# Patient Record
Sex: Male | Born: 1970 | Race: White | Hispanic: No | Marital: Married | Smoking: Current every day smoker
Health system: Southern US, Community
[De-identification: ages and names within clinical notes are randomized; demographics above are authoritative.]

## PROBLEM LIST (undated history)

## (undated) ENCOUNTER — Ambulatory Visit (HOSPITAL_COMMUNITY): Admission: EM | Payer: 59

## (undated) DIAGNOSIS — K859 Acute pancreatitis without necrosis or infection, unspecified: Secondary | ICD-10-CM

## (undated) DIAGNOSIS — E119 Type 2 diabetes mellitus without complications: Secondary | ICD-10-CM

## (undated) DIAGNOSIS — F1020 Alcohol dependence, uncomplicated: Secondary | ICD-10-CM

## (undated) DIAGNOSIS — J449 Chronic obstructive pulmonary disease, unspecified: Secondary | ICD-10-CM

## (undated) DIAGNOSIS — K219 Gastro-esophageal reflux disease without esophagitis: Secondary | ICD-10-CM

## (undated) DIAGNOSIS — I10 Essential (primary) hypertension: Secondary | ICD-10-CM

## (undated) HISTORY — PX: ABDOMINAL SURGERY: SHX537

---

## 2015-09-15 DIAGNOSIS — F10930 Alcohol use, unspecified with withdrawal, uncomplicated: Secondary | ICD-10-CM | POA: Diagnosis present

## 2015-10-16 DIAGNOSIS — I10 Essential (primary) hypertension: Secondary | ICD-10-CM | POA: Insufficient documentation

## 2015-10-24 DIAGNOSIS — K860 Alcohol-induced chronic pancreatitis: Secondary | ICD-10-CM | POA: Diagnosis present

## 2018-01-01 DIAGNOSIS — F1020 Alcohol dependence, uncomplicated: Secondary | ICD-10-CM | POA: Diagnosis present

## 2018-07-04 DIAGNOSIS — F17200 Nicotine dependence, unspecified, uncomplicated: Secondary | ICD-10-CM | POA: Diagnosis present

## 2018-09-29 ENCOUNTER — Emergency Department: Payer: Self-pay

## 2018-09-29 ENCOUNTER — Encounter: Payer: Self-pay | Admitting: Emergency Medicine

## 2018-09-29 ENCOUNTER — Other Ambulatory Visit: Payer: Self-pay

## 2018-09-29 ENCOUNTER — Emergency Department
Admission: EM | Admit: 2018-09-29 | Discharge: 2018-09-29 | Disposition: A | Discharge status: Home / Self Care | Payer: Self-pay | Attending: Emergency Medicine | Admitting: Emergency Medicine

## 2018-09-29 DIAGNOSIS — F17210 Nicotine dependence, cigarettes, uncomplicated: Secondary | ICD-10-CM | POA: Insufficient documentation

## 2018-09-29 DIAGNOSIS — R109 Unspecified abdominal pain: Secondary | ICD-10-CM | POA: Insufficient documentation

## 2018-09-29 DIAGNOSIS — I10 Essential (primary) hypertension: Secondary | ICD-10-CM | POA: Insufficient documentation

## 2018-09-29 HISTORY — DX: Essential (primary) hypertension: I10

## 2018-09-29 HISTORY — DX: Acute pancreatitis without necrosis or infection, unspecified: K85.90

## 2018-09-29 LAB — URINALYSIS, COMPLETE (UACMP) WITH MICROSCOPIC
Bacteria, UA: NONE SEEN
Bilirubin Urine: NEGATIVE
Glucose, UA: NEGATIVE mg/dL
Hgb urine dipstick: NEGATIVE
Ketones, ur: NEGATIVE mg/dL
Leukocytes,Ua: NEGATIVE
Nitrite: NEGATIVE
Protein, ur: NEGATIVE mg/dL
Specific Gravity, Urine: 1.012 (ref 1.005–1.030)
Squamous Epithelial / HPF: NONE SEEN (ref 0–5)
WBC, UA: NONE SEEN WBC/hpf (ref 0–5)
pH: 5 (ref 5.0–8.0)

## 2018-09-29 LAB — COMPREHENSIVE METABOLIC PANEL
ALT: 142 U/L — ABNORMAL HIGH (ref 0–44)
AST: 156 U/L — ABNORMAL HIGH (ref 15–41)
Albumin: 4.3 g/dL (ref 3.5–5.0)
Alkaline Phosphatase: 73 U/L (ref 38–126)
Anion gap: 9 (ref 5–15)
BUN: 7 mg/dL (ref 6–20)
CO2: 23 mmol/L (ref 22–32)
Calcium: 8.6 mg/dL — ABNORMAL LOW (ref 8.9–10.3)
Chloride: 107 mmol/L (ref 98–111)
Creatinine, Ser: 0.72 mg/dL (ref 0.61–1.24)
GFR calc Af Amer: >60 mL/min (ref >60)
GFR calc non Af Amer: >60 mL/min (ref >60)
Glucose, Bld: 122 mg/dL — ABNORMAL HIGH (ref 70–99)
Potassium: 3.7 mmol/L (ref 3.5–5.1)
Sodium: 139 mmol/L (ref 135–145)
Total Bilirubin: 0.7 mg/dL (ref 0.3–1.2)
Total Protein: 7.8 g/dL (ref 6.5–8.1)

## 2018-09-29 LAB — CBC
HCT: 48.2 % (ref 39.0–52.0)
Hemoglobin: 16.8 g/dL (ref 13.0–17.0)
MCH: 31.3 pg (ref 26.0–34.0)
MCHC: 34.9 g/dL (ref 30.0–36.0)
MCV: 89.9 fL (ref 80.0–100.0)
Platelets: 166 10*3/uL (ref 150–400)
RBC: 5.36 MIL/uL (ref 4.22–5.81)
RDW: 15.6 % — ABNORMAL HIGH (ref 11.5–15.5)
WBC: 5.1 10*3/uL (ref 4.0–10.5)
nRBC: 0 % (ref 0.0–0.2)

## 2018-09-29 LAB — LIPASE, BLOOD: Lipase: 49 U/L (ref 11–51)

## 2018-09-29 MED ORDER — IOHEXOL 240 MG/ML SOLN
50.0000 mL | Freq: Once | INTRAMUSCULAR | Status: AC | PRN
  Administered 2018-09-29: 18:00:00 50 mL via ORAL

## 2018-09-29 MED ORDER — HYDROMORPHONE HCL 1 MG/ML IJ SOLN
0.5000 mg | Freq: Once | INTRAMUSCULAR | Status: AC
  Administered 2018-09-29: 20:00:00 0.5 mg via INTRAVENOUS

## 2018-09-29 MED ORDER — HYDROMORPHONE HCL 1 MG/ML IJ SOLN
0.5000 mg | Freq: Once | INTRAMUSCULAR | Status: AC
  Administered 2018-09-29: 20:00:00 0.5 mg via INTRAVENOUS
  Filled 2018-09-29: qty 1

## 2018-09-29 MED ORDER — MORPHINE SULFATE (PF) 4 MG/ML IV SOLN
4.0000 mg | Freq: Once | INTRAVENOUS | Status: AC
  Administered 2018-09-29: 4 mg via INTRAVENOUS
  Filled 2018-09-29: qty 1

## 2018-09-29 MED ORDER — IOHEXOL 300 MG/ML  SOLN
100.0000 mL | Freq: Once | INTRAMUSCULAR | Status: AC | PRN
  Administered 2018-09-29: 19:00:00 100 mL via INTRAVENOUS

## 2018-09-29 MED ORDER — HYDROMORPHONE HCL 1 MG/ML IJ SOLN
INTRAMUSCULAR | Status: AC
  Administered 2018-09-29: 20:00:00 0.5 mg via INTRAVENOUS
  Filled 2018-09-29: qty 1

## 2018-09-29 MED ORDER — SODIUM CHLORIDE 0.9 % IV BOLUS
1000.0000 mL | Freq: Once | INTRAVENOUS | Status: AC
  Administered 2018-09-29: 20:00:00 1000 mL via INTRAVENOUS

## 2018-09-29 MED ORDER — OXYCODONE-ACETAMINOPHEN 5-325 MG PO TABS: 1.0000 | ORAL_TABLET | ORAL | 0 refills | Status: DC | PRN

## 2018-09-29 MED ORDER — ONDANSETRON HCL 4 MG/2ML IJ SOLN
4.0000 mg | Freq: Once | INTRAMUSCULAR | Status: AC
  Administered 2018-09-29: 18:00:00 4 mg via INTRAVENOUS
  Filled 2018-09-29: qty 2

## 2018-09-29 NOTE — ED Triage Notes (Signed)
Pt in via EMS from home with c/o syncopal episode with diaphoresis. Pt also with LUQ abd pain and hx of pancreatitis. EMS reports pt is on complaint with his home mediations. NKDA

## 2018-09-29 NOTE — ED Triage Notes (Addendum)
Patient presents to the ED with abdominal pian x 2 days.  Patient reports vomiting x 4 and diarrhea x 3 in the past 24 hours.  Patient reports syncopal episode today.  Patient reports history of pancreatitis and reports drinking alcohol over the weekend.  When asked how much alcohol patient had patient states, "More than I should have, probably."

## 2018-09-29 NOTE — Discharge Instructions (Addendum)
Please return for worse pain, fever or vomiting.  Also return if you have frequent diarrhea or especially if you have bloody diarrhea.  Please try to get a primary care doctor.  You can follow-up with the [facility], the [facility], the [facility] clinic, [facility] or [facility].  [facility] is free if you qualify.  All of these clinics have a lot of paperwork to fill out and they often have long waits.  There is also a charity care clinic at [facility] that you can go and get into his lungs are state resident and [facility] has a residents clinic with the doctors in training are completely supervised by fully trained doctors but will see you for reduced cost.  I have given you some Percocet to take if needed for pain 1 3 times a day.  I only gave you 3 because if you get worse I want you to come back.

## 2018-09-29 NOTE — ED Provider Notes (Signed)
[facility] Emergency Department Provider Note   ____________________________________________   First MD Initiated Contact with Patient 09/29/18 1753     (approximate)  I have reviewed the triage vital signs and the nursing notes.   HISTORY  Chief Complaint Abdominal Pain   HPI Kristopher [name] is a 48 y.o. male who reports about 2 days of abdominal pain.  He has had some episodes of vomiting and diarrhea.  He told the nurse it was 4 episodes of vomiting and 3 of diarrhea in the last 24 hours.  He is complaining of a lot of abdominal pain.  He says he has been drinking a lot and has pancreatitis and was afraid that he had pancreatitis again.  He does drink every day.  He had been dry for some time but recently started back up again.  He is only been drinking for 3 weeks this time.  He says when he has withdrawal he has bad shakes but really does not have DTs or seizures.  Pain is deep and achy.  Somewhat worse with palpation.  Patient reports he started his antihypertensive medicine which she thinks maybe Maxide.  He started it this morning.  A he had left it with his brother-in-law I believe previously.        Past Medical History:  Diagnosis Date  . Hypertension   . Pancreatitis     There are no active problems to display for this patient.   History reviewed. No pertinent surgical history.  Prior to Admission medications   Medication Sig Start Date End Date Taking? Authorizing Provider  oxyCODONE-acetaminophen (PERCOCET) 5-325 MG tablet Take 1 tablet by mouth every 4 (four) hours as needed for severe pain. 09/29/18 09/29/19  Arnaldo NatalMalinda, Paul F, MD    Allergies Patient has no known allergies.  No family history on file.  Social History Social History   Tobacco Use  . Smoking status: Current Every Day Smoker    Packs/day: 0.50    Types: Cigarettes  . Smokeless tobacco: Never Used  Substance Use Topics  . Alcohol use: Yes  . Drug use: Not on  file    Review of Systems  Constitutional: No fever/chills Eyes: No visual changes. ENT: No sore throat. Cardiovascular: Denies chest pain. Respiratory: Denies shortness of breath. Gastrointestinal: See HPI Genitourinary: Negative for dysuria. Musculoskeletal: Negative for back pain. Skin: Negative for rash. Neurological: Negative for headaches, focal weakness   ____________________________________________   PHYSICAL EXAM:  VITAL SIGNS: ED Triage Vitals  Enc Vitals Group     BP 09/29/18 1442 115/75     Pulse Rate 09/29/18 1442 91     Resp 09/29/18 1442 16     Temp 09/29/18 1442 98.5 F (36.9 C)     Temp Source 09/29/18 1442 Oral     SpO2 09/29/18 1442 93 %     Weight 09/29/18 1443 240 lb (108.9 kg)     Height 09/29/18 1443 5\' 11"  (1.803 m)     Head Circumference --      Peak Flow --      Pain Score 09/29/18 1443 10     Pain Loc --      Pain Edu? --      Excl. in GC? --     Constitutional: Alert and oriented. Well appearing and in no acute distress. Eyes: Conjunctivae are normal.  Head: Atraumatic. Nose: No congestion/rhinnorhea. Mouth/Throat: Mucous membranes are moist.  Oropharynx non-erythematous. Neck: No stridor Cardiovascular: Normal rate, regular rhythm.  Grossly normal heart sounds.  Good peripheral circulation. Respiratory: Normal respiratory effort.  No retractions. Lungs CTAB. Gastrointestinal: Somewhat distended, firm, diffusely mildly tender. No abdominal bruits. No CVA tenderness. Musculoskeletal: No lower extremity tenderness nor edema.  Neurologic:  Normal speech and language. No gross focal neurologic deficits are appreciated.  Skin:  Skin is warm, dry and intact. No rash noted patient is diffusely flushed though.   ____________________________________________   LABS (all labs ordered are listed, but only abnormal results are displayed)  Labs Reviewed  COMPREHENSIVE METABOLIC PANEL - Abnormal; Notable for the following components:       Result Value   Glucose, Bld 122 (*)    Calcium 8.6 (*)    AST 156 (*)    ALT 142 (*)    All other components within normal limits  CBC - Abnormal; Notable for the following components:   RDW 15.6 (*)    All other components within normal limits  URINALYSIS, COMPLETE (UACMP) WITH MICROSCOPIC - Abnormal; Notable for the following components:   Color, Urine YELLOW (*)    APPearance CLEAR (*)    All other components within normal limits  GASTROINTESTINAL PANEL BY PCR, STOOL (REPLACES STOOL CULTURE)  C DIFFICILE QUICK SCREEN W PCR REFLEX  LIPASE, BLOOD   ____________________________________________  EKG  ____________________________________________  RADIOLOGY  ED MD interpretation: CT read by radiology reviewed by me does not show any acute problems  Official radiology report(s): Ct Abdomen Pelvis W Contrast  Result Date: 09/29/2018 CLINICAL DATA:  Abdominal pain with vomiting and diarrhea EXAM: CT ABDOMEN AND PELVIS WITH CONTRAST TECHNIQUE: Multidetector CT imaging of the abdomen and pelvis was performed using the standard protocol following bolus administration of intravenous contrast. Oral contrast was also administered. 1 CONTRAST:  131mL OMNIPAQUE IOHEXOL 300 MG/ML SOLN, 15mL OMNIPAQUE IOHEXOL 240 MG/ML SOLN COMPARISON:  None. FINDINGS: Lower chest: Lung bases are clear. Hepatobiliary: There is diffuse decreased attenuation in the liver consistent with hepatic steatosis. No focal liver lesions are evident. Gallbladder wall is not appreciably thickened. There is no biliary duct dilatation. Pancreas: There is no pancreatic mass or inflammatory focus. Spleen: No splenic lesions are evident. Adrenals/Urinary Tract: Adrenals bilaterally appear unremarkable. Kidneys bilaterally show no evident mass or hydronephrosis on either side. There is no evident renal or ureteral calculus on either side. Urinary bladder is midline with wall thickness within normal limits. Stomach/Bowel: There is no  appreciable bowel wall or mesenteric thickening. No evident bowel obstruction. Terminal ileum appears unremarkable. There is no free air or portal venous air. Vascular/Lymphatic: There is no abdominal aortic aneurysm. No vascular lesions are demonstrable. No adenopathy is appreciable in the abdomen or pelvis. Reproductive: Prostate and seminal vesicles are normal in size and contour. No evident pelvic mass. Other: Appendix appears normal. No abscess or ascites evident in the abdomen or pelvis. Musculoskeletal: There are no appreciable blastic or lytic bone lesions. There is no evident intramuscular lesion. There is thinning of the rectus muscle at the umbilical level. IMPRESSION: 1.  Diffuse hepatic steatosis. 2. No evident bowel obstruction. No abscess in the abdomen or pelvis. Appendix appears normal. 3. No evident renal or ureteral calculus. No hydronephrosis. Urinary bladder wall thickness normal. Comment: A cause for patient's symptoms has not been established with this study. Electronically Signed   By: Lowella Grip III M.D.   On: 09/29/2018 18:56    ____________________________________________   PROCEDURES  Procedure(s) performed (including Critical Care):  Procedures   ____________________________________________   INITIAL IMPRESSION / ASSESSMENT AND  PLAN / ED COURSE  I cannot find any cause for the patient's abdominal pain.  He is anxious to go home before an apparent oncoming storm breaks.  We will let him go.  He knows to return if he is worse.  He will be given a prescription for 3 Percocets if he needs it.Kristopher [name] was evaluated in Emergency Department on 09/29/2018 for the symptoms described in the history of present illness. He was evaluated in the context of the global COVID-19 pandemic, which necessitated consideration that the patient might be at risk for infection with the SARS-CoV-2 virus that causes COVID-19. Institutional protocols and algorithms that pertain to the  evaluation of patients at risk for COVID-19 are in a state of rapid change based on information released by regulatory bodies including the CDC and federal and state organizations. These policies and algorithms were followed during the patient's care in the ED.    He is going to [facility] already.           ____________________________________________   FINAL CLINICAL IMPRESSION(S) / ED DIAGNOSES  Final diagnoses:  Abdominal pain, unspecified abdominal location     ED Discharge Orders         Ordered    oxyCODONE-acetaminophen (PERCOCET) 5-325 MG tablet  Every 4 hours PRN     09/29/18 2100           Note:  This document was prepared using Dragon voice recognition software and may include unintentional dictation errors.    Arnaldo NatalMalinda, Paul F, MD 09/29/18 2111

## 2018-10-27 ENCOUNTER — Encounter: Payer: Self-pay | Admitting: Emergency Medicine

## 2018-10-27 ENCOUNTER — Other Ambulatory Visit: Payer: Self-pay

## 2018-10-27 DIAGNOSIS — R1110 Vomiting, unspecified: Secondary | ICD-10-CM | POA: Insufficient documentation

## 2018-10-27 DIAGNOSIS — Z5321 Procedure and treatment not carried out due to patient leaving prior to being seen by health care provider: Secondary | ICD-10-CM | POA: Insufficient documentation

## 2018-10-27 LAB — URINALYSIS, COMPLETE (UACMP) WITH MICROSCOPIC
Bacteria, UA: NONE SEEN
Bilirubin Urine: NEGATIVE
Glucose, UA: NEGATIVE mg/dL
Ketones, ur: NEGATIVE mg/dL
Nitrite: NEGATIVE
Protein, ur: NEGATIVE mg/dL
Specific Gravity, Urine: 1.001 — ABNORMAL LOW (ref 1.005–1.030)
Squamous Epithelial / HPF: NONE SEEN (ref 0–5)
pH: 6 (ref 5.0–8.0)

## 2018-10-27 LAB — CBC
HCT: 48.5 % (ref 39.0–52.0)
Hemoglobin: 16.5 g/dL (ref 13.0–17.0)
MCH: 31.8 pg (ref 26.0–34.0)
MCHC: 34 g/dL (ref 30.0–36.0)
MCV: 93.4 fL (ref 80.0–100.0)
Platelets: 228 10*3/uL (ref 150–400)
RBC: 5.19 MIL/uL (ref 4.22–5.81)
RDW: 14.9 % (ref 11.5–15.5)
WBC: 6.6 10*3/uL (ref 4.0–10.5)
nRBC: 0 % (ref 0.0–0.2)

## 2018-10-27 LAB — COMPREHENSIVE METABOLIC PANEL
ALT: 131 U/L — ABNORMAL HIGH (ref 0–44)
AST: 110 U/L — ABNORMAL HIGH (ref 15–41)
Albumin: 4.3 g/dL (ref 3.5–5.0)
Alkaline Phosphatase: 67 U/L (ref 38–126)
Anion gap: 11 (ref 5–15)
BUN: 12 mg/dL (ref 6–20)
CO2: 23 mmol/L (ref 22–32)
Calcium: 8.8 mg/dL — ABNORMAL LOW (ref 8.9–10.3)
Chloride: 104 mmol/L (ref 98–111)
Creatinine, Ser: 0.75 mg/dL (ref 0.61–1.24)
GFR calc Af Amer: >60 mL/min (ref >60)
GFR calc non Af Amer: >60 mL/min (ref >60)
Glucose, Bld: 110 mg/dL — ABNORMAL HIGH (ref 70–99)
Potassium: 3.7 mmol/L (ref 3.5–5.1)
Sodium: 138 mmol/L (ref 135–145)
Total Bilirubin: 0.5 mg/dL (ref 0.3–1.2)
Total Protein: 7.4 g/dL (ref 6.5–8.1)

## 2018-10-27 LAB — LIPASE, BLOOD: Lipase: 60 U/L — ABNORMAL HIGH (ref 11–51)

## 2018-10-27 NOTE — ED Triage Notes (Addendum)
Pt presents to ED via EMS with upper abd pain that radiates around to his back for the past 5 hours. Pt states "his  Pancreas is on fire". Hx of the same. +Vomiting

## 2018-10-28 ENCOUNTER — Emergency Department
Admission: EM | Admit: 2018-10-28 | Discharge: 2018-10-28 | Discharge status: Against Medical Advice | Payer: Self-pay | Attending: Emergency Medicine | Admitting: Emergency Medicine

## 2018-10-28 ENCOUNTER — Telehealth: Payer: Self-pay | Admitting: Emergency Medicine

## 2018-10-28 HISTORY — DX: Gastro-esophageal reflux disease without esophagitis: K21.9

## 2018-10-28 LAB — TROPONIN I (HIGH SENSITIVITY): Troponin I (High Sensitivity): 4 ng/L (ref <18)

## 2018-10-28 NOTE — ED Notes (Signed)
Pt noted leaving ED lobby, getting into vehicle and leaving  

## 2018-10-28 NOTE — Telephone Encounter (Signed)
Called patient due to lwot to inquire about condition and follow up plans. Says he continues to have pain.  He does not have pcp, but he does have [facility] care at [facility].  He also said that he has black stools with blood when he wipes. I stresed the importance of provider exam to determine cause of abdominal pain.  He says he will come back or go to [facility] [place] after he speaks with his wife.

## 2018-10-29 ENCOUNTER — Emergency Department
Admission: EM | Admit: 2018-10-29 | Discharge: 2018-10-29 | Disposition: A | Discharge status: Home / Self Care | Payer: Self-pay | Attending: Emergency Medicine | Admitting: Emergency Medicine

## 2018-10-29 ENCOUNTER — Other Ambulatory Visit: Payer: Self-pay

## 2018-10-29 ENCOUNTER — Encounter: Payer: Self-pay | Admitting: *Deleted

## 2018-10-29 DIAGNOSIS — R1010 Upper abdominal pain, unspecified: Secondary | ICD-10-CM

## 2018-10-29 DIAGNOSIS — K2900 Acute gastritis without bleeding: Secondary | ICD-10-CM | POA: Insufficient documentation

## 2018-10-29 DIAGNOSIS — F10920 Alcohol use, unspecified with intoxication, uncomplicated: Secondary | ICD-10-CM | POA: Insufficient documentation

## 2018-10-29 LAB — CBC WITH DIFFERENTIAL/PLATELET
Abs Immature Granulocytes: 0.02 10*3/uL (ref 0.00–0.07)
Basophils Absolute: 0.1 10*3/uL (ref 0.0–0.1)
Basophils Relative: 1 %
Eosinophils Absolute: 0.2 10*3/uL (ref 0.0–0.5)
Eosinophils Relative: 2 %
HCT: 48.2 % (ref 39.0–52.0)
Hemoglobin: 16.6 g/dL (ref 13.0–17.0)
Immature Granulocytes: 0 %
Lymphocytes Relative: 49 %
Lymphs Abs: 3.2 10*3/uL (ref 0.7–4.0)
MCH: 31.6 pg (ref 26.0–34.0)
MCHC: 34.4 g/dL (ref 30.0–36.0)
MCV: 91.8 fL (ref 80.0–100.0)
Monocytes Absolute: 0.4 10*3/uL (ref 0.1–1.0)
Monocytes Relative: 6 %
Neutro Abs: 2.8 10*3/uL (ref 1.7–7.7)
Neutrophils Relative %: 42 %
Platelets: 241 10*3/uL (ref 150–400)
RBC: 5.25 MIL/uL (ref 4.22–5.81)
RDW: 14.8 % (ref 11.5–15.5)
WBC: 6.7 10*3/uL (ref 4.0–10.5)
nRBC: 0 % (ref 0.0–0.2)

## 2018-10-29 LAB — COMPREHENSIVE METABOLIC PANEL
ALT: 136 U/L — ABNORMAL HIGH (ref 0–44)
AST: 105 U/L — ABNORMAL HIGH (ref 15–41)
Albumin: 4.2 g/dL (ref 3.5–5.0)
Alkaline Phosphatase: 63 U/L (ref 38–126)
Anion gap: 9 (ref 5–15)
BUN: 8 mg/dL (ref 6–20)
CO2: 25 mmol/L (ref 22–32)
Calcium: 8.6 mg/dL — ABNORMAL LOW (ref 8.9–10.3)
Chloride: 104 mmol/L (ref 98–111)
Creatinine, Ser: 0.73 mg/dL (ref 0.61–1.24)
GFR calc Af Amer: >60 mL/min (ref >60)
GFR calc non Af Amer: >60 mL/min (ref >60)
Glucose, Bld: 105 mg/dL — ABNORMAL HIGH (ref 70–99)
Potassium: 3.9 mmol/L (ref 3.5–5.1)
Sodium: 138 mmol/L (ref 135–145)
Total Bilirubin: 0.6 mg/dL (ref 0.3–1.2)
Total Protein: 7.2 g/dL (ref 6.5–8.1)

## 2018-10-29 LAB — URINALYSIS, COMPLETE (UACMP) WITH MICROSCOPIC
Bilirubin Urine: NEGATIVE
Glucose, UA: NEGATIVE mg/dL
Ketones, ur: NEGATIVE mg/dL
Leukocytes,Ua: NEGATIVE
Nitrite: NEGATIVE
Protein, ur: NEGATIVE mg/dL
Specific Gravity, Urine: 1.003 — ABNORMAL LOW (ref 1.005–1.030)
pH: 5 (ref 5.0–8.0)

## 2018-10-29 LAB — ETHANOL: Alcohol, Ethyl (B): 144 mg/dL — ABNORMAL HIGH (ref <10)

## 2018-10-29 LAB — LIPASE, BLOOD: Lipase: 46 U/L (ref 11–51)

## 2018-10-29 LAB — TROPONIN I (HIGH SENSITIVITY): Troponin I (High Sensitivity): 5 ng/L (ref <18)

## 2018-10-29 MED ORDER — FAMOTIDINE 20 MG PO TABS: 20.0000 mg | ORAL_TABLET | Freq: Two times a day (BID) | ORAL | 0 refills | Status: DC

## 2018-10-29 MED ORDER — ONDANSETRON 4 MG PO TBDP: 4.0000 mg | ORAL_TABLET | Freq: Three times a day (TID) | ORAL | 0 refills | Status: DC | PRN

## 2018-10-29 MED ORDER — FAMOTIDINE IN NACL 20-0.9 MG/50ML-% IV SOLN
20.0000 mg | Freq: Once | INTRAVENOUS | Status: AC
  Administered 2018-10-29: 20 mg via INTRAVENOUS
  Filled 2018-10-29: qty 50

## 2018-10-29 MED ORDER — SODIUM CHLORIDE 0.9 % IV BOLUS
1000.0000 mL | Freq: Once | INTRAVENOUS | Status: AC
  Administered 2018-10-29: 02:00:00 1000 mL via INTRAVENOUS

## 2018-10-29 MED ORDER — OXYCODONE HCL 5 MG PO TABS: 5.0000 mg | ORAL_TABLET | ORAL | 0 refills | Status: DC | PRN

## 2018-10-29 MED ORDER — ONDANSETRON HCL 4 MG/2ML IJ SOLN
4.0000 mg | Freq: Once | INTRAMUSCULAR | Status: AC
  Administered 2018-10-29: 4 mg via INTRAVENOUS
  Filled 2018-10-29: qty 2

## 2018-10-29 MED ORDER — MORPHINE SULFATE (PF) 4 MG/ML IV SOLN
4.0000 mg | Freq: Once | INTRAVENOUS | Status: AC
  Administered 2018-10-29: 4 mg via INTRAVENOUS
  Filled 2018-10-29: qty 1

## 2018-10-29 MED ORDER — HYDROMORPHONE HCL 1 MG/ML IJ SOLN
1.0000 mg | Freq: Once | INTRAMUSCULAR | Status: AC
  Administered 2018-10-29: 02:00:00 1 mg via INTRAVENOUS
  Filled 2018-10-29: qty 1

## 2018-10-29 MED ORDER — OXYCODONE HCL 5 MG PO TABS
5.0000 mg | ORAL_TABLET | Freq: Once | ORAL | Status: AC
  Administered 2018-10-29: 5 mg via ORAL
  Filled 2018-10-29: qty 1

## 2018-10-29 NOTE — ED Provider Notes (Signed)
[facility] Emergency Department Provider Note   ____________________________________________   First MD Initiated Contact with Patient 10/29/18 [phone]     (approximate)  I have reviewed the triage vital signs and the nursing notes.   HISTORY  Chief Complaint Abdominal Pain    HPI Kristopher [name] is a 48 y.o. male brought to the ED via EMS from a local hotel with a chief complaint of abdominal pain.  Patient has a history of recurrent pancreatitis.  Left without being seen 8/3 and was called by nurse to return for elevated lipase.  Complains of upper abdominal pain for the past several days.  Has had bright red blood per rectum.  Admits to drinking alcohol.  Denies fever, cough, chest pain, shortness of breath, nausea, vomiting.  Last bowel movement prior to arrival.  Denies recent travel, trauma or exposure to persons diagnosed with coronavirus.       Past Medical History:  Diagnosis Date  . GERD (gastroesophageal reflux disease)   . Hypertension   . Pancreatitis     There are no active problems to display for this patient.   Past Surgical History:  Procedure Laterality Date  . ABDOMINAL SURGERY      Prior to Admission medications   Medication Sig Start Date End Date Taking? Authorizing Provider  famotidine (PEPCID) 20 MG tablet Take 1 tablet (20 mg total) by mouth 2 (two) times daily. 10/29/18   Irean HongSung, Ayaan Ringle J, MD  ondansetron (ZOFRAN ODT) 4 MG disintegrating tablet Take 1 tablet (4 mg total) by mouth every 8 (eight) hours as needed for nausea or vomiting. 10/29/18   Irean HongSung, Dream Harman J, MD  oxyCODONE (ROXICODONE) 5 MG immediate release tablet Take 1 tablet (5 mg total) by mouth every 4 (four) hours as needed for severe pain. 10/29/18   Irean HongSung, Kwesi Sangha J, MD  oxyCODONE-acetaminophen (PERCOCET) 5-325 MG tablet Take 1 tablet by mouth every 4 (four) hours as needed for severe pain. 09/29/18 09/29/19  Arnaldo NatalMalinda, Paul F, MD    Allergies Patient has no known allergies.   No family history on file.  Social History Social History   Tobacco Use  . Smoking status: Current Every Day Smoker    Packs/day: 1.00    Types: Cigarettes  . Smokeless tobacco: Never Used  Substance Use Topics  . Alcohol use: Yes  . Drug use: Not Currently    Types: Marijuana    Review of Systems  Constitutional: No fever/chills Eyes: No visual changes. ENT: No sore throat. Cardiovascular: Denies chest pain. Respiratory: Denies shortness of breath. Gastrointestinal: Positive for abdominal pain.  No nausea, no vomiting.  No diarrhea.  No constipation.  Positive for bright red blood per rectum. Genitourinary: Negative for dysuria. Musculoskeletal: Negative for back pain. Skin: Negative for rash. Neurological: Negative for headaches, focal weakness or numbness.   ____________________________________________   PHYSICAL EXAM:  VITAL SIGNS: ED Triage Vitals  Enc Vitals Group     BP 10/29/18 0039 (!) 158/111     Pulse Rate 10/29/18 0039 88     Resp 10/29/18 0039 18     Temp 10/29/18 0039 97.8 F (36.6 C)     Temp src --      SpO2 10/29/18 0042 97 %     Weight 10/29/18 0040 220 lb 0.3 oz (99.8 kg)     Height 10/29/18 0040 5\' 11"  (1.803 m)     Head Circumference --      Peak Flow --  Pain Score 10/29/18 0039 10     Pain Loc --      Pain Edu? --      Excl. in GC? --     Constitutional: Alert and oriented. Well appearing and in mild acute distress. Eyes: Conjunctivae are normal. PERRL. EOMI. Head: Atraumatic. Nose: No congestion/rhinnorhea. Mouth/Throat: Mucous membranes are moist.  Oropharynx non-erythematous. Neck: No stridor.   Cardiovascular: Normal rate, regular rhythm. Grossly normal heart sounds.  Good peripheral circulation. Respiratory: Normal respiratory effort.  No retractions. Lungs CTAB. Gastrointestinal: Soft and mildly tender to palpation epigastrium without rebound or guarding. No distention. No abdominal bruits. No CVA tenderness.  Musculoskeletal: No lower extremity tenderness nor edema.  No joint effusions. Neurologic:  Normal speech and language. No gross focal neurologic deficits are appreciated. No gait instability. Skin:  Skin is warm, dry and intact. No rash noted. Psychiatric: Mood and affect are normal. Speech and behavior are normal.  ____________________________________________   LABS (all labs ordered are listed, but only abnormal results are displayed)  Labs Reviewed  COMPREHENSIVE METABOLIC PANEL - Abnormal; Notable for the following components:      Result Value   Glucose, Bld 105 (*)    Calcium 8.6 (*)    AST 105 (*)    ALT 136 (*)    All other components within normal limits  ETHANOL - Abnormal; Notable for the following components:   Alcohol, Ethyl (B) 144 (*)    All other components within normal limits  URINALYSIS, COMPLETE (UACMP) WITH MICROSCOPIC - Abnormal; Notable for the following components:   Color, Urine STRAW (*)    APPearance CLEAR (*)    Specific Gravity, Urine 1.003 (*)    Hgb urine dipstick SMALL (*)    Bacteria, UA RARE (*)    All other components within normal limits  CBC WITH DIFFERENTIAL/PLATELET  LIPASE, BLOOD   ____________________________________________  EKG  ED ECG REPORT I, Jeriann Sayres J, the attending physician, personally viewed and interpreted this ECG.   Date: 10/29/2018  EKG Time: 0043  Rate: 79  Rhythm: normal EKG, normal sinus rhythm  Axis: Normal  Intervals:none  ST&T Change: Nonspecific  ____________________________________________  RADIOLOGY  ED MD interpretation: None; reviewed CT report from 2 weeks ago which was unremarkable  Official radiology report(s): No results found.  ____________________________________________   PROCEDURES  Procedure(s) performed (including Critical Care):  Procedures  Rectal exam: External exam reveals small reducible hemorrhoid.  Tan stool on gloved finger with bright red blood streak.  ____________________________________________   INITIAL IMPRESSION / ASSESSMENT AND PLAN / ED COURSE  As part of my medical decision making, I reviewed the following data within the electronic MEDICAL RECORD NUMBER Nursing notes reviewed and incorporated, Labs reviewed, EKG interpreted, Old chart reviewed and Notes from prior ED visits     Kristopher [name] was evaluated in Emergency Department on 10/29/2018 for the symptoms described in the history of present illness. He was evaluated in the context of the global COVID-19 pandemic, which necessitated consideration that the patient might be at risk for infection with the SARS-CoV-2 virus that causes COVID-19. Institutional protocols and algorithms that pertain to the evaluation of patients at risk for COVID-19 are in a state of rapid change based on information released by regulatory bodies including the CDC and federal and state organizations. These policies and algorithms were followed during the patient's care in the ED.   48 year old male who presents with upper abdominal pain; history of pancreatitis. Differential diagnosis includes, but is not limited  to, biliary disease (biliary colic, acute cholecystitis, cholangitis, choledocholithiasis, etc), intrathoracic causes for epigastric abdominal pain including ACS, gastritis, duodenitis, pancreatitis, small bowel or large bowel obstruction, abdominal aortic aneurysm, hernia, and ulcer(s).  Will obtain lab work, initiate IV fluid resuscitation.  Administer IV Dilaudid for pain, IV Zofran for nausea and IV Pepcid.  Will reassess.   Clinical Course as of Oct 29 342  Wed Oct 29, 2018  0301 Elevated transaminases which are baseline.  Lipase has improved.  Will discharge home with strict diet orders, limited prescription of Percocet and patient will follow-up with his PCP closely.  Strict return precautions given.  Patient verbalizes understanding agrees with plan of care.   [JS]    Clinical Course User  Index [JS] Paulette Blanch, MD     ____________________________________________   FINAL CLINICAL IMPRESSION(S) / ED DIAGNOSES  Final diagnoses:  Pain of upper abdomen  Alcoholic intoxication without complication (HCC)  Acute gastritis without hemorrhage, unspecified gastritis type     ED Discharge Orders         Ordered    ondansetron (ZOFRAN ODT) 4 MG disintegrating tablet  Every 8 hours PRN     10/29/18 0303    oxyCODONE (ROXICODONE) 5 MG immediate release tablet  Every 4 hours PRN     10/29/18 0303    famotidine (PEPCID) 20 MG tablet  2 times daily     10/29/18 0304           Note:  This document was prepared using Dragon voice recognition software and may include unintentional dictation errors.   Paulette Blanch, MD 10/29/18 [phone]

## 2018-10-29 NOTE — ED Triage Notes (Signed)
Per EMS and pt he left AMA yesterday because the wait was too long. He was called at noon to return but he states he didn't have a ride back and then he went home and went to bed. Pt stays at [facility]. He said he woke up and the pain was worse. He also states he has recently been having rectal bleeding.

## 2018-10-29 NOTE — Discharge Instructions (Addendum)
1.  Start Pepcid 20 mg twice daily. 2.  You may take medicines as needed for pain and nausea (Oxycodone/Zofran). 3.  Bland diet x1 week, then slowly advance diet as tolerated. 4.  Avoid alcohol, spicy foods, NSAIDs such as ibuprofen and aspirin. 5.  Return to the ER for worsening symptoms, persistent vomiting, difficulty breathing or other concerns.

## 2018-11-16 ENCOUNTER — Other Ambulatory Visit: Payer: Self-pay

## 2018-11-16 ENCOUNTER — Emergency Department
Admission: EM | Admit: 2018-11-16 | Discharge: 2018-11-16 | Disposition: A | Discharge status: Home / Self Care | Payer: Self-pay | Attending: Emergency Medicine | Admitting: Emergency Medicine

## 2018-11-16 DIAGNOSIS — Y929 Unspecified place or not applicable: Secondary | ICD-10-CM | POA: Insufficient documentation

## 2018-11-16 DIAGNOSIS — Y999 Unspecified external cause status: Secondary | ICD-10-CM | POA: Insufficient documentation

## 2018-11-16 DIAGNOSIS — S61011A Laceration without foreign body of right thumb without damage to nail, initial encounter: Secondary | ICD-10-CM | POA: Insufficient documentation

## 2018-11-16 DIAGNOSIS — Z79899 Other long term (current) drug therapy: Secondary | ICD-10-CM | POA: Insufficient documentation

## 2018-11-16 DIAGNOSIS — W260XXA Contact with knife, initial encounter: Secondary | ICD-10-CM | POA: Insufficient documentation

## 2018-11-16 DIAGNOSIS — I10 Essential (primary) hypertension: Secondary | ICD-10-CM | POA: Insufficient documentation

## 2018-11-16 DIAGNOSIS — Y939 Activity, unspecified: Secondary | ICD-10-CM | POA: Insufficient documentation

## 2018-11-16 DIAGNOSIS — F17210 Nicotine dependence, cigarettes, uncomplicated: Secondary | ICD-10-CM | POA: Insufficient documentation

## 2018-11-16 MED ORDER — LIDOCAINE HCL 1 % IJ SOLN
5.0000 mL | Freq: Once | INTRAMUSCULAR | Status: DC
  Filled 2018-11-16: qty 10

## 2018-11-16 MED ORDER — CEPHALEXIN 500 MG PO CAPS: 500.0000 mg | ORAL_CAPSULE | Freq: Three times a day (TID) | ORAL | 0 refills | Status: AC

## 2018-11-16 NOTE — ED Notes (Signed)
No peripheral IV placed this visit.   Discharge instructions reviewed with patient. Questions fielded by this RN. Patient verbalizes understanding of instructions. Patient discharged home in stable condition per Jackie, PA. No acute distress noted at time of discharge.   

## 2018-11-16 NOTE — ED Triage Notes (Signed)
Patient with laceration to right thumb from pocket knife while trying to open a large container. Bleeding controlled with pressure dressing.

## 2018-11-16 NOTE — ED Provider Notes (Signed)
[facility] Emergency Department Provider Note  ____________________________________________  Time seen: Approximately 11:14 PM  I have reviewed the triage vital signs and the nursing notes.   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  Patient denies numbness or tingling in the right thumb.  He reports that his tetanus status is up-to-date.  No other alleviating measures have been attempted.        Past Medical History:  Diagnosis Date  . GERD (gastroesophageal reflux disease)   . Hypertension   . Pancreatitis     There are no active problems to display for this patient.   Past Surgical History:  Procedure Laterality Date  . ABDOMINAL SURGERY      Prior to Admission medications   Medication Sig Start Date End Date Taking? Authorizing Provider  cephALEXin (KEFLEX) 500 MG capsule Take 1 capsule (500 mg total) by mouth 3 (three) times daily for 7 days. 11/16/18 11/23/18  Orvil FeilWoods, Jaclyn M, PA-C  famotidine (PEPCID) 20 MG tablet Take 1 tablet (20 mg total) by mouth 2 (two) times daily. 10/29/18   Irean HongSung, Jade J, MD  ondansetron (ZOFRAN ODT) 4 MG disintegrating tablet Take 1 tablet (4 mg total) by mouth every 8 (eight) hours as needed for nausea or vomiting. 10/29/18   Irean HongSung, Jade J, MD  oxyCODONE (ROXICODONE) 5 MG immediate release tablet Take 1 tablet (5 mg total) by mouth every 4 (four) hours as needed for severe pain. 10/29/18   Irean HongSung, Jade J, MD  oxyCODONE-acetaminophen (PERCOCET) 5-325 MG tablet Take 1 tablet by mouth every 4 (four) hours as needed for severe pain. 09/29/18 09/29/19  Arnaldo NatalMalinda, Paul F, MD    Allergies Patient has no known allergies.  No family history on file.  Social History Social History   Tobacco Use  . Smoking status: Current Every Day Smoker     Packs/day: 1.00    Types: Cigarettes  . Smokeless tobacco: Never Used  Substance Use Topics  . Alcohol use: Yes  . Drug use: Not Currently    Types: Marijuana     Review of Systems  Constitutional: No fever/chills Eyes: No visual changes. No discharge ENT: No upper respiratory complaints. Cardiovascular: no chest pain. Respiratory: no cough. No SOB. Gastrointestinal: No abdominal pain.  No nausea, no vomiting.  No diarrhea.  No constipation. Musculoskeletal: Negative for musculoskeletal pain. Skin: Patient has right thumb laceration.  Neurological: Negative for headaches, focal weakness or numbness.   ____________________________________________   PHYSICAL EXAM:  VITAL SIGNS: ED Triage Vitals  Enc Vitals Group     BP 11/16/18 2216 135/89     Pulse Rate 11/16/18 2216 97     Resp 11/16/18 2216 20     Temp 11/16/18 2216 98.1 F (36.7 C)     Temp Source 11/16/18 2216 Oral     SpO2 11/16/18 2216 95 %     Weight 11/16/18 2217 220 lb (99.8 kg)     Height 11/16/18 2217 5\' 11"  (1.803 m)     Head Circumference --      Peak Flow --      Pain Score 11/16/18 2216 8     Pain Loc --      Pain Edu? --      Excl. in GC? --      Constitutional: Alert  and oriented. Well appearing and in no acute distress. Eyes: Conjunctivae are normal. PERRL. EOMI. Head: Atraumatic. Cardiovascular: Normal rate, regular rhythm. Normal S1 and S2.  Good peripheral circulation. Respiratory: Normal respiratory effort without tachypnea or retractions. Lungs CTAB. Good air entry to the bases with no decreased or absent breath sounds. Musculoskeletal: Full range of motion to all extremities. No gross deformities appreciated. Neurologic:  Normal speech and language. No gross focal neurologic deficits are appreciated.  Skin: Patient has a V-shaped laceration of right thumb. Psychiatric: Mood and affect are normal. Speech and behavior are normal. Patient exhibits appropriate insight and  judgement.   ____________________________________________   LABS (all labs ordered are listed, but only abnormal results are displayed)  Labs Reviewed - No data to display ____________________________________________  EKG   ____________________________________________  RADIOLOGY   No results found.  ____________________________________________    PROCEDURES  Procedure(s) performed:    Procedures  LACERATION REPAIR Performed by: Lannie Fields Authorized by: Lannie Fields Consent: Verbal consent obtained. Risks and benefits: risks, benefits and alternatives were discussed Consent given by: patient Patient identity confirmed: provided demographic data Prepped and Draped in normal sterile fashion Wound explored  Laceration Location: Right thumb   Laceration Length: 2.5 cm  No Foreign Bodies seen or palpated  Anesthesia: local infiltration  Local anesthetic: lidocaine 1% without epinephrine  Anesthetic total: 2 ml  Irrigation method: syringe Amount of cleaning: standard  Skin closure: 4-0 Ethilon   Number of sutures: 5  Technique: Simple Interrupted   Patient tolerance: Patient tolerated the procedure well with no immediate complications.    Medications  lidocaine (XYLOCAINE) 1 % (with pres) injection 5 mL (has no administration in time range)     ____________________________________________   INITIAL IMPRESSION / ASSESSMENT AND PLAN / ED COURSE  Pertinent labs & imaging results that were available during my care of the patient were reviewed by me and considered in my medical decision making (see chart for details).  Review of the [place] CSRS was performed in accordance of the [place] prior to dispensing any controlled drugs.           Assessment and plan Laceration 47 year old male presents to the emergency department with a right thumb laceration.  Laceration was repaired in the emergency department without complication.  He was advised  to have external sutures removed by primary care in 7 days.  Tetanus status is up-to-date.  He was discharged with Keflex.  All patient questions were answered.   ____________________________________________  FINAL CLINICAL IMPRESSION(S) / ED DIAGNOSES  Final diagnoses:  Laceration of right thumb without foreign body without damage to nail, initial encounter      NEW MEDICATIONS STARTED DURING THIS VISIT:  ED Discharge Orders         Ordered    cephALEXin (KEFLEX) 500 MG capsule  3 times daily     11/16/18 2312              This chart was dictated using voice recognition software/Dragon. Despite best efforts to proofread, errors can occur which can change the meaning. Any change was purely unintentional.    Lannie Fields, PA-C 11/16/18 2316    Arta Silence, MD 11/17/18 [phone]

## 2018-11-20 ENCOUNTER — Other Ambulatory Visit: Payer: Self-pay

## 2018-11-20 ENCOUNTER — Encounter: Payer: Self-pay | Admitting: Emergency Medicine

## 2018-11-20 ENCOUNTER — Emergency Department
Admission: EM | Admit: 2018-11-20 | Discharge: 2018-11-20 | Disposition: A | Discharge status: Home Health | Payer: Self-pay | Attending: Student in an Organized Health Care Education/Training Program | Admitting: Student in an Organized Health Care Education/Training Program

## 2018-11-20 ENCOUNTER — Emergency Department: Payer: Self-pay

## 2018-11-20 DIAGNOSIS — R1013 Epigastric pain: Secondary | ICD-10-CM | POA: Insufficient documentation

## 2018-11-20 DIAGNOSIS — F17210 Nicotine dependence, cigarettes, uncomplicated: Secondary | ICD-10-CM | POA: Insufficient documentation

## 2018-11-20 DIAGNOSIS — Z79899 Other long term (current) drug therapy: Secondary | ICD-10-CM | POA: Insufficient documentation

## 2018-11-20 DIAGNOSIS — I10 Essential (primary) hypertension: Secondary | ICD-10-CM | POA: Insufficient documentation

## 2018-11-20 LAB — COMPREHENSIVE METABOLIC PANEL
ALT: 148 U/L — ABNORMAL HIGH (ref 0–44)
AST: 137 U/L — ABNORMAL HIGH (ref 15–41)
Albumin: 4.2 g/dL (ref 3.5–5.0)
Alkaline Phosphatase: 56 U/L (ref 38–126)
Anion gap: 9 (ref 5–15)
BUN: 10 mg/dL (ref 6–20)
CO2: 28 mmol/L (ref 22–32)
Calcium: 9.2 mg/dL (ref 8.9–10.3)
Chloride: 103 mmol/L (ref 98–111)
Creatinine, Ser: 0.96 mg/dL (ref 0.61–1.24)
GFR calc Af Amer: >60 mL/min (ref >60)
GFR calc non Af Amer: >60 mL/min (ref >60)
Glucose, Bld: 190 mg/dL — ABNORMAL HIGH (ref 70–99)
Potassium: 3.9 mmol/L (ref 3.5–5.1)
Sodium: 140 mmol/L (ref 135–145)
Total Bilirubin: 0.6 mg/dL (ref 0.3–1.2)
Total Protein: 7.2 g/dL (ref 6.5–8.1)

## 2018-11-20 LAB — URINALYSIS, COMPLETE (UACMP) WITH MICROSCOPIC
Bacteria, UA: NONE SEEN
Bilirubin Urine: NEGATIVE
Glucose, UA: NEGATIVE mg/dL
Hgb urine dipstick: NEGATIVE
Ketones, ur: NEGATIVE mg/dL
Leukocytes,Ua: NEGATIVE
Nitrite: NEGATIVE
Protein, ur: NEGATIVE mg/dL
Specific Gravity, Urine: 1.03 (ref 1.005–1.030)
Squamous Epithelial / HPF: NONE SEEN (ref 0–5)
pH: 7 (ref 5.0–8.0)

## 2018-11-20 LAB — CBC
HCT: 48.9 % (ref 39.0–52.0)
Hemoglobin: 16.8 g/dL (ref 13.0–17.0)
MCH: 31.8 pg (ref 26.0–34.0)
MCHC: 34.4 g/dL (ref 30.0–36.0)
MCV: 92.6 fL (ref 80.0–100.0)
Platelets: 191 10*3/uL (ref 150–400)
RBC: 5.28 MIL/uL (ref 4.22–5.81)
RDW: 15.2 % (ref 11.5–15.5)
WBC: 5 10*3/uL (ref 4.0–10.5)
nRBC: 0 % (ref 0.0–0.2)

## 2018-11-20 LAB — TROPONIN I (HIGH SENSITIVITY): Troponin I (High Sensitivity): 7 ng/L (ref <18)

## 2018-11-20 LAB — LIPASE, BLOOD: Lipase: 39 U/L (ref 11–51)

## 2018-11-20 MED ORDER — PANTOPRAZOLE SODIUM 20 MG PO TBEC: 20.0000 mg | DELAYED_RELEASE_TABLET | Freq: Every day | ORAL | 1 refills | Status: DC

## 2018-11-20 MED ORDER — TRAMADOL HCL 50 MG PO TABS: 50.0000 mg | ORAL_TABLET | Freq: Four times a day (QID) | ORAL | 0 refills | Status: AC | PRN

## 2018-11-20 MED ORDER — MORPHINE SULFATE (PF) 4 MG/ML IV SOLN
4.0000 mg | INTRAVENOUS | Status: DC | PRN
  Administered 2018-11-20 (×2): 4 mg via INTRAVENOUS
  Filled 2018-11-20 (×2): qty 1

## 2018-11-20 MED ORDER — ALUM & MAG HYDROXIDE-SIMETH 200-200-20 MG/5ML PO SUSP
30.0000 mL | Freq: Once | ORAL | Status: AC
  Administered 2018-11-20: 30 mL via ORAL
  Filled 2018-11-20: qty 30

## 2018-11-20 MED ORDER — PROMETHAZINE HCL 12.5 MG PO TABS: 12.5000 mg | ORAL_TABLET | Freq: Four times a day (QID) | ORAL | 0 refills | Status: DC | PRN

## 2018-11-20 MED ORDER — PROMETHAZINE HCL 25 MG/ML IJ SOLN
12.5000 mg | Freq: Four times a day (QID) | INTRAMUSCULAR | Status: DC | PRN
  Administered 2018-11-20: 10:00:00 12.5 mg via INTRAVENOUS
  Filled 2018-11-20 (×2): qty 1

## 2018-11-20 MED ORDER — SODIUM CHLORIDE 0.9 % IV BOLUS
1000.0000 mL | Freq: Once | INTRAVENOUS | Status: AC
  Administered 2018-11-20: 1000 mL via INTRAVENOUS

## 2018-11-20 MED ORDER — IOHEXOL 300 MG/ML  SOLN
100.0000 mL | Freq: Once | INTRAMUSCULAR | Status: AC | PRN
  Administered 2018-11-20: 100 mL via INTRAVENOUS

## 2018-11-20 MED ORDER — LIDOCAINE VISCOUS HCL 2 % MT SOLN
15.0000 mL | Freq: Once | OROMUCOSAL | Status: AC
  Administered 2018-11-20: 15 mL via ORAL
  Filled 2018-11-20: qty 15

## 2018-11-20 NOTE — ED Notes (Signed)
Pt unable to void at this time. 

## 2018-11-20 NOTE — ED Notes (Signed)
Pt st feeling nauseous at this time.

## 2018-11-20 NOTE — Discharge Instructions (Signed)

## 2018-11-20 NOTE — ED Triage Notes (Signed)
Says onset upper abdominal pain 2am and it feels like his pancreatitis

## 2018-11-20 NOTE — ED Notes (Signed)
This Rn contacted POC to pick up pt. Pt up for DC at this time.

## 2018-11-20 NOTE — ED Provider Notes (Signed)
[facility] Emergency Department Provider Note    First MD Initiated Contact with Patient 11/20/18 [phone]     (approximate)  I have reviewed the triage vital signs and the nursing notes.   HISTORY  Chief Complaint Abdominal Pain    HPI Kristopher [name] is a 48 y.o. male below his past medical history presents the ER with burning epigastric pain radiating through to his back.  States it feels consistent with his previous episodes of pancreatitis.  States he drank 12 beers last night.  States that he would rather be at work but needs something to help with the burning pain.  Denies any chest pain or shortness of breath.  No fevers.  Is having nonbilious nonbloody vomiting.   Past Medical History:  Diagnosis Date  . GERD (gastroesophageal reflux disease)   . Hypertension   . Pancreatitis    No family history on file. Past Surgical History:  Procedure Laterality Date  . ABDOMINAL SURGERY     There are no active problems to display for this patient.     Prior to Admission medications   Medication Sig Start Date End Date Taking? Authorizing Provider  amLODipine (NORVASC) 10 MG tablet Take 10 mg by mouth daily.   Yes [provider]  cephALEXin (KEFLEX) 500 MG capsule Take 1 capsule (500 mg total) by mouth 3 (three) times daily for 7 days. 11/16/18 11/23/18 Yes Pia Mau M, PA-C  hydrOXYzine (ATARAX/VISTARIL) 25 MG tablet Take 25 mg by mouth every 4 (four) hours as needed.   Yes [provider]  oxyCODONE (ROXICODONE) 5 MG immediate release tablet Take 1 tablet (5 mg total) by mouth every 4 (four) hours as needed for severe pain. 10/29/18  Yes Irean Hong, MD  famotidine (PEPCID) 20 MG tablet Take 1 tablet (20 mg total) by mouth 2 (two) times daily. 10/29/18   Irean Hong, MD  ondansetron (ZOFRAN ODT) 4 MG disintegrating tablet Take 1 tablet (4 mg total) by mouth every 8 (eight) hours as needed for nausea or vomiting. Patient not taking:  Reported on 11/20/2018 10/29/18   Irean Hong, MD  oxyCODONE-acetaminophen (PERCOCET) 5-325 MG tablet Take 1 tablet by mouth every 4 (four) hours as needed for severe pain. Patient not taking: Reported on 11/20/2018 09/29/18 09/29/19  Arnaldo Natal, MD  pantoprazole (PROTONIX) 20 MG tablet Take 1 tablet (20 mg total) by mouth daily. 11/20/18 11/20/19  Willy Eddy, MD  promethazine (PHENERGAN) 12.5 MG tablet Take 1 tablet (12.5 mg total) by mouth every 6 (six) hours as needed. 11/20/18   Willy Eddy, MD  traMADol (ULTRAM) 50 MG tablet Take 1 tablet (50 mg total) by mouth every 6 (six) hours as needed for severe pain. 11/20/18 11/20/19  Willy Eddy, MD    Allergies Patient has no known allergies.    Social History Social History   Tobacco Use  . Smoking status: Current Every Day Smoker    Packs/day: 1.00    Types: Cigarettes  . Smokeless tobacco: Never Used  Substance Use Topics  . Alcohol use: Yes  . Drug use: Not Currently    Types: Marijuana    Review of Systems Patient denies headaches, rhinorrhea, blurry vision, numbness, shortness of breath, chest pain, edema, cough, abdominal pain, nausea, vomiting, diarrhea, dysuria, fevers, rashes or hallucinations unless otherwise stated above in HPI. ____________________________________________   PHYSICAL EXAM:  VITAL SIGNS: Vitals:   11/20/18 1115 11/20/18 1233  BP: (!) 159/98 (!) 160/87  Pulse: 76 77  Resp: 17 18  Temp:  97.9 F (36.6 C)  SpO2: 97% 99%    Constitutional: Alert and oriented.  Eyes: Conjunctivae are normal.  Head: Atraumatic. Nose: No congestion/rhinnorhea. Mouth/Throat: Mucous membranes are moist.   Neck: No stridor. Painless ROM.  Cardiovascular: Normal rate, regular rhythm. Grossly normal heart sounds.  Good peripheral circulation. Respiratory: Normal respiratory effort.  No retractions. Lungs CTAB. Gastrointestinal: Soft and nontender. No distention. No abdominal bruits. No CVA tenderness.  Genitourinary: deferred Musculoskeletal: No lower extremity tenderness nor edema.  No joint effusions. Neurologic:  Normal speech and language. No gross focal neurologic deficits are appreciated. No facial droop Skin:  Skin is warm, dry and intact. No rash noted. Psychiatric: Mood and affect are normal. Speech and behavior are normal.  ____________________________________________   LABS (all labs ordered are listed, but only abnormal results are displayed)  Results for orders placed or performed during the hospital encounter of 11/20/18 (from the past 24 hour(s))  Lipase, blood     Status: None   Collection Time: 11/20/18  9:07 AM  Result Value Ref Range   Lipase 39 11 - 51 U/L  Comprehensive metabolic panel     Status: Abnormal   Collection Time: 11/20/18  9:07 AM  Result Value Ref Range   Sodium 140 135 - 145 mmol/L   Potassium 3.9 3.5 - 5.1 mmol/L   Chloride 103 98 - 111 mmol/L   CO2 28 22 - 32 mmol/L   Glucose, Bld 190 (H) 70 - 99 mg/dL   BUN 10 6 - 20 mg/dL   Creatinine, Ser 1.610.96 0.61 - 1.24 mg/dL   Calcium 9.2 8.9 - 09.610.3 mg/dL   Total Protein 7.2 6.5 - 8.1 g/dL   Albumin 4.2 3.5 - 5.0 g/dL   AST 045137 (H) 15 - 41 U/L   ALT 148 (H) 0 - 44 U/L   Alkaline Phosphatase 56 38 - 126 U/L   Total Bilirubin 0.6 0.3 - 1.2 mg/dL   GFR calc non Af Amer >60 >60 mL/min   GFR calc Af Amer >60 >60 mL/min   Anion gap 9 5 - 15  CBC     Status: None   Collection Time: 11/20/18  9:07 AM  Result Value Ref Range   WBC 5.0 4.0 - 10.5 K/uL   RBC 5.28 4.22 - 5.81 MIL/uL   Hemoglobin 16.8 13.0 - 17.0 g/dL   HCT 40.948.9 81.139.0 - 91.452.0 %   MCV 92.6 80.0 - 100.0 fL   MCH 31.8 26.0 - 34.0 pg   MCHC 34.4 30.0 - 36.0 g/dL   RDW 78.215.2 95.611.5 - 21.315.5 %   Platelets 191 150 - 400 K/uL   nRBC 0.0 0.0 - 0.2 %  Troponin I (High Sensitivity)     Status: None   Collection Time: 11/20/18  9:07 AM  Result Value Ref Range   Troponin I (High Sensitivity) 7 <18 ng/L  Urinalysis, Complete w Microscopic     Status:  Abnormal   Collection Time: 11/20/18 11:52 AM  Result Value Ref Range   Color, Urine STRAW (A) YELLOW   APPearance CLEAR (A) CLEAR   Specific Gravity, Urine 1.030 1.005 - 1.030   pH 7.0 5.0 - 8.0   Glucose, UA NEGATIVE NEGATIVE mg/dL   Hgb urine dipstick NEGATIVE NEGATIVE   Bilirubin Urine NEGATIVE NEGATIVE   Ketones, ur NEGATIVE NEGATIVE mg/dL   Protein, ur NEGATIVE NEGATIVE mg/dL   Nitrite NEGATIVE NEGATIVE   Leukocytes,Ua NEGATIVE NEGATIVE  RBC / HPF 0-5 0 - 5 RBC/hpf   WBC, UA 0-5 0 - 5 WBC/hpf   Bacteria, UA NONE SEEN NONE SEEN   Squamous Epithelial / LPF NONE SEEN 0 - 5   ____________________________________________  EKG My review and personal interpretation at Time: 9:31   Indication: n/v  Rate: 80  Rhythm: sinus Axis: normal Other: no stemi, normal intervals ____________________________________________  RADIOLOGY  I personally reviewed all radiographic images ordered to evaluate for the above acute complaints and reviewed radiology reports and findings.  These findings were personally discussed with the patient.  Please see medical record for radiology report.  ____________________________________________   PROCEDURES  Procedure(s) performed:  Procedures    Critical Care performed: no ____________________________________________   INITIAL IMPRESSION / ASSESSMENT AND PLAN / ED COURSE  Pertinent labs & imaging results that were available during my care of the patient were reviewed by me and considered in my medical decision making (see chart for details).   DDX: Pancreatitis, gastritis, cholelithiasis, cholecystitis, enteritis, esophagitis  Kristopher [name] is a 48 y.o. who presents to the ED with was as described above.  Patient nontoxic afebrile hemodynamically stable.  Does admit to worsening symptoms after drinking 12 beers last night.  Does have history of pancreatitis.  Blood work is reassuring but is having moderate epigastric pain and given his  elevated LFTs will order CT imaging.  Clinical Course as of Nov 19 1509  Thu Nov 20, 2018  1139 CT imaging is reassuring.  Patient's pain improved.  Is tolerating p.o.  Does not seem consistent with intrathoracic pathology.  More consistent with alcoholic gastritis.  Possible early pancreatitis.  Patient stable and appropriate for outpatient follow-up.   [PR]    Clinical Course User Index [PR] Merlyn Lot, MD    The patient was evaluated in Emergency Department today for the symptoms described in the history of present illness. He/she was evaluated in the context of the global COVID-19 pandemic, which necessitated consideration that the patient might be at risk for infection with the SARS-CoV-2 virus that causes COVID-19. Institutional protocols and algorithms that pertain to the evaluation of patients at risk for COVID-19 are in a state of rapid change based on information released by regulatory bodies including the CDC and federal and state organizations. These policies and algorithms were followed during the patient's care in the ED.  As part of my medical decision making, I reviewed the following data within the [place] notes reviewed and incorporated, Labs reviewed, notes from prior ED visits and [place] Controlled Substance Database   ____________________________________________   FINAL CLINICAL IMPRESSION(S) / ED DIAGNOSES  Final diagnoses:  Epigastric pain      NEW MEDICATIONS STARTED DURING THIS VISIT:  Discharge Medication List as of 11/20/2018 11:35 AM    START taking these medications   Details  pantoprazole (PROTONIX) 20 MG tablet Take 1 tablet (20 mg total) by mouth daily., Starting Thu 11/20/2018, Until Fri 11/20/2019, Normal    promethazine (PHENERGAN) 12.5 MG tablet Take 1 tablet (12.5 mg total) by mouth every 6 (six) hours as needed., Starting Thu 11/20/2018, Normal    traMADol (ULTRAM) 50 MG tablet Take 1 tablet (50 mg total) by mouth  every 6 (six) hours as needed for severe pain., Starting Thu 11/20/2018, Until Fri 11/20/2019, Normal         Note:  This document was prepared using Dragon voice recognition software and may include unintentional dictation errors.    Merlyn Lot,  MD 11/20/18 1511

## 2018-11-20 NOTE — ED Notes (Signed)
This RN introduced self to pt. Pt denies any needs currently. Pt family member at bedside. Will continue to monitor and assess for any further needs.

## 2018-11-20 NOTE — ED Notes (Signed)
Patient transported to CT 

## 2018-11-20 NOTE — ED Notes (Signed)
Pt provided with phone to contact family.  

## 2018-11-20 NOTE — ED Notes (Signed)
ED Provider Robinson at bedside. 

## 2018-11-24 ENCOUNTER — Encounter: Payer: Self-pay | Admitting: Emergency Medicine

## 2018-11-24 ENCOUNTER — Other Ambulatory Visit: Payer: Self-pay

## 2018-11-24 DIAGNOSIS — F17210 Nicotine dependence, cigarettes, uncomplicated: Secondary | ICD-10-CM | POA: Insufficient documentation

## 2018-11-24 DIAGNOSIS — F1210 Cannabis abuse, uncomplicated: Secondary | ICD-10-CM | POA: Insufficient documentation

## 2018-11-24 DIAGNOSIS — K852 Alcohol induced acute pancreatitis without necrosis or infection: Secondary | ICD-10-CM | POA: Insufficient documentation

## 2018-11-24 DIAGNOSIS — I10 Essential (primary) hypertension: Secondary | ICD-10-CM | POA: Insufficient documentation

## 2018-11-24 DIAGNOSIS — Z79899 Other long term (current) drug therapy: Secondary | ICD-10-CM | POA: Insufficient documentation

## 2018-11-24 NOTE — ED Triage Notes (Signed)
Patient to ER for c/o continued abd pain. Patient arrives by [facility], [facility]. Patient reports being seen recently for the same. Patient state pain feels similar to previous episodes of pancreatitis. Patient in no acute distress at this time.

## 2018-11-25 ENCOUNTER — Emergency Department
Admission: EM | Admit: 2018-11-25 | Discharge: 2018-11-25 | Disposition: A | Discharge status: Home / Self Care | Payer: Self-pay | Attending: Emergency Medicine | Admitting: Emergency Medicine

## 2018-11-25 DIAGNOSIS — K852 Alcohol induced acute pancreatitis without necrosis or infection: Secondary | ICD-10-CM

## 2018-11-25 LAB — CBC
HCT: 48.6 % (ref 39.0–52.0)
Hemoglobin: 17.1 g/dL — ABNORMAL HIGH (ref 13.0–17.0)
MCH: 32.3 pg (ref 26.0–34.0)
MCHC: 35.2 g/dL (ref 30.0–36.0)
MCV: 91.7 fL (ref 80.0–100.0)
Platelets: 206 10*3/uL (ref 150–400)
RBC: 5.3 MIL/uL (ref 4.22–5.81)
RDW: 14.6 % (ref 11.5–15.5)
WBC: 6.3 10*3/uL (ref 4.0–10.5)
nRBC: 0 % (ref 0.0–0.2)

## 2018-11-25 LAB — COMPREHENSIVE METABOLIC PANEL
ALT: 146 U/L — ABNORMAL HIGH (ref 0–44)
AST: 130 U/L — ABNORMAL HIGH (ref 15–41)
Albumin: 4.4 g/dL (ref 3.5–5.0)
Alkaline Phosphatase: 66 U/L (ref 38–126)
Anion gap: 14 (ref 5–15)
BUN: 7 mg/dL (ref 6–20)
CO2: 21 mmol/L — ABNORMAL LOW (ref 22–32)
Calcium: 8.9 mg/dL (ref 8.9–10.3)
Chloride: 103 mmol/L (ref 98–111)
Creatinine, Ser: 0.69 mg/dL (ref 0.61–1.24)
GFR calc Af Amer: >60 mL/min (ref >60)
GFR calc non Af Amer: >60 mL/min (ref >60)
Glucose, Bld: 112 mg/dL — ABNORMAL HIGH (ref 70–99)
Potassium: 3.4 mmol/L — ABNORMAL LOW (ref 3.5–5.1)
Sodium: 138 mmol/L (ref 135–145)
Total Bilirubin: 0.6 mg/dL (ref 0.3–1.2)
Total Protein: 7.5 g/dL (ref 6.5–8.1)

## 2018-11-25 LAB — ETHANOL: Alcohol, Ethyl (B): 278 mg/dL — ABNORMAL HIGH (ref <10)

## 2018-11-25 LAB — LIPASE, BLOOD: Lipase: 116 U/L — ABNORMAL HIGH (ref 11–51)

## 2018-11-25 MED ORDER — HYDROMORPHONE HCL 1 MG/ML IJ SOLN
1.0000 mg | Freq: Once | INTRAMUSCULAR | Status: AC
  Administered 2018-11-25: 04:00:00 1 mg via INTRAVENOUS
  Filled 2018-11-25: qty 1

## 2018-11-25 MED ORDER — SODIUM CHLORIDE 0.9 % IV BOLUS
1000.0000 mL | Freq: Once | INTRAVENOUS | Status: AC
  Administered 2018-11-25: 04:00:00 1000 mL via INTRAVENOUS

## 2018-11-25 MED ORDER — ONDANSETRON HCL 4 MG/2ML IJ SOLN
4.0000 mg | Freq: Once | INTRAMUSCULAR | Status: AC
  Administered 2018-11-25: 4 mg via INTRAVENOUS
  Filled 2018-11-25: qty 2

## 2018-11-25 MED ORDER — OXYCODONE HCL 5 MG PO TABS: 5.0000 mg | ORAL_TABLET | Freq: Three times a day (TID) | ORAL | 0 refills | Status: AC | PRN

## 2018-11-25 MED ORDER — PANTOPRAZOLE SODIUM 40 MG IV SOLR
40.0000 mg | Freq: Once | INTRAVENOUS | Status: AC
  Administered 2018-11-25: 04:00:00 40 mg via INTRAVENOUS
  Filled 2018-11-25: qty 40

## 2018-11-25 MED ORDER — ONDANSETRON HCL 4 MG PO TABS: 4.0000 mg | ORAL_TABLET | Freq: Every day | ORAL | 0 refills | Status: DC | PRN

## 2018-11-25 MED ORDER — HYDROMORPHONE HCL 1 MG/ML IJ SOLN
1.0000 mg | Freq: Once | INTRAMUSCULAR | Status: AC
  Administered 2018-11-25: 06:00:00 1 mg via INTRAVENOUS
  Filled 2018-11-25: qty 1

## 2018-11-25 NOTE — ED Notes (Signed)
Patient states having abdominal pain that started this evening after drinking beer. Patient states he was drinking a 24 with someone else. Patient unsure of how many of the beer he drank. Patient appears intoxicated.

## 2018-11-25 NOTE — ED Provider Notes (Signed)
[facility] Emergency Department Provider Note  ____________________________________________   First MD Initiated Contact with Patient 11/25/18 0310     (approximate)  I have reviewed the triage vital signs and the nursing notes.   HISTORY  Chief Complaint Abdominal Pain    HPI Kristopher [name] is a 48 y.o. male with alcohol use and hepatitis C who presents with abdominal pain.  Patient endorses abdominal pain that started today.  Patient did drink 24 beers with some friends earlier today before the pain started.  The pain is in his left upper quadrant, radiates to his back, nothing makes it better, nothing makes it worse.  Denies any chest pain or shortness of breath.  Denies history of complicated withdrawal.          Past Medical History:  Diagnosis Date   GERD (gastroesophageal reflux disease)    Hypertension    Pancreatitis     There are no active problems to display for this patient.   Past Surgical History:  Procedure Laterality Date   ABDOMINAL SURGERY      Prior to Admission medications   Medication Sig Start Date End Date Taking? Authorizing Provider  amLODipine (NORVASC) 10 MG tablet Take 10 mg by mouth daily.    [provider]  famotidine (PEPCID) 20 MG tablet Take 1 tablet (20 mg total) by mouth 2 (two) times daily. 10/29/18   Paulette Blanch, MD  hydrOXYzine (ATARAX/VISTARIL) 25 MG tablet Take 25 mg by mouth every 4 (four) hours as needed.    [provider]  ondansetron (ZOFRAN ODT) 4 MG disintegrating tablet Take 1 tablet (4 mg total) by mouth every 8 (eight) hours as needed for nausea or vomiting. Patient not taking: Reported on 11/20/2018 10/29/18   Paulette Blanch, MD  oxyCODONE (ROXICODONE) 5 MG immediate release tablet Take 1 tablet (5 mg total) by mouth every 4 (four) hours as needed for severe pain. 10/29/18   Paulette Blanch, MD  oxyCODONE-acetaminophen (PERCOCET) 5-325 MG tablet Take 1 tablet by mouth every 4  (four) hours as needed for severe pain. Patient not taking: Reported on 11/20/2018 09/29/18 09/29/19  Nena Polio, MD  pantoprazole (PROTONIX) 20 MG tablet Take 1 tablet (20 mg total) by mouth daily. 11/20/18 11/20/19  Merlyn Lot, MD  promethazine (PHENERGAN) 12.5 MG tablet Take 1 tablet (12.5 mg total) by mouth every 6 (six) hours as needed. 11/20/18   Merlyn Lot, MD  traMADol (ULTRAM) 50 MG tablet Take 1 tablet (50 mg total) by mouth every 6 (six) hours as needed for severe pain. 11/20/18 11/20/19  Merlyn Lot, MD    Allergies Patient has no known allergies.  No family history on file.  Social History Social History   Tobacco Use   Smoking status: Current Every Day Smoker    Packs/day: 1.00    Types: Cigarettes   Smokeless tobacco: Never Used  Substance Use Topics   Alcohol use: Yes   Drug use: Not Currently    Types: Marijuana      Review of Systems Constitutional: No fever/chills Eyes: No visual changes. ENT: No sore throat. Cardiovascular: Denies chest pain. Respiratory: Denies shortness of breath. Gastrointestinal: Positive for abdominal pain. Genitourinary: Negative for dysuria. Musculoskeletal: Negative for back pain. Skin: Negative for rash. Neurological: Negative for headaches, focal weakness or numbness. All other ROS negative ____________________________________________   PHYSICAL EXAM:  VITAL SIGNS: ED Triage Vitals [11/24/18 2343]  Enc Vitals Group     BP 133/82  Pulse Rate 84     Resp 20     Temp 97.9 F (36.6 C)     Temp Source Oral     SpO2 95 %     Weight 219 lb 12.8 oz (99.7 kg)     Height 5\' 11"  (1.803 m)     Head Circumference      Peak Flow      Pain Score 10     Pain Loc      Pain Edu?      Excl. in GC?     Constitutional: Alert and oriented. Well appearing and in no acute distress. Eyes: Conjunctivae are normal. EOMI. Head: Atraumatic. Nose: No congestion/rhinnorhea. Mouth/Throat: Mucous membranes are  moist.   Neck: No stridor. Trachea Midline. FROM Cardiovascular: Normal rate, regular rhythm. Grossly normal heart sounds.  Good peripheral circulation. Respiratory: Normal respiratory effort.  No retractions. Lungs CTAB. Gastrointestinal: Left upper quadrant tenderness. no distention. No abdominal bruits. No rebound or guarding.  Musculoskeletal: No lower extremity tenderness nor edema.  No joint effusions. Neurologic:  Normal speech and language. No gross focal neurologic deficits are appreciated.  Skin:  Skin is warm, dry and intact. No rash noted. Psychiatric: Mood and affect are normal. Speech and behavior are normal. GU: Deferred   ____________________________________________   LABS (all labs ordered are listed, but only abnormal results are displayed)  Labs Reviewed  LIPASE, BLOOD - Abnormal; Notable for the following components:      Result Value   Lipase 116 (*)    All other components within normal limits  COMPREHENSIVE METABOLIC PANEL - Abnormal; Notable for the following components:   Potassium 3.4 (*)    CO2 21 (*)    Glucose, Bld 112 (*)    AST 130 (*)    ALT 146 (*)    All other components within normal limits  CBC - Abnormal; Notable for the following components:   Hemoglobin 17.1 (*)    All other components within normal limits  ETHANOL - Abnormal; Notable for the following components:   Alcohol, Ethyl (B) 278 (*)    All other components within normal limits  URINALYSIS, COMPLETE (UACMP) WITH MICROSCOPIC   ____________________________________________   ED ECG REPORT I, Concha Se, the attending physician, personally viewed and interpreted this ECG.  EKG is normal sinus rate of 84, no ST elevation, no T wave inversion, possible early right bundle branch block, normal intervals ____________________________________________   INITIAL IMPRESSION / ASSESSMENT AND PLAN / ED COURSE  [name] was evaluated in Emergency Department on 11/25/2018 for the  symptoms described in the history of present illness. He was evaluated in the context of the global COVID-19 pandemic, which necessitated consideration that the patient might be at risk for infection with the SARS-CoV-2 virus that causes COVID-19. Institutional protocols and algorithms that pertain to the evaluation of patients at risk for COVID-19 are in a state of rapid change based on information released by regulatory bodies including the CDC and federal and state organizations. These policies and algorithms were followed during the patient's care in the ED.    Patient exam is most consistent with alcoholic pancreatitis.  Will get labs to confirm.  Patient is already had 2 recent CT scans and I discussed with patient he says that this feels very similar and we elected to hold off on CT scan given my very low suspicion for SBO, appendicitis, diverticulitis given his location of pain.  Patient has no chest pain to  suggest ACS but will get EKG.  No urinary symptoms to suggest UTI.  I have lower suspicion for this being gallstone pancreatitis given prior negative CTs recently.   Labs notable for a lipase of 116 which consistent with concern for pancreatitis.  LFTs are slightly elevated which is similar to prior.  White count is normal therefore low suspicion for infection.  His ethanol level was elevated 278.    Given fluids, dilaudid, zofran.   6:13 AM provided alcohol cessation counseling for 10 minutes.  Patient is been here for over 7 hours and appears clinically sober.  I offered patient admission for his pancreatitis as well as alcohol cessation.  Patient preferred to go home.  We will give a very short course of pain medication.  He already has Librium at home.  I described patient that he is very high risk for abuse and death from the pain medicine.  I explained that he should not drink alcohol with that he expressed understanding.  I discussed diet for pancreatitis rx.   I discussed the  provisional nature of ED diagnosis, the treatment so far, the ongoing plan of care, follow up appointments and return precautions with the patient and any family or support people present. They expressed understanding and agreed with the plan, discharged home.   ____________________________________________   FINAL CLINICAL IMPRESSION(S) / ED DIAGNOSES   Final diagnoses:  Alcohol-induced acute pancreatitis, unspecified complication status      MEDICATIONS GIVEN DURING THIS VISIT:  Medications  sodium chloride 0.9 % bolus 1,000 mL (0 mLs Intravenous Stopped 11/25/18 0514)  sodium chloride 0.9 % bolus 1,000 mL (0 mLs Intravenous Stopped 11/25/18 0514)  ondansetron (ZOFRAN) injection 4 mg (4 mg Intravenous Given 11/25/18 0336)  HYDROmorphone (DILAUDID) injection 1 mg (1 mg Intravenous Given 11/25/18 0336)  pantoprazole (PROTONIX) injection 40 mg (40 mg Intravenous Given 11/25/18 0337)  HYDROmorphone (DILAUDID) injection 1 mg (1 mg Intravenous Given 11/25/18 0426)  HYDROmorphone (DILAUDID) injection 1 mg (1 mg Intravenous Given 11/25/18 0545)     ED Discharge Orders         Ordered    oxyCODONE (ROXICODONE) 5 MG immediate release tablet  Every 8 hours PRN     11/25/18 0621    ondansetron (ZOFRAN) 4 MG tablet  Daily PRN     11/25/18 [id number]           Note:  This document was prepared using Dragon voice recognition software and may include unintentional dictation errors.   Concha SeFunke, Michon Kaczmarek E, MD 11/25/18 [phone]

## 2018-11-25 NOTE — Discharge Instructions (Addendum)
Your labs were consistent with pancreatitis.  It is important you drink clear liquids for the next 2 to 3 days and gradually restart your diet eating small and low-fat meals.  You should cut down on your alcohol use.  We will give a very short course of pain medication.  You should not drink alcohol with this as it can be life-threatening. Take the zofran to help with nausea.

## 2018-11-29 ENCOUNTER — Encounter: Payer: Self-pay | Admitting: Emergency Medicine

## 2018-11-29 ENCOUNTER — Emergency Department: Payer: Self-pay

## 2018-11-29 ENCOUNTER — Emergency Department
Admission: EM | Admit: 2018-11-29 | Discharge: 2018-11-29 | Disposition: A | Discharge status: Home / Self Care | Payer: Self-pay | Attending: Emergency Medicine | Admitting: Emergency Medicine

## 2018-11-29 DIAGNOSIS — K529 Noninfective gastroenteritis and colitis, unspecified: Secondary | ICD-10-CM | POA: Insufficient documentation

## 2018-11-29 DIAGNOSIS — I10 Essential (primary) hypertension: Secondary | ICD-10-CM | POA: Insufficient documentation

## 2018-11-29 DIAGNOSIS — Z7289 Other problems related to lifestyle: Secondary | ICD-10-CM

## 2018-11-29 DIAGNOSIS — Z789 Other specified health status: Secondary | ICD-10-CM

## 2018-11-29 DIAGNOSIS — Z79899 Other long term (current) drug therapy: Secondary | ICD-10-CM | POA: Insufficient documentation

## 2018-11-29 DIAGNOSIS — F17210 Nicotine dependence, cigarettes, uncomplicated: Secondary | ICD-10-CM | POA: Insufficient documentation

## 2018-11-29 DIAGNOSIS — F1099 Alcohol use, unspecified with unspecified alcohol-induced disorder: Secondary | ICD-10-CM | POA: Insufficient documentation

## 2018-11-29 LAB — CBC
HCT: 49 % (ref 39.0–52.0)
Hemoglobin: 17 g/dL (ref 13.0–17.0)
MCH: 31.7 pg (ref 26.0–34.0)
MCHC: 34.7 g/dL (ref 30.0–36.0)
MCV: 91.2 fL (ref 80.0–100.0)
Platelets: 204 10*3/uL (ref 150–400)
RBC: 5.37 MIL/uL (ref 4.22–5.81)
RDW: 14.6 % (ref 11.5–15.5)
WBC: 5.6 10*3/uL (ref 4.0–10.5)
nRBC: 0 % (ref 0.0–0.2)

## 2018-11-29 LAB — COMPREHENSIVE METABOLIC PANEL
ALT: 141 U/L — ABNORMAL HIGH (ref 0–44)
AST: 144 U/L — ABNORMAL HIGH (ref 15–41)
Albumin: 4.5 g/dL (ref 3.5–5.0)
Alkaline Phosphatase: 64 U/L (ref 38–126)
Anion gap: 13 (ref 5–15)
BUN: 7 mg/dL (ref 6–20)
CO2: 22 mmol/L (ref 22–32)
Calcium: 8.9 mg/dL (ref 8.9–10.3)
Chloride: 103 mmol/L (ref 98–111)
Creatinine, Ser: 0.82 mg/dL (ref 0.61–1.24)
GFR calc Af Amer: >60 mL/min (ref >60)
GFR calc non Af Amer: >60 mL/min (ref >60)
Glucose, Bld: 111 mg/dL — ABNORMAL HIGH (ref 70–99)
Potassium: 3.4 mmol/L — ABNORMAL LOW (ref 3.5–5.1)
Sodium: 138 mmol/L (ref 135–145)
Total Bilirubin: 0.7 mg/dL (ref 0.3–1.2)
Total Protein: 7.8 g/dL (ref 6.5–8.1)

## 2018-11-29 LAB — URINALYSIS, COMPLETE (UACMP) WITH MICROSCOPIC
Bilirubin Urine: NEGATIVE
Glucose, UA: NEGATIVE mg/dL
Hgb urine dipstick: NEGATIVE
Ketones, ur: NEGATIVE mg/dL
Leukocytes,Ua: NEGATIVE
Nitrite: NEGATIVE
Protein, ur: NEGATIVE mg/dL
Specific Gravity, Urine: 1.003 — ABNORMAL LOW (ref 1.005–1.030)
Squamous Epithelial / HPF: NONE SEEN (ref 0–5)
pH: 6 (ref 5.0–8.0)

## 2018-11-29 LAB — LIPASE, BLOOD: Lipase: 38 U/L (ref 11–51)

## 2018-11-29 MED ORDER — SUCRALFATE 1 G PO TABS
1.0000 g | ORAL_TABLET | Freq: Once | ORAL | Status: AC
  Administered 2018-11-29: 09:00:00 1 g via ORAL
  Filled 2018-11-29 (×2): qty 1

## 2018-11-29 MED ORDER — PANTOPRAZOLE SODIUM 40 MG IV SOLR
40.0000 mg | Freq: Once | INTRAVENOUS | Status: AC
  Administered 2018-11-29: 08:00:00 40 mg via INTRAVENOUS
  Filled 2018-11-29: qty 40

## 2018-11-29 MED ORDER — AMOXICILLIN-POT CLAVULANATE 875-125 MG PO TABS: 1.0000 | ORAL_TABLET | Freq: Three times a day (TID) | ORAL | 0 refills | Status: AC

## 2018-11-29 MED ORDER — ONDANSETRON HCL 4 MG PO TABS: 4.0000 mg | ORAL_TABLET | Freq: Every day | ORAL | 0 refills | Status: AC | PRN

## 2018-11-29 MED ORDER — ALUM & MAG HYDROXIDE-SIMETH 200-200-20 MG/5ML PO SUSP
30.0000 mL | Freq: Once | ORAL | Status: AC
  Administered 2018-11-29: 30 mL via ORAL
  Filled 2018-11-29: qty 30

## 2018-11-29 MED ORDER — HYDROMORPHONE HCL 1 MG/ML IJ SOLN
0.5000 mg | Freq: Once | INTRAMUSCULAR | Status: AC
  Administered 2018-11-29: 08:00:00 0.5 mg via INTRAVENOUS
  Filled 2018-11-29: qty 1

## 2018-11-29 MED ORDER — IOHEXOL 300 MG/ML  SOLN
100.0000 mL | Freq: Once | INTRAMUSCULAR | Status: AC | PRN
  Administered 2018-11-29: 09:00:00 100 mL via INTRAVENOUS

## 2018-11-29 NOTE — ED Notes (Signed)
Patient transported to CT 

## 2018-11-29 NOTE — ED Provider Notes (Signed)
[facility] Emergency Department Provider Note  ____________________________________________   First MD Initiated Contact with Patient 11/29/18 [phone]     (approximate)  I have reviewed the triage vital signs and the nursing notes.   HISTORY  Chief Complaint Abdominal Pain    HPI NOSSON WENDER is a 48 y.o. male with alcoholic pancreatitis who presents with abd pain. Pt was seen by myself on 9/1.  Pt was diagnosed with alcoholic pancreatitis with lipase of 116.  No imaging was done given prior recent imaging negative.   Pt d/c with short course of opioids.  Pt now having continued LUQ pain that is severe, constant, radiates to back, nothing makes it better nothing makes it better. NO vomiting fever. Did use ETOH last night.  Requesting pain medications.      Past Medical History:  Diagnosis Date  . GERD (gastroesophageal reflux disease)   . Hypertension   . Pancreatitis     There are no active problems to display for this patient.   Past Surgical History:  Procedure Laterality Date  . ABDOMINAL SURGERY      Prior to Admission medications   Medication Sig Start Date End Date Taking? Authorizing Provider  amLODipine (NORVASC) 10 MG tablet Take 10 mg by mouth daily.    [provider]  famotidine (PEPCID) 20 MG tablet Take 1 tablet (20 mg total) by mouth 2 (two) times daily. 10/29/18   Paulette Blanch, MD  hydrOXYzine (ATARAX/VISTARIL) 25 MG tablet Take 25 mg by mouth every 4 (four) hours as needed.    [provider]  ondansetron (ZOFRAN ODT) 4 MG disintegrating tablet Take 1 tablet (4 mg total) by mouth every 8 (eight) hours as needed for nausea or vomiting. Patient not taking: Reported on 11/20/2018 10/29/18   Paulette Blanch, MD  ondansetron (ZOFRAN) 4 MG tablet Take 1 tablet (4 mg total) by mouth daily as needed for nausea or vomiting. 11/25/18 11/25/19  Vanessa Calamus, MD  oxyCODONE (ROXICODONE) 5 MG immediate release tablet Take 1 tablet  (5 mg total) by mouth every 8 (eight) hours as needed for up to 5 days. 11/25/18 11/30/18  Vanessa Bethania, MD  oxyCODONE-acetaminophen (PERCOCET) 5-325 MG tablet Take 1 tablet by mouth every 4 (four) hours as needed for severe pain. Patient not taking: Reported on 11/20/2018 09/29/18 09/29/19  Nena Polio, MD  pantoprazole (PROTONIX) 20 MG tablet Take 1 tablet (20 mg total) by mouth daily. 11/20/18 11/20/19  Merlyn Lot, MD  promethazine (PHENERGAN) 12.5 MG tablet Take 1 tablet (12.5 mg total) by mouth every 6 (six) hours as needed. 11/20/18   Merlyn Lot, MD  traMADol (ULTRAM) 50 MG tablet Take 1 tablet (50 mg total) by mouth every 6 (six) hours as needed for severe pain. 11/20/18 11/20/19  Merlyn Lot, MD    Allergies Patient has no known allergies.  History reviewed. No pertinent family history.  Social History Social History   Tobacco Use  . Smoking status: Current Every Day Smoker    Packs/day: 1.00    Types: Cigarettes  . Smokeless tobacco: Never Used  Substance Use Topics  . Alcohol use: Yes  . Drug use: Not Currently    Types: Marijuana      Review of Systems Constitutional: No fever/chills Eyes: No visual changes. ENT: No sore throat. Cardiovascular: Denies chest pain. Respiratory: Denies shortness of breath. Gastrointestinal: + abd pain. + vomiting yesterday NBNB Genitourinary: Negative for dysuria. Musculoskeletal: Negative for back pain.  Skin: Negative for rash. Neurological: Negative for headaches, focal weakness or numbness. All other ROS negative ____________________________________________   PHYSICAL EXAM:  VITAL SIGNS: Blood pressure (!) 158/110, pulse 81, resp. rate 18, SpO2 95 %.   Constitutional: Alert and oriented. Well appearing and in no acute distress. Eyes: Conjunctivae are normal. EOMI. Head: Atraumatic. Nose: No congestion/rhinnorhea. Mouth/Throat: Mucous membranes are moist.   Neck: No stridor. Trachea Midline. FROM  Cardiovascular: Normal rate, regular rhythm. Grossly normal heart sounds.  Good peripheral circulation. Respiratory: Normal respiratory effort.  No retractions. Lungs CTAB. Gastrointestinal: LUQ abd pain. No distention. No abdominal bruits.  Musculoskeletal: No lower extremity tenderness nor edema.  No joint effusions. Neurologic:  Normal speech and language. No gross focal neurologic deficits are appreciated.  Skin:  Skin is warm, dry and intact. No rash noted. Psychiatric: Mood and affect are normal. Speech and behavior are normal. GU: Deferred   ____________________________________________   LABS (all labs ordered are listed, but only abnormal results are displayed)  Labs Reviewed  COMPREHENSIVE METABOLIC PANEL - Abnormal; Notable for the following components:      Result Value   Potassium 3.4 (*)    Glucose, Bld 111 (*)    AST 144 (*)    ALT 141 (*)    All other components within normal limits  URINALYSIS, COMPLETE (UACMP) WITH MICROSCOPIC - Abnormal; Notable for the following components:   Color, Urine STRAW (*)    APPearance CLEAR (*)    Specific Gravity, Urine 1.003 (*)    Bacteria, UA RARE (*)    All other components within normal limits  LIPASE, BLOOD  CBC   ____________________________________________  RADIOLOGY   Official radiology report(s): Ct Abdomen Pelvis W Contrast  Result Date: 11/29/2018 CLINICAL DATA:  Pt arrived via EMS from home with c/o continued abdominal pain. Pt seen on 9/1 in ED for acute pancreatitis. Pt admits to ETOH. EXAM: CT ABDOMEN AND PELVIS WITH CONTRAST TECHNIQUE: Multidetector CT imaging of the abdomen and pelvis was performed using the standard protocol following bolus administration of intravenous contrast. CONTRAST:  100mL OMNIPAQUE IOHEXOL 300 MG/ML  SOLN COMPARISON:  11/20/2018 FINDINGS: Lower chest: No acute abnormality. Hepatobiliary: Marked diffuse low-attenuation of the liver, consistent with severe hepatic steatosis. Gallbladder is  present and unremarkable in CT appearance. Pancreas: Unremarkable. No pancreatic ductal dilatation or surrounding inflammatory changes. Spleen: Normal in size without focal abnormality. Adrenals/Urinary Tract: Normal adrenal glands. Symmetric size and enhancement of both kidneys. No renal mass. No hydronephrosis. Ureters are unremarkable. The bladder and visualized portion of the urethra are normal. Stomach/Bowel: Normal appearance of the stomach. Thickened proximal jejunal loops without evidence for obstruction. Motion degraded images of the RIGHT LOWER QUADRANT. Appendix is present and contains 1-2 appendicoliths versus small amount of contrast. Otherwise the size of the appendix is normal in there is no associated inflammatory change. Colon is not obstructed. Scattered diverticula are present but there is no acute diverticulitis. Vascular/Lymphatic: No significant vascular findings are present. No enlarged abdominal or pelvic lymph nodes. Reproductive: Prostate is unremarkable. Other: Small fat containing hernia. No ascites. Musculoskeletal: No acute or significant osseous findings. IMPRESSION: 1. Marked hepatic steatosis. 2. No CT findings of pancreatitis or its complications. 3. Thickened proximal jejunal loops, consistent with enteritis. No evidence for bowel obstruction. 4. Colonic diverticulosis without acute diverticulitis. 5. Small fat containing umbilical hernia. Electronically Signed   By: Norva PavlovElizabeth  Brown M.D.   On: 11/29/2018 09:22    ____________________________________________   PROCEDURES  Procedure(s) performed (including Critical Care):  Procedures   ____________________________________________   INITIAL IMPRESSION / ASSESSMENT AND PLAN / ED COURSE  KAREY COLORADO was evaluated in Emergency Department on 11/29/2018 for the symptoms described in the history of present illness. He was evaluated in the context of the global COVID-19 pandemic, which necessitated consideration that  the patient might be at risk for infection with the SARS-CoV-2 virus that causes COVID-19. Institutional protocols and algorithms that pertain to the evaluation of patients at risk for COVID-19 are in a state of rapid change based on information released by regulatory bodies including the CDC and federal and state organizations. These policies and algorithms were followed during the patient's care in the ED.    Patient is a 48 year old male who has a history of alcohol use and pancreatitis who now presents with continued left upper quadrant pain.  I personally saw patient a few days ago for similar pain.  At that time imaging was not done given my diagnosis of pancreatitis.  However now his lipase is downtrending and is continued to have severe pain.  Will get CT imaging to rule out any other complications such as pseudocyst, chronic pancreatitis, gastric perforation, SBO.  If imaging is negative is most likely secondary to gastritis. Will give 1 dose of pains medications.   Lipase is now downtrending. LFTS around baseline. UA no evidence of UTI.   Patient requesting IV pain medication but given my concern concern for possible malingering I will hold off until we get CT imaging back.  9:41 AM CT scan is concerning for enteritis.  No evidence of pancreatitis.  Patient does report having some episodes of diarrhea daily for the past few days as well as lower abdominal cramping.  Patient is no evidence of severe dehydration given his normal heart rate and his UA without evidence of ketones.  Will give patient a course of Augmentin to see if this helps with symptoms.  Patient is had no vomiting here but does report some nausea so also give a course of Zofran.  Patient is again requesting IV pain medication.  I explained that IV pain medication would not be the solution to his pain.  I discussed that he needs to cut down his alcohol use.     ____________________________________________   FINAL CLINICAL  IMPRESSION(S) / ED DIAGNOSES   Final diagnoses:  Enteritis  Alcohol use      MEDICATIONS GIVEN DURING THIS VISIT:  Medications  pantoprazole (PROTONIX) injection 40 mg (40 mg Intravenous Given 11/29/18 0827)  sucralfate (CARAFATE) tablet 1 g (1 g Oral Given 11/29/18 0835)  alum & mag hydroxide-simeth (MAALOX/MYLANTA) 200-200-20 MG/5ML suspension 30 mL (30 mLs Oral Given 11/29/18 0825)  HYDROmorphone (DILAUDID) injection 0.5 mg (0.5 mg Intravenous Given 11/29/18 0823)  iohexol (OMNIPAQUE) 300 MG/ML solution 100 mL (100 mLs Intravenous Contrast Given 11/29/18 4765)     ED Discharge Orders    None       Note:  This document was prepared using Dragon voice recognition software and may include unintentional dictation errors.    Concha Se, MD 11/29/18 [phone]

## 2018-11-29 NOTE — ED Triage Notes (Signed)
Pt arrived via EMS from home with c/o continued abdominal pain. Pt seen on 9/1 in ED for acute pancreatitis. Pt admits to ETOH. Pt in no acute distress at this time.

## 2018-11-29 NOTE — ED Notes (Signed)
PAtient asking for more pain medicine. MD Jari Pigg made aware. Relayed to patient MD is waiting on his CT scan results and then will talk to him and re-evaluate plan.

## 2018-11-29 NOTE — Discharge Instructions (Addendum)
Your work-up was reassuring.  Your CT scan did not show any evidence of pancreatitis but did show some enteritis. You should cut down on your alcohol use.  Take the antibiotics to ensure this heals.  Take zofran for nausea.  You can f/u outpatient with GI.   Return to the ER for vomiting, fevers or any other concerns  1. Marked hepatic steatosis. 2. No CT findings of pancreatitis or its complications. 3. Thickened proximal jejunal loops, consistent with enteritis. No evidence for bowel obstruction. 4. Colonic diverticulosis without acute diverticulitis. 5. Small fat containing umbilical hernia.

## 2018-11-30 ENCOUNTER — Other Ambulatory Visit: Payer: Self-pay

## 2018-11-30 ENCOUNTER — Emergency Department: Payer: Self-pay

## 2018-11-30 ENCOUNTER — Emergency Department
Admission: EM | Admit: 2018-11-30 | Discharge: 2018-11-30 | Disposition: A | Discharge status: Home / Self Care | Payer: Self-pay | Attending: Emergency Medicine | Admitting: Emergency Medicine

## 2018-11-30 ENCOUNTER — Encounter: Payer: Self-pay | Admitting: Emergency Medicine

## 2018-11-30 DIAGNOSIS — S0083XA Contusion of other part of head, initial encounter: Secondary | ICD-10-CM

## 2018-11-30 DIAGNOSIS — Z79899 Other long term (current) drug therapy: Secondary | ICD-10-CM | POA: Insufficient documentation

## 2018-11-30 DIAGNOSIS — S20211A Contusion of right front wall of thorax, initial encounter: Secondary | ICD-10-CM | POA: Insufficient documentation

## 2018-11-30 DIAGNOSIS — I10 Essential (primary) hypertension: Secondary | ICD-10-CM | POA: Insufficient documentation

## 2018-11-30 DIAGNOSIS — Y999 Unspecified external cause status: Secondary | ICD-10-CM | POA: Insufficient documentation

## 2018-11-30 DIAGNOSIS — Y929 Unspecified place or not applicable: Secondary | ICD-10-CM | POA: Insufficient documentation

## 2018-11-30 DIAGNOSIS — F17210 Nicotine dependence, cigarettes, uncomplicated: Secondary | ICD-10-CM | POA: Insufficient documentation

## 2018-11-30 DIAGNOSIS — S20219A Contusion of unspecified front wall of thorax, initial encounter: Secondary | ICD-10-CM

## 2018-11-30 DIAGNOSIS — Y939 Activity, unspecified: Secondary | ICD-10-CM | POA: Insufficient documentation

## 2018-11-30 DIAGNOSIS — S0033XA Contusion of nose, initial encounter: Secondary | ICD-10-CM | POA: Insufficient documentation

## 2018-11-30 LAB — COMPREHENSIVE METABOLIC PANEL
ALT: 137 U/L — ABNORMAL HIGH (ref 0–44)
AST: 149 U/L — ABNORMAL HIGH (ref 15–41)
Albumin: 4.3 g/dL (ref 3.5–5.0)
Alkaline Phosphatase: 64 U/L (ref 38–126)
Anion gap: 12 (ref 5–15)
BUN: 10 mg/dL (ref 6–20)
CO2: 23 mmol/L (ref 22–32)
Calcium: 8.7 mg/dL — ABNORMAL LOW (ref 8.9–10.3)
Chloride: 104 mmol/L (ref 98–111)
Creatinine, Ser: 0.78 mg/dL (ref 0.61–1.24)
GFR calc Af Amer: >60 mL/min (ref >60)
GFR calc non Af Amer: >60 mL/min (ref >60)
Glucose, Bld: 165 mg/dL — ABNORMAL HIGH (ref 70–99)
Potassium: 3.2 mmol/L — ABNORMAL LOW (ref 3.5–5.1)
Sodium: 139 mmol/L (ref 135–145)
Total Bilirubin: 0.5 mg/dL (ref 0.3–1.2)
Total Protein: 7.4 g/dL (ref 6.5–8.1)

## 2018-11-30 LAB — CBC
HCT: 48.1 % (ref 39.0–52.0)
Hemoglobin: 16.6 g/dL (ref 13.0–17.0)
MCH: 32 pg (ref 26.0–34.0)
MCHC: 34.5 g/dL (ref 30.0–36.0)
MCV: 92.9 fL (ref 80.0–100.0)
Platelets: 193 10*3/uL (ref 150–400)
RBC: 5.18 MIL/uL (ref 4.22–5.81)
RDW: 14.6 % (ref 11.5–15.5)
WBC: 9 10*3/uL (ref 4.0–10.5)
nRBC: 0 % (ref 0.0–0.2)

## 2018-11-30 LAB — ETHANOL: Alcohol, Ethyl (B): 200 mg/dL — ABNORMAL HIGH (ref <10)

## 2018-11-30 LAB — LIPASE, BLOOD: Lipase: 45 U/L (ref 11–51)

## 2018-11-30 MED ORDER — TRAMADOL HCL 50 MG PO TABS: 50.0000 mg | ORAL_TABLET | Freq: Four times a day (QID) | ORAL | 0 refills | Status: AC | PRN

## 2018-11-30 MED ORDER — OXYCODONE-ACETAMINOPHEN 5-325 MG PO TABS
1.0000 | ORAL_TABLET | Freq: Once | ORAL | Status: AC
  Administered 2018-11-30: 05:00:00 1 via ORAL
  Filled 2018-11-30: qty 1
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
## 2018-11-30 NOTE — ED Notes (Signed)
Dr Brown in to follow up 

## 2018-11-30 NOTE — ED Notes (Signed)
Pt's oxygen level alarming on the monitor; at times drops down into the upper 80's; placed pt on 2L via [place]; rebounds up to mid 90's; pt did awaken and say he's not feeling well; fell back asleep before leaving his room;
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
## 2018-11-30 NOTE — ED Notes (Signed)
Pt sitting on the end of the bed; has used the phone the ED tech has given him and called his wife to come pick him up; he says she will be here shortly;

## 2018-12-19 ENCOUNTER — Other Ambulatory Visit: Payer: Self-pay

## 2018-12-19 ENCOUNTER — Emergency Department: Payer: Self-pay

## 2018-12-19 ENCOUNTER — Emergency Department
Admission: EM | Admit: 2018-12-19 | Discharge: 2018-12-19 | Disposition: A | Discharge status: Home / Self Care | Payer: Self-pay | Attending: Emergency Medicine | Admitting: Emergency Medicine

## 2018-12-19 ENCOUNTER — Encounter: Payer: Self-pay | Admitting: Emergency Medicine

## 2018-12-19 DIAGNOSIS — F17210 Nicotine dependence, cigarettes, uncomplicated: Secondary | ICD-10-CM | POA: Insufficient documentation

## 2018-12-19 DIAGNOSIS — R0781 Pleurodynia: Secondary | ICD-10-CM

## 2018-12-19 DIAGNOSIS — Z79899 Other long term (current) drug therapy: Secondary | ICD-10-CM | POA: Insufficient documentation

## 2018-12-19 DIAGNOSIS — I10 Essential (primary) hypertension: Secondary | ICD-10-CM | POA: Insufficient documentation

## 2018-12-19 DIAGNOSIS — S2241XD Multiple fractures of ribs, right side, subsequent encounter for fracture with routine healing: Secondary | ICD-10-CM | POA: Insufficient documentation

## 2018-12-19 MED ORDER — OXYCODONE-ACETAMINOPHEN 5-325 MG PO TABS: 1.0000 | ORAL_TABLET | ORAL | 0 refills | Status: DC | PRN

## 2018-12-19 MED ORDER — OXYCODONE-ACETAMINOPHEN 5-325 MG PO TABS
1.0000 | ORAL_TABLET | Freq: Once | ORAL | Status: AC
  Administered 2018-12-19: 1 via ORAL
  Filled 2018-12-19: qty 1

## 2018-12-19 NOTE — Discharge Instructions (Addendum)
1.  You may take Percocet as needed for pain. 2.  Use incentive spirometer as instructed. 3.  Return to the ER for worsening symptoms, persistent vomiting, fever, difficulty breathing or other concerns.
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
## 2018-12-19 NOTE — ED Notes (Signed)
Pt to xray via stretcher accomp by xray tech 
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
## 2019-01-06 ENCOUNTER — Encounter: Payer: Self-pay | Admitting: Emergency Medicine

## 2019-01-06 ENCOUNTER — Other Ambulatory Visit: Payer: Self-pay

## 2019-01-06 ENCOUNTER — Inpatient Hospital Stay
Admission: EM | Admit: 2019-01-06 | Discharge: 2019-01-10 | DRG: 439 | Disposition: A | Discharge status: Home / Self Care | Payer: Self-pay | Attending: Internal Medicine | Admitting: Internal Medicine

## 2019-01-06 DIAGNOSIS — F10220 Alcohol dependence with intoxication, uncomplicated: Secondary | ICD-10-CM | POA: Diagnosis present

## 2019-01-06 DIAGNOSIS — Y908 Blood alcohol level of 240 mg/100 ml or more: Secondary | ICD-10-CM | POA: Diagnosis present

## 2019-01-06 DIAGNOSIS — F10229 Alcohol dependence with intoxication, unspecified: Secondary | ICD-10-CM

## 2019-01-06 DIAGNOSIS — F10239 Alcohol dependence with withdrawal, unspecified: Secondary | ICD-10-CM | POA: Diagnosis present

## 2019-01-06 DIAGNOSIS — I10 Essential (primary) hypertension: Secondary | ICD-10-CM | POA: Diagnosis present

## 2019-01-06 DIAGNOSIS — K852 Alcohol induced acute pancreatitis without necrosis or infection: Principal | ICD-10-CM | POA: Diagnosis present

## 2019-01-06 DIAGNOSIS — F17210 Nicotine dependence, cigarettes, uncomplicated: Secondary | ICD-10-CM | POA: Diagnosis present

## 2019-01-06 DIAGNOSIS — Z23 Encounter for immunization: Secondary | ICD-10-CM

## 2019-01-06 DIAGNOSIS — K2920 Alcoholic gastritis without bleeding: Secondary | ICD-10-CM | POA: Diagnosis present

## 2019-01-06 DIAGNOSIS — Z79899 Other long term (current) drug therapy: Secondary | ICD-10-CM

## 2019-01-06 DIAGNOSIS — Z20828 Contact with and (suspected) exposure to other viral communicable diseases: Secondary | ICD-10-CM | POA: Diagnosis present

## 2019-01-06 DIAGNOSIS — K7010 Alcoholic hepatitis without ascites: Secondary | ICD-10-CM | POA: Diagnosis present

## 2019-01-06 DIAGNOSIS — K860 Alcohol-induced chronic pancreatitis: Secondary | ICD-10-CM | POA: Diagnosis present

## 2019-01-06 LAB — URINALYSIS, COMPLETE (UACMP) WITH MICROSCOPIC
Bacteria, UA: NONE SEEN
Bilirubin Urine: NEGATIVE
Glucose, UA: NEGATIVE mg/dL
Ketones, ur: NEGATIVE mg/dL
Leukocytes,Ua: NEGATIVE
Nitrite: NEGATIVE
Protein, ur: NEGATIVE mg/dL
Specific Gravity, Urine: 1.002 — ABNORMAL LOW (ref 1.005–1.030)
Squamous Epithelial / HPF: NONE SEEN (ref 0–5)
WBC, UA: NONE SEEN WBC/hpf (ref 0–5)
pH: 6 (ref 5.0–8.0)

## 2019-01-06 LAB — COMPREHENSIVE METABOLIC PANEL
ALT: 157 U/L — ABNORMAL HIGH (ref 0–44)
ALT: 142 U/L — ABNORMAL HIGH (ref 0–44)
AST: 142 U/L — ABNORMAL HIGH (ref 15–41)
AST: 131 U/L — ABNORMAL HIGH (ref 15–41)
Albumin: 4.3 g/dL (ref 3.5–5.0)
Albumin: 3.9 g/dL (ref 3.5–5.0)
Alkaline Phosphatase: 62 U/L (ref 38–126)
Alkaline Phosphatase: 56 U/L (ref 38–126)
Anion gap: 13 (ref 5–15)
Anion gap: 9 (ref 5–15)
BUN: 5 mg/dL — ABNORMAL LOW (ref 6–20)
BUN: 5 mg/dL — ABNORMAL LOW (ref 6–20)
CO2: 24 mmol/L (ref 22–32)
CO2: 23 mmol/L (ref 22–32)
Calcium: 9.1 mg/dL (ref 8.9–10.3)
Calcium: 8.4 mg/dL — ABNORMAL LOW (ref 8.9–10.3)
Chloride: 98 mmol/L (ref 98–111)
Chloride: 105 mmol/L (ref 98–111)
Creatinine, Ser: 0.78 mg/dL (ref 0.61–1.24)
Creatinine, Ser: 0.74 mg/dL (ref 0.61–1.24)
GFR calc Af Amer: >60 mL/min (ref >60)
GFR calc Af Amer: >60 mL/min (ref >60)
GFR calc non Af Amer: >60 mL/min (ref >60)
GFR calc non Af Amer: >60 mL/min (ref >60)
Glucose, Bld: 130 mg/dL — ABNORMAL HIGH (ref 70–99)
Glucose, Bld: 99 mg/dL (ref 70–99)
Potassium: 3.4 mmol/L — ABNORMAL LOW (ref 3.5–5.1)
Potassium: 4.1 mmol/L (ref 3.5–5.1)
Sodium: 135 mmol/L (ref 135–145)
Sodium: 137 mmol/L (ref 135–145)
Total Bilirubin: 0.8 mg/dL (ref 0.3–1.2)
Total Bilirubin: 0.6 mg/dL (ref 0.3–1.2)
Total Protein: 7.7 g/dL (ref 6.5–8.1)
Total Protein: 7.1 g/dL (ref 6.5–8.1)

## 2019-01-06 LAB — CBC
HCT: 48.8 % (ref 39.0–52.0)
Hemoglobin: 17.1 g/dL — ABNORMAL HIGH (ref 13.0–17.0)
MCH: 32.4 pg (ref 26.0–34.0)
MCHC: 35 g/dL (ref 30.0–36.0)
MCV: 92.4 fL (ref 80.0–100.0)
Platelets: 201 10*3/uL (ref 150–400)
RBC: 5.28 MIL/uL (ref 4.22–5.81)
RDW: 13.3 % (ref 11.5–15.5)
WBC: 6.3 10*3/uL (ref 4.0–10.5)
nRBC: 0 % (ref 0.0–0.2)

## 2019-01-06 LAB — LIPASE, BLOOD: Lipase: 120 U/L — ABNORMAL HIGH (ref 11–51)

## 2019-01-06 LAB — PHOSPHORUS: Phosphorus: 4.5 mg/dL (ref 2.5–4.6)

## 2019-01-06 LAB — MAGNESIUM: Magnesium: 2 mg/dL (ref 1.7–2.4)

## 2019-01-06 LAB — ETHANOL: Alcohol, Ethyl (B): 304 mg/dL (ref <10)

## 2019-01-06 MED ORDER — VITAMIN B-1 100 MG PO TABS
100.0000 mg | ORAL_TABLET | Freq: Every day | ORAL | Status: DC
  Administered 2019-01-07: 09:00:00 100 mg via ORAL
  Filled 2019-01-06: qty 1

## 2019-01-06 MED ORDER — HYDROMORPHONE HCL 1 MG/ML IJ SOLN
0.5000 mg | INTRAMUSCULAR | Status: DC | PRN
  Administered 2019-01-06 – 2019-01-07 (×4): 0.5 mg via INTRAVENOUS
  Filled 2019-01-06 (×3): qty 0.5

## 2019-01-06 MED ORDER — SODIUM CHLORIDE 0.9 % IV BOLUS
1000.0000 mL | Freq: Once | INTRAVENOUS | Status: AC
  Administered 2019-01-06: 20:00:00 1000 mL via INTRAVENOUS

## 2019-01-06 MED ORDER — ADULT MULTIVITAMIN W/MINERALS CH
1.0000 | ORAL_TABLET | Freq: Every day | ORAL | Status: DC
  Administered 2019-01-07 – 2019-01-10 (×4): 1 via ORAL
  Filled 2019-01-06 (×4): qty 1

## 2019-01-06 MED ORDER — FOLIC ACID 1 MG PO TABS
1.0000 mg | ORAL_TABLET | Freq: Every day | ORAL | Status: DC
  Administered 2019-01-07: 1 mg via ORAL
  Filled 2019-01-06: qty 1

## 2019-01-06 MED ORDER — HYDROMORPHONE HCL 1 MG/ML IJ SOLN
INTRAMUSCULAR | Status: AC
  Filled 2019-01-06: qty 1

## 2019-01-06 MED ORDER — HYDROMORPHONE HCL 1 MG/ML IJ SOLN
1.0000 mg | Freq: Once | INTRAMUSCULAR | Status: AC
  Administered 2019-01-06: 22:00:00 1 mg via INTRAVENOUS
  Filled 2019-01-06: qty 1

## 2019-01-06 MED ORDER — HYDROMORPHONE HCL 1 MG/ML IJ SOLN
1.0000 mg | Freq: Once | INTRAMUSCULAR | Status: AC
  Administered 2019-01-06: 20:00:00 1 mg via INTRAVENOUS
  Filled 2019-01-06: qty 1

## 2019-01-06 MED ORDER — PANTOPRAZOLE SODIUM 40 MG IV SOLR
40.0000 mg | Freq: Once | INTRAVENOUS | Status: AC
  Administered 2019-01-06: 20:00:00 40 mg via INTRAVENOUS
  Filled 2019-01-06: qty 40

## 2019-01-06 MED ORDER — HYDROCODONE-ACETAMINOPHEN 7.5-325 MG PO TABS
1.0000 | ORAL_TABLET | Freq: Four times a day (QID) | ORAL | Status: DC | PRN
  Administered 2019-01-07 – 2019-01-09 (×7): 1 via ORAL
  Filled 2019-01-06 (×7): qty 1

## 2019-01-06 MED ORDER — SODIUM CHLORIDE 0.9 % IV SOLN
INTRAVENOUS | Status: DC
  Administered 2019-01-07 – 2019-01-10 (×11): via INTRAVENOUS

## 2019-01-06 MED ORDER — ONDANSETRON HCL 4 MG/2ML IJ SOLN
4.0000 mg | Freq: Once | INTRAMUSCULAR | Status: AC
  Administered 2019-01-06: 20:00:00 4 mg via INTRAVENOUS
  Filled 2019-01-06: qty 2

## 2019-01-06 MED ORDER — PANTOPRAZOLE SODIUM 40 MG IV SOLR
40.0000 mg | Freq: Two times a day (BID) | INTRAVENOUS | Status: DC
  Administered 2019-01-07 – 2019-01-10 (×8): 40 mg via INTRAVENOUS
  Filled 2019-01-06 (×9): qty 40

## 2019-01-06 NOTE — ED Notes (Signed)
Pt unable to sit still while sitting on the side of the bed. Pt reporting centralized chest pain and upper abd pain wrapping around to his back. Pt grabbing chest intermittently and reporting sharp pains. Pt has a dry cough at this time. Pt tender upon palpation and withdrawling from pressure when applied to his chest.
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
## 2019-01-06 NOTE — ED Provider Notes (Signed)
[facility] Emergency Department Provider Note  ____________________________________________  Time seen: Approximately 10:42 PM  I have reviewed the triage vital signs and the nursing notes.   HISTORY  Chief Complaint Abdominal Pain    HPI KARRINGTON STUDNICKA is a 48 y.o. male with a history of GERD hypertension pancreatitis and alcohol abuse who comes the ED complaining of upper abdominal pain radiating to the back, constant, severe, worse with movement, no alleviating factors.  Reports that he drinks heavily every day, and "today was a bad day" and he was drinking as recently as just prior to his arrival in the ED.  Reports worsening abdominal pain over the past 2 days.  Reports an episode of hematemesis at home and feels that his stool has been black and bloody.      Past Medical History:  Diagnosis Date  . GERD (gastroesophageal reflux disease)   . Hypertension   . Pancreatitis      There are no active problems to display for this patient.    Past Surgical History:  Procedure Laterality Date  . ABDOMINAL SURGERY       Prior to Admission medications   Medication Sig Start Date End Date Taking? Authorizing Provider  amLODipine (NORVASC) 10 MG tablet Take 10 mg by mouth daily.    [provider]  famotidine (PEPCID) 20 MG tablet Take 1 tablet (20 mg total) by mouth 2 (two) times daily. 10/29/18   Paulette Blanch, MD  hydrOXYzine (ATARAX/VISTARIL) 25 MG tablet Take 25 mg by mouth every 4 (four) hours as needed.    [provider]  ondansetron (ZOFRAN) 4 MG tablet Take 1 tablet (4 mg total) by mouth daily as needed for nausea or vomiting. 11/29/18 11/29/19  Vanessa Amelia, MD  oxyCODONE-acetaminophen (PERCOCET/ROXICET) 5-325 MG tablet Take 1 tablet by mouth every 4 (four) hours as needed for severe pain. 12/19/18   Paulette Blanch, MD  pantoprazole (PROTONIX) 20 MG tablet Take 1 tablet (20 mg total) by mouth daily. 11/20/18 11/20/19  Merlyn Lot, MD  promethazine (PHENERGAN) 12.5 MG tablet Take 1 tablet (12.5 mg total) by mouth every 6 (six) hours as needed. 11/20/18   Merlyn Lot, MD  traMADol (ULTRAM) 50 MG tablet Take 1 tablet (50 mg total) by mouth every 6 (six) hours as needed for severe pain. 11/20/18 11/20/19  Merlyn Lot, MD  traMADol (ULTRAM) 50 MG tablet Take 1 tablet (50 mg total) by mouth every 6 (six) hours as needed. 11/30/18 11/30/19  Gregor Hams, MD     Allergies Patient has no known allergies.   No family history on file.  Social History Social History   Tobacco Use  . Smoking status: Current Every Day Smoker    Packs/day: 1.00    Types: Cigarettes  . Smokeless tobacco: Never Used  Substance Use Topics  . Alcohol use: Yes  . Drug use: Not Currently    Types: Marijuana    Review of Systems  Constitutional:   No fever or chills.  ENT:   No sore throat. No rhinorrhea. Cardiovascular:   No chest pain or syncope. Respiratory:   No dyspnea or cough. Gastrointestinal:   Positive as above for abdominal pain vomiting. Musculoskeletal:   Negative for focal pain or swelling All other systems reviewed and are negative except as documented above in ROS and HPI.  ____________________________________________   PHYSICAL EXAM:  VITAL SIGNS: ED Triage Vitals  Enc Vitals Group     BP 01/06/19  1950 (!) 120/108     Pulse Rate 01/06/19 1950 (!) 107     Resp 01/06/19 1950 20     Temp 01/06/19 1950 98.8 F (37.1 C)     Temp Source 01/06/19 1950 Oral     SpO2 01/06/19 1950 96 %     Weight 01/06/19 1951 220 lb (99.8 kg)     Height 01/06/19 1951 5\' 11"  (1.803 m)     Head Circumference --      Peak Flow --      Pain Score 01/06/19 2009 10     Pain Loc --      Pain Edu? --      Excl. in GC? --     Vital signs reviewed, nursing assessments reviewed.   Constitutional:   Alert and oriented. Non-toxic appearance.  Intoxicated Eyes:   Conjunctivae are normal. EOMI. PERRL. ENT      Head:    Normocephalic and atraumatic.      Nose:   Wearing a mask.      Mouth/Throat:   Wearing a mask.      Neck:   No meningismus. Full ROM. Hematological/Lymphatic/Immunilogical:   No cervical lymphadenopathy. Cardiovascular:   Tachycardia heart rate 105. Symmetric bilateral radial and DP pulses.  No murmurs. Cap refill less than 2 seconds. Respiratory:   Normal respiratory effort without tachypnea/retractions. Breath sounds are clear and equal bilaterally. No wheezes/rales/rhonchi. Gastrointestinal:   Soft with pronounced epigastric tenderness.  Non distended. There is no CVA tenderness.  No rebound, rigidity, or guarding.  Rectal exam reveals scant brown stool, Hemoccult negative  Musculoskeletal:   Normal range of motion in all extremities. No joint effusions.  No lower extremity tenderness.  No edema. Neurologic:   Normal speech and language.  Motor grossly intact. No acute focal neurologic deficits are appreciated.  Skin:    Skin is warm, dry and intact. No rash noted.  No petechiae, purpura, or bullae.  ____________________________________________    LABS (pertinent positives/negatives) (all labs ordered are listed, but only abnormal results are displayed) Labs Reviewed  LIPASE, BLOOD - Abnormal; Notable for the following components:      Result Value   Lipase 120 (*)    All other components within normal limits  COMPREHENSIVE METABOLIC PANEL - Abnormal; Notable for the following components:   Potassium 3.4 (*)    Glucose, Bld 130 (*)    BUN 5 (*)    AST 142 (*)    ALT 157 (*)    All other components within normal limits  CBC - Abnormal; Notable for the following components:   Hemoglobin 17.1 (*)    All other components within normal limits  URINALYSIS, COMPLETE (UACMP) WITH MICROSCOPIC - Abnormal; Notable for the following components:   Color, Urine COLORLESS (*)    APPearance CLEAR (*)    Specific Gravity, Urine 1.002 (*)    Hgb urine dipstick SMALL (*)    All other  components within normal limits  ETHANOL - Abnormal; Notable for the following components:   Alcohol, Ethyl (B) 304 (*)    All other components within normal limits  SARS CORONAVIRUS 2 (TAT 6-24 HRS)   ____________________________________________   EKG  Interpreted by me Sinus rhythm rate of 98, normal axis and intervals.  Normal QRS ST segments and T waves.  ____________________________________________    RADIOLOGY  No results found.  ____________________________________________   PROCEDURES Procedures  ____________________________________________  DIFFERENTIAL DIAGNOSIS   Alcohol intoxication, pancreatitis, alcoholic hepatitis, gastritis  CLINICAL  IMPRESSION / ASSESSMENT AND PLAN / ED COURSE  Medications ordered in the ED: Medications  sodium chloride 0.9 % bolus 1,000 mL (0 mLs Intravenous Stopped 01/06/19 2105)  HYDROmorphone (DILAUDID) injection 1 mg (1 mg Intravenous Given 01/06/19 2027)  ondansetron (ZOFRAN) injection 4 mg (4 mg Intravenous Given 01/06/19 2028)  pantoprazole (PROTONIX) injection 40 mg (40 mg Intravenous Given 01/06/19 2028)  HYDROmorphone (DILAUDID) injection 1 mg (1 mg Intravenous Given 01/06/19 2138)    Pertinent labs & imaging results that were available during my care of the patient were reviewed by me and considered in my medical decision making (see chart for details).  Judge StallMichael K Korman was evaluated in Emergency Department on 01/06/2019 for the symptoms described in the history of present illness. He was evaluated in the context of the global COVID-19 pandemic, which necessitated consideration that the patient might be at risk for infection with the SARS-CoV-2 virus that causes COVID-19. Institutional protocols and algorithms that pertain to the evaluation of patients at risk for COVID-19 are in a state of rapid change based on information released by regulatory bodies including the CDC and federal and state organizations. These policies  and algorithms were followed during the patient's care in the ED.     Clinical Course as of Jan 05 2241  Tue Jan 06, 2019  2020 Patient presents with severe epigastric pain concerning for recurrent pancreatitis in the setting of alcohol abuse.  Will give Dilaudid 1 mg IV, Zofran 4 mg IV, Protonix 40 mg IV for pain and nausea relief as well as therapy for gastritis, IV fluids for hydration while waiting for labs.   [PS]    Clinical Course User Index [PS] Sharman CheekStafford, Yehoshua Vitelli, MD    ----------------------------------------- 10:45 PM on 01/06/2019 -----------------------------------------  Exam negative for evidence of GI bleed, hemoglobin stable.  Heart rate improved after IV fluids.  Patient still has 7/2:10 doses of 1 mg Dilaudid IV.  We will plan to admit for pain control, hydration and treatment of alcoholic pancreatitis.  Patient also reports that he is currently motivated to begin a detox process and he hopes to do much more with his life then be in a pattern of alcohol abuse and dependence.  I discussed with the hospitalist for further management.   ____________________________________________   FINAL CLINICAL IMPRESSION(S) / ED DIAGNOSES    Final diagnoses:  Alcohol dependence with intoxication with complication (HCC)  Alcohol-induced acute pancreatitis, unspecified complication status  Alcoholic hepatitis without ascites     ED Discharge Orders    None      Portions of this note were generated with dragon dictation software. Dictation errors may occur despite best attempts at proofreading.   Sharman CheekStafford, Lillyth Spong, MD 01/06/19 2245

## 2019-01-06 NOTE — ED Notes (Signed)
ED TO INPATIENT HANDOFF REPORT  ED Nurse Name and Phone #: Quillian Quince [place] Name/Age/Gender Kristopher [name] 48 y.o. male Room/Bed: ED14A/ED14A  Code Status   Code Status: Full Code  Home/SNF/Other Home Patient oriented to: self, place, time and situation Is this baseline? Yes   Triage Complete: Triage complete  Chief Complaint abd pain   Triage Note Patient to ER for c/o abd pain and vomiting blood. Patient unable to sit still, had to be almost pulled out of vehicle. Patient has h/o pancreatitis. +ETOH.    Allergies No Known Allergies  Level of Care/Admitting Diagnosis ED Disposition    ED Disposition Condition [facility] Area: [place] [[id number]]  Level of Care: Med-Surg [16]  Covid Evaluation: Asymptomatic Screening Protocol (No Symptoms)  Diagnosis: Acute alcoholic pancreatitis [630160]  Admitting Physician: Lang Snow [[id number]]  Attending Physician: Rufina Falco ACHIENG [[id number]]  Estimated length of stay: past midnight tomorrow  Certification:: I certify this patient will need inpatient services for at least 2 midnights  PT Class (Do Not Modify): Inpatient [101]  PT Acc Code (Do Not Modify): Private [1]       B Medical/Surgery History Past Medical History:  Diagnosis Date  . GERD (gastroesophageal reflux disease)   . Hypertension   . Pancreatitis    Past Surgical History:  Procedure Laterality Date  . ABDOMINAL SURGERY       A IV Location/Drains/Wounds Patient Lines/Drains/Airways Status   Active Line/Drains/Airways    Name:   Placement date:   Placement time:   Site:   Days:   Peripheral IV 01/06/19 Left Forearm   01/06/19    2132    Forearm   less than 1          Intake/Output Last 24 hours  Intake/Output Summary (Last 24 hours) at 01/06/2019 2320 Last data filed at 01/06/2019 2105 Gross per 24 hour  Intake 800 ml  Output -  Net 800 ml    Labs/Imaging Results for orders  placed or performed during the hospital encounter of 01/06/19 (from the past 48 hour(s))  Lipase, blood     Status: Abnormal   Collection Time: 01/06/19  7:55 PM  Result Value Ref Range   Lipase 120 (H) 11 - 51 U/L    Comment: Performed at [facility], [address] [place], [place] [address]  Comprehensive metabolic panel     Status: Abnormal   Collection Time: 01/06/19  7:55 PM  Result Value Ref Range   Sodium 135 135 - 145 mmol/L   Potassium 3.4 (L) 3.5 - 5.1 mmol/L   Chloride 98 98 - 111 mmol/L   CO2 24 22 - 32 mmol/L   Glucose, Bld 130 (H) 70 - 99 mg/dL   BUN 5 (L) 6 - 20 mg/dL   Creatinine, Ser 0.78 0.61 - 1.24 mg/dL   Calcium 9.1 8.9 - 10.3 mg/dL   Total Protein 7.7 6.5 - 8.1 g/dL   Albumin 4.3 3.5 - 5.0 g/dL   AST 142 (H) 15 - 41 U/L   ALT 157 (H) 0 - 44 U/L   Alkaline Phosphatase 62 38 - 126 U/L   Total Bilirubin 0.8 0.3 - 1.2 mg/dL   GFR calc non Af Amer >60 >60 mL/min   GFR calc Af Amer >60 >60 mL/min   Anion gap 13 5 - 15    Comment: Performed at [facility], [address] [place], [place] [address]  CBC     Status: Abnormal   Collection Time: 01/06/19  7:55 PM  Result Value Ref Range   WBC 6.3 4.0 - 10.5 K/uL   RBC 5.28 4.22 - 5.81 MIL/uL   Hemoglobin 17.1 (H) 13.0 - 17.0 g/dL   HCT 42.3 53.6 - 14.4 %   MCV 92.4 80.0 - 100.0 fL   MCH 32.4 26.0 - 34.0 pg   MCHC 35.0 30.0 - 36.0 g/dL   RDW 31.5 40.0 - 86.7 %   Platelets 201 150 - 400 K/uL   nRBC 0.0 0.0 - 0.2 %    Comment: Performed at [facility], [address] [place], [place] [address]  Ethanol     Status: Abnormal   Collection Time: 01/06/19  7:57 PM  Result Value Ref Range   Alcohol, Ethyl (B) 304 (HH) <10 mg/dL    Comment: CRITICAL RESULT CALLED TO, READ BACK BY AND VERIFIED WITH REBECCA BUTLER RN AT 2022 01/06/2019 SNG (NOTE) Lowest detectable limit for serum alcohol is 10 mg/dL. For medical purposes only. Performed at [facility], [address] [place], [place] [address]   Urinalysis, Complete w Microscopic     Status: Abnormal   Collection Time: 01/06/19  9:06 PM  Result Value Ref Range   Color, Urine COLORLESS (A) YELLOW   APPearance CLEAR (A) CLEAR   Specific Gravity, Urine 1.002 (L) 1.005 - 1.030   pH 6.0 5.0 - 8.0   Glucose, UA NEGATIVE NEGATIVE mg/dL   Hgb urine dipstick SMALL (A) NEGATIVE   Bilirubin Urine NEGATIVE NEGATIVE   Ketones, ur NEGATIVE NEGATIVE mg/dL   Protein, ur NEGATIVE NEGATIVE mg/dL   Nitrite NEGATIVE NEGATIVE   Leukocytes,Ua NEGATIVE NEGATIVE   WBC, UA NONE SEEN 0 - 5 WBC/hpf   Bacteria, UA NONE SEEN NONE SEEN   Squamous Epithelial / LPF NONE SEEN 0 - 5    Comment: Performed at [facility], [address] [place], [place] [address]   No results found.  Pending Labs Unresulted Labs (From admission, onward)    Start     Ordered   01/07/19 0500  Comprehensive metabolic panel  Tomorrow morning,   STAT     01/06/19 2247   01/06/19 2244  Comprehensive metabolic panel  Once,   STAT     01/06/19 2247   01/06/19 2244  Magnesium  Once,   STAT     01/06/19 2247   01/06/19 2244  Phosphorus  Once,   STAT     01/06/19 2247   01/06/19 2243  SARS CORONAVIRUS 2 (TAT 6-24 HRS) Nasopharyngeal Nasopharyngeal Swab  (Asymptomatic/Tier 2 Patients Labs)  Once,   STAT    Question Answer Comment  Is this test for diagnosis or screening Screening   Symptomatic for COVID-19 as defined by CDC No   Hospitalized for COVID-19 No   Admitted to ICU for COVID-19 No   Previously tested for COVID-19 No   Resident in a congregate (group) care setting No   Employed in healthcare setting No      01/06/19 2242   01/06/19 2241  HIV Antibody (routine testing w rflx)  (HIV Antibody (Routine testing w reflex) panel)  Once,   STAT     01/06/19 2247   01/06/19 2241  HIV4GL Save Tube  (HIV Antibody (Routine testing w reflex) panel)  Once,   STAT     01/06/19 2247          Vitals/Pain Today's Vitals  01/06/19  2200 01/06/19 2208 01/06/19 2215 01/06/19 2302  BP:    (!) 140/93  Pulse: 79  74 83  Resp:    17  Temp:      TempSrc:      SpO2: 96%  94% 95%  Weight:      Height:      PainSc:  7       Isolation Precautions No active isolations  Medications Medications  0.9 %  sodium chloride infusion (has no administration in time range)  thiamine (VITAMIN B-1) tablet 100 mg (has no administration in time range)  pantoprazole (PROTONIX) injection 40 mg (has no administration in time range)  HYDROcodone-acetaminophen (NORCO) 7.5-325 MG per tablet 1 tablet (has no administration in time range)  HYDROmorphone (DILAUDID) injection 0.5 mg (0.5 mg Intravenous Given 01/06/19 2304)  folic acid (FOLVITE) tablet 1 mg (has no administration in time range)  multivitamin with minerals tablet 1 tablet (has no administration in time range)  sodium chloride 0.9 % bolus 1,000 mL (0 mLs Intravenous Stopped 01/06/19 2105)  HYDROmorphone (DILAUDID) injection 1 mg (1 mg Intravenous Given 01/06/19 2027)  ondansetron (ZOFRAN) injection 4 mg (4 mg Intravenous Given 01/06/19 2028)  pantoprazole (PROTONIX) injection 40 mg (40 mg Intravenous Given 01/06/19 2028)  HYDROmorphone (DILAUDID) injection 1 mg (1 mg Intravenous Given 01/06/19 2138)    Mobility walks Low fall risk   Focused Assessments CIWA   R Recommendations: See Admitting Provider Note  Report given to:   Additional Notes:

## 2019-01-06 NOTE — ED Notes (Signed)
Pt refusing to wear cardiac monitor, oxygen saturation monitor or BP cuff at this time.

## 2019-01-06 NOTE — ED Notes (Signed)
Pt reports "throwing up blood" 2x day. Denies drinking/eating anything red today.  Pt reports drinking 8 beers today. Pt has been cursing that he is in the hospital

## 2019-01-06 NOTE — ED Notes (Addendum)
Date and time results received: 01/06/19  Test: Alcohol Critical Value: 304  Name of Provider Notified: Joni Fears

## 2019-01-06 NOTE — ED Notes (Signed)
RN attempted to call pts wife without success. Pt requesting his wife at the bedside. Pt updated his wife did not answer at this time.

## 2019-01-06 NOTE — H&P (Signed)
[facility] - [place] [facility]   PATIENT NAME: Kristopher [name]    MR#:  [id number]  DATE OF BIRTH:  [birth year]  DATE OF ADMISSION:  01/06/2019  PRIMARY CARE PHYSICIAN: Patient, No Pcp Per   REQUESTING/REFERRING PHYSICIAN: Sharman Cheek MD  CHIEF COMPLAINT:   Chief Complaint  Patient presents with  . Abdominal Pain    HISTORY OF PRESENT ILLNESS:  48 y.o. male with pertinent past medical history of EtOH abuse, alcoholic pancreatitis, tobacco abuse, and hypertension presenting to the ED with chief complaints of abdominal pain and hematemesis.  Patient report onset of symptoms following binge drinking two days ago. He has been drinking since morning when he developed severe epigastric pain radiating into his back and wraps around the sides. He reports episode of hematemesis at home and feels that his stool has been black and bloody.  Patient states he has history of alcohol abuse and has been evaluated in the ED several times for similar episode.  Patient states he has significantly decreased alcohol consumption recently and that he used to drink daily, and now will binge drink about once or twice per week. Today he thinks he may have consumed about 12 cans of beer prior to onset of symptoms.  On arrival to the ED, he was afebrile with blood pressure 120/108 mm Hg and pulse rate 107 beats/min. There were no focal neurological deficits; he was alert and oriented x4, and he did not demonstrate any memory deficits.  Labs revealed AST 131, ALT 142, lipase 120, EtOH 304 mg/dL He had recent CT abdomen/pelvis w/ contrast which showed no findings of pancreatitis or its complications.  Hospitalist asked to admit for pain management and IV fluids hydration.  PAST MEDICAL HISTORY:   Past Medical History:  Diagnosis Date  . GERD (gastroesophageal reflux disease)   . Hypertension   . Pancreatitis     PAST SURGICAL HISTORY:   Past Surgical History:  Procedure  Laterality Date  . ABDOMINAL SURGERY      SOCIAL HISTORY:   Social History   Tobacco Use  . Smoking status: Current Every Day Smoker    Packs/day: 1.00    Types: Cigarettes  . Smokeless tobacco: Never Used  Substance Use Topics  . Alcohol use: Yes    FAMILY HISTORY:  No family history on file.  DRUG ALLERGIES:  No Known Allergies  REVIEW OF SYSTEMS:   Review of Systems  Constitutional: Negative for chills, fever, malaise/fatigue and weight loss.  HENT: Negative for congestion, hearing loss and sore throat.   Eyes: Negative for blurred vision and double vision.  Respiratory: Negative for cough, shortness of breath and wheezing.   Cardiovascular: Negative for chest pain, palpitations, orthopnea and leg swelling.  Gastrointestinal: Positive for abdominal pain. Negative for diarrhea, nausea and vomiting.       Hematemesis  Genitourinary: Positive for flank pain. Negative for dysuria and urgency.  Musculoskeletal: Positive for back pain. Negative for myalgias.  Skin: Negative for rash.  Neurological: Negative for dizziness, sensory change, speech change, focal weakness and headaches.  Psychiatric/Behavioral: Positive for substance abuse. Negative for depression.   MEDICATIONS AT HOME:   Prior to Admission medications   Medication Sig Start Date End Date Taking? Authorizing Provider  amLODipine (NORVASC) 10 MG tablet Take 10 mg by mouth daily.    [provider]  famotidine (PEPCID) 20 MG tablet Take 1 tablet (20 mg total) by mouth 2 (two) times daily. 10/29/18   Irean Hong,  MD  hydrOXYzine (ATARAX/VISTARIL) 25 MG tablet Take 25 mg by mouth every 4 (four) hours as needed.    [provider]  ondansetron (ZOFRAN) 4 MG tablet Take 1 tablet (4 mg total) by mouth daily as needed for nausea or vomiting. 11/29/18 11/29/19  Vanessa San Isidro, MD  oxyCODONE-acetaminophen (PERCOCET/ROXICET) 5-325 MG tablet Take 1 tablet by mouth every 4 (four) hours as needed for severe  pain. 12/19/18   Paulette Blanch, MD  pantoprazole (PROTONIX) 20 MG tablet Take 1 tablet (20 mg total) by mouth daily. 11/20/18 11/20/19  Merlyn Lot, MD  promethazine (PHENERGAN) 12.5 MG tablet Take 1 tablet (12.5 mg total) by mouth every 6 (six) hours as needed. 11/20/18   Merlyn Lot, MD  traMADol (ULTRAM) 50 MG tablet Take 1 tablet (50 mg total) by mouth every 6 (six) hours as needed for severe pain. 11/20/18 11/20/19  Merlyn Lot, MD  traMADol (ULTRAM) 50 MG tablet Take 1 tablet (50 mg total) by mouth every 6 (six) hours as needed. 11/30/18 11/30/19  Gregor Hams, MD     VITAL SIGNS:  Blood pressure (!) 120/108, pulse 74, temperature 98.8 F (37.1 C), temperature source Oral, resp. rate 20, height 5\' 11"  (1.803 m), weight 99.8 kg, SpO2 94 %.  PHYSICAL EXAMINATION:   Physical Exam  GENERAL:  48 y.o.-year-old patient lying in the bed with no acute distress.  EYES: Pupils equal, round, reactive to light and accommodation. No scleral icterus. Extraocular muscles intact.  HEENT: Head atraumatic, normocephalic. Oropharynx and nasopharynx clear. Missing teeth. NECK:  Supple, no jugular venous distention. No thyroid enlargement, no tenderness.  LUNGS: Normal breath sounds bilaterally, no wheezing, rales,rhonchi or crepitation. No use of accessory muscles of respiration.  CARDIOVASCULAR: S1, S2 normal. No murmurs, rubs, or gallops.  ABDOMEN: Soft, epigastric tenderness.  nondistended. Bowel sounds present. No organomegaly or mass.  EXTREMITIES: No pedal edema, cyanosis, or clubbing.  NEUROLOGIC: Cranial nerves II through XII are intact. Muscle strength 5/5 in all extremities. Sensation intact. Gait not checked.  PSYCHIATRIC: The patient is alert and oriented x 3.  SKIN: No obvious rash, lesion, or ulcer.   DATA REVIEWED:  LABORATORY PANEL:   CBC Recent Labs  Lab 01/06/19 1955  WBC 6.3  HGB 17.1*  HCT 48.8  PLT 201    ------------------------------------------------------------------------------------------------------------------  Chemistries  Recent Labs  Lab 01/06/19 1955  NA 135  K 3.4*  CL 98  CO2 24  GLUCOSE 130*  BUN 5*  CREATININE 0.78  CALCIUM 9.1  AST 142*  ALT 157*  ALKPHOS 62  BILITOT 0.8   ------------------------------------------------------------------------------------------------------------------  Cardiac Enzymes No results for input(s): TROPONINI in the last 168 hours. ------------------------------------------------------------------------------------------------------------------  RADIOLOGY:  No results found.  EKG:  EKG: normal EKG, normal sinus rhythm, unchanged from previous tracings. Vent. rate 98 BPM PR interval * ms QRS duration 126 ms QT/QTc 356/455 ms P-R-T axes 45 -34 50 IMPRESSION AND PLAN:   48 y.o. male with pertinent past medical history of EtOH abuse, alcoholic pancreatitis, tobacco abuse, and hypertension presenting to the ED with chief complaints of abdominal pain and hematemesis.  1. Alcoholic induced pancreatitis - multiple admissions for similar presentation Admit to MedSurg unit Lipase:120, also with elevated LFTs will continue to trend EtOH: 304 md/dL - EtOH Cessation counseling as appropriate - Recent CT abdomen/pelvis w/ contrast shows no findings of pancreatitis or its complications. - NPO advance from clears as tolerated - IVF maintenance while NPO, bolus PRN until euvolemic - Morphine IV/PO PRN  pain - Ondansetron or Metoclopramide PRN nausea/vomiting  3. EtOH Abuse - High risk for Withdrawal  Average Drinks Per Day >12 cans of beer per day Last Drink  01/06/2019 - Check Daily BMP+Mg - Daily Thiamine, Folate, MVI PO - SW consult for cessation resources. Patient states he is motivated to abstain  - PT/OT evaluation for mobility - CIWA +/- Standing Protocol  4. Alcoholic gastritis -Patient presenting with epigastric pain and  episode of hematemesis - H&H stable no evidence of GI bleed - Continue PPI twice daily - Avoid NSAIDs, ASA  5. Hypertension -  - Continue amlodipine  6. DVT prophylaxis. - Hold in the setting of hematemesis - Ambulation.  *Dispo. Pending above. SW consulted given EtOH abuse.  All the records are reviewed and case discussed with ED provider. Management plans discussed with the patient, family and they are in agreement.  CODE STATUS: FULL  TOTAL TIME TAKING CARE OF THIS PATIENT: 50 minutes.    on 01/06/2019 at 10:49 PM  Webb SilversmithElizabeth Nhung Danko, DNP, FNP-BC [facility] Nurse Practitioner Between 7am to 6pm - Pager [phone]- [phone]  After 6pm go to [email or web address] - password [email or web address]  [facility] [place] [facility]  Office  [phone]  CC: Primary care physician; Patient, No Pcp Per

## 2019-01-06 NOTE — ED Notes (Addendum)
MD at the bedside  

## 2019-01-07 ENCOUNTER — Other Ambulatory Visit: Payer: Self-pay

## 2019-01-07 LAB — COMPREHENSIVE METABOLIC PANEL
ALT: 128 U/L — ABNORMAL HIGH (ref 0–44)
ALT: 127 U/L — ABNORMAL HIGH (ref 0–44)
AST: 106 U/L — ABNORMAL HIGH (ref 15–41)
AST: 105 U/L — ABNORMAL HIGH (ref 15–41)
Albumin: 3.9 g/dL (ref 3.5–5.0)
Albumin: 3.7 g/dL (ref 3.5–5.0)
Alkaline Phosphatase: 55 U/L (ref 38–126)
Alkaline Phosphatase: 54 U/L (ref 38–126)
Anion gap: 15 (ref 5–15)
Anion gap: 8 (ref 5–15)
BUN: 6 mg/dL (ref 6–20)
BUN: 7 mg/dL (ref 6–20)
CO2: 20 mmol/L — ABNORMAL LOW (ref 22–32)
CO2: 25 mmol/L (ref 22–32)
Calcium: 8.3 mg/dL — ABNORMAL LOW (ref 8.9–10.3)
Calcium: 8.2 mg/dL — ABNORMAL LOW (ref 8.9–10.3)
Chloride: 105 mmol/L (ref 98–111)
Chloride: 104 mmol/L (ref 98–111)
Creatinine, Ser: 0.87 mg/dL (ref 0.61–1.24)
Creatinine, Ser: 0.74 mg/dL (ref 0.61–1.24)
GFR calc Af Amer: >60 mL/min (ref >60)
GFR calc Af Amer: >60 mL/min (ref >60)
GFR calc non Af Amer: >60 mL/min (ref >60)
GFR calc non Af Amer: >60 mL/min (ref >60)
Glucose, Bld: 117 mg/dL — ABNORMAL HIGH (ref 70–99)
Glucose, Bld: 182 mg/dL — ABNORMAL HIGH (ref 70–99)
Potassium: 3.9 mmol/L (ref 3.5–5.1)
Potassium: 3.6 mmol/L (ref 3.5–5.1)
Sodium: 140 mmol/L (ref 135–145)
Sodium: 137 mmol/L (ref 135–145)
Total Bilirubin: 0.7 mg/dL (ref 0.3–1.2)
Total Bilirubin: 0.6 mg/dL (ref 0.3–1.2)
Total Protein: 6.7 g/dL (ref 6.5–8.1)
Total Protein: 6.7 g/dL (ref 6.5–8.1)

## 2019-01-07 LAB — CBC
HCT: 48.6 % (ref 39.0–52.0)
HCT: 47.5 % (ref 39.0–52.0)
Hemoglobin: 16.7 g/dL (ref 13.0–17.0)
Hemoglobin: 16.1 g/dL (ref 13.0–17.0)
MCH: 32 pg (ref 26.0–34.0)
MCH: 31.8 pg (ref 26.0–34.0)
MCHC: 34.4 g/dL (ref 30.0–36.0)
MCHC: 33.9 g/dL (ref 30.0–36.0)
MCV: 93.1 fL (ref 80.0–100.0)
MCV: 93.7 fL (ref 80.0–100.0)
Platelets: 209 10*3/uL (ref 150–400)
Platelets: 182 10*3/uL (ref 150–400)
RBC: 5.22 MIL/uL (ref 4.22–5.81)
RBC: 5.07 MIL/uL (ref 4.22–5.81)
RDW: 13.6 % (ref 11.5–15.5)
RDW: 13.6 % (ref 11.5–15.5)
WBC: 7.1 10*3/uL (ref 4.0–10.5)
WBC: 5.4 10*3/uL (ref 4.0–10.5)
nRBC: 0 % (ref 0.0–0.2)
nRBC: 0 % (ref 0.0–0.2)

## 2019-01-07 LAB — URINE DRUG SCREEN, QUALITATIVE (ARMC ONLY)
Amphetamines, Ur Screen: NOT DETECTED
Barbiturates, Ur Screen: NOT DETECTED
Benzodiazepine, Ur Scrn: NOT DETECTED
Cannabinoid 50 Ng, Ur ~~LOC~~: NOT DETECTED
Cocaine Metabolite,Ur ~~LOC~~: NOT DETECTED
MDMA (Ecstasy)Ur Screen: NOT DETECTED
Methadone Scn, Ur: NOT DETECTED
Opiate, Ur Screen: NOT DETECTED
Phencyclidine (PCP) Ur S: NOT DETECTED
Tricyclic, Ur Screen: NOT DETECTED

## 2019-01-07 LAB — PHOSPHORUS: Phosphorus: 3.5 mg/dL (ref 2.5–4.6)

## 2019-01-07 LAB — MAGNESIUM: Magnesium: 1.8 mg/dL (ref 1.7–2.4)

## 2019-01-07 LAB — HIV ANTIBODY (ROUTINE TESTING W REFLEX): HIV Screen 4th Generation wRfx: NONREACTIVE

## 2019-01-07 LAB — SARS CORONAVIRUS 2 (TAT 6-24 HRS): SARS Coronavirus 2: NEGATIVE

## 2019-01-07 MED ORDER — HYDROMORPHONE HCL 1 MG/ML IJ SOLN
INTRAMUSCULAR | Status: AC
  Administered 2019-01-07: 1 mg
  Filled 2019-01-07: qty 1

## 2019-01-07 MED ORDER — LORAZEPAM 1 MG PO TABS
1.0000 mg | ORAL_TABLET | ORAL | Status: AC | PRN
  Administered 2019-01-07 – 2019-01-09 (×6): 2 mg via ORAL
  Filled 2019-01-07 (×6): qty 2

## 2019-01-07 MED ORDER — INFLUENZA VAC SPLIT QUAD 0.5 ML IM SUSY
0.5000 mL | PREFILLED_SYRINGE | INTRAMUSCULAR | Status: AC
  Administered 2019-01-09: 0.5 mL via INTRAMUSCULAR
  Filled 2019-01-07: qty 0.5

## 2019-01-07 MED ORDER — ADULT MULTIVITAMIN W/MINERALS CH: 1.0000 | ORAL_TABLET | Freq: Every day | ORAL | Status: DC

## 2019-01-07 MED ORDER — ONDANSETRON HCL 4 MG/2ML IJ SOLN
4.0000 mg | Freq: Four times a day (QID) | INTRAMUSCULAR | Status: DC | PRN
  Administered 2019-01-07 – 2019-01-09 (×2): 4 mg via INTRAVENOUS
  Filled 2019-01-07: qty 2

## 2019-01-07 MED ORDER — FOLIC ACID 1 MG PO TABS
1.0000 mg | ORAL_TABLET | Freq: Every day | ORAL | Status: DC
  Administered 2019-01-08 – 2019-01-10 (×3): 1 mg via ORAL
  Filled 2019-01-07 (×3): qty 1

## 2019-01-07 MED ORDER — HYDROMORPHONE HCL 1 MG/ML IJ SOLN
1.0000 mg | INTRAMUSCULAR | Status: DC | PRN
  Administered 2019-01-07 – 2019-01-09 (×15): 1 mg via INTRAVENOUS
  Filled 2019-01-07 (×16): qty 1

## 2019-01-07 MED ORDER — LORAZEPAM 2 MG/ML IJ SOLN
1.0000 mg | INTRAMUSCULAR | Status: AC | PRN
  Administered 2019-01-09: 1 mg via INTRAVENOUS
  Administered 2019-01-09: 10:00:00 2 mg via INTRAVENOUS
  Administered 2019-01-10: 02:00:00 4 mg via INTRAVENOUS
  Filled 2019-01-07: qty 2

## 2019-01-07 MED ORDER — VITAMIN B-1 100 MG PO TABS
100.0000 mg | ORAL_TABLET | Freq: Every day | ORAL | Status: DC
  Administered 2019-01-08 – 2019-01-10 (×3): 100 mg via ORAL
  Filled 2019-01-07 (×3): qty 1

## 2019-01-07 MED ORDER — THIAMINE HCL 100 MG/ML IJ SOLN
100.0000 mg | Freq: Every day | INTRAMUSCULAR | Status: DC
  Administered 2019-01-09: 100 mg via INTRAVENOUS
  Filled 2019-01-07: qty 2

## 2019-01-07 MED ORDER — NICOTINE 21 MG/24HR TD PT24
21.0000 mg | MEDICATED_PATCH | Freq: Every day | TRANSDERMAL | Status: DC
  Administered 2019-01-07 – 2019-01-09 (×3): 21 mg via TRANSDERMAL
  Filled 2019-01-07 (×4): qty 1

## 2019-01-07 MED ORDER — AMLODIPINE BESYLATE 10 MG PO TABS
10.0000 mg | ORAL_TABLET | Freq: Every day | ORAL | Status: DC
  Administered 2019-01-07 – 2019-01-10 (×4): 10 mg via ORAL
  Filled 2019-01-07 (×4): qty 1

## 2019-01-07 MED ORDER — HYDROMORPHONE HCL 1 MG/ML IJ SOLN
INTRAMUSCULAR | Status: AC
  Administered 2019-01-07: 02:00:00 1 mg
  Filled 2019-01-07: qty 1

## 2019-01-07 MED ORDER — LORAZEPAM 2 MG/ML IJ SOLN
0.0000 mg | Freq: Four times a day (QID) | INTRAMUSCULAR | Status: AC
  Administered 2019-01-07: 2 mg via INTRAVENOUS
  Administered 2019-01-07 (×2): 1 mg via INTRAVENOUS
  Administered 2019-01-08: 2 mg via INTRAVENOUS
  Administered 2019-01-08 – 2019-01-09 (×3): 1 mg via INTRAVENOUS
  Administered 2019-01-09: 2 mg via INTRAVENOUS
  Filled 2019-01-07 (×7): qty 1

## 2019-01-07 MED ORDER — LORAZEPAM 2 MG/ML IJ SOLN
0.0000 mg | Freq: Two times a day (BID) | INTRAMUSCULAR | Status: DC
  Administered 2019-01-09: 2 mg via INTRAVENOUS
  Administered 2019-01-10: 11:00:00 1 mg via INTRAVENOUS
  Filled 2019-01-07 (×3): qty 1

## 2019-01-07 NOTE — Progress Notes (Addendum)
[place] [facility] [place] NAME: Kristopher [name]    MR#:  [id number]  DATE OF BIRTH:  1970/09/27  SUBJECTIVE:  CHIEF COMPLAINT:   Chief Complaint  Patient presents with  . Abdominal Pain  continues to have abd pain, BP up, scoring high on CIWA per nursing REVIEW OF SYSTEMS:  Review of Systems  Constitutional: Negative for diaphoresis, fever, malaise/fatigue and weight loss.  HENT: Negative for ear discharge, ear pain, hearing loss, nosebleeds, sore throat and tinnitus.   Eyes: Negative for blurred vision and pain.  Respiratory: Negative for cough, hemoptysis, shortness of breath and wheezing.   Cardiovascular: Negative for chest pain, palpitations, orthopnea and leg swelling.  Gastrointestinal: Positive for abdominal pain. Negative for blood in stool, constipation, diarrhea, heartburn, nausea and vomiting.  Genitourinary: Negative for dysuria, frequency and urgency.  Musculoskeletal: Negative for back pain and myalgias.  Skin: Negative for itching and rash.  Neurological: Negative for dizziness, tingling, tremors, focal weakness, seizures, weakness and headaches.  Psychiatric/Behavioral: Negative for depression. The patient is not nervous/anxious.     DRUG ALLERGIES:  No Known Allergies VITALS:  Blood pressure (!) 156/100, pulse 79, temperature 97.9 F (36.6 C), temperature source Oral, resp. rate (!) 24, height 5\' 11"  (1.803 m), weight 108.6 kg, SpO2 97 %. PHYSICAL EXAMINATION:  Physical Exam HENT:     Head: Normocephalic and atraumatic.  Eyes:     Conjunctiva/sclera: Conjunctivae normal.     Pupils: Pupils are equal, round, and reactive to light.  Neck:     Musculoskeletal: Normal range of motion and neck supple.     Thyroid: No thyromegaly.     Trachea: No tracheal deviation.  Cardiovascular:     Rate and Rhythm: Normal rate and regular rhythm.     Heart sounds: Normal heart sounds.  Pulmonary:     Effort: Pulmonary effort is  normal. No respiratory distress.     Breath sounds: Normal breath sounds. No wheezing.  Chest:     Chest wall: No tenderness.  Abdominal:     General: Bowel sounds are normal. There is no distension.     Palpations: Abdomen is soft.     Tenderness: There is no abdominal tenderness.  Musculoskeletal: Normal range of motion.  Skin:    General: Skin is warm and dry.     Findings: No rash.  Neurological:     Mental Status: He is alert and oriented to person, place, and time.     Cranial Nerves: No cranial nerve deficit.    LABORATORY PANEL:  Male CBC Recent Labs  Lab 01/07/19 0952  WBC 5.4  HGB 16.1  HCT 47.5  PLT 182   ------------------------------------------------------------------------------------------------------------------ Chemistries  Recent Labs  Lab 01/07/19 0952  NA 137  K 3.6  CL 104  CO2 25  GLUCOSE 182*  BUN 7  CREATININE 0.74  CALCIUM 8.2*  MG 1.8  AST 105*  ALT 127*  ALKPHOS 54  BILITOT 0.6   RADIOLOGY:  No results found. ASSESSMENT AND PLAN:  48 y.o. male with pertinent past medical history of EtOH abuse, alcoholic pancreatitis, tobacco abuse, and hypertension presenting to the ED with chief complaints of abdominal pain and hematemesis.  1. Alcoholic induced pancreatitis - multiple admissions for similar presentation Lipase:120, also with elevated LFTs will continue to trend EtOH: 304 md/dL - EtOH Cessation counseling as appropriate - Recent CT abdomen/pelvis w/ contrast shows no findings of pancreatitis or its complications. - CLD, advance diet as tolerated -  IVF maintenance while NPO, bolus PRN until euvolemic - Morphine IV/PO PRN pain - Ondansetron or Metoclopramide PRN nausea/vomiting  3. EtOH Abuse - High risk for Withdrawal  Average Drinks Per Day >12 cans of beer per day Last Drink  01/06/2019 - Daily Thiamine, Folate, MVI PO - SW consult for cessation resources. Patient states he is motivated to abstain  - PT/OT evaluation  for mobility - CIWA +/- Standing Protocol  4. Alcoholic gastritis -Patient presenting with epigastric pain and episode of hematemesis - H&H stable no evidence of GI bleed - Continue PPI twice daily - Avoid NSAIDs, ASA  5. Hypertension -  - Continue amlodipine  6. DVT prophylaxis. - Hold in the setting of hematemesis     All the records are reviewed and case discussed with Care Management/Social Worker. Management plans discussed with the patient, nursing and they are in agreement.  CODE STATUS: Full Code  TOTAL TIME TAKING CARE OF THIS PATIENT: 35 minutes.   More than 50% of the time was spent in counseling/coordination of care: YES  POSSIBLE D/C IN 1-2 DAYS, DEPENDING ON CLINICAL CONDITION.   Delfino Lovett M.D on 01/07/2019 at 4:00 PM  Between 7am to 6pm - Pager - [phone]  After 6pm go to [email or web address] - Social research officer, government  [facility]  Office  [phone]  CC: Primary care physician; Patient, No Pcp Per  Note: This dictation was prepared with Dragon dictation along with smaller phrase technology. Any transcriptional errors that result from this process are unintentional.

## 2019-01-08 LAB — CBC
HCT: 46.2 % (ref 39.0–52.0)
Hemoglobin: 15.9 g/dL (ref 13.0–17.0)
MCH: 32.3 pg (ref 26.0–34.0)
MCHC: 34.4 g/dL (ref 30.0–36.0)
MCV: 93.7 fL (ref 80.0–100.0)
Platelets: 171 10*3/uL (ref 150–400)
RBC: 4.93 MIL/uL (ref 4.22–5.81)
RDW: 13.1 % (ref 11.5–15.5)
WBC: 6.1 10*3/uL (ref 4.0–10.5)
nRBC: 0 % (ref 0.0–0.2)

## 2019-01-08 LAB — COMPREHENSIVE METABOLIC PANEL
ALT: 120 U/L — ABNORMAL HIGH (ref 0–44)
AST: 86 U/L — ABNORMAL HIGH (ref 15–41)
Albumin: 4 g/dL (ref 3.5–5.0)
Alkaline Phosphatase: 54 U/L (ref 38–126)
Anion gap: 9 (ref 5–15)
BUN: 8 mg/dL (ref 6–20)
CO2: 27 mmol/L (ref 22–32)
Calcium: 8.8 mg/dL — ABNORMAL LOW (ref 8.9–10.3)
Chloride: 101 mmol/L (ref 98–111)
Creatinine, Ser: 0.88 mg/dL (ref 0.61–1.24)
GFR calc Af Amer: >60 mL/min (ref >60)
GFR calc non Af Amer: >60 mL/min (ref >60)
Glucose, Bld: 173 mg/dL — ABNORMAL HIGH (ref 70–99)
Potassium: 3.8 mmol/L (ref 3.5–5.1)
Sodium: 137 mmol/L (ref 135–145)
Total Bilirubin: 0.9 mg/dL (ref 0.3–1.2)
Total Protein: 6.9 g/dL (ref 6.5–8.1)

## 2019-01-08 LAB — LIPASE, BLOOD: Lipase: 33 U/L (ref 11–51)

## 2019-01-08 LAB — AMYLASE: Amylase: 41 U/L (ref 28–100)

## 2019-01-08 MED ORDER — HYDRALAZINE HCL 25 MG PO TABS
25.0000 mg | ORAL_TABLET | Freq: Three times a day (TID) | ORAL | Status: DC
  Administered 2019-01-08 – 2019-01-09 (×5): 25 mg via ORAL
  Filled 2019-01-08 (×5): qty 1

## 2019-01-08 NOTE — TOC Initial Note (Signed)
Transition of Care [facility]) - Initial/Assessment Note    Patient Details  Name: Kristopher [name] MRN: [id number] Date of Birth: 1970/10/18  [facility] CM/SW Contact:    Beverly Sessions, RN Phone Number: 01/08/2019, 10:29 AM  Clinical Narrative:                 Patient admitted for acute alcoholic pancreatitis  Patient states that he resides with his fiance, her brother, and her grandfather.  States that they have been staying in a hotel for the last year Patient states that it is his goal for them to find a home, but for now the plan remains to return to the hotel  Patient states that he is employed and works 3rd shift.  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  Patient unsure of a pharmacy he uses.  [facility] listed in Epic  Patient with hx of alcohol abuse.  Patient states that he drinks heavy daily.  Unable to give me an estimated number of drinks.  Patient states that in September of last year he went to an inpatient residential program. Patient states "it helped for a while, then I started drinking again"  Patient states that he is interested in outpatient treatment programs.  RNCM provided patient for Substance Abuse both outpatient and residential.   Patient states he does not have a PCP.  Patient agreeable for new patient appointment at one of the local sliding scale clinics.  Appointment made for [facility] on 10/23.  Appointment information put on AVS   Expected Discharge Plan: Home/Self Care Barriers to Discharge: Continued Medical Work up   Patient Goals and CMS Choice        Expected Discharge Plan and Services Expected Discharge Plan: Home/Self Care   Discharge Planning Services: CM Consult    Living arrangements for the past 2 months: Hotel/Motel                                      Prior Living Arrangements/Services Living arrangements for the past 2 months: Hotel/Motel Lives with:: Significant Other, Roommate Patient language and need for interpreter reviewed:: Yes Do you feel safe going back to the place where you live?: Yes      Need for Family Participation in Patient Care: No (Comment) Care giver support system in place?: Yes (comment)   Criminal Activity/Legal Involvement Pertinent to Current Situation/Hospitalization: No - Comment as needed  Activities of Daily Living Home Assistive Devices/Equipment: None ADL Screening (condition at time of admission) Patient's cognitive ability adequate to safely complete daily activities?: Yes Is the patient deaf or have difficulty hearing?: No Does the patient have difficulty seeing, even when wearing glasses/contacts?: No Does the patient have difficulty concentrating, remembering, or making decisions?: No Patient able to express need for assistance with ADLs?: Yes Does the patient have difficulty dressing or bathing?: No Independently performs ADLs?: Yes (appropriate for developmental age) Does the patient have difficulty walking or climbing stairs?: No Weakness of Legs: None Weakness of Arms/Hands: None  Permission Sought/Granted                  Emotional  Assessment Appearance:: Appears stated age Attitude/Demeanor/Rapport: Inconsistent   Orientation: : Oriented to Self, Oriented to Place, Oriented to  Time, Oriented to Situation Alcohol / Substance Use: Alcohol Use    Admission diagnosis:  Alcoholic hepatitis without ascites [K70.10] Alcohol dependence with intoxication with complication (HCC) [F10.229] Alcohol-induced acute pancreatitis, unspecified complication status [K85.20] Patient Active Problem List   Diagnosis Date Noted  . Acute alcoholic pancreatitis 01/06/2019   PCP:  Patient, No  Pcp Per Pharmacy:   [id number] [place], [place] - [address] AT [facility] OF [address] [place] [address] Phone: [phone] Fax: [phone]     Social Determinants of Health (SDOH) Interventions    Readmission Risk Interventions Readmission Risk Prevention Plan 01/08/2019  Transportation Screening Complete  Palliative Care Screening Not Applicable  Medication Review (RN Care Manager) Complete  Some recent data might be hidden

## 2019-01-08 NOTE — Progress Notes (Signed)
[facility] - [place] [facility]   PATIENT NAME: Kristopher [name]    MR#:  [id number]  DATE OF BIRTH:  [birth year]  SUBJECTIVE:  CHIEF COMPLAINT:   Chief Complaint  Patient presents with  . Abdominal Pain  continues to have abd pain, BP up, says he will need ongoing pain medicine and is okay stopping for as has been hurting badly REVIEW OF SYSTEMS:  Review of Systems  Constitutional: Negative for diaphoresis, fever, malaise/fatigue and weight loss.  HENT: Negative for ear discharge, ear pain, hearing loss, nosebleeds, sore throat and tinnitus.   Eyes: Negative for blurred vision and pain.  Respiratory: Negative for cough, hemoptysis, shortness of breath and wheezing.   Cardiovascular: Negative for chest pain, palpitations, orthopnea and leg swelling.  Gastrointestinal: Positive for abdominal pain. Negative for blood in stool, constipation, diarrhea, heartburn, nausea and vomiting.  Genitourinary: Negative for dysuria, frequency and urgency.  Musculoskeletal: Negative for back pain and myalgias.  Skin: Negative for itching and rash.  Neurological: Negative for dizziness, tingling, tremors, focal weakness, seizures, weakness and headaches.  Psychiatric/Behavioral: Negative for depression. The patient is not nervous/anxious.     DRUG ALLERGIES:  No Known Allergies VITALS:  Blood pressure (!) 170/103, pulse 78, temperature (!) 97.5 F (36.4 C), temperature source Oral, resp. rate 20, height 5\' 11"  (1.803 m), weight 108.6 kg, SpO2 97 %. PHYSICAL EXAMINATION:  Physical Exam HENT:     Head: Normocephalic and atraumatic.  Eyes:     Conjunctiva/sclera: Conjunctivae normal.     Pupils: Pupils are equal, round, and reactive to light.  Neck:     Musculoskeletal: Normal range of motion and neck supple.     Thyroid: No thyromegaly.     Trachea: No tracheal deviation.  Cardiovascular:     Rate and Rhythm: Normal rate and regular rhythm.     Heart sounds: Normal  heart sounds.  Pulmonary:     Effort: Pulmonary effort is normal. No respiratory distress.     Breath sounds: Normal breath sounds. No wheezing.  Chest:     Chest wall: No tenderness.  Abdominal:     General: Bowel sounds are normal. There is no distension.     Palpations: Abdomen is soft.     Tenderness: There is no abdominal tenderness.  Musculoskeletal: Normal range of motion.  Skin:    General: Skin is warm and dry.     Findings: No rash.  Neurological:     Mental Status: He is alert and oriented to person, place, and time.     Cranial Nerves: No cranial nerve deficit.    LABORATORY PANEL:  Male CBC Recent Labs  Lab 01/08/19 0536  WBC 6.1  HGB 15.9  HCT 46.2  PLT 171   ------------------------------------------------------------------------------------------------------------------ Chemistries  Recent Labs  Lab 01/07/19 0952 01/08/19 0536  NA 137 137  K 3.6 3.8  CL 104 101  CO2 25 27  GLUCOSE 182* 173*  BUN 7 8  CREATININE 0.74 0.88  CALCIUM 8.2* 8.8*  MG 1.8  --   AST 105* 86*  ALT 127* 120*  ALKPHOS 54 54  BILITOT 0.6 0.9   RADIOLOGY:  No results found. ASSESSMENT AND PLAN:  48 y.o. male with pertinent past medical history of EtOH abuse, alcoholic pancreatitis, tobacco abuse, and hypertension presenting to the ED with chief complaints of abdominal pain and hematemesis.  1. Alcoholic induced pancreatitis - multiple admissions for similar presentation - EtOH Cessation counseling as appropriate - Recent CT abdomen/pelvis  w/ contrast shows no findings of pancreatitis or its complications. -His abdominal pain has gotten worse I will make him n.p.o. again although his LFTs and lipase trending down - IVF maintenance while NPO, bolus PRN until euvolemic -Dilaudid PRN pain - Ondansetron or Metoclopramide PRN nausea/vomiting  3. EtOH Abuse - High risk for Withdrawal  Average Drinks Per Day >12 cans of beer per day Last Drink  01/06/2019 - Daily  Thiamine, Folate, MVI PO - SW consult for cessation resources. Patient states he is motivated to abstain  - PT/OT evaluation for mobility - CIWA +/- Standing Protocol  4. Alcoholic gastritis -Patient presenting with epigastric pain and episode of hematemesis - H&H stable no evidence of GI bleed - Continue PPI twice daily - Avoid NSAIDs, ASA  5. Hypertension -  - Continue amlodipine -Add hydralazine for better blood pressure control  6. DVT prophylaxis. - Hold in the setting of hematemesis     All the records are reviewed and case discussed with Care Management/Social Worker. Management plans discussed with the patient, nursing and they are in agreement.  CODE STATUS: Full Code  TOTAL TIME TAKING CARE OF THIS PATIENT: 35 minutes.   More than 50% of the time was spent in counseling/coordination of care: YES  POSSIBLE D/C IN 1-2 DAYS, DEPENDING ON CLINICAL CONDITION.   Max Sane M.D on 01/08/2019 at 11:49 AM  Between 7am to 6pm - Pager - [phone]  After 6pm go to [email or web address] - Proofreader  [facility] [facility]  Office  [phone]  CC: Primary care physician; Patient, No Pcp Per  Note: This dictation was prepared with Dragon dictation along with smaller phrase technology. Any transcriptional errors that result from this process are unintentional.

## 2019-01-09 LAB — COMPREHENSIVE METABOLIC PANEL
ALT: 108 U/L — ABNORMAL HIGH (ref 0–44)
AST: 70 U/L — ABNORMAL HIGH (ref 15–41)
Albumin: 3.9 g/dL (ref 3.5–5.0)
Alkaline Phosphatase: 51 U/L (ref 38–126)
Anion gap: 7 (ref 5–15)
BUN: 6 mg/dL (ref 6–20)
CO2: 29 mmol/L (ref 22–32)
Calcium: 8.8 mg/dL — ABNORMAL LOW (ref 8.9–10.3)
Chloride: 103 mmol/L (ref 98–111)
Creatinine, Ser: 0.69 mg/dL (ref 0.61–1.24)
GFR calc Af Amer: >60 mL/min (ref >60)
GFR calc non Af Amer: >60 mL/min (ref >60)
Glucose, Bld: 116 mg/dL — ABNORMAL HIGH (ref 70–99)
Potassium: 3.6 mmol/L (ref 3.5–5.1)
Sodium: 139 mmol/L (ref 135–145)
Total Bilirubin: 1 mg/dL (ref 0.3–1.2)
Total Protein: 7 g/dL (ref 6.5–8.1)

## 2019-01-09 LAB — AMYLASE: Amylase: 37 U/L (ref 28–100)

## 2019-01-09 LAB — LIPASE, BLOOD: Lipase: 31 U/L (ref 11–51)

## 2019-01-09 LAB — CBC
HCT: 46.2 % (ref 39.0–52.0)
Hemoglobin: 15.8 g/dL (ref 13.0–17.0)
MCH: 32.1 pg (ref 26.0–34.0)
MCHC: 34.2 g/dL (ref 30.0–36.0)
MCV: 93.9 fL (ref 80.0–100.0)
Platelets: 173 10*3/uL (ref 150–400)
RBC: 4.92 MIL/uL (ref 4.22–5.81)
RDW: 13.2 % (ref 11.5–15.5)
WBC: 5.5 10*3/uL (ref 4.0–10.5)
nRBC: 0 % (ref 0.0–0.2)

## 2019-01-09 MED ORDER — LABETALOL HCL 5 MG/ML IV SOLN
5.0000 mg | INTRAVENOUS | Status: DC | PRN
  Administered 2019-01-09 (×5): 10 mg via INTRAVENOUS
  Filled 2019-01-09 (×5): qty 4

## 2019-01-09 MED ORDER — ISOSORBIDE MONONITRATE ER 30 MG PO TB24
15.0000 mg | ORAL_TABLET | Freq: Once | ORAL | Status: AC
  Administered 2019-01-09: 15 mg via ORAL
  Filled 2019-01-09: qty 1

## 2019-01-09 NOTE — Progress Notes (Signed)
[place] [facility] [place] NAME: Kristopher [name]    MR#:  [id number]  DATE OF BIRTH:  [birth year]  SUBJECTIVE:  CHIEF COMPLAINT:   Chief Complaint  Patient presents with  . Abdominal Pain  requesting more pain medicine, I have made him n.p.o. but per nursing he was found eating food from outside REVIEW OF SYSTEMS:  Review of Systems  Constitutional: Negative for diaphoresis, fever, malaise/fatigue and weight loss.  HENT: Negative for ear discharge, ear pain, hearing loss, nosebleeds, sore throat and tinnitus.   Eyes: Negative for blurred vision and pain.  Respiratory: Negative for cough, hemoptysis, shortness of breath and wheezing.   Cardiovascular: Negative for chest pain, palpitations, orthopnea and leg swelling.  Gastrointestinal: Positive for abdominal pain. Negative for blood in stool, constipation, diarrhea, heartburn, nausea and vomiting.  Genitourinary: Negative for dysuria, frequency and urgency.  Musculoskeletal: Negative for back pain and myalgias.  Skin: Negative for itching and rash.  Neurological: Negative for dizziness, tingling, tremors, focal weakness, seizures, weakness and headaches.  Psychiatric/Behavioral: Negative for depression. The patient is not nervous/anxious.     DRUG ALLERGIES:  No Known Allergies VITALS:  Blood pressure 140/87, pulse 80, temperature 97.6 F (36.4 C), temperature source Oral, resp. rate 20, height 5\' 11"  (1.803 m), weight 108.6 kg, SpO2 98 %. PHYSICAL EXAMINATION:  Physical Exam HENT:     Head: Normocephalic and atraumatic.  Eyes:     Conjunctiva/sclera: Conjunctivae normal.     Pupils: Pupils are equal, round, and reactive to light.  Neck:     Musculoskeletal: Normal range of motion and neck supple.     Thyroid: No thyromegaly.     Trachea: No tracheal deviation.  Cardiovascular:     Rate and Rhythm: Normal rate and regular rhythm.     Heart sounds: Normal heart sounds.  Pulmonary:     Effort: Pulmonary effort is normal. No respiratory distress.     Breath sounds: Normal breath sounds. No wheezing.  Chest:     Chest wall: No tenderness.  Abdominal:     General: Bowel sounds are normal. There is no distension.     Palpations: Abdomen is soft.     Tenderness: There is no abdominal tenderness.  Musculoskeletal: Normal range of motion.  Skin:    General: Skin is warm and dry.     Findings: No rash.  Neurological:     Mental Status: He is alert and oriented to person, place, and time.     Cranial Nerves: No cranial nerve deficit.    LABORATORY PANEL:  Male CBC Recent Labs  Lab 01/09/19 0507  WBC 5.5  HGB 15.8  HCT 46.2  PLT 173   ------------------------------------------------------------------------------------------------------------------ Chemistries  Recent Labs  Lab 01/07/19 0952  01/09/19 0507  NA 137   < > 139  K 3.6   < > 3.6  CL 104   < > 103  CO2 25   < > 29  GLUCOSE 182*   < > 116*  BUN 7   < > 6  CREATININE 0.74   < > 0.69  CALCIUM 8.2*   < > 8.8*  MG 1.8  --   --   AST 105*   < > 70*  ALT 127*   < > 108*  ALKPHOS 54   < > 51  BILITOT 0.6   < > 1.0   < > = values in this interval not displayed.   RADIOLOGY:  No results  found. ASSESSMENT AND PLAN:  48 y.o. male with pertinent past medical history of EtOH abuse, alcoholic pancreatitis, tobacco abuse, and hypertension presenting to the ED with chief complaints of abdominal pain and hematemesis.  1. Alcoholic induced pancreatitis - multiple admissions for similar presentation - EtOH Cessation counseling  - Recent CT abdomen/pelvis w/ contrast shows no findings of pancreatitis or its complications. -His abdominal pain has gotten worse - so, I will make him n.p.o. again although his LFTs and lipase trending down.  His behavior is worrisome for drug-seeking - IVF maintenance while NPO, bolus PRN until euvolemic -Dilaudid PRN pain - Ondansetron or Metoclopramide PRN nausea/vomiting   3. EtOH Abuse - High risk for Withdrawal  Average Drinks Per Day >12 cans of beer per day Last Drink  01/06/2019 - Daily Thiamine, Folate, MVI PO - SW consult for cessation resources. Patient states he is motivated to abstain  - PT/OT evaluation for mobility - CIWA +/- Standing Protocol  4. Alcoholic gastritis -Patient presenting with epigastric pain and episode of hematemesis - H&H stable no evidence of GI bleed - Continue PPI twice daily - Avoid NSAIDs, ASA  5. Hypertension -  - Continue amlodipine -Add hydralazine for better blood pressure control  6. DVT prophylaxis. - Hold in the setting of hematemesis     All the records are reviewed and case discussed with Care Management/Social Worker. Management plans discussed with the patient, nursing and they are in agreement.  CODE STATUS: Full Code  TOTAL TIME TAKING CARE OF THIS PATIENT: 35 minutes.   More than 50% of the time was spent in counseling/coordination of care: YES  POSSIBLE D/C IN 1-2 DAYS, DEPENDING ON CLINICAL CONDITION.   Delfino Lovett M.D on 01/09/2019 at 2:22 PM  Between 7am to 6pm - Pager - [phone]  After 6pm go to [email or web address] - Social research officer, government  [facility]  Office  [phone]  CC: Primary care physician; Patient, No Pcp Per  Note: This dictation was prepared with Dragon dictation along with smaller phrase technology. Any transcriptional errors that result from this process are unintentional.

## 2019-01-09 NOTE — Progress Notes (Addendum)
MD notified: the patient was eating whoppers (chocolate) in the room. The patient has a large bag and seem to have eating half of the bag already.

## 2019-01-09 NOTE — Progress Notes (Signed)
Pt BP remained elevated. Primary nurse paged and spoke to DR. Willis. MD to place orders. Primary nurse to continue to monitor.

## 2019-01-10 LAB — CBC
HCT: 46.7 % (ref 39.0–52.0)
Hemoglobin: 16 g/dL (ref 13.0–17.0)
MCH: 32 pg (ref 26.0–34.0)
MCHC: 34.3 g/dL (ref 30.0–36.0)
MCV: 93.4 fL (ref 80.0–100.0)
Platelets: 174 10*3/uL (ref 150–400)
RBC: 5 MIL/uL (ref 4.22–5.81)
RDW: 13.2 % (ref 11.5–15.5)
WBC: 5.3 10*3/uL (ref 4.0–10.5)
nRBC: 0 % (ref 0.0–0.2)

## 2019-01-10 LAB — COMPREHENSIVE METABOLIC PANEL
ALT: 100 U/L — ABNORMAL HIGH (ref 0–44)
AST: 68 U/L — ABNORMAL HIGH (ref 15–41)
Albumin: 4.1 g/dL (ref 3.5–5.0)
Alkaline Phosphatase: 49 U/L (ref 38–126)
Anion gap: 11 (ref 5–15)
BUN: 6 mg/dL (ref 6–20)
CO2: 25 mmol/L (ref 22–32)
Calcium: 9.1 mg/dL (ref 8.9–10.3)
Chloride: 101 mmol/L (ref 98–111)
Creatinine, Ser: 0.79 mg/dL (ref 0.61–1.24)
GFR calc Af Amer: >60 mL/min (ref >60)
GFR calc non Af Amer: >60 mL/min (ref >60)
Glucose, Bld: 113 mg/dL — ABNORMAL HIGH (ref 70–99)
Potassium: 3.9 mmol/L (ref 3.5–5.1)
Sodium: 137 mmol/L (ref 135–145)
Total Bilirubin: 1.5 mg/dL — ABNORMAL HIGH (ref 0.3–1.2)
Total Protein: 7.2 g/dL (ref 6.5–8.1)

## 2019-01-10 LAB — AMYLASE: Amylase: 33 U/L (ref 28–100)

## 2019-01-10 LAB — LIPASE, BLOOD: Lipase: 22 U/L (ref 11–51)

## 2019-01-10 MED ORDER — VERAPAMIL HCL 2.5 MG/ML IV SOLN
5.0000 mg | Freq: Once | INTRAVENOUS | Status: AC
  Administered 2019-01-10: 5 mg via INTRAVENOUS
  Filled 2019-01-10: qty 2

## 2019-01-10 NOTE — Discharge Summary (Signed)
[facility] - [place] [facility]  [name], [place]47 y.o., DOB [birth year], MRN [id number]. Admission date: 01/06/2019 Discharge Date 01/10/2019 Primary MD Patient, No Pcp Per Admitting Physician Kristopher NormanElizabeth Achieng Ouma, NP  Admission Diagnosis  Alcoholic hepatitis without ascites [K70.10] Alcohol dependence with intoxication with complication (HCC) [F10.229] Alcohol-induced acute pancreatitis, unspecified complication status [K85.20]  Discharge Diagnosis   Active Problems:   Acute alcoholic pancreatitis Alcohol abuse Alcoholic gastritis Essential hypertension     Hospital Course 47 y.o.malewith pertinent past medical history ofEtOH abuse, alcoholic pancreatitis, tobacco abuse, and hypertension presenting to the ED with chief complaints of abdominal pain and hematemesis.  Patient was noted to have acute alcoholic pancreatitis.  He was kept n.p.o. and pain control.  Patient is doing better he wants to go home.            Consults  None  Significant Tests:  See full reports for all details     Dg Ribs Unilateral W/chest Right  Result Date: 12/19/2018 CLINICAL DATA:  Rib fractures 2 weeks ago. Increasing pain. Shortness of breath EXAM: RIGHT RIBS AND CHEST - 3+ VIEW COMPARISON:  11/30/2018 FINDINGS: Healed right rib fractures. No hemothorax or pneumothorax. Normal heart size and mediastinal contours. Nodular density at the right base is a nipple shadow. No nodule in this area on recent imaging. IMPRESSION: Multiple remote right rib fractures. No superimposed acute fracture or visible intrathoracic injury. Electronically Signed   By: Marnee SpringJonathon  Watts M.D.   On: 12/19/2018 04:13       Today   Subjective:   Kristopher BoresMichael [name] patient denying any abdominal pain  Objective:   Blood pressure (!) 141/90, pulse 97, temperature 99 F (37.2 C), temperature source Oral, resp. rate 18, height 5\' 11"  (1.803 m), weight 108.6 kg, SpO2 98 %.  .  Intake/Output  Summary (Last 24 hours) at 01/10/2019 1347 Last data filed at 01/10/2019 0647 Gross per 24 hour  Intake 5535.41 ml  Output -  Net 5535.41 ml    Exam VITAL SIGNS: Blood pressure (!) 141/90, pulse 97, temperature 99 F (37.2 C), temperature source Oral, resp. rate 18, height 5\' 11"  (1.803 m), weight 108.6 kg, SpO2 98 %.  GENERAL:  48 y.o.-year-old patient lying in the bed with no acute distress.  EYES: Pupils equal, round, reactive to light and accommodation. No scleral icterus. Extraocular muscles intact.  HEENT: Head atraumatic, normocephalic. Oropharynx and nasopharynx clear.  NECK:  Supple, no jugular venous distention. No thyroid enlargement, no tenderness.  LUNGS: Normal breath sounds bilaterally, no wheezing, rales,rhonchi or crepitation. No use of accessory muscles of respiration.  CARDIOVASCULAR: S1, S2 normal. No murmurs, rubs, or gallops.  ABDOMEN: Soft, nontender, nondistended. Bowel sounds present. No organomegaly or mass.  EXTREMITIES: No pedal edema, cyanosis, or clubbing.  NEUROLOGIC: Cranial nerves II through XII are intact. Muscle strength 5/5 in all extremities. Sensation intact. Gait not checked.  PSYCHIATRIC: The patient is alert and oriented x 3.  SKIN: No obvious rash, lesion, or ulcer.   Data Review     CBC w Diff:  Lab Results  Component Value Date   WBC 5.3 01/10/2019   HGB 16.0 01/10/2019   HCT 46.7 01/10/2019   PLT 174 01/10/2019   LYMPHOPCT 49 10/29/2018   MONOPCT 6 10/29/2018   EOSPCT 2 10/29/2018   BASOPCT 1 10/29/2018   CMP:  Lab Results  Component Value Date   NA 137 01/10/2019   K 3.9 01/10/2019   CL 101 01/10/2019  CO2 25 01/10/2019   BUN 6 01/10/2019   CREATININE 0.79 01/10/2019   PROT 7.2 01/10/2019   ALBUMIN 4.1 01/10/2019   BILITOT 1.5 (H) 01/10/2019   ALKPHOS 49 01/10/2019   AST 68 (H) 01/10/2019   ALT 100 (H) 01/10/2019  .  Micro Results Recent Results (from the past 240 hour(s))  SARS CORONAVIRUS 2 (TAT 6-24 HRS)  Nasopharyngeal Nasopharyngeal Swab     Status: None   Collection Time: 01/06/19 10:51 PM   Specimen: Nasopharyngeal Swab  Result Value Ref Range Status   SARS Coronavirus 2 NEGATIVE NEGATIVE Final    Comment: (NOTE) SARS-CoV-2 target nucleic acids are NOT DETECTED. The SARS-CoV-2 RNA is generally detectable in upper and lower respiratory specimens during the acute phase of infection. Negative results do not preclude SARS-CoV-2 infection, do not rule out co-infections with other pathogens, and should not be used as the sole basis for treatment or other patient management decisions. Negative results must be combined with clinical observations, patient history, and epidemiological information. The expected result is Negative. Fact Sheet for Patients: SugarRoll.be Fact Sheet for Healthcare Providers: https://www.woods-mathews.com/ This test is not yet approved or cleared by the [place] FDA and  has been authorized for detection and/or diagnosis of SARS-CoV-2 by FDA under an Emergency Use Authorization (EUA). This EUA will remain  in effect (meaning this test can be used) for the duration of the COVID-19 declaration under Section 56 4(b)(1) of the Act, 21 U.S.C. section 360bbb-3(b)(1), unless the authorization is terminated or revoked sooner. Performed at [facility], [place] [address], [place], [place] [address]         Code Status Orders  (From admission, onward)         Start     Ordered   01/06/19 2243  Full code  Continuous     01/06/19 2247        Code Status History    This patient has a current code status but no historical code status.   Advance Care Planning Activity    Advance Directive Documentation     Most Recent Value  Type of Advance Directive  Healthcare Power of [place]  Pre-existing out of facility DNR order (yellow form or pink MOST form)  -  "MOST" Form in Place?  -          Follow-up  Information    Ranae Plumber, [place]. Go on 01/16/2019.   Specialty: Family Medicine Why: Hospital Follow up appointment.  1st appointment will be via ZOOM call.  Clinic will call the day prior and provide you with a link.  Will need your last 2 pay stubs in order to get you on the sliding scale for copays Contact information: [place] [address] [phone]           Discharge Medications   Allergies as of 01/10/2019   No Known Allergies     Medication List    TAKE these medications   amLODipine 10 MG tablet Commonly known as: NORVASC Take 10 mg by mouth daily.   famotidine 20 MG tablet Commonly known as: Pepcid Take 1 tablet (20 mg total) by mouth 2 (two) times daily.   hydrOXYzine 25 MG tablet Commonly known as: ATARAX/VISTARIL Take 25 mg by mouth every 4 (four) hours as needed.   ondansetron 4 MG tablet Commonly known as: Zofran Take 1 tablet (4 mg total) by mouth daily as needed for nausea or vomiting.   oxyCODONE-acetaminophen 5-325 MG  tablet Commonly known as: PERCOCET/ROXICET Take 1 tablet by mouth every 4 (four) hours as needed for severe pain.   pantoprazole 20 MG tablet Commonly known as: Protonix Take 1 tablet (20 mg total) by mouth daily.   promethazine 12.5 MG tablet Commonly known as: PHENERGAN Take 1 tablet (12.5 mg total) by mouth every 6 (six) hours as needed.   traMADol 50 MG tablet Commonly known as: Ultram Take 1 tablet (50 mg total) by mouth every 6 (six) hours as needed for severe pain.   traMADol 50 MG tablet Commonly known as: Ultram Take 1 tablet (50 mg total) by mouth every 6 (six) hours as needed.          Total Time in preparing paper work, data evaluation and todays exam - 35 minutes  Auburn Bilberry M.D on 01/10/2019 at 1:47 PM [facility]   Office  [phone]

## 2019-01-10 NOTE — Progress Notes (Signed)
Kristopher [name] is A & O, tolerating diet well, VSS IV removed and intact. Discharge instructions reviewed and prescriptions given to patient who states understanding. Patient refused a wheelchair. I walked with him and his ride to the front door   Allergies as of 01/10/2019   No Known Allergies     Medication List    TAKE these medications   amLODipine 10 MG tablet Commonly known as: NORVASC Take 10 mg by mouth daily.   famotidine 20 MG tablet Commonly known as: Pepcid Take 1 tablet (20 mg total) by mouth 2 (two) times daily.   hydrOXYzine 25 MG tablet Commonly known as: ATARAX/VISTARIL Take 25 mg by mouth every 4 (four) hours as needed.   ondansetron 4 MG tablet Commonly known as: Zofran Take 1 tablet (4 mg total) by mouth daily as needed for nausea or vomiting.   oxyCODONE-acetaminophen 5-325 MG tablet Commonly known as: PERCOCET/ROXICET Take 1 tablet by mouth every 4 (four) hours as needed for severe pain.   pantoprazole 20 MG tablet Commonly known as: Protonix Take 1 tablet (20 mg total) by mouth daily.   promethazine 12.5 MG tablet Commonly known as: PHENERGAN Take 1 tablet (12.5 mg total) by mouth every 6 (six) hours as needed.   traMADol 50 MG tablet Commonly known as: Ultram Take 1 tablet (50 mg total) by mouth every 6 (six) hours as needed for severe pain.   traMADol 50 MG tablet Commonly known as: Ultram Take 1 tablet (50 mg total) by mouth every 6 (six) hours as needed.       Vitals:   01/10/19 1111 01/10/19 1229  BP: (!) 133/97 (!) 141/90  Pulse:  97  Resp:  18  Temp:  99 F (37.2 C)  SpO2:  98%    Haynes Dage

## 2019-01-10 NOTE — Progress Notes (Signed)
Pt BP remained elevated. Primary nurse and paged and spoke to Dr. Jannifer Franklin. MD to place orders. Primary nurse to continue to monitor.

## 2019-01-13 ENCOUNTER — Other Ambulatory Visit: Payer: Self-pay

## 2019-01-13 DIAGNOSIS — F17210 Nicotine dependence, cigarettes, uncomplicated: Secondary | ICD-10-CM | POA: Insufficient documentation

## 2019-01-13 DIAGNOSIS — Z79899 Other long term (current) drug therapy: Secondary | ICD-10-CM | POA: Insufficient documentation

## 2019-01-13 DIAGNOSIS — I10 Essential (primary) hypertension: Secondary | ICD-10-CM | POA: Insufficient documentation

## 2019-01-13 DIAGNOSIS — F10229 Alcohol dependence with intoxication, unspecified: Secondary | ICD-10-CM | POA: Insufficient documentation

## 2019-01-13 DIAGNOSIS — R1013 Epigastric pain: Secondary | ICD-10-CM | POA: Insufficient documentation

## 2019-01-13 DIAGNOSIS — Y908 Blood alcohol level of 240 mg/100 ml or more: Secondary | ICD-10-CM | POA: Insufficient documentation

## 2019-01-13 LAB — COMPREHENSIVE METABOLIC PANEL
ALT: 101 U/L — ABNORMAL HIGH (ref 0–44)
AST: 71 U/L — ABNORMAL HIGH (ref 15–41)
Albumin: 4.1 g/dL (ref 3.5–5.0)
Alkaline Phosphatase: 57 U/L (ref 38–126)
Anion gap: 14 (ref 5–15)
BUN: 7 mg/dL (ref 6–20)
CO2: 20 mmol/L — ABNORMAL LOW (ref 22–32)
Calcium: 9.1 mg/dL (ref 8.9–10.3)
Chloride: 104 mmol/L (ref 98–111)
Creatinine, Ser: 0.77 mg/dL (ref 0.61–1.24)
GFR calc Af Amer: >60 mL/min (ref >60)
GFR calc non Af Amer: >60 mL/min (ref >60)
Glucose, Bld: 172 mg/dL — ABNORMAL HIGH (ref 70–99)
Potassium: 3.6 mmol/L (ref 3.5–5.1)
Sodium: 138 mmol/L (ref 135–145)
Total Bilirubin: 0.4 mg/dL (ref 0.3–1.2)
Total Protein: 7.6 g/dL (ref 6.5–8.1)

## 2019-01-13 LAB — CBC
HCT: 48.8 % (ref 39.0–52.0)
Hemoglobin: 17.2 g/dL — ABNORMAL HIGH (ref 13.0–17.0)
MCH: 32.3 pg (ref 26.0–34.0)
MCHC: 35.2 g/dL (ref 30.0–36.0)
MCV: 91.7 fL (ref 80.0–100.0)
Platelets: 208 10*3/uL (ref 150–400)
RBC: 5.32 MIL/uL (ref 4.22–5.81)
RDW: 13 % (ref 11.5–15.5)
WBC: 6.1 10*3/uL (ref 4.0–10.5)
nRBC: 0 % (ref 0.0–0.2)

## 2019-01-13 LAB — ETHANOL: Alcohol, Ethyl (B): 251 mg/dL — ABNORMAL HIGH (ref <10)

## 2019-01-13 LAB — TROPONIN I (HIGH SENSITIVITY): Troponin I (High Sensitivity): 4 ng/L (ref <18)

## 2019-01-13 LAB — LIPASE, BLOOD: Lipase: 45 U/L (ref 11–51)

## 2019-01-13 NOTE — ED Triage Notes (Signed)
Pt to ED via POV, brought by grandfather. C/o hypertension and abdominal pain. Hx of pancreatitis and alcoholism. Reports drinking "a few" beers tonight" Pt blood pressure WDL at this time, in NAD at this time.

## 2019-01-14 ENCOUNTER — Telehealth: Payer: Self-pay

## 2019-01-14 ENCOUNTER — Emergency Department
Admission: EM | Admit: 2019-01-14 | Discharge: 2019-01-14 | Disposition: A | Discharge status: Home / Self Care | Payer: Self-pay | Attending: Emergency Medicine | Admitting: Emergency Medicine

## 2019-01-14 DIAGNOSIS — F10229 Alcohol dependence with intoxication, unspecified: Secondary | ICD-10-CM

## 2019-01-14 DIAGNOSIS — F10929 Alcohol use, unspecified with intoxication, unspecified: Secondary | ICD-10-CM

## 2019-01-14 DIAGNOSIS — R1013 Epigastric pain: Secondary | ICD-10-CM

## 2019-01-14 MED ORDER — SUCRALFATE 1 G PO TABS: 1.0000 g | ORAL_TABLET | Freq: Four times a day (QID) | ORAL | 1 refills | Status: DC | PRN

## 2019-01-14 NOTE — Discharge Instructions (Signed)
As we discussed, you were intoxicated when he arrived tonight, but the rest your labs are unchanged from before and there is no sign that you need to stay in the hospital or receive additional treatment.  If you keep drinking alcohol you are going to continue having trouble with your pancreas and abdominal pain in general.  Stopping "cold [place]" may lead to some withdrawal symptoms but at least try to decrease the amount of alcohol you are consuming and read through the included information.  Please consider contacting [facility] for help with your addiction.  Use the medications you were prescribed last week and you may also consider using the medication I prescribed tonight to help soothe and coat your stomach.    Return to the emergency department if you develop new or worsening symptoms that concern you.

## 2019-01-14 NOTE — Telephone Encounter (Signed)
Called about becoming a pt at [facility] but his wife said he didn't want to come to the phone.

## 2019-01-14 NOTE — ED Provider Notes (Signed)
[facility] Emergency Department Provider Note  ____________________________________________   First MD Initiated Contact with Patient 01/14/19 0206     (approximate)  I have reviewed the triage vital signs and the nursing notes.   HISTORY  Chief Complaint Hypertension and Abdominal Pain    HPI Kristopher [name] is a 48 y.o. male with history of alcohol dependence and pancreatitis.  He presents tonight by private vehicle for evaluation of recurrent upper abdominal pain similar to prior episodes.  He was hospitalized about a week ago for alcoholic pancreatitis.  He spent several days in the hospital and according the medical record he was eating outside food even though he was to remain n.p.o.  He was eventually discharged with improved status.  Tonight he reports that he feels better than he did earlier and still has some burning pain in his upper abdomen that is moderate.  He has not vomited since yesterday morning.  He admits to some alcohol use although he says it is less than before.  He also came in tonight because he said his blood pressure was elevated but it has improved after being here.  Symptoms are intermittent and nothing in particular makes them better or worse except for drinking alcohol which does make the symptoms worse.  He denies any contact with COVID-19 patients and he denies fever/chills, sore throat, chest pain, shortness of breath.         Past Medical History:  Diagnosis Date  . GERD (gastroesophageal reflux disease)   . Hypertension   . Pancreatitis   . Pancreatitis     Patient Active Problem List   Diagnosis Date Noted  . Acute alcoholic pancreatitis 01/06/2019    Past Surgical History:  Procedure Laterality Date  . ABDOMINAL SURGERY      Prior to Admission medications   Medication Sig Start Date End Date Taking? Authorizing Provider  amLODipine (NORVASC) 10 MG tablet Take 10 mg by mouth daily.    [provider]  famotidine (PEPCID) 20 MG tablet Take 1 tablet (20 mg total) by mouth 2 (two) times daily. Patient not taking: Reported on 01/06/2019 10/29/18   Irean HongSung, Jade J, MD  hydrOXYzine (ATARAX/VISTARIL) 25 MG tablet Take 25 mg by mouth every 4 (four) hours as needed.    [provider]  ondansetron (ZOFRAN) 4 MG tablet Take 1 tablet (4 mg total) by mouth daily as needed for nausea or vomiting. Patient not taking: Reported on 01/06/2019 11/29/18 11/29/19  Concha SeFunke, Mary E, MD  oxyCODONE-acetaminophen (PERCOCET/ROXICET) 5-325 MG tablet Take 1 tablet by mouth every 4 (four) hours as needed for severe pain. Patient not taking: Reported on 01/06/2019 12/19/18   Irean HongSung, Jade J, MD  pantoprazole (PROTONIX) 20 MG tablet Take 1 tablet (20 mg total) by mouth daily. Patient not taking: Reported on 01/06/2019 11/20/18 11/20/19  Willy Eddyobinson, Patrick, MD  promethazine (PHENERGAN) 12.5 MG tablet Take 1 tablet (12.5 mg total) by mouth every 6 (six) hours as needed. Patient not taking: Reported on 01/06/2019 11/20/18   Willy Eddyobinson, Patrick, MD  sucralfate (CARAFATE) 1 g tablet Take 1 tablet (1 g total) by mouth 4 (four) times daily as needed (for abdominal discomfort, nausea, and/or vomiting). 01/14/19   Loleta RoseForbach, Malasia Torain, MD  traMADol (ULTRAM) 50 MG tablet Take 1 tablet (50 mg total) by mouth every 6 (six) hours as needed for severe pain. Patient not taking: Reported on 01/06/2019 11/20/18 11/20/19  Willy Eddyobinson, Patrick, MD  traMADol (ULTRAM) 50 MG tablet Take 1 tablet (  50 mg total) by mouth every 6 (six) hours as needed. Patient not taking: Reported on 01/06/2019 11/30/18 11/30/19  Gregor Hams, MD    Allergies Patient has no known allergies.  No family history on file.  Social History Social History   Tobacco Use  . Smoking status: Current Every Day Smoker    Packs/day: 1.00    Types: Cigarettes  . Smokeless tobacco: Never Used  Substance Use Topics  . Alcohol use: Yes  . Drug use: Not Currently    Types: Marijuana     Review of Systems Constitutional: No fever/chills Eyes: No visual changes. ENT: No sore throat. Cardiovascular: Denies chest pain. Respiratory: Denies shortness of breath. Gastrointestinal: Burning upper abdominal pain with some nausea and vomiting.  Now improved. Genitourinary: Negative for dysuria. Musculoskeletal: Negative for neck pain.  Negative for back pain. Integumentary: Negative for rash. Neurological: Negative for headaches, focal weakness or numbness.   ____________________________________________   PHYSICAL EXAM:  VITAL SIGNS: ED Triage Vitals [01/13/19 2135]  Enc Vitals Group     BP 120/78     Pulse Rate 94     Resp 18     Temp 98.6 F (37 C)     Temp Source Oral     SpO2 97 %     Weight 99.8 kg (220 lb)     Height 1.803 m (5\' 11" )     Head Circumference      Peak Flow      Pain Score 10     Pain Loc      Pain Edu?      Excl. in [place]?     Constitutional: Alert and oriented.  Well-appearing and in no distress. Eyes: Conjunctivae are normal.  Head: Atraumatic. Nose: No congestion/rhinnorhea. Mouth/Throat: Mucous membranes are moist. Neck: No stridor.  No meningeal signs.   Cardiovascular: Normal rate, regular rhythm. Good peripheral circulation. Grossly normal heart sounds. Respiratory: Normal respiratory effort.  No retractions. Gastrointestinal: Soft and nondistended.  Mild tenderness to palpation of the epigastrium with no rebound or guarding, no right upper quadrant tenderness or other abdominal tenderness to palpation. Musculoskeletal: No lower extremity tenderness nor edema. No gross deformities of extremities. Neurologic:  Normal speech and language. No gross focal neurologic deficits are appreciated.  Skin:  Skin is warm, dry and intact. Psychiatric: Mood and affect are normal. Speech and behavior are normal.  ____________________________________________   LABS (all labs ordered are listed, but only abnormal results are displayed)  Labs  Reviewed  COMPREHENSIVE METABOLIC PANEL - Abnormal; Notable for the following components:      Result Value   CO2 20 (*)    Glucose, Bld 172 (*)    AST 71 (*)    ALT 101 (*)    All other components within normal limits  CBC - Abnormal; Notable for the following components:   Hemoglobin 17.2 (*)    All other components within normal limits  ETHANOL - Abnormal; Notable for the following components:   Alcohol, Ethyl (B) 251 (*)    All other components within normal limits  LIPASE, BLOOD  URINALYSIS, COMPLETE (UACMP) WITH MICROSCOPIC  TROPONIN I (HIGH SENSITIVITY)   ____________________________________________  EKG  ED ECG REPORT I, Hinda Kehr, the attending physician, personally viewed and interpreted this ECG.  Date: 01/13/2019 EKG Time: 21: 36 Rate: 89 Rhythm: normal sinus rhythm QRS Axis: normal Intervals: normal ST/T Wave abnormalities: normal Narrative Interpretation: no evidence of acute ischemia  ____________________________________________  RADIOLOGY Ursula Alert, personally  viewed and evaluated these images (plain radiographs) as part of my medical decision making, as well as reviewing the written report by the radiologist.  ED MD interpretation: No indication for emergent imaging  Official radiology report(s): No results found.  ____________________________________________   PROCEDURES   Procedure(s) performed (including Critical Care):  Procedures   ____________________________________________   INITIAL IMPRESSION / MDM / ASSESSMENT AND PLAN / ED COURSE  As part of my medical decision making, I reviewed the following data within the electronic MEDICAL RECORD NUMBER Nursing notes reviewed and incorporated, Labs reviewed , EKG interpreted , Old chart reviewed, Notes from prior ED visits and [place] Controlled Substance Database   Differential diagnosis includes, but is not limited to, recurrent alcoholic pancreatitis, acid reflux, gastritis, secondary  gain.  The patient is not asking for pain medicine at this time.  His vital signs are stable and within normal limits.  He has a little bit of a diastolic hypertension but is generally reassuring.    His comprehensive metabolic panel shows some mild AST and ALT elevation but is otherwise normal and this is chronic for him.  Lipase is normal, troponin is negative, CBC is normal.  When he first checked in 5 hours ago his ethanol level was 251 which is significantly higher than the "little bit" of alcohol he claims he had earlier tonight.  However he is clinically sober at this time and his grandfather is going to pick him up and take him home.  There is no need to keep him in the hospital and he does not require further treatment.  I had my usual customary alcoholic pancreatitis discussion with the patient and he understands and agrees with the plan for outpatient follow-up.       ____________________________________________  FINAL CLINICAL IMPRESSION(S) / ED DIAGNOSES  Final diagnoses:  Alcoholic intoxication with complication (HCC)  Epigastric pain  Alcohol dependence with intoxication with complication (HCC)     MEDICATIONS GIVEN DURING THIS VISIT:  Medications - No data to display   ED Discharge Orders         Ordered    sucralfate (CARAFATE) 1 g tablet  4 times daily PRN     01/14/19 0231          *Please note:  Kristopher [name] was evaluated in Emergency Department on 01/14/2019 for the symptoms described in the history of present illness. He was evaluated in the context of the global COVID-19 pandemic, which necessitated consideration that the patient might be at risk for infection with the SARS-CoV-2 virus that causes COVID-19. Institutional protocols and algorithms that pertain to the evaluation of patients at risk for COVID-19 are in a state of rapid change based on information released by regulatory bodies including the CDC and federal and state organizations. These  policies and algorithms were followed during the patient's care in the ED.  Some ED evaluations and interventions may be delayed as a result of limited staffing during the pandemic.*  Note:  This document was prepared using Dragon voice recognition software and may include unintentional dictation errors.   Loleta Rose, MD 01/14/19 [phone]

## 2019-01-19 ENCOUNTER — Other Ambulatory Visit: Payer: Self-pay

## 2019-01-19 DIAGNOSIS — Y908 Blood alcohol level of 240 mg/100 ml or more: Secondary | ICD-10-CM | POA: Diagnosis present

## 2019-01-19 DIAGNOSIS — Z20828 Contact with and (suspected) exposure to other viral communicable diseases: Secondary | ICD-10-CM | POA: Diagnosis present

## 2019-01-19 DIAGNOSIS — Z823 Family history of stroke: Secondary | ICD-10-CM

## 2019-01-19 DIAGNOSIS — F10239 Alcohol dependence with withdrawal, unspecified: Secondary | ICD-10-CM | POA: Diagnosis not present

## 2019-01-19 DIAGNOSIS — F10229 Alcohol dependence with intoxication, unspecified: Secondary | ICD-10-CM | POA: Diagnosis present

## 2019-01-19 DIAGNOSIS — I10 Essential (primary) hypertension: Secondary | ICD-10-CM | POA: Diagnosis present

## 2019-01-19 DIAGNOSIS — Z79899 Other long term (current) drug therapy: Secondary | ICD-10-CM

## 2019-01-19 DIAGNOSIS — Z5329 Procedure and treatment not carried out because of patient's decision for other reasons: Secondary | ICD-10-CM | POA: Diagnosis not present

## 2019-01-19 DIAGNOSIS — F17210 Nicotine dependence, cigarettes, uncomplicated: Secondary | ICD-10-CM | POA: Diagnosis present

## 2019-01-19 DIAGNOSIS — K219 Gastro-esophageal reflux disease without esophagitis: Secondary | ICD-10-CM | POA: Diagnosis present

## 2019-01-19 DIAGNOSIS — K2920 Alcoholic gastritis without bleeding: Principal | ICD-10-CM | POA: Diagnosis present

## 2019-01-19 LAB — COMPREHENSIVE METABOLIC PANEL
ALT: 134 U/L — ABNORMAL HIGH (ref 0–44)
AST: 132 U/L — ABNORMAL HIGH (ref 15–41)
Albumin: 4.5 g/dL (ref 3.5–5.0)
Alkaline Phosphatase: 73 U/L (ref 38–126)
Anion gap: 13 (ref 5–15)
BUN: <5 mg/dL — ABNORMAL LOW (ref 6–20)
CO2: 24 mmol/L (ref 22–32)
Calcium: 9.1 mg/dL (ref 8.9–10.3)
Chloride: 99 mmol/L (ref 98–111)
Creatinine, Ser: 0.8 mg/dL (ref 0.61–1.24)
GFR calc Af Amer: >60 mL/min (ref >60)
GFR calc non Af Amer: >60 mL/min (ref >60)
Glucose, Bld: 139 mg/dL — ABNORMAL HIGH (ref 70–99)
Potassium: 3.8 mmol/L (ref 3.5–5.1)
Sodium: 136 mmol/L (ref 135–145)
Total Bilirubin: 1 mg/dL (ref 0.3–1.2)
Total Protein: 8 g/dL (ref 6.5–8.1)

## 2019-01-19 LAB — CBC
HCT: 47.8 % (ref 39.0–52.0)
Hemoglobin: 16.9 g/dL (ref 13.0–17.0)
MCH: 32.3 pg (ref 26.0–34.0)
MCHC: 35.4 g/dL (ref 30.0–36.0)
MCV: 91.4 fL (ref 80.0–100.0)
Platelets: 254 10*3/uL (ref 150–400)
RBC: 5.23 MIL/uL (ref 4.22–5.81)
RDW: 13.4 % (ref 11.5–15.5)
WBC: 8.5 10*3/uL (ref 4.0–10.5)
nRBC: 0 % (ref 0.0–0.2)

## 2019-01-19 LAB — LIPASE, BLOOD: Lipase: 33 U/L (ref 11–51)

## 2019-01-19 MED ORDER — SODIUM CHLORIDE 0.9% FLUSH: 3.0000 mL | Freq: Once | INTRAVENOUS | Status: DC

## 2019-01-19 NOTE — ED Triage Notes (Addendum)
Pt comes via POV with abdominal pain. Pt states this started yesterday. Pt states N/V/D.  Pt states right sided around to back pain. Pt denies any urinary symptoms.  Pt states his BP is high. Pt has hx of pancreatitis

## 2019-01-20 ENCOUNTER — Encounter: Payer: Self-pay | Admitting: Emergency Medicine

## 2019-01-20 ENCOUNTER — Inpatient Hospital Stay
Admission: EM | Admit: 2019-01-20 | Discharge: 2019-01-22 | DRG: 392 | Discharge status: Against Medical Advice | Payer: Self-pay | Attending: Internal Medicine | Admitting: Internal Medicine

## 2019-01-20 ENCOUNTER — Emergency Department: Payer: Self-pay

## 2019-01-20 DIAGNOSIS — R109 Unspecified abdominal pain: Secondary | ICD-10-CM | POA: Diagnosis present

## 2019-01-20 DIAGNOSIS — F1010 Alcohol abuse, uncomplicated: Secondary | ICD-10-CM

## 2019-01-20 DIAGNOSIS — R112 Nausea with vomiting, unspecified: Secondary | ICD-10-CM | POA: Diagnosis present

## 2019-01-20 DIAGNOSIS — K859 Acute pancreatitis without necrosis or infection, unspecified: Secondary | ICD-10-CM

## 2019-01-20 DIAGNOSIS — F10929 Alcohol use, unspecified with intoxication, unspecified: Secondary | ICD-10-CM

## 2019-01-20 HISTORY — DX: Alcohol dependence, uncomplicated: F10.20

## 2019-01-20 LAB — COMPREHENSIVE METABOLIC PANEL
ALT: 123 U/L — ABNORMAL HIGH (ref 0–44)
AST: 113 U/L — ABNORMAL HIGH (ref 15–41)
Albumin: 4.1 g/dL (ref 3.5–5.0)
Alkaline Phosphatase: 71 U/L (ref 38–126)
Anion gap: 10 (ref 5–15)
BUN: <5 mg/dL — ABNORMAL LOW (ref 6–20)
CO2: 25 mmol/L (ref 22–32)
Calcium: 8.5 mg/dL — ABNORMAL LOW (ref 8.9–10.3)
Chloride: 103 mmol/L (ref 98–111)
Creatinine, Ser: 0.8 mg/dL (ref 0.61–1.24)
GFR calc Af Amer: >60 mL/min (ref >60)
GFR calc non Af Amer: >60 mL/min (ref >60)
Glucose, Bld: 139 mg/dL — ABNORMAL HIGH (ref 70–99)
Potassium: 4.1 mmol/L (ref 3.5–5.1)
Sodium: 138 mmol/L (ref 135–145)
Total Bilirubin: 1.2 mg/dL (ref 0.3–1.2)
Total Protein: 7.5 g/dL (ref 6.5–8.1)

## 2019-01-20 LAB — PHOSPHORUS: Phosphorus: 3.1 mg/dL (ref 2.5–4.6)

## 2019-01-20 LAB — URINE DRUG SCREEN, QUALITATIVE (ARMC ONLY)
Amphetamines, Ur Screen: NOT DETECTED
Barbiturates, Ur Screen: NOT DETECTED
Benzodiazepine, Ur Scrn: NOT DETECTED
Cannabinoid 50 Ng, Ur ~~LOC~~: NOT DETECTED
Cocaine Metabolite,Ur ~~LOC~~: NOT DETECTED
MDMA (Ecstasy)Ur Screen: NOT DETECTED
Methadone Scn, Ur: NOT DETECTED
Opiate, Ur Screen: NOT DETECTED
Phencyclidine (PCP) Ur S: NOT DETECTED
Tricyclic, Ur Screen: NOT DETECTED

## 2019-01-20 LAB — URINALYSIS, COMPLETE (UACMP) WITH MICROSCOPIC
Bacteria, UA: NONE SEEN
Bilirubin Urine: NEGATIVE
Glucose, UA: NEGATIVE mg/dL
Hgb urine dipstick: NEGATIVE
Ketones, ur: NEGATIVE mg/dL
Leukocytes,Ua: NEGATIVE
Nitrite: NEGATIVE
Protein, ur: NEGATIVE mg/dL
Specific Gravity, Urine: 1.014 (ref 1.005–1.030)
Squamous Epithelial / HPF: NONE SEEN (ref 0–5)
pH: 6 (ref 5.0–8.0)

## 2019-01-20 LAB — MAGNESIUM
Magnesium: 2.2 mg/dL (ref 1.7–2.4)
Magnesium: 1.6 mg/dL — ABNORMAL LOW (ref 1.7–2.4)

## 2019-01-20 LAB — PROTIME-INR
INR: 0.9 (ref 0.8–1.2)
Prothrombin Time: 12.3 seconds (ref 11.4–15.2)

## 2019-01-20 LAB — CBC
HCT: 48.2 % (ref 39.0–52.0)
Hemoglobin: 16.6 g/dL (ref 13.0–17.0)
MCH: 32.2 pg (ref 26.0–34.0)
MCHC: 34.4 g/dL (ref 30.0–36.0)
MCV: 93.6 fL (ref 80.0–100.0)
Platelets: 253 10*3/uL (ref 150–400)
RBC: 5.15 MIL/uL (ref 4.22–5.81)
RDW: 13.5 % (ref 11.5–15.5)
WBC: 7.3 10*3/uL (ref 4.0–10.5)
nRBC: 0 % (ref 0.0–0.2)

## 2019-01-20 LAB — GLUCOSE, CAPILLARY: Glucose-Capillary: 138 mg/dL — ABNORMAL HIGH (ref 70–99)

## 2019-01-20 LAB — SARS CORONAVIRUS 2 (TAT 6-24 HRS): SARS Coronavirus 2: NEGATIVE

## 2019-01-20 LAB — ETHANOL: Alcohol, Ethyl (B): 250 mg/dL — ABNORMAL HIGH (ref <10)

## 2019-01-20 MED ORDER — AMLODIPINE BESYLATE 10 MG PO TABS
10.0000 mg | ORAL_TABLET | Freq: Every day | ORAL | Status: DC
  Administered 2019-01-20 – 2019-01-21 (×3): 10 mg via ORAL
  Filled 2019-01-20: qty 2
  Filled 2019-01-20 (×2): qty 1

## 2019-01-20 MED ORDER — OXYCODONE-ACETAMINOPHEN 5-325 MG PO TABS
1.0000 | ORAL_TABLET | Freq: Four times a day (QID) | ORAL | Status: DC | PRN
  Administered 2019-01-20 – 2019-01-21 (×3): 1 via ORAL
  Filled 2019-01-20 (×3): qty 1

## 2019-01-20 MED ORDER — TRAZODONE HCL 50 MG PO TABS
25.0000 mg | ORAL_TABLET | Freq: Every evening | ORAL | Status: DC | PRN
  Administered 2019-01-20: 25 mg via ORAL
  Filled 2019-01-20: qty 1
  Filled 2019-01-20: qty 0.5

## 2019-01-20 MED ORDER — ONDANSETRON HCL 4 MG PO TABS
4.0000 mg | ORAL_TABLET | Freq: Four times a day (QID) | ORAL | Status: DC | PRN
  Administered 2019-01-20 – 2019-01-21 (×2): 4 mg via ORAL
  Filled 2019-01-20 (×3): qty 1

## 2019-01-20 MED ORDER — DROPERIDOL 2.5 MG/ML IJ SOLN
2.5000 mg | Freq: Once | INTRAMUSCULAR | Status: AC
  Administered 2019-01-20: 2.5 mg via INTRAVENOUS
  Filled 2019-01-20: qty 2

## 2019-01-20 MED ORDER — LORAZEPAM 2 MG/ML IJ SOLN
1.0000 mg | INTRAMUSCULAR | Status: DC | PRN
  Administered 2019-01-20: 2 mg via INTRAVENOUS
  Filled 2019-01-20: qty 1

## 2019-01-20 MED ORDER — SUCRALFATE 1 G PO TABS
1.0000 g | ORAL_TABLET | Freq: Four times a day (QID) | ORAL | Status: DC | PRN
  Filled 2019-01-20: qty 1

## 2019-01-20 MED ORDER — LABETALOL HCL 5 MG/ML IV SOLN
10.0000 mg | Freq: Once | INTRAVENOUS | Status: AC
  Administered 2019-01-20: 10 mg via INTRAVENOUS
  Filled 2019-01-20: qty 4

## 2019-01-20 MED ORDER — VITAMIN B-1 100 MG PO TABS
100.0000 mg | ORAL_TABLET | Freq: Every day | ORAL | Status: DC
  Administered 2019-01-20 – 2019-01-21 (×2): 100 mg via ORAL
  Filled 2019-01-20 (×2): qty 1

## 2019-01-20 MED ORDER — MORPHINE SULFATE (PF) 2 MG/ML IV SOLN
2.0000 mg | INTRAVENOUS | Status: DC | PRN
  Administered 2019-01-20: 2 mg via INTRAVENOUS
  Filled 2019-01-20: qty 1

## 2019-01-20 MED ORDER — PANTOPRAZOLE SODIUM 40 MG IV SOLR
40.0000 mg | Freq: Two times a day (BID) | INTRAVENOUS | Status: DC
  Administered 2019-01-20 – 2019-01-21 (×3): 40 mg via INTRAVENOUS
  Filled 2019-01-20 (×4): qty 40

## 2019-01-20 MED ORDER — SODIUM CHLORIDE 0.9 % IV SOLN
Freq: Once | INTRAVENOUS | Status: AC
  Administered 2019-01-20: 10:00:00 via INTRAVENOUS

## 2019-01-20 MED ORDER — HYDROMORPHONE HCL 1 MG/ML IJ SOLN
2.0000 mg | INTRAMUSCULAR | Status: DC | PRN
  Administered 2019-01-20 – 2019-01-21 (×4): 2 mg via INTRAVENOUS
  Filled 2019-01-20 (×4): qty 2

## 2019-01-20 MED ORDER — NICOTINE 21 MG/24HR TD PT24
21.0000 mg | MEDICATED_PATCH | Freq: Every day | TRANSDERMAL | Status: DC
  Administered 2019-01-20 – 2019-01-22 (×3): 21 mg via TRANSDERMAL
  Filled 2019-01-20 (×3): qty 1

## 2019-01-20 MED ORDER — ACETAMINOPHEN 325 MG PO TABS: 650.0000 mg | ORAL_TABLET | Freq: Four times a day (QID) | ORAL | Status: DC | PRN

## 2019-01-20 MED ORDER — THIAMINE HCL 100 MG/ML IJ SOLN
100.0000 mg | Freq: Once | INTRAMUSCULAR | Status: AC
  Administered 2019-01-20: 100 mg via INTRAVENOUS
  Filled 2019-01-20: qty 2

## 2019-01-20 MED ORDER — SODIUM CHLORIDE 0.9 % IV SOLN
INTRAVENOUS | Status: DC
  Administered 2019-01-20 – 2019-01-22 (×4): via INTRAVENOUS

## 2019-01-20 MED ORDER — MORPHINE SULFATE (PF) 4 MG/ML IV SOLN
4.0000 mg | Freq: Once | INTRAVENOUS | Status: AC
  Administered 2019-01-20: 4 mg via INTRAVENOUS
  Filled 2019-01-20: qty 1

## 2019-01-20 MED ORDER — ACETAMINOPHEN 650 MG RE SUPP
650.0000 mg | Freq: Four times a day (QID) | RECTAL | Status: DC | PRN
  Filled 2019-01-20: qty 1

## 2019-01-20 MED ORDER — LORAZEPAM 1 MG PO TABS
1.0000 mg | ORAL_TABLET | ORAL | Status: DC | PRN
  Administered 2019-01-20: 2 mg via ORAL
  Administered 2019-01-21: 3 mg via ORAL
  Administered 2019-01-21: 2 mg via ORAL
  Administered 2019-01-21: 1 mg via ORAL
  Administered 2019-01-21: 2 mg via ORAL
  Administered 2019-01-21: 1 mg via ORAL
  Administered 2019-01-22: 2 mg via ORAL
  Administered 2019-01-22: 3 mg via ORAL
  Administered 2019-01-22: 2 mg via ORAL
  Administered 2019-01-22: 1 mg via ORAL
  Filled 2019-01-20: qty 3
  Filled 2019-01-20: qty 2
  Filled 2019-01-20: qty 3
  Filled 2019-01-20: qty 1
  Filled 2019-01-20 (×2): qty 2
  Filled 2019-01-20: qty 1
  Filled 2019-01-20 (×2): qty 2
  Filled 2019-01-20: qty 1

## 2019-01-20 MED ORDER — THIAMINE HCL 100 MG/ML IJ SOLN: 100.0000 mg | Freq: Every day | INTRAMUSCULAR | Status: DC

## 2019-01-20 MED ORDER — ADULT MULTIVITAMIN W/MINERALS CH
1.0000 | ORAL_TABLET | Freq: Every day | ORAL | Status: DC
  Administered 2019-01-20 – 2019-01-21 (×2): 1 via ORAL
  Filled 2019-01-20 (×2): qty 1

## 2019-01-20 MED ORDER — SODIUM CHLORIDE 0.9 % IV BOLUS
1000.0000 mL | Freq: Once | INTRAVENOUS | Status: AC
  Administered 2019-01-20: 1000 mL via INTRAVENOUS

## 2019-01-20 MED ORDER — ONDANSETRON HCL 4 MG/2ML IJ SOLN: 4.0000 mg | Freq: Four times a day (QID) | INTRAMUSCULAR | Status: DC | PRN

## 2019-01-20 MED ORDER — THIAMINE HCL 100 MG/ML IJ SOLN
Freq: Once | INTRAVENOUS | Status: AC
  Administered 2019-01-20: 07:00:00 via INTRAVENOUS
  Filled 2019-01-20: qty 1000

## 2019-01-20 MED ORDER — IOHEXOL 300 MG/ML  SOLN
100.0000 mL | Freq: Once | INTRAMUSCULAR | Status: AC | PRN
  Administered 2019-01-20: 100 mL via INTRAVENOUS

## 2019-01-20 MED ORDER — LORAZEPAM 2 MG/ML IJ SOLN
1.0000 mg | INTRAMUSCULAR | Status: DC | PRN
  Administered 2019-01-20: 1 mg via INTRAVENOUS
  Filled 2019-01-20: qty 1

## 2019-01-20 MED ORDER — HYDROXYZINE HCL 25 MG PO TABS
25.0000 mg | ORAL_TABLET | ORAL | Status: DC | PRN
  Filled 2019-01-20: qty 1

## 2019-01-20 MED ORDER — FOLIC ACID 1 MG PO TABS
1.0000 mg | ORAL_TABLET | Freq: Every day | ORAL | Status: DC
  Administered 2019-01-20 – 2019-01-21 (×2): 1 mg via ORAL
  Filled 2019-01-20 (×2): qty 1

## 2019-01-20 NOTE — ED Notes (Signed)
Pt oriented to unit at this time.  Introduced myself and Economist, Therapist, sports.  Pt given call bell and showed how to use, given water and lights turned down.  Patient denies needs at this time.  Will continue to monitor.

## 2019-01-20 NOTE — ED Notes (Signed)
Report given to Andrea RN

## 2019-01-20 NOTE — H&P (Signed)
[employer] - [place] [facility]   PATIENT NAME: Kristopher [name]    MR#:  [id number]  DATE OF BIRTH:  10/06/1970  DATE OF ADMISSION:  01/20/2019  PRIMARY CARE PHYSICIAN: Patient, No Pcp Per   REQUESTING/REFERRING PHYSICIAN: Loleta Rose, MD CHIEF COMPLAINT:   Chief Complaint  Patient presents with  . Abdominal Pain    HISTORY OF PRESENT ILLNESS:  Kristopher [name]  is a 48 y.o. male with a known history of alcohol abuse, GERD hypertension and chronic pancreatitis, who presented to the emergency room with acute onset of intractable nausea and vomiting with associated epigastric and right upper quadrant abdominal pain.  Denies any diarrhea or melena or bright red bleeding per rectum.  He denied any bilious vomitus.  Stated it has been brownish with no bright red blood.  No dyspnea or cough or wheezing.  No dysuria, oliguria or hematuria or flank pain.  He continues to drink alcohol heavily.  Upon presentation to the emergency room, blood pressure was elevated 162/103 with otherwise normal vital signs.  Labs revealed lipase levels 33 and alcohol level of 250.  His COVID-19 test is currently pending.  UA was unremarkable and urine drug screen came back negative.  The patient was given 1 L bolus of IV normal saline 4 mg of IV morphine sulfate and 2.5 mg of IV and Naprosyn and 100 g IV thiamine.  He will be admitted to an observation medically monitored bed for further evaluation and management.  PAST MEDICAL HISTORY:   Past Medical History:  Diagnosis Date  . Alcohol dependence (HCC)   . GERD (gastroesophageal reflux disease)   . Hypertension   . Pancreatitis   . Pancreatitis     PAST SURGICAL HISTORY:   Past Surgical History:  Procedure Laterality Date  . ABDOMINAL SURGERY      SOCIAL HISTORY:   Social History   Tobacco Use  . Smoking status: Current Every Day Smoker    Packs/day: 1.00    Types: Cigarettes  . Smokeless tobacco: Never Used   Substance Use Topics  . Alcohol use: Yes    FAMILY HISTORY:  Positive for cancer in his grandfather and CVA in his grandmother  DRUG ALLERGIES:  No Known Allergies  REVIEW OF SYSTEMS:   ROS As per history of present illness. All pertinent systems were reviewed above. Constitutional,  HEENT, cardiovascular, respiratory, GI, GU, musculoskeletal, neuro, psychiatric, endocrine,  integumentary and hematologic systems were reviewed and are otherwise  negative/unremarkable except for positive findings mentioned above in the HPI.   MEDICATIONS AT HOME:   Prior to Admission medications   Medication Sig Start Date End Date Taking? Authorizing Provider  amLODipine (NORVASC) 10 MG tablet Take 10 mg by mouth daily.    [provider]  famotidine (PEPCID) 20 MG tablet Take 1 tablet (20 mg total) by mouth 2 (two) times daily. Patient not taking: Reported on 01/06/2019 10/29/18   Irean Hong, MD  hydrOXYzine (ATARAX/VISTARIL) 25 MG tablet Take 25 mg by mouth every 4 (four) hours as needed.    [provider]  ondansetron (ZOFRAN) 4 MG tablet Take 1 tablet (4 mg total) by mouth daily as needed for nausea or vomiting. Patient not taking: Reported on 01/06/2019 11/29/18 11/29/19  Concha Se, MD  oxyCODONE-acetaminophen (PERCOCET/ROXICET) 5-325 MG tablet Take 1 tablet by mouth every 4 (four) hours as needed for severe pain. Patient not taking: Reported on 01/06/2019 12/19/18   Irean Hong, MD  pantoprazole (  PROTONIX) 20 MG tablet Take 1 tablet (20 mg total) by mouth daily. Patient not taking: Reported on 01/06/2019 11/20/18 11/20/19  Willy Eddyobinson, Patrick, MD  promethazine (PHENERGAN) 12.5 MG tablet Take 1 tablet (12.5 mg total) by mouth every 6 (six) hours as needed. Patient not taking: Reported on 01/06/2019 11/20/18   Willy Eddyobinson, Patrick, MD  sucralfate (CARAFATE) 1 g tablet Take 1 tablet (1 g total) by mouth 4 (four) times daily as needed (for abdominal discomfort, nausea, and/or  vomiting). 01/14/19   Loleta RoseForbach, Cory, MD  traMADol (ULTRAM) 50 MG tablet Take 1 tablet (50 mg total) by mouth every 6 (six) hours as needed for severe pain. Patient not taking: Reported on 01/06/2019 11/20/18 11/20/19  Willy Eddyobinson, Patrick, MD  traMADol (ULTRAM) 50 MG tablet Take 1 tablet (50 mg total) by mouth every 6 (six) hours as needed. Patient not taking: Reported on 01/06/2019 11/30/18 11/30/19  Darci CurrentBrown, [place] N, MD      VITAL SIGNS:  Blood pressure (!) 149/104, pulse 83, temperature 98.5 F (36.9 C), temperature source Oral, resp. rate 14, height 5\' 11"  (1.803 m), weight 90.7 kg, SpO2 99 %.  PHYSICAL EXAMINATION:  Physical Exam  GENERAL:  48 y.o.-year-old Caucasian male patient lying in the bed with no acute distress.  EYES: Pupils equal, round, reactive to light and accommodation. No scleral icterus. Extraocular muscles intact.  HEENT: Head atraumatic, normocephalic. Oropharynx and nasopharynx clear.  NECK:  Supple, no jugular venous distention. No thyroid enlargement, no tenderness.  LUNGS: Normal breath sounds bilaterally, no wheezing, rales,rhonchi or crepitation. No use of accessory muscles of respiration.  CARDIOVASCULAR: Regular rate and rhythm, S1, S2 normal. No murmurs, rubs, or gallops.  ABDOMEN: Soft with mild epigastric right upper quadrant tenderness without apparent tenderness guarding or rigidity.  Bowel sounds present. No organomegaly or mass.  EXTREMITIES: No pedal edema, cyanosis, or clubbing.  NEUROLOGIC: Cranial nerves II through XII are intact. Muscle strength 5/5 in all extremities. Sensation intact. Gait not checked.  PSYCHIATRIC: The patient is alert and oriented x 3.  Normal affect and good eye contact. SKIN: No obvious rash, lesion, or ulcer.   LABORATORY PANEL:   CBC Recent Labs  Lab 01/19/19 2215  WBC 8.5  HGB 16.9  HCT 47.8  PLT 254   ------------------------------------------------------------------------------------------------------------------   Chemistries  Recent Labs  Lab 01/19/19 2215 01/20/19 0308  NA 136  --   K 3.8  --   CL 99  --   CO2 24  --   GLUCOSE 139*  --   BUN <5*  --   CREATININE 0.80  --   CALCIUM 9.1  --   MG  --  2.2  AST 132*  --   ALT 134*  --   ALKPHOS 73  --   BILITOT 1.0  --    ------------------------------------------------------------------------------------------------------------------  Cardiac Enzymes No results for input(s): TROPONINI in the last 168 hours. ------------------------------------------------------------------------------------------------------------------  RADIOLOGY:  Ct Abdomen Pelvis W Contrast  Result Date: 01/20/2019 CLINICAL DATA:  48 year old male with nausea and vomiting. EXAM: CT ABDOMEN AND PELVIS WITH CONTRAST TECHNIQUE: Multidetector CT imaging of the abdomen and pelvis was performed using the standard protocol following bolus administration of intravenous contrast. CONTRAST:  100mL OMNIPAQUE IOHEXOL 300 MG/ML  SOLN COMPARISON:  CT of the abdomen pelvis dated 11/29/2018 FINDINGS: Lower chest: The visualized lung bases are clear. No intra-abdominal free air or free fluid. Hepatobiliary: There is diffuse fatty infiltration of the liver. No intrahepatic biliary ductal dilatation. The gallbladder is unremarkable. Pancreas: Unremarkable.  No pancreatic ductal dilatation or surrounding inflammatory changes. Spleen: Normal in size without focal abnormality. Adrenals/Urinary Tract: The adrenal glands are unremarkable. There is no hydronephrosis on either side. There is symmetric enhancement and excretion of contrast by both kidneys. The visualized ureters and urinary bladder appear unremarkable. Stomach/Bowel: There is no bowel obstruction or active inflammation. The appendix is normal. Vascular/Lymphatic: The abdominal aorta and IVC are unremarkable. No portal venous gas. There is no adenopathy. Reproductive: The prostate and seminal vesicles are grossly unremarkable. No pelvic  mass. Other: None Musculoskeletal: No acute or significant osseous findings. IMPRESSION: 1. No acute intra-abdominal or pelvic pathology. No bowel obstruction or active inflammation. Normal appendix. 2. Fatty liver. Electronically Signed   By: Anner Crete M.D.   On: 01/20/2019 03:27      IMPRESSION AND PLAN:   1.  Acute gastritis likely alcoholic, mainly the culprit for his abdominal pain.  There could be an element of exacerbation of chronic pancreatitis.  The patient will be admitted to the medical monitored bed.  He will be placed on IV PPI therapy with Protonix.  As needed antiemetics will be provided.  Will follow CBC.  We will continue Carafate.  2.  Alcohol abuse with current alcohol intoxication.  We will place the patient on as needed IV Ativan for alcohol withdrawal as well as a banana bag daily.  I counseled him for cessation.  3.  Hypertension.  We will continue amlodipine.  4.  DVT prophylaxis.  SCDs for now.   All the records are reviewed and case discussed with ED provider. The plan of care was discussed in details with the patient (and family). I answered all questions. The patient agreed to proceed with the above mentioned plan. Further management will depend upon hospital course.   CODE STATUS: Full code  TOTAL TIME TAKING CARE OF THIS PATIENT: 50 minutes.    Christel Mormon M.D on 01/20/2019 at 5:37 AM  Pager - [phone]  After 6pm go to [email or web address] - Proofreader  [employer]  Hospitalists  Office  [phone]  CC: Primary care physician; Patient, No Pcp Per   Note: This dictation was prepared with Dragon dictation along with smaller phrase technology. Any transcriptional errors that result from this process are unintentional.

## 2019-01-20 NOTE — ED Notes (Signed)
Pt returned from CT °

## 2019-01-20 NOTE — ED Notes (Signed)
Admitting, Dr. Posey Pronto, at bedside.

## 2019-01-20 NOTE — ED Notes (Signed)
Pt talking to family on the phone at this time.

## 2019-01-20 NOTE — ED Provider Notes (Signed)
[facility] Emergency Department Provider Note  ____________________________________________   First MD Initiated Contact with Patient 01/20/19 0236     (approximate)  I have reviewed the triage vital signs and the nursing notes.   HISTORY  Chief Complaint Abdominal Pain    HPI Kristopher [name] is a 48 y.o. male with a history of chronic alcohol abuse and recurrent pancreatitis as well as chronically elevated LFTs who presents by private vehicle (he states accompanied by law enforcement) for evaluation of 2 days of persistent severe generalized abdominal pain rating to the back that is sharp and aching, nausea, and vomiting.  He said he is not able to tolerate anything to eat or drink although he does admit to drinking "a little bit" of alcohol.  The symptoms are severe and nothing in particular makes them better or worse and they have been gradually getting worse over the last 2 days.  I saw this patient for similar but milder symptoms about a week ago in the ED and he was able to be discharged.  He says the pain is much worse this time and he is concerned because he cannot eat or drink anything without throwing up.  It feels similar to prior episodes where he is required hospitalization.  He denies drug use.  He denies contact with COVID-19 patients.  He denies sore throat, chest pain, shortness of breath.         Past Medical History:  Diagnosis Date  . Alcohol dependence ([place])   . GERD (gastroesophageal reflux disease)   . Hypertension   . Pancreatitis   . Pancreatitis     Patient Active Problem List   Diagnosis Date Noted  . Intractable nausea and vomiting 01/20/2019  . Acute alcoholic pancreatitis 24/58/0998    Past Surgical History:  Procedure Laterality Date  . ABDOMINAL SURGERY      Prior to Admission medications   Medication Sig Start Date End Date Taking? Authorizing Provider  amLODipine (NORVASC) 10 MG tablet Take 10 mg by mouth  daily.   Yes [provider]  hydrOXYzine (ATARAX/VISTARIL) 25 MG tablet Take 25 mg by mouth every 4 (four) hours as needed.   Yes [provider]  sucralfate (CARAFATE) 1 g tablet Take 1 tablet (1 g total) by mouth 4 (four) times daily as needed (for abdominal discomfort, nausea, and/or vomiting). 01/14/19  Yes Hinda Kehr, MD  famotidine (PEPCID) 20 MG tablet Take 1 tablet (20 mg total) by mouth 2 (two) times daily. Patient not taking: Reported on 01/06/2019 10/29/18   Paulette Blanch, MD  ondansetron (ZOFRAN) 4 MG tablet Take 1 tablet (4 mg total) by mouth daily as needed for nausea or vomiting. Patient not taking: Reported on 01/06/2019 11/29/18 11/29/19  Vanessa Iuka, MD  oxyCODONE-acetaminophen (PERCOCET/ROXICET) 5-325 MG tablet Take 1 tablet by mouth every 4 (four) hours as needed for severe pain. Patient not taking: Reported on 01/06/2019 12/19/18   Paulette Blanch, MD  pantoprazole (PROTONIX) 20 MG tablet Take 1 tablet (20 mg total) by mouth daily. Patient not taking: Reported on 01/06/2019 11/20/18 11/20/19  Merlyn Lot, MD  promethazine (PHENERGAN) 12.5 MG tablet Take 1 tablet (12.5 mg total) by mouth every 6 (six) hours as needed. Patient not taking: Reported on 01/06/2019 11/20/18   Merlyn Lot, MD  traMADol (ULTRAM) 50 MG tablet Take 1 tablet (50 mg total) by mouth every 6 (six) hours as needed for severe pain. Patient not taking: Reported on 01/06/2019 11/20/18  11/20/19  Willy Eddyobinson, Patrick, MD  traMADol (ULTRAM) 50 MG tablet Take 1 tablet (50 mg total) by mouth every 6 (six) hours as needed. Patient not taking: Reported on 01/20/2019 11/30/18 11/30/19  Darci CurrentBrown, [place] N, MD    Allergies Patient has no known allergies.  History reviewed. No pertinent family history.  Social History Social History   Tobacco Use  . Smoking status: Current Every Day Smoker    Packs/day: 1.00    Types: Cigarettes  . Smokeless tobacco: Never Used  Substance Use Topics  . Alcohol  use: Yes  . Drug use: Not Currently    Types: Marijuana    Review of Systems Constitutional: No fever/chills Eyes: No visual changes. ENT: No sore throat. Cardiovascular: Denies chest pain. Respiratory: Denies shortness of breath. Gastrointestinal: Generalized abdominal pain with nausea and vomiting x2 days as described above.   Genitourinary: Negative for dysuria. Musculoskeletal: Negative for neck pain.  Negative for back pain. Integumentary: Negative for rash. Neurological: Negative for headaches, focal weakness or numbness.   ____________________________________________   PHYSICAL EXAM:  VITAL SIGNS: ED Triage Vitals  Enc Vitals Group     BP 01/19/19 2207 (!) 162/103     Pulse Rate 01/19/19 2207 95     Resp 01/19/19 2207 16     Temp 01/19/19 2207 98.5 F (36.9 C)     Temp Source 01/19/19 2207 Oral     SpO2 01/19/19 2207 97 %     Weight 01/19/19 2207 90.7 kg (200 lb)     Height 01/19/19 2207 1.803 m (5\' 11" )     Head Circumference --      Peak Flow --      Pain Score 01/19/19 2213 8     Pain Loc --      Pain Edu? --      Excl. in GC? --     Constitutional: Alert and oriented.  Appears to be in pain. Eyes: Conjunctivae are normal.  Head: Atraumatic. Nose: No congestion/rhinnorhea. Mouth/Throat: Patient is wearing a mask. Neck: No stridor.  No meningeal signs.   Cardiovascular: Normal rate, regular rhythm. Good peripheral circulation. Grossly normal heart sounds. Respiratory: Normal respiratory effort.  No retractions. Gastrointestinal: Soft and mildly distended.  Moderate to severe tenderness to palpation throughout the abdomen without any specific focal tenderness although he does appear more tender in the upper abdomen/epigastrium. Musculoskeletal: No lower extremity tenderness nor edema. No gross deformities of extremities. Neurologic:  Normal speech and language. No gross focal neurologic deficits are appreciated.  Skin:  Skin is warm, dry and intact.  Psychiatric: Mood and affect are somewhat labile.  He appears remorseful about his alcohol use and then intermittently seems angry.  ____________________________________________   LABS (all labs ordered are listed, but only abnormal results are displayed)  Labs Reviewed  COMPREHENSIVE METABOLIC PANEL - Abnormal; Notable for the following components:      Result Value   Glucose, Bld 139 (*)    BUN <5 (*)    AST 132 (*)    ALT 134 (*)    All other components within normal limits  URINALYSIS, COMPLETE (UACMP) WITH MICROSCOPIC - Abnormal; Notable for the following components:   Color, Urine STRAW (*)    APPearance CLEAR (*)    All other components within normal limits  ETHANOL - Abnormal; Notable for the following components:   Alcohol, Ethyl (B) 250 (*)    All other components within normal limits  SARS CORONAVIRUS 2 (TAT 6-24 HRS)  LIPASE, BLOOD  CBC  MAGNESIUM  PROTIME-INR  URINE DRUG SCREEN, QUALITATIVE ([facility] ONLY)   ____________________________________________  EKG  ED ECG REPORT I, Loleta Rose, the attending physician, personally viewed and interpreted this ECG.  Date: 01/20/2019 EKG Time: 4:10 AM Rate: 86 Rhythm: normal sinus rhythm QRS Axis: normal Intervals: normal ST/T Wave abnormalities: Non-specific ST segment / T-wave changes, but no clear evidence of acute ischemia. Narrative Interpretation: no definitive evidence of acute ischemia; does not meet STEMI criteria.   ____________________________________________  RADIOLOGY I, Loleta Rose, personally viewed and evaluated these images (plain radiographs) as part of my medical decision making, as well as reviewing the written report by the radiologist.  ED MD interpretation: No acute abnormalities identified on CT scan of the abdomen and pelvis  Official radiology report(s): Ct Abdomen Pelvis W Contrast  Result Date: 01/20/2019 CLINICAL DATA:  48 year old male with nausea and vomiting. EXAM: CT ABDOMEN  AND PELVIS WITH CONTRAST TECHNIQUE: Multidetector CT imaging of the abdomen and pelvis was performed using the standard protocol following bolus administration of intravenous contrast. CONTRAST:  OMNIPAQUE IOHEXOL 300 MG/ML  SOLN COMPARISON:  CT of the abdomen pelvis dated 11/29/2018 FINDINGS: Lower chest: The visualized lung bases are clear. No intra-abdominal free air or free fluid. Hepatobiliary: There is diffuse fatty infiltration of the liver. No intrahepatic biliary ductal dilatation. The gallbladder is unremarkable. Pancreas: Unremarkable. No pancreatic ductal dilatation or surrounding inflammatory changes. Spleen: Normal in size without focal abnormality. Adrenals/Urinary Tract: The adrenal glands are unremarkable. There is no hydronephrosis on either side. There is symmetric enhancement and excretion of contrast by both kidneys. The visualized ureters and urinary bladder appear unremarkable. Stomach/Bowel: There is no bowel obstruction or active inflammation. The appendix is normal. Vascular/Lymphatic: The abdominal aorta and IVC are unremarkable. No portal venous gas. There is no adenopathy. Reproductive: The prostate and seminal vesicles are grossly unremarkable. No pelvic mass. Other: None Musculoskeletal: No acute or significant osseous findings. IMPRESSION: 1. No acute intra-abdominal or pelvic pathology. No bowel obstruction or active inflammation. Normal appendix. 2. Fatty liver. Electronically Signed   By: Elgie Collard M.D.   On: 01/20/2019 03:27    ____________________________________________   PROCEDURES   Procedure(s) performed (including Critical Care):  Procedures   ____________________________________________   INITIAL IMPRESSION / MDM / ASSESSMENT AND PLAN / ED COURSE  As part of my medical decision making, I reviewed the following data within the electronic MEDICAL RECORD NUMBER Nursing notes reviewed and incorporated, Labs reviewed , EKG interpreted , Old chart  reviewed, Discussed with admitting physician, reviewed [place] controlled substance database, and reviewed Notes from prior ED visits   Differential diagnosis includes, but is not limited to, alcoholic pancreatitis, hepatic cirrhosis, biliary colic, less likely abdominal aortic aneurysm.  I saw the patient a week ago with similar but milder symptoms.  He continues to drink.  At the time I saw him he also told me that he drank "a little bit" and his alcohol level was about 250.  When I confronted him about this tonight he became angry and said that he is tired of his wife and that he knows he needs to stop drinking.  He then became almost tearful and remorseful.  I believe he is still intoxicated at this time although he has been waiting 5 hours for an exam bed and unfortunately we do not have a red top from triage in order to check his alcohol level then.  However I am adding on some additional blood work including ethanol  level, magnesium level, and protime-INR.  I am also giving him 1 L of normal saline and droperidol 2.5 mg IV which I think will help for his chronic abdominal pain as well as the nausea and vomiting.  I will check a CT scan since he has not had any recent imaging and his symptoms are worse and I wanted make sure he does not have any complications of pancreatitis visible on the CT.  I will then reassess and see how he feels and if he is willing and able to go home with outpatient resources for alcohol detox or if he is going to require admission for symptomatic acute on chronic pancreatitis.  Of note, his QTC is appropriate for droperidol administration.      Clinical Course as of Jan 20 740  Tue Jan 20, 2019  1610 The patient's alcohol level of 250 is notable because it was obtained about 5 hours after his arrival in the emergency department.  This suggest that his initial alcohol level could have been near 400 based on a typical clearance rate of 25 per hour.  Alcohol, Ethyl  (B)(!): 250 [CF]  0343 No acute abnormality identified on CT abdomen pelvis  CT ABDOMEN PELVIS W CONTRAST [CF]  0344 Normal coags  Protime-INR [CF]  0411 Magnesium: 2.2 [CF]  0508 Patient reports that he is still having severe pain and inability to tolerate anything by mouth.  Given his history I think he is having a recurrence of his alcoholic pancreatitis which can be present in spite of a normal lipase and the lack of radiographic findings.  I have ordered morphine 4 mg IV, n.p.o. status, Covid swab for screening, CIWA, and I will contact the hospitalist for admission.   [CF]    Clinical Course User Index [CF] Loleta Rose, MD     ____________________________________________  FINAL CLINICAL IMPRESSION(S) / ED DIAGNOSES  Final diagnoses:  Acute on chronic pancreatitis (HCC)  Alcohol abuse  Alcoholic intoxication with complication (HCC)  Intractable nausea and vomiting     MEDICATIONS GIVEN DURING THIS VISIT:  Medications  sodium chloride flush (NS) 0.9 % injection 3 mL (3 mLs Intravenous Not Given 01/20/19 0253)  0.9 %  sodium chloride infusion (has no administration in time range)  amLODipine (NORVASC) tablet 10 mg (has no administration in time range)  hydrOXYzine (ATARAX/VISTARIL) tablet 25 mg (has no administration in time range)  sucralfate (CARAFATE) tablet 1 g (has no administration in time range)  0.9 %  sodium chloride infusion (has no administration in time range)  acetaminophen (TYLENOL) tablet 650 mg (has no administration in time range)    Or  acetaminophen (TYLENOL) suppository 650 mg (has no administration in time range)  traZODone (DESYREL) tablet 25 mg (has no administration in time range)  ondansetron (ZOFRAN) tablet 4 mg (has no administration in time range)    Or  ondansetron (ZOFRAN) injection 4 mg (has no administration in time range)  LORazepam (ATIVAN) injection 1 mg (has no administration in time range)  pantoprazole (PROTONIX) injection 40 mg  (has no administration in time range)  sodium chloride 0.9 % bolus 1,000 mL (0 mLs Intravenous Stopped 01/20/19 0537)  droperidol (INAPSINE) 2.5 MG/ML injection 2.5 mg (2.5 mg Intravenous Given 01/20/19 0400)  iohexol (OMNIPAQUE) 300 MG/ML solution 100 mL (100 mLs Intravenous Contrast Given 01/20/19 0315)  thiamine (B-1) injection 100 mg (100 mg Intravenous Given 01/20/19 0403)  morphine 4 MG/ML injection 4 mg (4 mg Intravenous Given 01/20/19 0458)  sodium chloride  0.9 % 1,000 mL with thiamine 100 mg, folic acid 1 mg, multivitamins adult 10 mL infusion ( Intravenous New Bag/Given 01/20/19 0701)     ED Discharge Orders    None      *Please note:  Kristopher [name] was evaluated in Emergency Department on 01/20/2019 for the symptoms described in the history of present illness. He was evaluated in the context of the global COVID-19 pandemic, which necessitated consideration that the patient might be at risk for infection with the SARS-CoV-2 virus that causes COVID-19. Institutional protocols and algorithms that pertain to the evaluation of patients at risk for COVID-19 are in a state of rapid change based on information released by regulatory bodies including the CDC and federal and state organizations. These policies and algorithms were followed during the patient's care in the ED.  Some ED evaluations and interventions may be delayed as a result of limited staffing during the pandemic.*  Note:  This document was prepared using Dragon voice recognition software and may include unintentional dictation errors.   Loleta Rose, MD 01/20/19 [phone]

## 2019-01-20 NOTE — ED Notes (Signed)
Spoke to pt's wife on phone and provided update.

## 2019-01-20 NOTE — ED Notes (Signed)
Pt placed on 2 L [place] while asleep, states has been told he has a history of sleep apnea. Snoring noted. Equal rise and fall of chest noted. arousal to voice.

## 2019-01-20 NOTE — Progress Notes (Signed)
[facility] [place] [facility]                                                                                                                                                                                  Patient Demographics   Kristopher [name], is a 48 y.o. male, DOB - 06/23/1970, ZOX:[id number]  Admit date - 01/20/2019   Admitting Physician No admitting provider for patient encounter.  Outpatient Primary MD for the patient is Patient, No Pcp Per   LOS - 0  Subjective: Patient admitted with abdominal pain continues to complain of abdominal pain.  Also complains of nausea.    Review of Systems:   CONSTITUTIONAL: No documented fever. No fatigue, weakness. No weight gain, no weight loss.  EYES: No blurry or double vision.  ENT: No tinnitus. No postnasal drip. No redness of the oropharynx.  RESPIRATORY: No cough, no wheeze, no hemoptysis. No dyspnea.  CARDIOVASCULAR: No chest pain. No orthopnea. No palpitations. No syncope.  GASTROINTESTINAL: +nausea, + vomiting or diarrhea. + abdominal pain. No melena or hematochezia.  GENITOURINARY: No dysuria or hematuria.  ENDOCRINE: No polyuria or nocturia. No heat or cold intolerance.  HEMATOLOGY: No anemia. No bruising. No bleeding.  INTEGUMENTARY: No rashes. No lesions.  MUSCULOSKELETAL: No arthritis. No swelling. No gout.  NEUROLOGIC: No numbness, tingling, or ataxia. No seizure-type activity.  PSYCHIATRIC: No anxiety. No insomnia. No ADD.    Vitals:   Vitals:   01/20/19 1100 01/20/19 1119 01/20/19 1130 01/20/19 1254  BP:  (!) 166/111  140/86  Pulse: 86 96 94 92  Resp:    16  Temp:      TempSrc:      SpO2: 97%  97% 95%  Weight:      Height:        Wt Readings from Last 3 Encounters:  01/19/19 90.7 kg  01/13/19 99.8 kg  01/07/19 108.6 kg    No intake or output data in the 24 hours ending 01/20/19 1340  Physical Exam:   GENERAL: Pleasant-appearing in no apparent distress.  HEAD, EYES, EARS, NOSE  AND THROAT: Atraumatic, normocephalic. Extraocular muscles are intact. Pupils equal and reactive to light. Sclerae anicteric. No conjunctival injection. No oro-pharyngeal erythema.  NECK: Supple. There is no jugular venous distention. No bruits, no lymphadenopathy, no thyromegaly.  HEART: Regular rate and rhythm,. No murmurs, no rubs, no clicks.  LUNGS: Clear to auscultation bilaterally. No rales or rhonchi. No wheezes.  ABDOMEN: Soft, flat, epigastric tenderness r, nondistended. Has good bowel sounds. No hepatosplenomegaly appreciated.  EXTREMITIES: No evidence of any cyanosis, clubbing, or peripheral edema.  +2 pedal and radial pulses bilaterally.  NEUROLOGIC: The patient is  alert, awake, and oriented x3 with no focal motor or sensory deficits appreciated bilaterally.  SKIN: Moist and warm with no rashes appreciated.  Psych: Not anxious, depressed LN: No inguinal LN enlargement    Antibiotics   Anti-infectives (From admission, onward)   None      Medications   Scheduled Meds: . amLODipine  10 mg Oral Daily  . pantoprazole (PROTONIX) IV  40 mg Intravenous Q12H  . sodium chloride flush  3 mL Intravenous Once   Continuous Infusions: . sodium chloride     PRN Meds:.acetaminophen **OR** acetaminophen, hydrOXYzine, LORazepam, morphine injection, ondansetron **OR** ondansetron (ZOFRAN) IV, oxyCODONE-acetaminophen, sucralfate, traZODone   Data Review:   Micro Results No results found for this or any previous visit (from the past 240 hour(s)).  Radiology Reports Ct Abdomen Pelvis W Contrast  Result Date: 01/20/2019 CLINICAL DATA:  48 year old male with nausea and vomiting. EXAM: CT ABDOMEN AND PELVIS WITH CONTRAST TECHNIQUE: Multidetector CT imaging of the abdomen and pelvis was performed using the standard protocol following bolus administration of intravenous contrast. CONTRAST:  OMNIPAQUE IOHEXOL 300 MG/ML  SOLN COMPARISON:  CT of the abdomen pelvis dated 11/29/2018  FINDINGS: Lower chest: The visualized lung bases are clear. No intra-abdominal free air or free fluid. Hepatobiliary: There is diffuse fatty infiltration of the liver. No intrahepatic biliary ductal dilatation. The gallbladder is unremarkable. Pancreas: Unremarkable. No pancreatic ductal dilatation or surrounding inflammatory changes. Spleen: Normal in size without focal abnormality. Adrenals/Urinary Tract: The adrenal glands are unremarkable. There is no hydronephrosis on either side. There is symmetric enhancement and excretion of contrast by both kidneys. The visualized ureters and urinary bladder appear unremarkable. Stomach/Bowel: There is no bowel obstruction or active inflammation. The appendix is normal. Vascular/Lymphatic: The abdominal aorta and IVC are unremarkable. No portal venous gas. There is no adenopathy. Reproductive: The prostate and seminal vesicles are grossly unremarkable. No pelvic mass. Other: None Musculoskeletal: No acute or significant osseous findings. IMPRESSION: 1. No acute intra-abdominal or pelvic pathology. No bowel obstruction or active inflammation. Normal appendix. 2. Fatty liver. Electronically Signed   By: Elgie Collard M.D.   On: 01/20/2019 03:27     CBC Recent Labs  Lab 01/13/19 2141 01/19/19 2215  WBC 6.1 8.5  HGB 17.2* 16.9  HCT 48.8 47.8  PLT 208 254  MCV 91.7 91.4  MCH 32.3 32.3  MCHC 35.2 35.4  RDW 13.0 13.4    Chemistries  Recent Labs  Lab 01/13/19 2141 01/19/19 2215 01/20/19 0308  NA 138 136  --   K 3.6 3.8  --   CL 104 99  --   CO2 20* 24  --   GLUCOSE 172* 139*  --   BUN 7 <5*  --   CREATININE 0.77 0.80  --   CALCIUM 9.1 9.1  --   MG  --   --  2.2  AST 71* 132*  --   ALT 101* 134*  --   ALKPHOS 57 73  --   BILITOT 0.4 1.0  --    ------------------------------------------------------------------------------------------------------------------ estimated creatinine clearance is 131.6 mL/min (by C-G formula based on SCr of 0.8  mg/dL). ------------------------------------------------------------------------------------------------------------------ No results for input(s): HGBA1C in the last 72 hours. ------------------------------------------------------------------------------------------------------------------ No results for input(s): CHOL, HDL, LDLCALC, TRIG, CHOLHDL, LDLDIRECT in the last 72 hours. ------------------------------------------------------------------------------------------------------------------ No results for input(s): TSH, T4TOTAL, T3FREE, THYROIDAB in the last 72 hours.  Invalid input(s): FREET3 ------------------------------------------------------------------------------------------------------------------ No results for input(s): VITAMINB12, FOLATE, FERRITIN, TIBC, IRON, RETICCTPCT in the last 72 hours.  Coagulation profile Recent Labs  Lab 01/20/19 0308  INR 0.9    No results for input(s): DDIMER in the last 72 hours.  Cardiac Enzymes No results for input(s): CKMB, TROPONINI, MYOGLOBIN in the last 168 hours.  Invalid input(s): CK ------------------------------------------------------------------------------------------------------------------ Invalid input(s): [place]   1.  Abdominal pain likely due to Acute gastritis due to alcohol CT of the abdomen is negative lipase is normal Continue Protonix IV twice daily Continue Carafate If no improvement consider GI input  2.  Alcohol abuse with current alcohol intoxication.    Continue CIWA protocol   3.  Hypertension.  We will continue amlodipine.  Use as needed pain meds  4.  nicotine abuse smoking cessation provided 4 min spent nicotie patch will be started      Code Status Orders  (From admission, onward)         Start     Ordered   01/20/19 0534  Full code  Continuous     01/20/19 0535        Code Status History    Date Active Date Inactive Code Status Order ID Comments User Context    01/06/2019 2247 01/10/2019 1710 Full Code [id number]  Lang Snow, NP ED   Advance Care Planning Activity           Consults none   DVT Prophylaxisscd's  Lab Results  Component Value Date   PLT 254 01/19/2019     Time Spent in minutes 46min Greater than 50% of time spent in care coordination and counseling patient regarding the condition and plan of care.   Dustin Flock M.D on 01/20/2019 at 1:40 PM  Between 7am to 6pm - Pager - [phone]  After 6pm go to [email or web address] - Proofreader  [employer] [facility]   Office  [phone]

## 2019-01-21 DIAGNOSIS — R109 Unspecified abdominal pain: Secondary | ICD-10-CM | POA: Diagnosis present

## 2019-01-21 LAB — BASIC METABOLIC PANEL
Anion gap: 7 (ref 5–15)
BUN: <5 mg/dL — ABNORMAL LOW (ref 6–20)
CO2: 27 mmol/L (ref 22–32)
Calcium: 8.3 mg/dL — ABNORMAL LOW (ref 8.9–10.3)
Chloride: 104 mmol/L (ref 98–111)
Creatinine, Ser: 0.73 mg/dL (ref 0.61–1.24)
GFR calc Af Amer: >60 mL/min (ref >60)
GFR calc non Af Amer: >60 mL/min (ref >60)
Glucose, Bld: 123 mg/dL — ABNORMAL HIGH (ref 70–99)
Potassium: 3.7 mmol/L (ref 3.5–5.1)
Sodium: 138 mmol/L (ref 135–145)

## 2019-01-21 LAB — CBC
HCT: 45.8 % (ref 39.0–52.0)
Hemoglobin: 15.4 g/dL (ref 13.0–17.0)
MCH: 31.6 pg (ref 26.0–34.0)
MCHC: 33.6 g/dL (ref 30.0–36.0)
MCV: 93.9 fL (ref 80.0–100.0)
Platelets: 224 10*3/uL (ref 150–400)
RBC: 4.88 MIL/uL (ref 4.22–5.81)
RDW: 13.2 % (ref 11.5–15.5)
WBC: 6.5 10*3/uL (ref 4.0–10.5)
nRBC: 0 % (ref 0.0–0.2)

## 2019-01-21 MED ORDER — PANTOPRAZOLE SODIUM 40 MG PO TBEC
40.0000 mg | DELAYED_RELEASE_TABLET | Freq: Two times a day (BID) | ORAL | Status: DC
  Administered 2019-01-21: 40 mg via ORAL
  Filled 2019-01-21: qty 1

## 2019-01-21 MED ORDER — ENOXAPARIN SODIUM 40 MG/0.4ML ~~LOC~~ SOLN
40.0000 mg | SUBCUTANEOUS | Status: DC
  Administered 2019-01-21: 40 mg via SUBCUTANEOUS
  Filled 2019-01-21: qty 0.4

## 2019-01-21 MED ORDER — OXYCODONE-ACETAMINOPHEN 5-325 MG PO TABS
1.0000 | ORAL_TABLET | ORAL | Status: DC | PRN
  Administered 2019-01-21 – 2019-01-22 (×4): 2 via ORAL
  Filled 2019-01-21 (×4): qty 2

## 2019-01-21 NOTE — Progress Notes (Signed)
[employer] - [place] at [facility]                                                                                                                                                                                  Patient Demographics   Kristopher [name], is a 48 y.o. male, DOB - 02-Feb-1971, WUJ:[id number]  Admit date - 01/20/2019   Admitting Physician Hannah Beat, MD  Outpatient Primary MD for the patient is Patient, No Pcp Per   LOS - 0  Still has significant abd pain. Nausea. No vomiting today    Review of Systems:   CONSTITUTIONAL: No documented fever. No fatigue, weakness. No weight gain, no weight loss.  EYES: No blurry or double vision.  ENT: No tinnitus. No postnasal drip. No redness of the oropharynx.  RESPIRATORY: No cough, no wheeze, no hemoptysis. No dyspnea.  CARDIOVASCULAR: No chest pain. No orthopnea. No palpitations. No syncope.  GASTROINTESTINAL: +nausea, + vomiting or diarrhea. + abdominal pain. No melena or hematochezia.  GENITOURINARY: No dysuria or hematuria.  ENDOCRINE: No polyuria or nocturia. No heat or cold intolerance.  HEMATOLOGY: No anemia. No bruising. No bleeding.  INTEGUMENTARY: No rashes. No lesions.  MUSCULOSKELETAL: No arthritis. No swelling. No gout.  NEUROLOGIC: No numbness, tingling, or ataxia. No seizure-type activity.  PSYCHIATRIC: No anxiety. No insomnia. No ADD.    Vitals:   Vitals:   01/20/19 1647 01/20/19 1948 01/21/19 0450 01/21/19 0809  BP: (!) 179/113 (!) 147/96 (!) 137/95 (!) 145/95  Pulse: 81 82 83 81  Resp:  Temp: 97.9 F (36.6 C) 98.2 F (36.8 C) 97.8 F (36.6 C) 97.6 F (36.4 C)  TempSrc: Oral Oral Oral Oral  SpO2: 97% 93% 97% 97%  Weight:      Height:        Wt Readings from Last 3 Encounters:  01/19/19 90.7 kg  01/13/19 99.8 kg  01/07/19 108.6 kg     Intake/Output Summary (Last 24 hours) at 01/21/2019 1125 Last data filed at 01/21/2019 0300 Gross per 24 hour  Intake 665.03 ml   Output -  Net 665.03 ml    Physical Exam:   GENERAL: Pleasant-appearing in no apparent distress.  HEAD, EYES, EARS, NOSE AND THROAT: Atraumatic, normocephalic. Extraocular muscles are intact. Pupils equal and reactive to light. Sclerae anicteric. No conjunctival injection. No oro-pharyngeal erythema.  NECK: Supple. There is no jugular venous distention. No bruits, no lymphadenopathy, no thyromegaly.  HEART: Regular rate and rhythm,. No murmurs, no rubs, no clicks.  LUNGS: Clear to auscultation bilaterally. No rales or rhonchi. No wheezes.  ABDOMEN: Soft, flat, epigastric tenderness r, nondistended. Has good bowel sounds. No  hepatosplenomegaly appreciated.  EXTREMITIES: No evidence of any cyanosis, clubbing, or peripheral edema.  +2 pedal and radial pulses bilaterally.  NEUROLOGIC: The patient is alert, awake, and oriented x3 with no focal motor or sensory deficits appreciated bilaterally.  SKIN: Moist and warm with no rashes appreciated.  Psych: Not anxious, depressed LN: No inguinal LN enlargement    Antibiotics   Anti-infectives (From admission, onward)   None      Medications   Scheduled Meds: . amLODipine  10 mg Oral Daily  . folic acid  1 mg Oral Daily  . multivitamin with minerals  1 tablet Oral Daily  . nicotine  21 mg Transdermal Daily  . pantoprazole  40 mg Oral BID AC  . sodium chloride flush  3 mL Intravenous Once  . thiamine  100 mg Oral Daily   Continuous Infusions: . sodium chloride 100 mL/hr at 01/21/19 0645   PRN Meds:.acetaminophen **OR** acetaminophen, hydrOXYzine, LORazepam **OR** LORazepam, ondansetron **OR** ondansetron (ZOFRAN) IV, oxyCODONE-acetaminophen, sucralfate, traZODone   Data Review:   Micro Results Recent Results (from the past 240 hour(s))  SARS CORONAVIRUS 2 (TAT 6-24 HRS) Nasopharyngeal Nasopharyngeal Swab     Status: None   Collection Time: 01/20/19  5:14 AM   Specimen: Nasopharyngeal Swab  Result Value Ref Range Status   SARS  Coronavirus 2 NEGATIVE NEGATIVE Final    Comment: (NOTE) SARS-CoV-2 target nucleic acids are NOT DETECTED. The SARS-CoV-2 RNA is generally detectable in upper and lower respiratory specimens during the acute phase of infection. Negative results do not preclude SARS-CoV-2 infection, do not rule out co-infections with other pathogens, and should not be used as the sole basis for treatment or other patient management decisions. Negative results must be combined with clinical observations, patient history, and epidemiological information. The expected result is Negative. Fact Sheet for Patients: HairSlick.nohttps://www.fda.gov/media/138098/download Fact Sheet for Healthcare Providers: quierodirigir.comhttps://www.fda.gov/media/138095/download This test is not yet approved or cleared by the [place] FDA and  has been authorized for detection and/or diagnosis of SARS-CoV-2 by FDA under an Emergency Use Authorization (EUA). This EUA will remain  in effect (meaning this test can be used) for the duration of the COVID-19 declaration under Section 56 4(b)(1) of the Act, 21 U.S.C. section 360bbb-3(b)(1), unless the authorization is terminated or revoked sooner. Performed at [facility], [address] [place], [place] [address]     Radiology Reports Ct Abdomen Pelvis W Contrast  Result Date: 01/20/2019 CLINICAL DATA:  48 year old male with nausea and vomiting. EXAM: CT ABDOMEN AND PELVIS WITH CONTRAST TECHNIQUE: Multidetector CT imaging of the abdomen and pelvis was performed using the standard protocol following bolus administration of intravenous contrast. CONTRAST:  100mL OMNIPAQUE IOHEXOL 300 MG/ML  SOLN COMPARISON:  CT of the abdomen pelvis dated 11/29/2018 FINDINGS: Lower chest: The visualized lung bases are clear. No intra-abdominal free air or free fluid. Hepatobiliary: There is diffuse fatty infiltration of the liver. No intrahepatic biliary ductal dilatation. The gallbladder is unremarkable. Pancreas:  Unremarkable. No pancreatic ductal dilatation or surrounding inflammatory changes. Spleen: Normal in size without focal abnormality. Adrenals/Urinary Tract: The adrenal glands are unremarkable. There is no hydronephrosis on either side. There is symmetric enhancement and excretion of contrast by both kidneys. The visualized ureters and urinary bladder appear unremarkable. Stomach/Bowel: There is no bowel obstruction or active inflammation. The appendix is normal. Vascular/Lymphatic: The abdominal aorta and IVC are unremarkable. No portal venous gas. There is no adenopathy. Reproductive: The prostate and seminal vesicles are grossly unremarkable. No pelvic  mass. Other: None Musculoskeletal: No acute or significant osseous findings. IMPRESSION: 1. No acute intra-abdominal or pelvic pathology. No bowel obstruction or active inflammation. Normal appendix. 2. Fatty liver. Electronically Signed   By: Elgie Collard M.D.   On: 01/20/2019 03:27     CBC Recent Labs  Lab 01/19/19 2215 01/20/19 1746 01/21/19 0455  WBC 8.5 7.3 6.5  HGB 16.9 16.6 15.4  HCT 47.8 48.2 45.8  PLT 254 253 224  MCV 91.4 93.6 93.9  MCH 32.3 32.2 31.6  MCHC 35.4 34.4 33.6  RDW 13.4 13.5 13.2    Chemistries  Recent Labs  Lab 01/19/19 2215 01/20/19 0308 01/20/19 1746 01/21/19 0455  NA 136  --  138 138  K 3.8  --  4.1 3.7  CL 99  --  103 104  CO2 24  --  25 27  GLUCOSE 139*  --  139* 123*  BUN <5*  --  <5* <5*  CREATININE 0.80  --  0.80 0.73  CALCIUM 9.1  --  8.5* 8.3*  MG  --  2.2 1.6*  --   AST 132*  --  113*  --   ALT 134*  --  123*  --   ALKPHOS 73  --  71  --   BILITOT 1.0  --  1.2  --    ------------------------------------------------------------------------------------------------------------------ estimated creatinine clearance is 130.2 mL/min (by C-G formula based on SCr of 0.73 mg/dL). ------------------------------------------------------------------------------------------------------------------ No  results for input(s): HGBA1C in the last 72 hours. ------------------------------------------------------------------------------------------------------------------ No results for input(s): CHOL, HDL, LDLCALC, TRIG, CHOLHDL, LDLDIRECT in the last 72 hours. ------------------------------------------------------------------------------------------------------------------ No results for input(s): TSH, T4TOTAL, T3FREE, THYROIDAB in the last 72 hours.  Invalid input(s): FREET3 ------------------------------------------------------------------------------------------------------------------ No results for input(s): VITAMINB12, FOLATE, FERRITIN, TIBC, IRON, RETICCTPCT in the last 72 hours.  Coagulation profile Recent Labs  Lab 01/20/19 0308  INR 0.9    No results for input(s): DDIMER in the last 72 hours.  Cardiac Enzymes No results for input(s): CKMB, TROPONINI, MYOGLOBIN in the last 168 hours.  Invalid input(s): CK ------------------------------------------------------------------------------------------------------------------ Invalid input(s): POCBNP    Assessment & Plan   1.  Abdominal pain likely due to Acute gastritis due to alcohol CT of the abdomen is negative. lipase is normal Continue Protonix  twice daily Continue Carafate Will need further inpatient treatment due to significant pain and nausea.  2.  Alcohol abuse with current alcohol intoxication.   Continue CIWA protocol  Mild withdrawal  3.  Hypertension.  We will continue amlodipine.  Use as needed pain meds  4.  Nicotine abuse smoking  counseled on admission      Code Status Orders  (From admission, onward)         Start     Ordered   01/20/19 0534  Full code  Continuous     01/20/19 0535        Code Status History    Date Active Date Inactive Code Status Order ID Comments User Context   01/06/2019 2247 01/10/2019 1710 Full Code [id number]  Jimmye Norman, NP ED   Advance Care  Planning Activity      Consults none   DVT Prophylaxis Lovenox  Lab Results  Component Value Date   PLT 224 01/21/2019     Time Spent in minutes Greater than 50% of time spent in care coordination and counseling patient regarding the condition and plan of care.   Molinda Bailiff Albertia Carvin M.D on 01/21/2019 at 11:25 AM  Between 7am to  6pm - Pager - [phone]  After 6pm go to [email or web address] - Proofreader  [employer]   Office  [phone]

## 2019-01-22 MED ORDER — CHLORDIAZEPOXIDE HCL 25 MG PO CAPS
25.0000 mg | ORAL_CAPSULE | Freq: Three times a day (TID) | ORAL | Status: DC
  Administered 2019-01-22: 25 mg via ORAL
  Filled 2019-01-22: qty 1

## 2019-01-22 NOTE — Progress Notes (Signed)
[place] at [facility]                                                                                                                                                                                  Patient Demographics   Kristopher [name], is a 48 y.o. male, DOB - 14-Mar-1971, TFT:[id number]  Admit date - 01/20/2019   Admitting Physician Christel Mormon, MD  Outpatient Primary MD for the patient is Patient, No Pcp Per   LOS - 1  Abd pain slowly improving but patient in withdrawals. Alert and oriented but tachycardic. Restless    Review of Systems:   CONSTITUTIONAL: No documented fever. No fatigue, weakness. No weight gain, no weight loss.  EYES: No blurry or double vision.  ENT: No tinnitus. No postnasal drip. No redness of the oropharynx.  RESPIRATORY: No cough, no wheeze, no hemoptysis. No dyspnea.  CARDIOVASCULAR: No chest pain. No orthopnea. No palpitations. No syncope.  GASTROINTESTINAL: +nausea, + vomiting or diarrhea. + abdominal pain. No melena or hematochezia.  GENITOURINARY: No dysuria or hematuria.  ENDOCRINE: No polyuria or nocturia. No heat or cold intolerance.  HEMATOLOGY: No anemia. No bruising. No bleeding.  INTEGUMENTARY: No rashes. No lesions.  MUSCULOSKELETAL: No arthritis. No swelling. No gout.  NEUROLOGIC: No numbness, tingling, or ataxia. No seizure-type activity.  PSYCHIATRIC: No anxiety. No insomnia. No ADD.    Vitals:   Vitals:   01/21/19 0809 01/21/19 1154 01/21/19 2251 01/22/19 0620  BP: (!) 145/95 (!) 136/93 (!) 152/90 (!) 167/103  Pulse: 81 83 81 89  Resp: 18 15 20 20   Temp: 97.6 F (36.4 C) (!) 97.4 F (36.3 C) 98.6 F (37 C) 98.1 F (36.7 C)  TempSrc: Oral Oral Oral Oral  SpO2: 97% 97% 94% 97%  Weight:      Height:        Wt Readings from Last 3 Encounters:  01/19/19 90.7 kg  01/13/19 99.8 kg  01/07/19 108.6 kg     Intake/Output Summary (Last 24 hours) at 01/22/2019 1442 Last data filed at 01/22/2019  0700 Gross per 24 hour  Intake 2661.14 ml  Output -  Net 2661.14 ml    Physical Exam:   GENERAL: Pleasant-appearing in no apparent distress.  HEAD, EYES, EARS, NOSE AND THROAT: Atraumatic, normocephalic. Extraocular muscles are intact. Pupils equal and reactive to light. Sclerae anicteric. No conjunctival injection. No oro-pharyngeal erythema.  NECK: Supple. There is no jugular venous distention. No bruits, no lymphadenopathy, no thyromegaly.  HEART: S1, S2. Tachycardia LUNGS: Clear to auscultation bilaterally. No rales or rhonchi. No wheezes.  ABDOMEN: Soft, flat, epigastric tenderness r, nondistended. Has good bowel sounds. No hepatosplenomegaly appreciated.  EXTREMITIES: No evidence of any cyanosis, clubbing, or peripheral edema.  +2 pedal and radial pulses bilaterally.  NEUROLOGIC: The patient is alert, awake, and oriented x3 with no focal motor or sensory deficits appreciated bilaterally.  SKIN: Moist and warm with no rashes appreciated.  Psych: Anxious LN: No inguinal LN enlargement    Antibiotics   Anti-infectives (From admission, onward)   None      Medications   Scheduled Meds:  Continuous Infusions:  PRN Meds:.   Data Review:   Micro Results Recent Results (from the past 240 hour(s))  SARS CORONAVIRUS 2 (TAT 6-24 HRS) Nasopharyngeal Nasopharyngeal Swab     Status: None   Collection Time: 01/20/19  5:14 AM   Specimen: Nasopharyngeal Swab  Result Value Ref Range Status   SARS Coronavirus 2 NEGATIVE NEGATIVE Final    Comment: (NOTE) SARS-CoV-2 target nucleic acids are NOT DETECTED. The SARS-CoV-2 RNA is generally detectable in upper and lower respiratory specimens during the acute phase of infection. Negative results do not preclude SARS-CoV-2 infection, do not rule out co-infections with other pathogens, and should not be used as the sole basis for treatment or other patient management decisions. Negative results must be combined with clinical  observations, patient history, and epidemiological information. The expected result is Negative. Fact Sheet for Patients: HairSlick.nohttps://www.fda.gov/media/138098/download Fact Sheet for Healthcare Providers: quierodirigir.comhttps://www.fda.gov/media/138095/download This test is not yet approved or cleared by the [place] FDA and  has been authorized for detection and/or diagnosis of SARS-CoV-2 by FDA under an Emergency Use Authorization (EUA). This EUA will remain  in effect (meaning this test can be used) for the duration of the COVID-19 declaration under Section 56 4(b)(1) of the Act, 21 U.S.C. section 360bbb-3(b)(1), unless the authorization is terminated or revoked sooner. Performed at [facility], [address] [place], [place] [address]     Radiology Reports Ct Abdomen Pelvis W Contrast  Result Date: 01/20/2019 CLINICAL DATA:  48 year old male with nausea and vomiting. EXAM: CT ABDOMEN AND PELVIS WITH CONTRAST TECHNIQUE: Multidetector CT imaging of the abdomen and pelvis was performed using the standard protocol following bolus administration of intravenous contrast. CONTRAST:  100mL OMNIPAQUE IOHEXOL 300 MG/ML  SOLN COMPARISON:  CT of the abdomen pelvis dated 11/29/2018 FINDINGS: Lower chest: The visualized lung bases are clear. No intra-abdominal free air or free fluid. Hepatobiliary: There is diffuse fatty infiltration of the liver. No intrahepatic biliary ductal dilatation. The gallbladder is unremarkable. Pancreas: Unremarkable. No pancreatic ductal dilatation or surrounding inflammatory changes. Spleen: Normal in size without focal abnormality. Adrenals/Urinary Tract: The adrenal glands are unremarkable. There is no hydronephrosis on either side. There is symmetric enhancement and excretion of contrast by both kidneys. The visualized ureters and urinary bladder appear unremarkable. Stomach/Bowel: There is no bowel obstruction or active inflammation. The appendix is normal.  Vascular/Lymphatic: The abdominal aorta and IVC are unremarkable. No portal venous gas. There is no adenopathy. Reproductive: The prostate and seminal vesicles are grossly unremarkable. No pelvic mass. Other: None Musculoskeletal: No acute or significant osseous findings. IMPRESSION: 1. No acute intra-abdominal or pelvic pathology. No bowel obstruction or active inflammation. Normal appendix. 2. Fatty liver. Electronically Signed   By: Elgie CollardArash  Radparvar M.D.   On: 01/20/2019 03:27     CBC Recent Labs  Lab 01/19/19 2215 01/20/19 1746 01/21/19 0455  WBC 8.5 7.3 6.5  HGB 16.9 16.6 15.4  HCT 47.8 48.2 45.8  PLT 254 253 224  MCV 91.4 93.6 93.9  MCH 32.3 32.2 31.6  MCHC  35.4 34.4 33.6  RDW 13.4 13.5 13.2    Chemistries  Recent Labs  Lab 01/19/19 2215 01/20/19 0308 01/20/19 1746 01/21/19 0455  NA 136  --  138 138  K 3.8  --  4.1 3.7  CL 99  --  103 104  CO2 24  --  25 27  GLUCOSE 139*  --  139* 123*  BUN <5*  --  <5* <5*  CREATININE 0.80  --  0.80 0.73  CALCIUM 9.1  --  8.5* 8.3*  MG  --  2.2 1.6*  --   AST 132*  --  113*  --   ALT 134*  --  123*  --   ALKPHOS 73  --  71  --   BILITOT 1.0  --  1.2  --    ------------------------------------------------------------------------------------------------------------------ estimated creatinine clearance is 130.2 mL/min (by C-G formula based on SCr of 0.73 mg/dL). ------------------------------------------------------------------------------------------------------------------ No results for input(s): HGBA1C in the last 72 hours. ------------------------------------------------------------------------------------------------------------------ No results for input(s): CHOL, HDL, LDLCALC, TRIG, CHOLHDL, LDLDIRECT in the last 72 hours. ------------------------------------------------------------------------------------------------------------------ No results for input(s): TSH, T4TOTAL, T3FREE, THYROIDAB in the last 72 hours.  Invalid  input(s): FREET3 ------------------------------------------------------------------------------------------------------------------ No results for input(s): VITAMINB12, FOLATE, FERRITIN, TIBC, IRON, RETICCTPCT in the last 72 hours.  Coagulation profile Recent Labs  Lab 01/20/19 0308  INR 0.9    No results for input(s): DDIMER in the last 72 hours.  Cardiac Enzymes No results for input(s): CKMB, TROPONINI, MYOGLOBIN in the last 168 hours.  Invalid input(s): CK ------------------------------------------------------------------------------------------------------------------ Invalid input(s): POCBNP    Assessment & Plan   1.  Abdominal pain likely due to Acute gastritis due to alcohol CT of the abdomen is negative. lipase is normal Continue Protonix  twice daily Continue Carafate Will reduce pain meds today  2.  Alcohol withdrawal and high risk of DTs Will add STAT dose of PO librium. IV ativan now.Tachycrdic. Alert and oriented at this time.  3.  Hypertension.  We will continue amlodipine.   4.  Nicotine abuse smoking  counseled on admission      Code Status Orders  (From admission, onward)         Start     Ordered   01/20/19 0534  Full code  Continuous     01/20/19 0535        Code Status History    Date Active Date Inactive Code Status Order ID Comments User Context   01/06/2019 2247 01/10/2019 1710 Full Code [id number]  Jimmye Norman, NP ED   Advance Care Planning Activity      Consults none   DVT Prophylaxis Lovenox  Lab Results  Component Value Date   PLT 224 01/21/2019     Time CRITICAL CARE time Spent in minutes    Molinda Bailiff Jaspreet Hollings M.D on 01/22/2019 at 2:42 PM  Between 7am to 6pm - Pager - [phone]  After 6pm go to [email or web address] - Social research officer, government  [employer]   Office  [phone]

## 2019-01-22 NOTE — Progress Notes (Signed)
Patient pacing the hallway going into other people's room trying to find his way out. He was yelling in his room, at the nurses desk, and walking in the hallway cussing. He said he was going to leave and there was nothing we could do to make him stay. Patient encouraged to stay and a sitter placed with him but his anxiety and agitation just increased. Security called. Patient escort out by 2 staff.

## 2019-01-22 NOTE — Progress Notes (Signed)
Patient pacing and talking about leaving; encouraged to stay and let IV team replace IV for medications and hydration; voiced agreement. CIWA every hour overnight. Barbaraann Faster, RN 7:10 AM 01/22/2019

## 2019-01-24 ENCOUNTER — Emergency Department: Payer: Self-pay

## 2019-01-24 ENCOUNTER — Other Ambulatory Visit: Payer: Self-pay

## 2019-01-24 ENCOUNTER — Emergency Department
Admission: EM | Admit: 2019-01-24 | Discharge: 2019-01-24 | Disposition: A | Discharge status: Home / Self Care | Payer: Self-pay | Attending: Emergency Medicine | Admitting: Emergency Medicine

## 2019-01-24 DIAGNOSIS — R1013 Epigastric pain: Secondary | ICD-10-CM | POA: Insufficient documentation

## 2019-01-24 DIAGNOSIS — I10 Essential (primary) hypertension: Secondary | ICD-10-CM | POA: Insufficient documentation

## 2019-01-24 DIAGNOSIS — F1210 Cannabis abuse, uncomplicated: Secondary | ICD-10-CM | POA: Insufficient documentation

## 2019-01-24 DIAGNOSIS — K2920 Alcoholic gastritis without bleeding: Secondary | ICD-10-CM | POA: Insufficient documentation

## 2019-01-24 DIAGNOSIS — F17210 Nicotine dependence, cigarettes, uncomplicated: Secondary | ICD-10-CM | POA: Insufficient documentation

## 2019-01-24 DIAGNOSIS — Z79899 Other long term (current) drug therapy: Secondary | ICD-10-CM | POA: Insufficient documentation

## 2019-01-24 LAB — CBC
HCT: 44.3 % (ref 39.0–52.0)
Hemoglobin: 15.5 g/dL (ref 13.0–17.0)
MCH: 32.4 pg (ref 26.0–34.0)
MCHC: 35 g/dL (ref 30.0–36.0)
MCV: 92.5 fL (ref 80.0–100.0)
Platelets: 230 10*3/uL (ref 150–400)
RBC: 4.79 MIL/uL (ref 4.22–5.81)
RDW: 13.2 % (ref 11.5–15.5)
WBC: 6.4 10*3/uL (ref 4.0–10.5)
nRBC: 0 % (ref 0.0–0.2)

## 2019-01-24 LAB — COMPREHENSIVE METABOLIC PANEL
ALT: 90 U/L — ABNORMAL HIGH (ref 0–44)
AST: 95 U/L — ABNORMAL HIGH (ref 15–41)
Albumin: 4 g/dL (ref 3.5–5.0)
Alkaline Phosphatase: 61 U/L (ref 38–126)
Anion gap: 15 (ref 5–15)
BUN: 7 mg/dL (ref 6–20)
CO2: 21 mmol/L — ABNORMAL LOW (ref 22–32)
Calcium: 8.8 mg/dL — ABNORMAL LOW (ref 8.9–10.3)
Chloride: 101 mmol/L (ref 98–111)
Creatinine, Ser: 0.93 mg/dL (ref 0.61–1.24)
GFR calc Af Amer: >60 mL/min (ref >60)
GFR calc non Af Amer: >60 mL/min (ref >60)
Glucose, Bld: 184 mg/dL — ABNORMAL HIGH (ref 70–99)
Potassium: 3.1 mmol/L — ABNORMAL LOW (ref 3.5–5.1)
Sodium: 137 mmol/L (ref 135–145)
Total Bilirubin: 0.7 mg/dL (ref 0.3–1.2)
Total Protein: 7.2 g/dL (ref 6.5–8.1)

## 2019-01-24 LAB — TYPE AND SCREEN
ABO/RH(D): A NEG
Antibody Screen: NEGATIVE

## 2019-01-24 LAB — PROTIME-INR
INR: 0.9 (ref 0.8–1.2)
Prothrombin Time: 12.3 seconds (ref 11.4–15.2)

## 2019-01-24 LAB — ETHANOL: Alcohol, Ethyl (B): 290 mg/dL — ABNORMAL HIGH (ref <10)

## 2019-01-24 LAB — LIPASE, BLOOD: Lipase: 48 U/L (ref 11–51)

## 2019-01-24 MED ORDER — PANTOPRAZOLE SODIUM 40 MG IV SOLR
40.0000 mg | Freq: Once | INTRAVENOUS | Status: AC
  Administered 2019-01-24: 09:00:00 40 mg via INTRAVENOUS
  Filled 2019-01-24: qty 40

## 2019-01-24 MED ORDER — ALUM & MAG HYDROXIDE-SIMETH 200-200-20 MG/5ML PO SUSP
30.0000 mL | Freq: Once | ORAL | Status: AC
  Administered 2019-01-24: 14:00:00 30 mL via ORAL
  Filled 2019-01-24: qty 30

## 2019-01-24 MED ORDER — DROPERIDOL 2.5 MG/ML IJ SOLN
2.5000 mg | Freq: Once | INTRAMUSCULAR | Status: AC
  Administered 2019-01-24: 09:00:00 2.5 mg via INTRAVENOUS
  Filled 2019-01-24: qty 2

## 2019-01-24 MED ORDER — SODIUM CHLORIDE 0.9 % IV BOLUS
1000.0000 mL | Freq: Once | INTRAVENOUS | Status: AC
  Administered 2019-01-24: 09:00:00 1000 mL via INTRAVENOUS

## 2019-01-24 MED ORDER — LIDOCAINE VISCOUS HCL 2 % MT SOLN
15.0000 mL | Freq: Once | OROMUCOSAL | Status: AC
  Administered 2019-01-24: 14:00:00 15 mL via ORAL
  Filled 2019-01-24: qty 15

## 2019-01-24 NOTE — ED Notes (Signed)
Pt given gingerale for PO challenge 

## 2019-01-24 NOTE — Discharge Summary (Signed)
Please see detailed H&P.  Patient left AGAINST MEDICAL ADVICE without any medications.  Abdominal pain secondary to acute gastritis with alcohol abuse Alcohol withdrawals Hypertension Tobacco abuse  Patient was treated on the medical floor with IV pain medications along with oral pain medications.  IV fluids.  CIWA protocol.  Patient continued to improve but on 01/22/2019 decided to leave Kristopher [name].  Did not wait for any prescriptions.  Advised to return if any worsening.

## 2019-01-24 NOTE — ED Provider Notes (Signed)
[facility] Emergency Department Provider Note  ____________________________________________   First MD Initiated Contact with Patient 01/24/19 [phone]     (approximate)  I have reviewed the triage vital signs and the nursing notes.   HISTORY  Chief Complaint Hemoptysis    HPI Kristopher [name] is a 48 y.o. male  With PMHx alcohol dependence, HTN, pancreatitis, recent admission for same here with nausea, vomiting, "shaking" and reported hematemesis. On arrival here, pt appears intoxicated, belligerent limiting history. He tells me he hurts "all over" and is requesting for something "strong" to help with his pain and shaking. He admits to drinking just prior to arrival. He reports he has vomited nothing but blood for days, since he left. He denies fevers. He reports severe epigastric abd pain. Remainder of history limited 2/2 intoxication.  Level 5 caveat invoked as remainder of history, ROS, and physical exam limited due to patient's intoxication.         Past Medical History:  Diagnosis Date  . Alcohol dependence (HCC)   . GERD (gastroesophageal reflux disease)   . Hypertension   . Pancreatitis   . Pancreatitis     Patient Active Problem List   Diagnosis Date Noted  . Abdominal pain 01/21/2019  . Intractable nausea and vomiting 01/20/2019  . Acute alcoholic pancreatitis 01/06/2019    Past Surgical History:  Procedure Laterality Date  . ABDOMINAL SURGERY      Prior to Admission medications   Medication Sig Start Date End Date Taking? Authorizing Provider  amLODipine (NORVASC) 10 MG tablet Take 10 mg by mouth daily.    [provider]  famotidine (PEPCID) 20 MG tablet Take 1 tablet (20 mg total) by mouth 2 (two) times daily. Patient not taking: Reported on 01/06/2019 10/29/18   Irean Hong, MD  hydrOXYzine (ATARAX/VISTARIL) 25 MG tablet Take 25 mg by mouth every 4 (four) hours as needed.    [provider]  ondansetron  (ZOFRAN) 4 MG tablet Take 1 tablet (4 mg total) by mouth daily as needed for nausea or vomiting. Patient not taking: Reported on 01/06/2019 11/29/18 11/29/19  Concha Se, MD  oxyCODONE-acetaminophen (PERCOCET/ROXICET) 5-325 MG tablet Take 1 tablet by mouth every 4 (four) hours as needed for severe pain. Patient not taking: Reported on 01/06/2019 12/19/18   Irean Hong, MD  pantoprazole (PROTONIX) 20 MG tablet Take 1 tablet (20 mg total) by mouth daily. Patient not taking: Reported on 01/06/2019 11/20/18 11/20/19  Willy Eddy, MD  promethazine (PHENERGAN) 12.5 MG tablet Take 1 tablet (12.5 mg total) by mouth every 6 (six) hours as needed. Patient not taking: Reported on 01/06/2019 11/20/18   Willy Eddy, MD  sucralfate (CARAFATE) 1 g tablet Take 1 tablet (1 g total) by mouth 4 (four) times daily as needed (for abdominal discomfort, nausea, and/or vomiting). 01/14/19   Loleta Rose, MD  traMADol (ULTRAM) 50 MG tablet Take 1 tablet (50 mg total) by mouth every 6 (six) hours as needed for severe pain. Patient not taking: Reported on 01/06/2019 11/20/18 11/20/19  Willy Eddy, MD  traMADol (ULTRAM) 50 MG tablet Take 1 tablet (50 mg total) by mouth every 6 (six) hours as needed. Patient not taking: Reported on 01/20/2019 11/30/18 11/30/19  Darci Current, MD    Allergies Patient has no known allergies.  No family history on file.  Social History Social History   Tobacco Use  . Smoking status: Current Every Day Smoker    Packs/day: 1.00  Types: Cigarettes  . Smokeless tobacco: Never Used  Substance Use Topics  . Alcohol use: Yes    Alcohol/week: 168.0 standard drinks    Types: 168 Cans of beer per week  . Drug use: Not Currently    Types: Marijuana    Review of Systems  Review of Systems  Unable to perform ROS: Mental status change  Constitutional: Positive for fatigue.  Gastrointestinal: Positive for abdominal pain, blood in stool, nausea and vomiting.  Neurological:  Positive for weakness.     ____________________________________________  PHYSICAL EXAM:      VITAL SIGNS: ED Triage Vitals  Enc Vitals Group     BP 01/24/19 0813 104/70     Pulse Rate 01/24/19 0810 87     Resp 01/24/19 0810 14     Temp 01/24/19 0810 97.8 F (36.6 C)     Temp Source 01/24/19 0810 Oral     SpO2 --      Weight 01/24/19 0809 200 lb (90.7 kg)     Height 01/24/19 0809  (1.803 m)     Head Circumference --      Peak Flow --      Pain Score 01/24/19 0809 0     Pain Loc --      Pain Edu? --      Excl. in GC? --      Physical Exam Vitals signs and nursing note reviewed.  Constitutional:      Appearance: He is well-developed.     Comments: Disheveled, smells of alcohol  HENT:     Head: Normocephalic and atraumatic.  Eyes:     Conjunctiva/sclera: Conjunctivae normal.  Neck:     Musculoskeletal: Neck supple.  Cardiovascular:     Rate and Rhythm: Normal rate and regular rhythm.     Heart sounds: Normal heart sounds. No murmur. No friction rub.  Pulmonary:     Effort: Pulmonary effort is normal. No respiratory distress.     Breath sounds: Normal breath sounds. No wheezing or rales.  Abdominal:     General: Abdomen is flat. There is no distension.     Palpations: Abdomen is soft.     Tenderness: There is abdominal tenderness (mild, epigastric).     Comments: Surgical scar midline abdomen, no guarding, no rebound  Skin:    General: Skin is warm.     Capillary Refill: Capillary refill takes less than 2 seconds.  Neurological:     Mental Status: He is alert.     Motor: No abnormal muscle tone.     Comments: Appears intoxicated, speech slightly slurred. MAE with 5/5 strength. Volitional tremors noted when examiner in room, resolves when outside.       ____________________________________________   LABS (all labs ordered are listed, but only abnormal results are displayed)  Labs Reviewed  COMPREHENSIVE METABOLIC PANEL - Abnormal; Notable for the  following components:      Result Value   Potassium 3.1 (*)    CO2 21 (*)    Glucose, Bld 184 (*)    Calcium 8.8 (*)    AST 95 (*)    ALT 90 (*)    All other components within normal limits  ETHANOL - Abnormal; Notable for the following components:   Alcohol, Ethyl (B) 290 (*)    All other components within normal limits  CBC  LIPASE, BLOOD  PROTIME-INR  TYPE AND SCREEN    ____________________________________________  EKG: Sinus rhythm, ventricular rate 88.  PR 177, QRS 120, QTc  444.  No acute ST or T-segment changes.  No elevation or depressions. ________________________________________  RADIOLOGY All imaging, including plain films, CT scans, and ultrasounds, independently reviewed by me, and interpretations confirmed via formal radiology reads.  ED MD interpretation:   AAS: Negative  Official radiology report(s): Dg Abdomen Acute W/chest  Result Date: 01/24/2019 CLINICAL DATA:  States nausea, no vomiting noticed at this time but states he vomited blood with EMS. Hx of pancreatitis. Current smoker. EXAM: DG ABDOMEN ACUTE W/ 1V CHEST COMPARISON:  Abdomen and pelvis CT, 01/20/2019. FINDINGS: There is no evidence of dilated bowel loops or free intraperitoneal air. No radiopaque calculi or other significant radiographic abnormality is seen. Heart size and mediastinal contours are within normal limits. Both lungs are clear. IMPRESSION: Negative abdominal radiographs.  No acute cardiopulmonary disease. Electronically Signed   By: Amie Portlandavid  Ormond M.D.   On: 01/24/2019 09:51    ____________________________________________  PROCEDURES   Procedure(s) performed (including Critical Care):  Procedures  ____________________________________________  INITIAL IMPRESSION / MDM / ASSESSMENT AND PLAN / ED COURSE  As part of my medical decision making, I reviewed the following data within the electronic MEDICAL RECORD NUMBER Notes from prior ED visits and [place] Controlled Substance Database       *Judge StallMichael K Steig was evaluated in Emergency Department on 01/24/2019 for the symptoms described in the history of present illness. He was evaluated in the context of the global COVID-19 pandemic, which necessitated consideration that the patient might be at risk for infection with the SARS-CoV-2 virus that causes COVID-19. Institutional protocols and algorithms that pertain to the evaluation of patients at risk for COVID-19 are in a state of rapid change based on information released by regulatory bodies including the CDC and federal and state organizations. These policies and algorithms were followed during the patient's care in the ED.  Some ED evaluations and interventions may be delayed as a result of limited staffing during the pandemic.*   Clinical Course as of Jan 23 1945  Sat Jan 24, 2019  0851 48 yo M with h/o recurrent alcoholic gastritis, pancreatitis, s/p recent AMA discharge for n/v here with abdominal pain, nausea, vomiting. Suspect recurrent alcoholic gastritis with possible chronic pancreatitis. Pt reported blood in emesis and stools but Hgb is 15.5 here despite reported vomiting "more than 20" times. While pt could have component of alcoholic gastritis or MWT, no signs of significant GI bleed and he has no h/o varices. Will start protonix, non-narcotic analgesia/antiemetics and monitor.   [CI]  0905 CMP overall reassuring. LFTs ast baseline. EtOH 290. Hgb stable as mentioned.   [CI]    Clinical Course User Index [CI] Shaune PollackIsaacs, Rhodes Calvert, MD    Medical Decision Making:  As above. Pt sobered in ED and feels markedly improved s/p droperidol. He has had no emesis despite >5 hr in ED obs and is tolerating PO without difficulty. Abdomen is soft, labs reassuring. D/c with continued antacids and supportive care.  ____________________________________________  FINAL CLINICAL IMPRESSION(S) / ED DIAGNOSES  Final diagnoses:  Epigastric pain  Acute alcoholic gastritis without hemorrhage      MEDICATIONS GIVEN DURING THIS VISIT:  Medications  droperidol (INAPSINE) 2.5 MG/ML injection 2.5 mg (2.5 mg Intravenous Given 01/24/19 0850)  pantoprazole (PROTONIX) injection 40 mg (40 mg Intravenous Given 01/24/19 0850)  sodium chloride 0.9 % bolus 1,000 mL (0 mLs Intravenous Stopped 01/24/19 1240)  alum & mag hydroxide-simeth (MAALOX/MYLANTA) 200-200-20 MG/5ML suspension 30 mL (30 mLs Oral Given 01/24/19 1425)    And  lidocaine (XYLOCAINE) 2 % viscous mouth solution 15 mL (15 mLs Oral Given 01/24/19 1425)     ED Discharge Orders    None       Note:  This document was prepared using Dragon voice recognition software and may include unintentional dictation errors.   Duffy Bruce, MD 01/24/19 1946

## 2019-01-24 NOTE — ED Notes (Signed)
Pt was recently DC from inpatient for alcohol induced pancreatitis. Pt states he was DC and began drinking "too much for you," in reference to this Probation officer. States beer but won't answer how many specifically or what time he drank them. Pt denies drug use, states "only stuff that's free" and then laughs. States "my liver hurts" and has requested pain medication multiple times during triage process. Informed EDP is with another pt but will see pt shortly. Pt drifts off to sleep in between questions. States nausea, no vomiting noticed at this time but states he vomited blood with EMS.

## 2019-01-24 NOTE — ED Notes (Signed)
EDP at bedside  

## 2019-01-24 NOTE — ED Notes (Signed)
Pt ripped IV out. Refusing to keep pulse ox on.

## 2019-01-24 NOTE — ED Notes (Signed)
Pt able to tolerate ginger ale with no n/v.

## 2019-01-24 NOTE — ED Notes (Signed)
Pt taken to xray 

## 2019-01-24 NOTE — ED Triage Notes (Addendum)
PT here via [facility] from [facility]. Per EMS pt reports throwing up blood and having red bowel movements.  Pt reports drinking a large amount of alcohol.   EMS VS- 171/81, HR 94, 92% RA, T 98.2.

## 2019-09-08 ENCOUNTER — Emergency Department: Payer: Self-pay

## 2019-09-08 ENCOUNTER — Emergency Department: Admission: EM | Admit: 2019-09-08 | Discharge: 2019-09-08 | Discharge status: Against Medical Advice | Payer: Self-pay

## 2019-09-08 ENCOUNTER — Other Ambulatory Visit: Payer: Self-pay

## 2019-09-08 ENCOUNTER — Encounter: Payer: Self-pay | Admitting: Emergency Medicine

## 2019-09-08 ENCOUNTER — Emergency Department
Admission: EM | Admit: 2019-09-08 | Discharge: 2019-09-09 | Disposition: A | Discharge status: Home / Self Care | Payer: Self-pay | Attending: Student in an Organized Health Care Education/Training Program | Admitting: Student in an Organized Health Care Education/Training Program

## 2019-09-08 DIAGNOSIS — Z79899 Other long term (current) drug therapy: Secondary | ICD-10-CM | POA: Insufficient documentation

## 2019-09-08 DIAGNOSIS — I10 Essential (primary) hypertension: Secondary | ICD-10-CM | POA: Insufficient documentation

## 2019-09-08 DIAGNOSIS — R109 Unspecified abdominal pain: Secondary | ICD-10-CM | POA: Insufficient documentation

## 2019-09-08 DIAGNOSIS — R519 Headache, unspecified: Secondary | ICD-10-CM | POA: Insufficient documentation

## 2019-09-08 DIAGNOSIS — M542 Cervicalgia: Secondary | ICD-10-CM | POA: Insufficient documentation

## 2019-09-08 DIAGNOSIS — R0789 Other chest pain: Secondary | ICD-10-CM | POA: Insufficient documentation

## 2019-09-08 DIAGNOSIS — F17210 Nicotine dependence, cigarettes, uncomplicated: Secondary | ICD-10-CM | POA: Insufficient documentation

## 2019-09-08 LAB — COMPREHENSIVE METABOLIC PANEL
ALT: 125 U/L — ABNORMAL HIGH (ref 0–44)
AST: 159 U/L — ABNORMAL HIGH (ref 15–41)
Albumin: 3.7 g/dL (ref 3.5–5.0)
Alkaline Phosphatase: 86 U/L (ref 38–126)
Anion gap: 12 (ref 5–15)
BUN: <5 mg/dL — ABNORMAL LOW (ref 6–20)
CO2: 25 mmol/L (ref 22–32)
Calcium: 8.7 mg/dL — ABNORMAL LOW (ref 8.9–10.3)
Chloride: 93 mmol/L — ABNORMAL LOW (ref 98–111)
Creatinine, Ser: 0.87 mg/dL (ref 0.61–1.24)
GFR calc Af Amer: >60 mL/min (ref >60)
GFR calc non Af Amer: >60 mL/min (ref >60)
Glucose, Bld: 186 mg/dL — ABNORMAL HIGH (ref 70–99)
Potassium: 3.6 mmol/L (ref 3.5–5.1)
Sodium: 130 mmol/L — ABNORMAL LOW (ref 135–145)
Total Bilirubin: 0.8 mg/dL (ref 0.3–1.2)
Total Protein: 7.2 g/dL (ref 6.5–8.1)

## 2019-09-08 LAB — CBC
HCT: 44.3 % (ref 39.0–52.0)
Hemoglobin: 15.8 g/dL (ref 13.0–17.0)
MCH: 33.1 pg (ref 26.0–34.0)
MCHC: 35.7 g/dL (ref 30.0–36.0)
MCV: 92.7 fL (ref 80.0–100.0)
Platelets: 210 10*3/uL (ref 150–400)
RBC: 4.78 MIL/uL (ref 4.22–5.81)
RDW: 15.6 % — ABNORMAL HIGH (ref 11.5–15.5)
WBC: 8.3 10*3/uL (ref 4.0–10.5)
nRBC: 0.2 % (ref 0.0–0.2)

## 2019-09-08 LAB — LIPASE, BLOOD: Lipase: 33 U/L (ref 11–51)

## 2019-09-08 LAB — TROPONIN I (HIGH SENSITIVITY): Troponin I (High Sensitivity): 8 ng/L (ref <18)

## 2019-09-08 MED ORDER — IOHEXOL 300 MG/ML  SOLN
100.0000 mL | Freq: Once | INTRAMUSCULAR | Status: AC | PRN
  Administered 2019-09-08: 100 mL via INTRAVENOUS
  Filled 2019-09-08: qty 100

## 2019-09-08 MED ORDER — SODIUM CHLORIDE 0.9 % IV BOLUS
1000.0000 mL | Freq: Once | INTRAVENOUS | Status: AC
  Administered 2019-09-08: 1000 mL via INTRAVENOUS

## 2019-09-08 MED ORDER — MELOXICAM 15 MG PO TABS
15.0000 mg | ORAL_TABLET | Freq: Every day | ORAL | 0 refills | Status: AC
Start: 2019-09-08 — End: 2019-09-18

## 2019-09-08 MED ORDER — METHOCARBAMOL 500 MG PO TABS
1000.0000 mg | ORAL_TABLET | Freq: Once | ORAL | Status: AC
  Administered 2019-09-08: 1000 mg via ORAL
  Filled 2019-09-08: qty 2

## 2019-09-08 MED ORDER — METHOCARBAMOL 500 MG PO TABS: 500.0000 mg | ORAL_TABLET | Freq: Four times a day (QID) | ORAL | 0 refills | Status: DC | PRN

## 2019-09-08 MED ORDER — OXYCODONE-ACETAMINOPHEN 5-325 MG PO TABS
1.0000 | ORAL_TABLET | Freq: Once | ORAL | Status: AC
  Administered 2019-09-08: 1 via ORAL
  Filled 2019-09-08: qty 1

## 2019-09-08 MED ORDER — IBUPROFEN 600 MG PO TABS
600.0000 mg | ORAL_TABLET | Freq: Once | ORAL | Status: AC
  Administered 2019-09-08: 600 mg via ORAL
  Filled 2019-09-08: qty 1

## 2019-09-08 NOTE — ED Notes (Signed)
Pt reports no change in pain. Pt had stated one percocet was not going to be enough. Advised pt that the pill had not gotten into his system yet.

## 2019-09-08 NOTE — ED Notes (Signed)
Pt called x's 3, no response ?
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
## 2019-09-08 NOTE — ED Triage Notes (Signed)
Pt called from [facility], no response
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
## 2019-09-08 NOTE — ED Triage Notes (Signed)
Pt called for triage, no response. 

## 2019-11-05 ENCOUNTER — Other Ambulatory Visit: Payer: Self-pay

## 2019-11-05 DIAGNOSIS — R630 Anorexia: Secondary | ICD-10-CM | POA: Insufficient documentation

## 2019-11-05 DIAGNOSIS — R109 Unspecified abdominal pain: Secondary | ICD-10-CM | POA: Insufficient documentation

## 2019-11-05 DIAGNOSIS — Z5321 Procedure and treatment not carried out due to patient leaving prior to being seen by health care provider: Secondary | ICD-10-CM | POA: Insufficient documentation

## 2019-11-05 LAB — COMPREHENSIVE METABOLIC PANEL
ALT: 84 U/L — ABNORMAL HIGH (ref 0–44)
AST: 127 U/L — ABNORMAL HIGH (ref 15–41)
Albumin: 3.7 g/dL (ref 3.5–5.0)
Alkaline Phosphatase: 101 U/L (ref 38–126)
Anion gap: 13 (ref 5–15)
BUN: <5 mg/dL — ABNORMAL LOW (ref 6–20)
CO2: 25 mmol/L (ref 22–32)
Calcium: 8.7 mg/dL — ABNORMAL LOW (ref 8.9–10.3)
Chloride: 94 mmol/L — ABNORMAL LOW (ref 98–111)
Creatinine, Ser: 0.68 mg/dL (ref 0.61–1.24)
GFR calc Af Amer: >60 mL/min (ref >60)
GFR calc non Af Amer: >60 mL/min (ref >60)
Glucose, Bld: 264 mg/dL — ABNORMAL HIGH (ref 70–99)
Potassium: 3.5 mmol/L (ref 3.5–5.1)
Sodium: 132 mmol/L — ABNORMAL LOW (ref 135–145)
Total Bilirubin: 1 mg/dL (ref 0.3–1.2)
Total Protein: 7.2 g/dL (ref 6.5–8.1)

## 2019-11-05 LAB — CBC
HCT: 40.3 % (ref 39.0–52.0)
Hemoglobin: 14.2 g/dL (ref 13.0–17.0)
MCH: 34.9 pg — ABNORMAL HIGH (ref 26.0–34.0)
MCHC: 35.2 g/dL (ref 30.0–36.0)
MCV: 99 fL (ref 80.0–100.0)
Platelets: 194 10*3/uL (ref 150–400)
RBC: 4.07 MIL/uL — ABNORMAL LOW (ref 4.22–5.81)
RDW: 15.3 % (ref 11.5–15.5)
WBC: 9 10*3/uL (ref 4.0–10.5)
nRBC: 0 % (ref 0.0–0.2)

## 2019-11-05 LAB — LIPASE, BLOOD: Lipase: 34 U/L (ref 11–51)

## 2019-11-05 NOTE — ED Triage Notes (Signed)
Patient c/o abdominal pain, lack of appetite and energy X 4-5 days.

## 2019-11-06 ENCOUNTER — Emergency Department
Admission: EM | Admit: 2019-11-06 | Discharge: 2019-11-06 | Disposition: A | Discharge status: Home / Self Care | Payer: Self-pay | Attending: Emergency Medicine | Admitting: Emergency Medicine

## 2019-11-06 LAB — URINALYSIS, COMPLETE (UACMP) WITH MICROSCOPIC
Bacteria, UA: NONE SEEN
Bilirubin Urine: NEGATIVE
Glucose, UA: >=500 mg/dL — AB
Hgb urine dipstick: NEGATIVE
Ketones, ur: NEGATIVE mg/dL
Leukocytes,Ua: NEGATIVE
Nitrite: NEGATIVE
Protein, ur: NEGATIVE mg/dL
Specific Gravity, Urine: 1.005 (ref 1.005–1.030)
Squamous Epithelial / HPF: NONE SEEN (ref 0–5)
WBC, UA: NONE SEEN WBC/hpf (ref 0–5)
pH: 6 (ref 5.0–8.0)

## 2019-11-11 IMAGING — CT CT ABD-PELV W/ CM
2 of 5 series · 16 of 46 positions shown, 18 images · IV contrast (APPLIED)
Comparison: 11/20/2018

CLINICAL DATA: Pt arrived via EMS from home with c/o continued
abdominal pain. Pt seen on [DATE] in ED for acute pancreatitis. Pt
admits to ETOH.

EXAM:
CT ABDOMEN AND PELVIS WITH CONTRAST
TECHNIQUE: Multidetector CT imaging of the abdomen and pelvis was performed
using the standard protocol following bolus administration of
intravenous contrast.
CONTRAST:  100mL OMNIPAQUE IOHEXOL 300 MG/ML  SOLN

[Series 2: routine abd/pel with · axial · 0.89mm/px · z∈[-492,-47]mm · 13 of 101 slices shown, 15 images]
[im 6/101  soft-tissue]
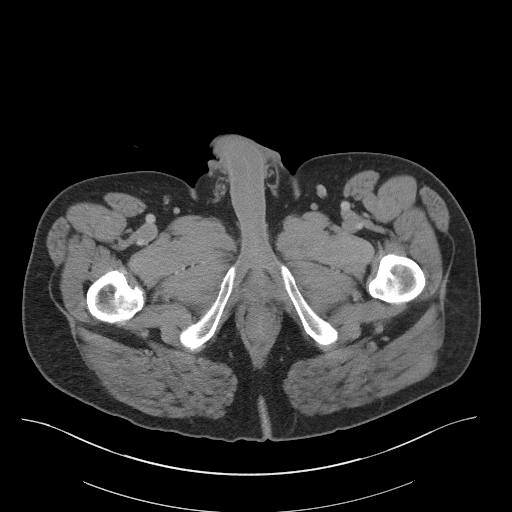
[im 6/101  bone]
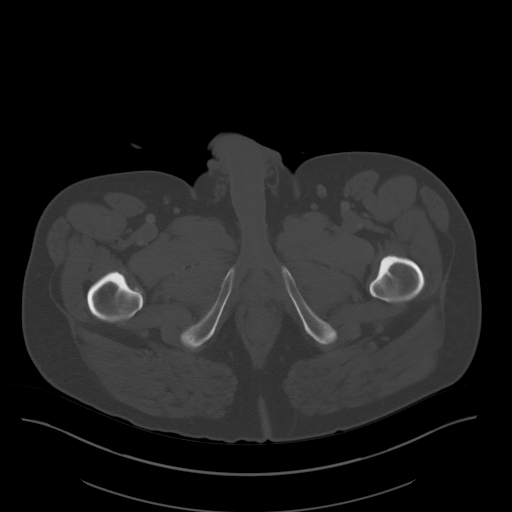
[im 16/101  soft-tissue]
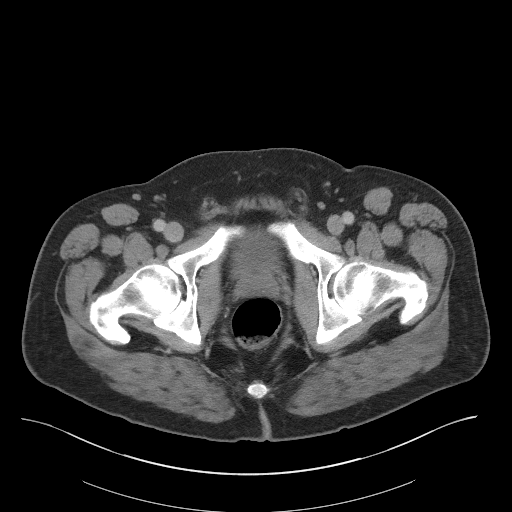
[im 22/101  soft-tissue]
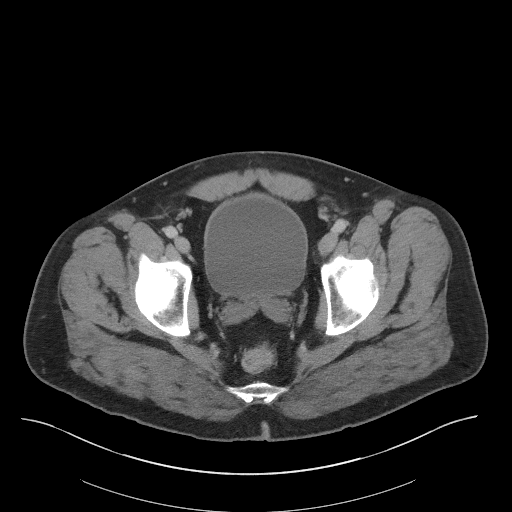
[im 27/101  soft-tissue]
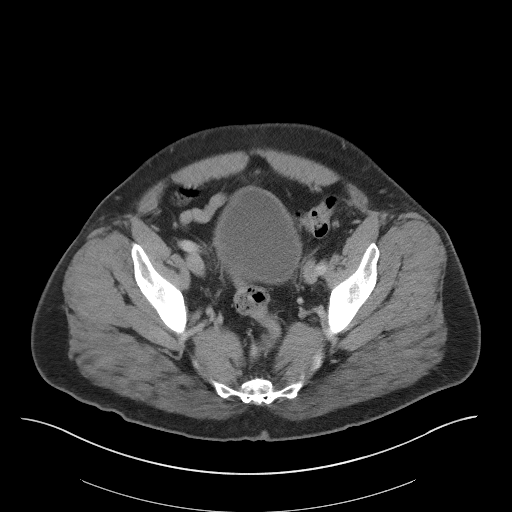
[im 37/101  soft-tissue]
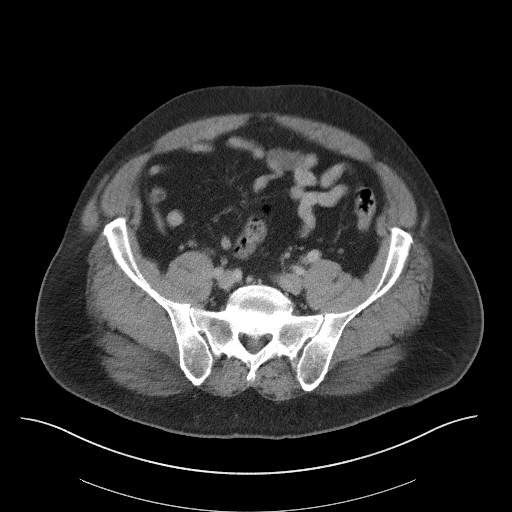
[im 43/101  soft-tissue]
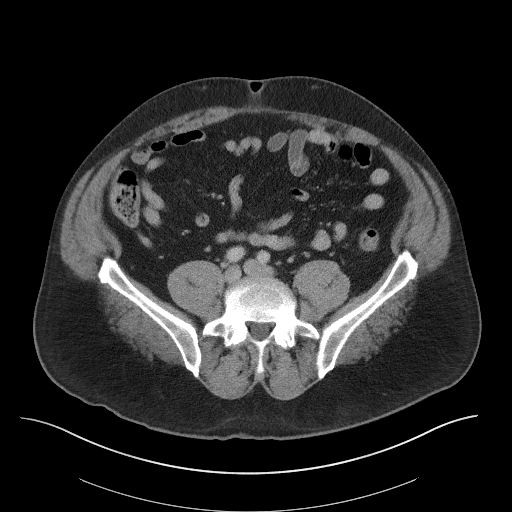
[im 53/101  soft-tissue]
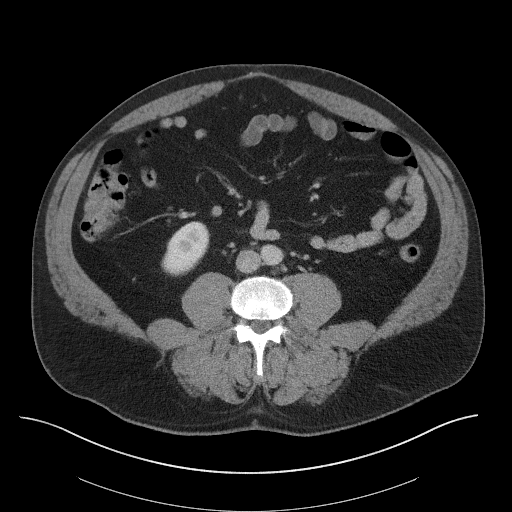
[im 58/101  soft-tissue]
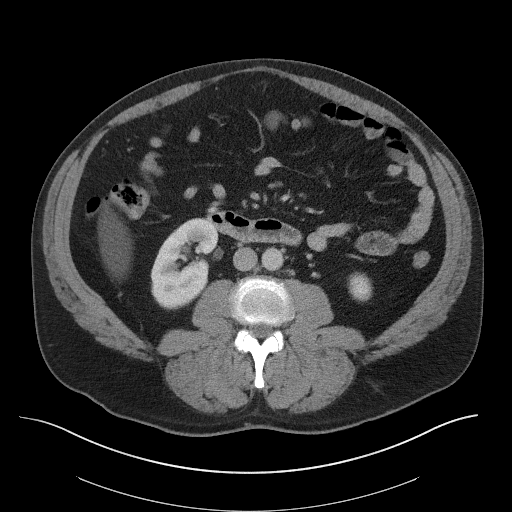
[im 64/101  soft-tissue]
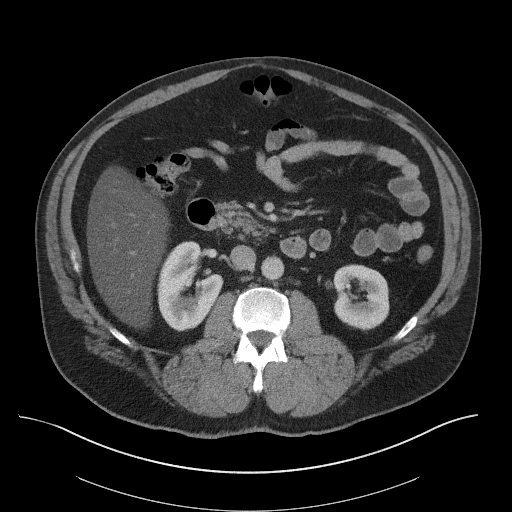
[im 64/101  bone]
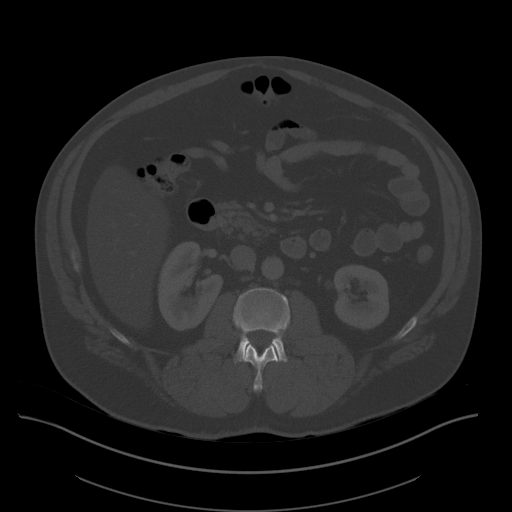
[im 74/101  soft-tissue]
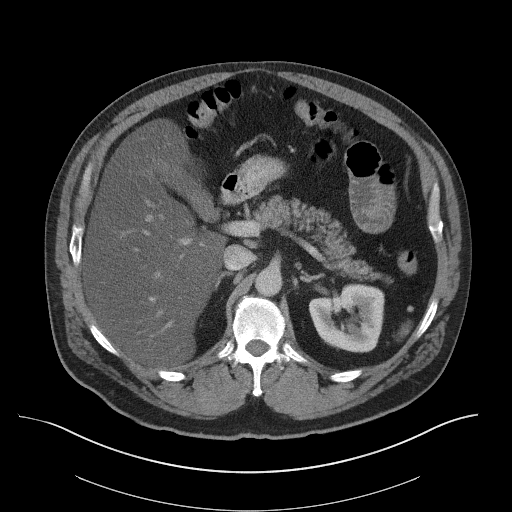
[im 79/101  soft-tissue]
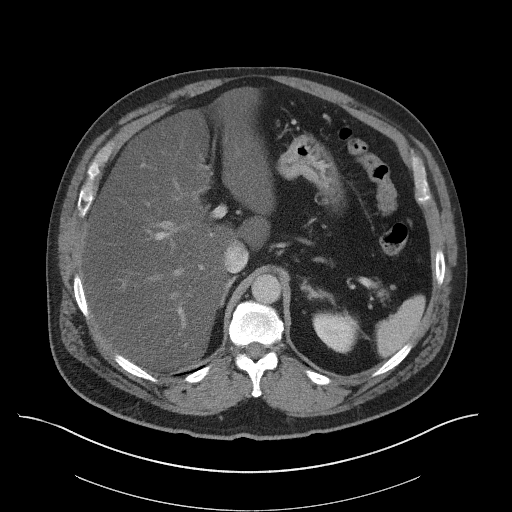
[im 85/101  soft-tissue]
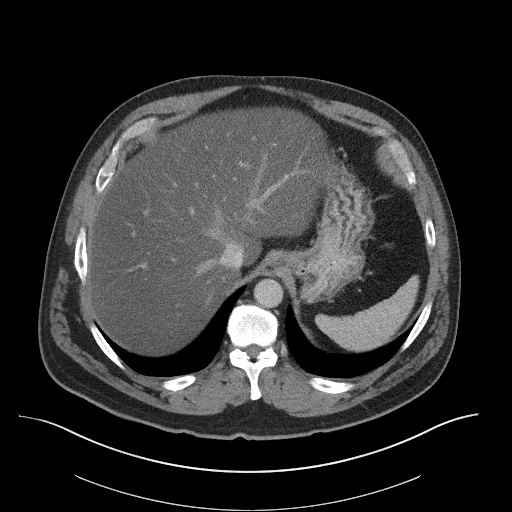
[im 95/101  soft-tissue]
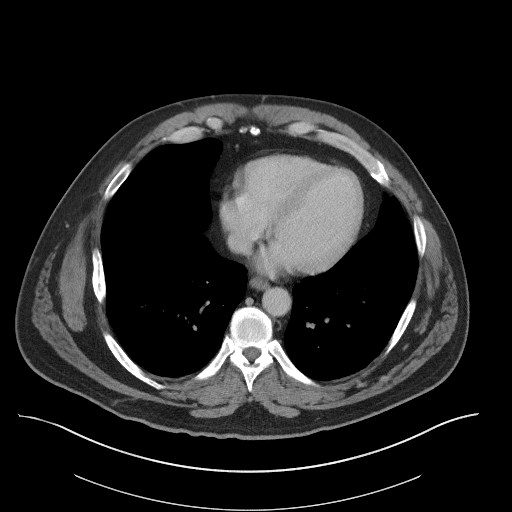

[Series 5: coronal st · coronal · 0.79mm/px · 3 of 111 slices shown]
[im 37/111  soft-tissue]
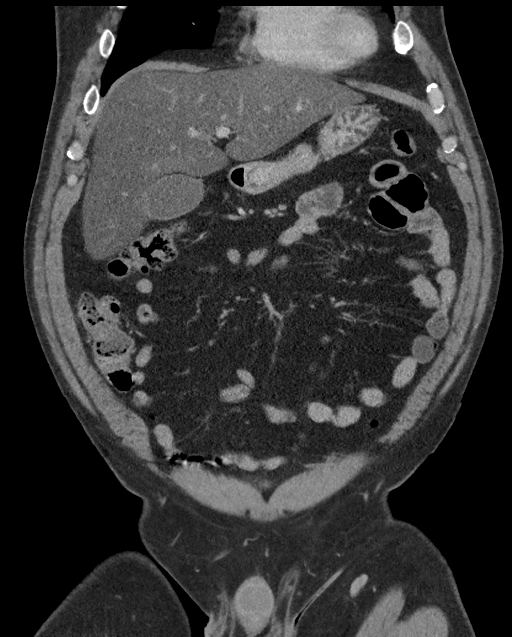
[im 49/111  soft-tissue]
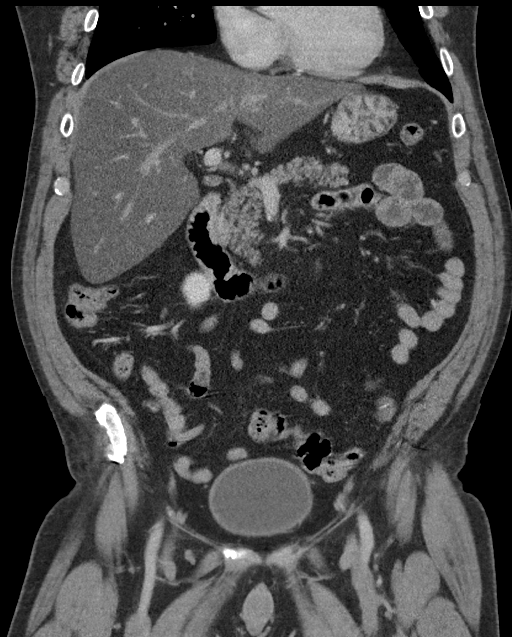
[im 62/111  soft-tissue]
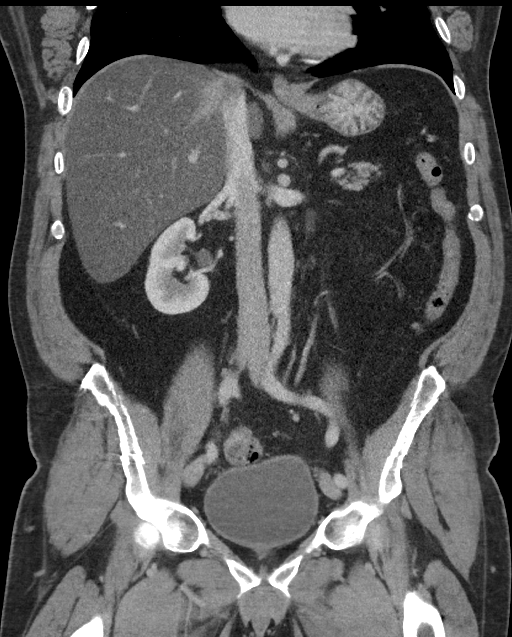

[16 of 46 positions shown; findings below may reference images not displayed]

FINDINGS: Lower chest: No acute abnormality.

Hepatobiliary: Marked diffuse low-attenuation of the liver,
consistent with severe hepatic steatosis. Gallbladder is present and
unremarkable in CT appearance.

Pancreas: Unremarkable. No pancreatic ductal dilatation or
surrounding inflammatory changes.

Spleen: Normal in size without focal abnormality.

Adrenals/Urinary Tract: Normal adrenal glands. Symmetric size and
enhancement of both kidneys. No renal mass. No hydronephrosis.
Ureters are unremarkable. The bladder and visualized portion of the
urethra are normal.

Stomach/Bowel: Normal appearance of the stomach. Thickened proximal
jejunal loops without evidence for obstruction. Motion degraded
images of the RIGHT LOWER QUADRANT. Appendix is present and contains
1-2 appendicoliths versus small amount of contrast. Otherwise the
size of the appendix is normal in there is no associated
inflammatory change. Colon is not obstructed. Scattered diverticula
are present but there is no acute diverticulitis.

Vascular/Lymphatic: No significant vascular findings are present. No
enlarged abdominal or pelvic lymph nodes.

Reproductive: Prostate is unremarkable.

Other: Small fat containing hernia. No ascites.

Musculoskeletal: No acute or significant osseous findings.
IMPRESSION: 1. Marked hepatic steatosis.
2. No CT findings of pancreatitis or its complications.
3. Thickened proximal jejunal loops, consistent with enteritis. No
evidence for bowel obstruction.
4. Colonic diverticulosis without acute diverticulitis.
5. Small fat containing umbilical hernia.
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
## 2019-12-17 DIAGNOSIS — J438 Other emphysema: Secondary | ICD-10-CM | POA: Insufficient documentation

## 2020-02-09 ENCOUNTER — Inpatient Hospital Stay
Admission: EM | Admit: 2020-02-09 | Discharge: 2020-02-13 | DRG: 391 | Disposition: A | Discharge status: Home / Self Care | Payer: Self-pay | Attending: Internal Medicine | Admitting: Internal Medicine

## 2020-02-09 ENCOUNTER — Emergency Department: Payer: Self-pay

## 2020-02-09 ENCOUNTER — Other Ambulatory Visit: Payer: Self-pay

## 2020-02-09 DIAGNOSIS — E538 Deficiency of other specified B group vitamins: Secondary | ICD-10-CM | POA: Diagnosis present

## 2020-02-09 DIAGNOSIS — K649 Unspecified hemorrhoids: Secondary | ICD-10-CM | POA: Diagnosis present

## 2020-02-09 DIAGNOSIS — E038 Other specified hypothyroidism: Secondary | ICD-10-CM | POA: Diagnosis present

## 2020-02-09 DIAGNOSIS — E119 Type 2 diabetes mellitus without complications: Secondary | ICD-10-CM | POA: Diagnosis present

## 2020-02-09 DIAGNOSIS — K859 Acute pancreatitis without necrosis or infection, unspecified: Principal | ICD-10-CM | POA: Diagnosis present

## 2020-02-09 DIAGNOSIS — F17200 Nicotine dependence, unspecified, uncomplicated: Secondary | ICD-10-CM | POA: Diagnosis present

## 2020-02-09 DIAGNOSIS — K5900 Constipation, unspecified: Secondary | ICD-10-CM | POA: Diagnosis present

## 2020-02-09 DIAGNOSIS — F17210 Nicotine dependence, cigarettes, uncomplicated: Secondary | ICD-10-CM | POA: Diagnosis present

## 2020-02-09 DIAGNOSIS — K860 Alcohol-induced chronic pancreatitis: Secondary | ICD-10-CM | POA: Diagnosis present

## 2020-02-09 DIAGNOSIS — K2920 Alcoholic gastritis without bleeding: Principal | ICD-10-CM | POA: Diagnosis present

## 2020-02-09 DIAGNOSIS — Z79899 Other long term (current) drug therapy: Secondary | ICD-10-CM

## 2020-02-09 DIAGNOSIS — K766 Portal hypertension: Secondary | ICD-10-CM | POA: Diagnosis present

## 2020-02-09 DIAGNOSIS — F1910 Other psychoactive substance abuse, uncomplicated: Secondary | ICD-10-CM | POA: Diagnosis present

## 2020-02-09 DIAGNOSIS — K7030 Alcoholic cirrhosis of liver without ascites: Secondary | ICD-10-CM | POA: Diagnosis present

## 2020-02-09 DIAGNOSIS — D7589 Other specified diseases of blood and blood-forming organs: Secondary | ICD-10-CM | POA: Diagnosis present

## 2020-02-09 DIAGNOSIS — K2960 Other gastritis without bleeding: Secondary | ICD-10-CM | POA: Diagnosis present

## 2020-02-09 DIAGNOSIS — G8929 Other chronic pain: Secondary | ICD-10-CM | POA: Diagnosis present

## 2020-02-09 DIAGNOSIS — M62838 Other muscle spasm: Secondary | ICD-10-CM | POA: Diagnosis present

## 2020-02-09 DIAGNOSIS — K529 Noninfective gastroenteritis and colitis, unspecified: Secondary | ICD-10-CM | POA: Diagnosis present

## 2020-02-09 DIAGNOSIS — Z7951 Long term (current) use of inhaled steroids: Secondary | ICD-10-CM

## 2020-02-09 DIAGNOSIS — I10 Essential (primary) hypertension: Secondary | ICD-10-CM | POA: Diagnosis present

## 2020-02-09 DIAGNOSIS — J449 Chronic obstructive pulmonary disease, unspecified: Secondary | ICD-10-CM | POA: Diagnosis present

## 2020-02-09 DIAGNOSIS — Z7984 Long term (current) use of oral hypoglycemic drugs: Secondary | ICD-10-CM

## 2020-02-09 DIAGNOSIS — B9681 Helicobacter pylori [H. pylori] as the cause of diseases classified elsewhere: Secondary | ICD-10-CM | POA: Diagnosis present

## 2020-02-09 DIAGNOSIS — K219 Gastro-esophageal reflux disease without esophagitis: Secondary | ICD-10-CM | POA: Diagnosis present

## 2020-02-09 DIAGNOSIS — Z720 Tobacco use: Secondary | ICD-10-CM | POA: Diagnosis present

## 2020-02-09 DIAGNOSIS — Z23 Encounter for immunization: Secondary | ICD-10-CM

## 2020-02-09 DIAGNOSIS — Z833 Family history of diabetes mellitus: Secondary | ICD-10-CM

## 2020-02-09 DIAGNOSIS — Z20822 Contact with and (suspected) exposure to covid-19: Secondary | ICD-10-CM | POA: Diagnosis present

## 2020-02-09 DIAGNOSIS — F1010 Alcohol abuse, uncomplicated: Secondary | ICD-10-CM | POA: Diagnosis present

## 2020-02-09 DIAGNOSIS — E876 Hypokalemia: Secondary | ICD-10-CM | POA: Diagnosis present

## 2020-02-09 DIAGNOSIS — K861 Other chronic pancreatitis: Secondary | ICD-10-CM

## 2020-02-09 DIAGNOSIS — K3189 Other diseases of stomach and duodenum: Secondary | ICD-10-CM | POA: Diagnosis present

## 2020-02-09 HISTORY — DX: Chronic obstructive pulmonary disease, unspecified: J44.9

## 2020-02-09 HISTORY — DX: Type 2 diabetes mellitus without complications: E11.9

## 2020-02-09 LAB — COMPREHENSIVE METABOLIC PANEL
ALT: 42 U/L (ref 0–44)
AST: 90 U/L — ABNORMAL HIGH (ref 15–41)
Albumin: 3.6 g/dL (ref 3.5–5.0)
Alkaline Phosphatase: 101 U/L (ref 38–126)
Anion gap: 10 (ref 5–15)
BUN: <5 mg/dL — ABNORMAL LOW (ref 6–20)
CO2: 26 mmol/L (ref 22–32)
Calcium: 8.5 mg/dL — ABNORMAL LOW (ref 8.9–10.3)
Chloride: 102 mmol/L (ref 98–111)
Creatinine, Ser: 0.74 mg/dL (ref 0.61–1.24)
GFR, Estimated: >60 mL/min (ref >60)
Glucose, Bld: 99 mg/dL (ref 70–99)
Potassium: 3.1 mmol/L — ABNORMAL LOW (ref 3.5–5.1)
Sodium: 138 mmol/L (ref 135–145)
Total Bilirubin: 1 mg/dL (ref 0.3–1.2)
Total Protein: 7.7 g/dL (ref 6.5–8.1)

## 2020-02-09 LAB — URINALYSIS, COMPLETE (UACMP) WITH MICROSCOPIC
Bacteria, UA: NONE SEEN
Bilirubin Urine: NEGATIVE
Glucose, UA: 50 mg/dL — AB
Hgb urine dipstick: NEGATIVE
Ketones, ur: NEGATIVE mg/dL
Leukocytes,Ua: NEGATIVE
Nitrite: NEGATIVE
Protein, ur: 30 mg/dL — AB
Specific Gravity, Urine: 1.016 (ref 1.005–1.030)
Squamous Epithelial / HPF: NONE SEEN (ref 0–5)
pH: 6 (ref 5.0–8.0)

## 2020-02-09 LAB — CBC
HCT: 43.7 % (ref 39.0–52.0)
Hemoglobin: 15.2 g/dL (ref 13.0–17.0)
MCH: 35.9 pg — ABNORMAL HIGH (ref 26.0–34.0)
MCHC: 34.8 g/dL (ref 30.0–36.0)
MCV: 103.3 fL — ABNORMAL HIGH (ref 80.0–100.0)
Platelets: 181 10*3/uL (ref 150–400)
RBC: 4.23 MIL/uL (ref 4.22–5.81)
RDW: 14.8 % (ref 11.5–15.5)
WBC: 9.2 10*3/uL (ref 4.0–10.5)
nRBC: 0 % (ref 0.0–0.2)

## 2020-02-09 LAB — CBG MONITORING, ED: Glucose-Capillary: 103 mg/dL — ABNORMAL HIGH (ref 70–99)

## 2020-02-09 LAB — RESPIRATORY PANEL BY RT PCR (FLU A&B, COVID)
Influenza A by PCR: NEGATIVE
Influenza B by PCR: NEGATIVE
SARS Coronavirus 2 by RT PCR: NEGATIVE

## 2020-02-09 LAB — LIPASE, BLOOD: Lipase: 41 U/L (ref 11–51)

## 2020-02-09 MED ORDER — ONDANSETRON HCL 4 MG/2ML IJ SOLN
4.0000 mg | Freq: Once | INTRAMUSCULAR | Status: AC
  Administered 2020-02-09: 4 mg via INTRAVENOUS
  Filled 2020-02-09: qty 2

## 2020-02-09 MED ORDER — HYDROMORPHONE HCL 1 MG/ML IJ SOLN
1.0000 mg | Freq: Once | INTRAMUSCULAR | Status: AC
  Administered 2020-02-09: 1 mg via INTRAVENOUS
  Filled 2020-02-09: qty 1

## 2020-02-09 MED ORDER — LORAZEPAM 1 MG PO TABS
1.0000 mg | ORAL_TABLET | ORAL | Status: AC | PRN
  Filled 2020-02-09: qty 1

## 2020-02-09 MED ORDER — SODIUM CHLORIDE 0.9 % IV BOLUS
1000.0000 mL | Freq: Once | INTRAVENOUS | Status: AC
  Administered 2020-02-09: 1000 mL via INTRAVENOUS

## 2020-02-09 MED ORDER — SODIUM CHLORIDE 0.9 % IV SOLN: INTRAVENOUS | Status: DC

## 2020-02-09 MED ORDER — IOHEXOL 300 MG/ML  SOLN
100.0000 mL | Freq: Once | INTRAMUSCULAR | Status: AC | PRN
  Administered 2020-02-09: 100 mL via INTRAVENOUS

## 2020-02-09 MED ORDER — POTASSIUM CHLORIDE 10 MEQ/100ML IV SOLN
10.0000 meq | INTRAVENOUS | Status: AC
  Administered 2020-02-10 (×4): 10 meq via INTRAVENOUS
  Filled 2020-02-09: qty 100

## 2020-02-09 MED ORDER — THIAMINE HCL 100 MG/ML IJ SOLN: 100.0000 mg | Freq: Every day | INTRAMUSCULAR | Status: DC

## 2020-02-09 MED ORDER — THIAMINE HCL 100 MG PO TABS
100.0000 mg | ORAL_TABLET | Freq: Every day | ORAL | Status: DC
  Administered 2020-02-10 – 2020-02-13 (×4): 100 mg via ORAL
  Filled 2020-02-09 (×4): qty 1

## 2020-02-09 MED ORDER — PANTOPRAZOLE SODIUM 40 MG IV SOLR
40.0000 mg | Freq: Every day | INTRAVENOUS | Status: DC
  Administered 2020-02-10 (×2): 40 mg via INTRAVENOUS
  Filled 2020-02-09 (×2): qty 40

## 2020-02-09 MED ORDER — SUCRALFATE 1 GM/10ML PO SUSP
1.0000 g | Freq: Three times a day (TID) | ORAL | Status: DC
  Administered 2020-02-10 – 2020-02-13 (×12): 1 g via ORAL
  Filled 2020-02-09 (×12): qty 10

## 2020-02-09 MED ORDER — LACTATED RINGERS IV BOLUS: 1000.0000 mL | Freq: Once | INTRAVENOUS | Status: DC

## 2020-02-09 MED ORDER — DROPERIDOL 2.5 MG/ML IJ SOLN
2.5000 mg | Freq: Once | INTRAMUSCULAR | Status: AC
  Administered 2020-02-09: 2.5 mg via INTRAVENOUS
  Filled 2020-02-09: qty 2

## 2020-02-09 MED ORDER — LORAZEPAM 2 MG/ML IJ SOLN
1.0000 mg | INTRAMUSCULAR | Status: AC | PRN
  Administered 2020-02-10 (×2): 2 mg via INTRAVENOUS
  Filled 2020-02-09 (×2): qty 1

## 2020-02-09 MED ORDER — INSULIN ASPART 100 UNIT/ML ~~LOC~~ SOLN
0.0000 [IU] | SUBCUTANEOUS | Status: DC
  Administered 2020-02-10 – 2020-02-13 (×5): 1 [IU] via SUBCUTANEOUS
  Filled 2020-02-09 (×5): qty 1

## 2020-02-09 MED ORDER — ADULT MULTIVITAMIN W/MINERALS CH
1.0000 | ORAL_TABLET | Freq: Every day | ORAL | Status: DC
  Administered 2020-02-10 – 2020-02-13 (×4): 1 via ORAL
  Filled 2020-02-09 (×4): qty 1

## 2020-02-09 MED ORDER — FOLIC ACID 1 MG PO TABS
1.0000 mg | ORAL_TABLET | Freq: Every day | ORAL | Status: DC
  Administered 2020-02-10 – 2020-02-13 (×4): 1 mg via ORAL
  Filled 2020-02-09 (×4): qty 1

## 2020-02-09 NOTE — ED Provider Notes (Signed)
[facility] Emergency Department Provider Note  ____________________________________________   First MD Initiated Contact with Patient 02/09/20 1848     (approximate)  I have reviewed the triage vital signs and the nursing notes.   HISTORY  Chief Complaint Abdominal Pain    HPI Kristopher [name] is a 49 y.o. male  With h/o EtOH dependence, COPD, chronic pancreatitis, DM, here with abd pain, nausea, vomiting. Pt reports that for the past week he has had increasingly severe epigastric abdominal pain, which he describes as aching, cramp like, and severe. It is 10/10. He states it feels similar to his prior pancreatitis episodes. He's had associated n/v/d. He has had some slight red blood in stool but feels this is related to hemorrhoids. Pain worse w/ eating, drinking. No alleviating factors.        Past Medical History:  Diagnosis Date  . Alcohol dependence (HCC)   . COPD (chronic obstructive pulmonary disease) (HCC)   . Diabetes mellitus without complication (HCC)   . GERD (gastroesophageal reflux disease)   . Hypertension   . Pancreatitis   . Pancreatitis     Patient Active Problem List   Diagnosis Date Noted  . Tobacco abuse 02/10/2020  . Acute on chronic pancreatitis (HCC) 02/09/2020  . ETOH abuse 02/09/2020  . Hemorrhoids 02/09/2020  . Portal hypertension (HCC) 02/09/2020  . Alcoholic cirrhosis of liver without ascites (HCC) 02/09/2020  . Hypokalemia 02/09/2020  . Abdominal pain 01/21/2019  . Intractable nausea and vomiting 01/20/2019  . Acute alcoholic pancreatitis 01/06/2019    Past Surgical History:  Procedure Laterality Date  . ABDOMINAL SURGERY      Prior to Admission medications   Medication Sig Start Date End Date Taking? Authorizing Provider  albuterol (VENTOLIN HFA) 108 (90 Base) MCG/ACT inhaler Inhale 2 puffs into the lungs every 6 (six) hours as needed for wheezing or shortness of breath.   Yes [provider]    amLODipine (NORVASC) 10 MG tablet Take 10 mg by mouth daily.   Yes [provider]  fluticasone (FLONASE) 50 MCG/ACT nasal spray Place 1 spray into both nostrils daily.   Yes [provider]  Fluticasone-Salmeterol (ADVAIR) 500-50 MCG/DOSE AEPB Inhale 1 puff into the lungs 2 (two) times daily.   Yes [provider]  gabapentin (NEURONTIN) 300 MG capsule Take 300 mg by mouth 3 (three) times daily.   Yes [provider]  lisinopril (ZESTRIL) 10 MG tablet Take 10 mg by mouth daily.   Yes [provider]  melatonin 3 MG TABS tablet Take 3 mg by mouth at bedtime as needed (sleep).   Yes [provider]  metFORMIN (GLUCOPHAGE) 500 MG tablet Take 500 mg by mouth 2 (two) times daily with a meal.   Yes [provider]  pantoprazole (PROTONIX) 40 MG tablet Take 40 mg by mouth daily.   Yes [provider]    Allergies Patient has no known allergies.  Family History  Problem Relation Age of Onset  . Diabetes Other     Social History Social History   Tobacco Use  . Smoking status: Current Every Day Smoker    Packs/day: 1.00    Types: Cigarettes  . Smokeless tobacco: Never Used  Vaping Use  . Vaping Use: Never used  Substance Use Topics  . Alcohol use: Yes    Comment: 5 hard lemonades a day  . Drug use: Not Currently    Types: Marijuana, Cocaine    Review of Systems  Review of Systems  Constitutional: Positive for fatigue. Negative for chills and fever.  HENT: Negative for sore throat.   Respiratory: Negative for shortness of breath.   Cardiovascular: Negative for chest pain.  Gastrointestinal: Positive for abdominal distention, abdominal pain, blood in stool, nausea and vomiting.  Genitourinary: Negative for flank pain.  Musculoskeletal: Negative for neck pain.  Skin: Negative for rash and wound.  Allergic/Immunologic: Negative for immunocompromised state.  Neurological: Negative for weakness and numbness.   Hematological: Does not bruise/bleed easily.  All other systems reviewed and are negative.    ____________________________________________  PHYSICAL EXAM:      VITAL SIGNS: ED Triage Vitals [02/09/20 1705]  Enc Vitals Group     BP 135/75     Pulse Rate 98     Resp 17     Temp 98.6 F (37 C)     Temp Source Oral     SpO2 96 %     Weight 260 lb (117.9 kg)     Height 5\' 11"  (1.803 m)     Head Circumference      Peak Flow      Pain Score 9     Pain Loc      Pain Edu?      Excl. in GC?      Physical Exam Vitals and nursing note reviewed.  Constitutional:      General: He is not in acute distress.    Appearance: He is well-developed.     Comments: Appears uncomfortable  HENT:     Head: Normocephalic and atraumatic.  Eyes:     Conjunctiva/sclera: Conjunctivae normal.  Cardiovascular:     Rate and Rhythm: Normal rate and regular rhythm.     Heart sounds: Normal heart sounds. No murmur heard.  No friction rub.  Pulmonary:     Effort: Pulmonary effort is normal. No respiratory distress.     Breath sounds: Normal breath sounds. No wheezing or rales.  Abdominal:     General: Abdomen is protuberant. There is distension.     Palpations: Abdomen is soft.     Tenderness: There is abdominal tenderness in the epigastric area. There is guarding. There is no rebound.  Musculoskeletal:     Cervical back: Neck supple.  Skin:    General: Skin is warm.     Capillary Refill: Capillary refill takes less than 2 seconds.  Neurological:     Mental Status: He is alert and oriented to person, place, and time.     Motor: No abnormal muscle tone.       ____________________________________________   LABS (all labs ordered are listed, but only abnormal results are displayed)  Labs Reviewed  COMPREHENSIVE METABOLIC PANEL - Abnormal; Notable for the following components:      Result Value   Potassium 3.1 (*)    BUN <5 (*)    Calcium 8.5 (*)    AST 90 (*)    All other components  within normal limits  CBC - Abnormal; Notable for the following components:   MCV 103.3 (*)    MCH 35.9 (*)    All other components within normal limits  URINALYSIS, COMPLETE (UACMP) WITH MICROSCOPIC - Abnormal; Notable for the following components:   Color, Urine YELLOW (*)    APPearance CLEAR (*)    Glucose, UA 50 (*)    Protein, ur 30 (*)    All other components within normal limits  CBG MONITORING, ED - Abnormal; Notable for the following components:  Glucose-Capillary 103 (*)    All other components within normal limits  RESPIRATORY PANEL BY RT PCR (FLU A&B, COVID)  GASTROINTESTINAL PANEL BY PCR, STOOL (REPLACES STOOL CULTURE)  LIPASE, BLOOD  PROTIME-INR  MAGNESIUM  PHOSPHORUS  CK  HEMOGLOBIN A1C  VITAMIN B12  FOLATE  HEPATITIS PANEL, ACUTE  AMMONIA    ____________________________________________  EKG: Normal sinus ryhthm, VR 85. QRS 110, QTc 480. No acute ST elevation or depressions. No ischemia or infarct. ________________________________________  RADIOLOGY All imaging, including plain films, CT scans, and ultrasounds, independently reviewed by me, and interpretations confirmed via formal radiology reads.  ED MD interpretation:   CT A/P: No acute abdominal findings, changes concerning for cirrhosis and fatty infiltration of liver   Official radiology report(s): CT ABDOMEN PELVIS W CONTRAST  Result Date: 02/09/2020 CLINICAL DATA:  Abdominal pain. EXAM: CT ABDOMEN AND PELVIS WITH CONTRAST TECHNIQUE: Multidetector CT imaging of the abdomen and pelvis was performed using the standard protocol following bolus administration of intravenous contrast. CONTRAST:  100mL OMNIPAQUE IOHEXOL 300 MG/ML  SOLN COMPARISON:  CT scan 09/08/2019 FINDINGS: Lower chest: Insert lung bases Hepatobiliary: Patient had severe fatty infiltration of the liver on the prior CT. This is not as evident today but there are some geographic areas of residual fatty infiltration without definite hepatic  lesions or intrahepatic biliary dilatation. Progressive changes of portal venous hypertension with portal venous collaterals more obvious on today's study. The portal and splenic veins are patent. Suspect underlying changes of cirrhosis with prominent hepatic fissures. The gallbladder is unremarkable. No common bile duct dilatation. Pancreas: No mass, inflammation or ductal dilatation. Spleen: Normal size.  No focal lesions. Adrenals/Urinary Tract: The adrenal glands and kidneys are unremarkable. The bladder is normal. Stomach/Bowel: The stomach, duodenum, small bowel and colon are grossly normal without oral contrast. No acute inflammatory changes, mass lesions or obstructive findings. The terminal ileum and appendix are normal. Vascular/Lymphatic: Scattered vascular calcifications but no aneurysm or dissection. The branch vessels are patent. The major venous structures are patent. Small scattered mesenteric and retroperitoneal lymph nodes but no mass or overt adenopathy. Reproductive: The prostate gland and seminal vesicles are unremarkable. Other: No pelvic mass or adenopathy. Small amount of free pelvic fluid is noted. No inguinal mass or adenopathy. No abdominal wall hernia or subcutaneous lesions. Musculoskeletal: No significant bony findings. IMPRESSION: 1. No acute abdominal/pelvic findings, mass lesions or adenopathy. 2. Progressive changes of portal venous hypertension with portal venous collaterals. 3. Suspect underlying changes of cirrhosis. 4. Previous CT with marked fatty infiltration of the liver. I think there are some areas of residual geographic fatty infiltration but no obvious hepatic mass lesions. MRI abdomen without and with contrast may be helpful for further evaluation. 5. Aortic atherosclerosis. Aortic Atherosclerosis (ICD10-I70.0). Electronically Signed   By: Rudie MeyerP.  Gallerani M.D.   On: 02/09/2020 20:39    ____________________________________________  PROCEDURES   Procedure(s)  performed (including Critical Care):  Procedures  ____________________________________________  INITIAL IMPRESSION / MDM / ASSESSMENT AND PLAN / ED COURSE  As part of my medical decision making, I reviewed the following data within the electronic MEDICAL RECORD NUMBER Nursing notes reviewed and incorporated, Old chart reviewed, Notes from prior ED visits, and [place] Controlled Substance Database       *Judge StallMichael K Theisen was evaluated in Emergency Department on 02/10/2020 for the symptoms described in the history of present illness. He was evaluated in the context of the global COVID-19 pandemic, which necessitated consideration that the patient might be at risk for  infection with the SARS-CoV-2 virus that causes COVID-19. Institutional protocols and algorithms that pertain to the evaluation of patients at risk for COVID-19 are in a state of rapid change based on information released by regulatory bodies including the CDC and federal and state organizations. These policies and algorithms were followed during the patient's care in the ED.  Some ED evaluations and interventions may be delayed as a result of limited staffing during the pandemic.*     Medical Decision Making:  49 yo M here with abdominal pain, distension, nausea, vomiting. Labs overall reassuring - no significant leukocytosis. CMP with mild AST elevation likely related to EtOH use. Lipase normal. CT reviewed by me, shows no acute abnormality though given his extensive h/o pancreatitis, clinically I suspect acute on chronic pancreatitis. DDx includes gastritis, less likely hepatitis. Pt given IVF, analgesia, with persistent n/v and pain. Admit.   ____________________________________________  FINAL CLINICAL IMPRESSION(S) / ED DIAGNOSES  Final diagnoses:  Recurrent pancreatitis     MEDICATIONS GIVEN DURING THIS VISIT:  Medications  insulin aspart (novoLOG) injection 0-9 Units (has no administration in time range)  LORazepam (ATIVAN)  tablet 1-4 mg (has no administration in time range)    Or  LORazepam (ATIVAN) injection 1-4 mg (has no administration in time range)  thiamine tablet 100 mg (has no administration in time range)    Or  thiamine (B-1) injection 100 mg (has no administration in time range)  multivitamin with minerals tablet 1 tablet (has no administration in time range)  0.9 %  sodium chloride infusion (has no administration in time range)  folic acid (FOLVITE) tablet 1 mg (has no administration in time range)  potassium chloride 10 mEq in 100 mL IVPB (has no administration in time range)  pantoprazole (PROTONIX) injection 40 mg (has no administration in time range)  sucralfate (CARAFATE) 1 GM/10ML suspension 1 g (has no administration in time range)  nicotine (NICODERM CQ - dosed in mg/24 hours) patch 21 mg (has no administration in time range)  HYDROmorphone (DILAUDID) injection 1 mg (1 mg Intravenous Given 02/09/20 1943)  ondansetron (ZOFRAN) injection 4 mg (4 mg Intravenous Given 02/09/20 1944)  sodium chloride 0.9 % bolus 1,000 mL (1,000 mLs Intravenous New Bag/Given 02/09/20 1944)  iohexol (OMNIPAQUE) 300 MG/ML solution 100 mL (100 mLs Intravenous Contrast Given 02/09/20 2016)  HYDROmorphone (DILAUDID) injection 1 mg (1 mg Intravenous Given 02/09/20 2035)  droperidol (INAPSINE) 2.5 MG/ML injection 2.5 mg (2.5 mg Intravenous Given 02/09/20 2113)  HYDROmorphone (DILAUDID) injection 1 mg (1 mg Intravenous Given 02/09/20 2247)     ED Discharge Orders    None       Note:  This document was prepared using Dragon voice recognition software and may include unintentional dictation errors.   Shaune Pollack, MD 02/10/20 [phone]

## 2020-02-09 NOTE — ED Notes (Signed)
Pt states 10/10 abdo pain that started about 2d ago. Newly dx with DM (9/21). Pt abdomen is distended mid upper abdomen. Pt states has chronic pancreatitis. EDP at bedside. LBM today. Has been having frank red blood in stoool at times and also red blood mixed in regular brown feces since past week. Does have hemorrhoids.

## 2020-02-09 NOTE — H&P (Signed)
Kristopher [name] CBS:[id number] DOB: May 28, 1970 DOA: 02/09/2020     PCP: Patient, No Pcp Per   Outpatient Specialists:   NONE   Patient arrived to ER on 02/09/20 at 1630 Referred by Attending Shaune Pollack, MD   Patient coming from: home Lives   With family causin   Chief Complaint:   Chief Complaint  Patient presents with  . Abdominal Pain    HPI: Kristopher [name] is a 49 y.o. male with medical history significant of EtOH abuse, GERD, HTN, chronic pancreatitis, DM2, COPD    Presented with  "My pancrease and diabetes are acting up". Presents with upper abd pain and N/v/D His BG has been running above 200 Abdominal pain severe cramp like similar to prior pancreatitis.  Reports some blood in stool. Hx of Hemorroids Worse with eating Started drinking again he drinks between 4-5 hard mikes lemonade's per day when he does not drink he starts getting the shakes by the morning sometimes earlier no history of seizures  He smokes Have been taking metformin for Diabetes was just diagnosed in Sep 2021 Reports he had some diarrhea initially but had a normal BM today  Infectious risk factors:  Reports  N/V/Diarrhea/abdominal pain,    Has   been vaccinated against COVID last dose in JUly 2021   Initial COVID TEST  NEGATIVE   Lab Results  Component Value Date   SARSCOV2NAA NEGATIVE 02/09/2020   SARSCOV2NAA NEGATIVE 01/20/2019   SARSCOV2NAA NEGATIVE 01/06/2019   Regarding pertinent Chronic problems:      HTN on NOrvasc  GERD on PPI     DM 2 -  diet controlled      obesity-   BMI Readings from Last 1 Encounters:  02/09/20 36.26 kg/m      COPD - not  followed by pulmonology  not  on baseline oxygen   While in ER: Potassium 3.1 AST up to 90 MCV 103.3  CT abd showing Cirrhosis but non acute    Hospitalist was called for admission for acute on chronic pancreatitis  The following Work up has been ordered so far:  Orders Placed This Encounter  Procedures  .  Respiratory Panel by RT PCR (Flu A&B, Covid) - Nasopharyngeal Swab  . CT ABDOMEN PELVIS W CONTRAST  . Lipase, blood  . Comprehensive metabolic panel  . CBC  . Urinalysis, Complete w Microscopic  . Diet NPO time specified  . Fluid/PO Challenge  . Patient may eat/drink  . Consult to hospitalist  ALL PATIENTS BEING ADMITTED/HAVING PROCEDURES NEED COVID-19 SCREENING  . Consult to hospitalist  ALL PATIENTS BEING ADMITTED/HAVING PROCEDURES NEED COVID-19 SCREENING  . CBG monitoring, ED  . ED EKG  . EKG 12-Lead     Following Medications were ordered in ER: Medications  HYDROmorphone (DILAUDID) injection 1 mg (1 mg Intravenous Given 02/09/20 1943)  ondansetron (ZOFRAN) injection 4 mg (4 mg Intravenous Given 02/09/20 1944)  sodium chloride 0.9 % bolus 1,000 mL (1,000 mLs Intravenous New Bag/Given 02/09/20 1944)  iohexol (OMNIPAQUE) 300 MG/ML solution 100 mL (100 mLs Intravenous Contrast Given 02/09/20 2016)  HYDROmorphone (DILAUDID) injection 1 mg (1 mg Intravenous Given 02/09/20 2035)  droperidol (INAPSINE) 2.5 MG/ML injection 2.5 mg (2.5 mg Intravenous Given 02/09/20 2113)  HYDROmorphone (DILAUDID) injection 1 mg (1 mg Intravenous Given 02/09/20 2247)        Consult Orders  (From admission, onward)         Start     Ordered   02/09/20 2243  Consult to hospitalist  ALL PATIENTS BEING ADMITTED/HAVING PROCEDURES NEED COVID-19 SCREENING  Once       Comments: ALL PATIENTS BEING ADMITTED/HAVING PROCEDURES NEED COVID-19 SCREENING  Provider:  (Not yet assigned)  Question Answer Comment  Place call to: Hospitalist   Reason for Consult Admit      02/09/20 2242   02/09/20 2242  Consult to hospitalist  ALL PATIENTS BEING ADMITTED/HAVING PROCEDURES NEED COVID-19 SCREENING  Once       Comments: ALL PATIENTS BEING ADMITTED/HAVING PROCEDURES NEED COVID-19 SCREENING  Provider:  (Not yet assigned)  Question Answer Comment  Place call to: Hospitalist   Reason for Consult Admit      02/09/20  2241           Significant initial  Findings: Abnormal Labs Reviewed  COMPREHENSIVE METABOLIC PANEL - Abnormal; Notable for the following components:      Result Value   Potassium 3.1 (*)    BUN <5 (*)    Calcium 8.5 (*)    AST 90 (*)    All other components within normal limits  CBC - Abnormal; Notable for the following components:   MCV 103.3 (*)    MCH 35.9 (*)    All other components within normal limits  URINALYSIS, COMPLETE (UACMP) WITH MICROSCOPIC - Abnormal; Notable for the following components:   Color, Urine YELLOW (*)    APPearance CLEAR (*)    Glucose, UA 50 (*)    Protein, ur 30 (*)    All other components within normal limits  CBG MONITORING, ED - Abnormal; Notable for the following components:   Glucose-Capillary 103 (*)    All other components within normal limits    Otherwise labs showing:    Recent Labs  Lab 02/09/20 1707  NA 138  K 3.1*  CO2 26  GLUCOSE 99  BUN <5*  CREATININE 0.74  CALCIUM 8.5*    Cr   stable,    Lab Results  Component Value Date   CREATININE 0.74 02/09/2020   CREATININE 0.68 11/05/2019   CREATININE 0.87 09/08/2019    Recent Labs  Lab 02/09/20 1707  AST 90*  ALT 42  ALKPHOS 101  BILITOT 1.0  PROT 7.7  ALBUMIN 3.6   Lab Results  Component Value Date   CALCIUM 8.5 (L) 02/09/2020   PHOS 3.1 01/20/2019   WBC      Component Value Date/Time   WBC 9.2 02/09/2020 1707   LYMPHSABS 3.2 10/29/2018 0044   MONOABS 0.4 10/29/2018 0044   EOSABS 0.2 10/29/2018 0044   BASOSABS 0.1 10/29/2018 0044     Plt: Lab Results  Component Value Date   PLT 181 02/09/2020      HG/HCT   stable,       Component Value Date/Time   HGB 15.2 02/09/2020 1707   HCT 43.7 02/09/2020 1707   MCV 103.3 (H) 02/09/2020 1707    Recent Labs  Lab 02/09/20 1707  LIPASE 41      ECG: Ordered Personally reviewed by me showing: HR : 85 Rhythm:  NSR   no evidence of ischemic changes QTC 480   CBG (last 3)  Recent Labs     02/09/20 1705  GLUCAP 103*     UA   no evidence of UTI      Urine analysis:    Component Value Date/Time   COLORURINE YELLOW (A) 02/09/2020 1707   APPEARANCEUR CLEAR (A) 02/09/2020 1707   LABSPEC 1.016 02/09/2020 1707   PHURINE 6.0  02/09/2020 1707   GLUCOSEU 50 (A) 02/09/2020 1707   HGBUR NEGATIVE 02/09/2020 1707   BILIRUBINUR NEGATIVE 02/09/2020 1707   KETONESUR NEGATIVE 02/09/2020 1707   PROTEINUR 30 (A) 02/09/2020 1707   NITRITE NEGATIVE 02/09/2020 1707   LEUKOCYTESUR NEGATIVE 02/09/2020 1707    Ordered    CTabd/pelvis -  Non-acute portal venous hypertension with portal venous collaterals.  underlying changes of cirrhosis    ED Triage Vitals [02/09/20 1705]  Enc Vitals Group     BP 135/75     Pulse Rate 98     Resp 17     Temp 98.6 F (37 C)     Temp Source Oral     SpO2 96 %     Weight 260 lb (117.9 kg)     Height  (1.803 m)     Head Circumference      Peak Flow      Pain Score 9     Pain Loc      Pain Edu?      Excl. in GC?   WUJW(11)@       Latest  Blood pressure 115/78, pulse 90, temperature 98.6 F (37 C), temperature source Oral, resp. rate 12, height  (1.803 m), weight 117.9 kg, SpO2 94 %.     Review of Systems:    Pertinent positives include:  fatigue,  abdominal pain, nausea, vomiting, diarrhea, Constitutional:  No weight loss, night sweats, Fevers, chills, weight loss  HEENT:  No headaches, Difficulty swallowing,Tooth/dental problems,Sore throat,  No sneezing, itching, ear ache, nasal congestion, post nasal drip,  Cardio-vascular:  No chest pain, Orthopnea, PND, anasarca, dizziness, palpitations.no Bilateral lower extremity swelling  GI:  No heartburn, indigestion, change in bowel habits, loss of appetite, melena, blood in stool, hematemesis Resp:  no shortness of breath at rest. No dyspnea on exertion, No excess mucus, no productive cough, No non-productive cough, No coughing up of blood.No change in color of mucus.No  wheezing. Skin:  no rash or lesions. No jaundice GU:  no dysuria, change in color of urine, no urgency or frequency. No straining to urinate.  No flank pain.  Musculoskeletal:  No joint pain or no joint swelling. No decreased range of motion. No back pain.  Psych:  No change in mood or affect. No depression or anxiety. No memory loss.  Neuro: no localizing neurological complaints, no tingling, no weakness, no double vision, no gait abnormality, no slurred speech, no confusion  All systems reviewed and apart from HOPI all are negative  Past Medical History:   Past Medical History:  Diagnosis Date  . Alcohol dependence (HCC)   . COPD (chronic obstructive pulmonary disease) (HCC)   . Diabetes mellitus without complication (HCC)   . GERD (gastroesophageal reflux disease)   . Hypertension   . Pancreatitis   . Pancreatitis      Past Surgical History:  Procedure Laterality Date  . ABDOMINAL SURGERY      Social History:  Ambulatory   Independently      reports that he has been smoking cigarettes. He has been smoking about 1.00 pack per day. He has never used smokeless tobacco. He reports current alcohol use. He reports previous drug use. Drugs: Marijuana and Cocaine.   Family History:   Family History  Problem Relation Age of Onset  . Diabetes Other     Allergies: No Known Allergies   Prior to Admission medications   Medication Sig Start Date End Date Taking? Authorizing Provider  amLODipine (NORVASC) 10 MG tablet Take 10 mg by mouth daily.    [provider]  famotidine (PEPCID) 20 MG tablet Take 1 tablet (20 mg total) by mouth 2 (two) times daily. Patient not taking: Reported on 01/06/2019 10/29/18   Irean Hong, MD  hydrOXYzine (ATARAX/VISTARIL) 25 MG tablet Take 25 mg by mouth every 4 (four) hours as needed.    [provider]  methocarbamol (ROBAXIN) 500 MG tablet Take 1 tablet (500 mg total) by mouth every 6 (six) hours as needed for muscle  spasms. 09/08/19   Enid Derry, PA-C  oxyCODONE-acetaminophen (PERCOCET/ROXICET) 5-325 MG tablet Take 1 tablet by mouth every 4 (four) hours as needed for severe pain. Patient not taking: Reported on 01/06/2019 12/19/18   Irean Hong, MD  pantoprazole (PROTONIX) 20 MG tablet Take 1 tablet (20 mg total) by mouth daily. Patient not taking: Reported on 01/06/2019 11/20/18 11/20/19  Willy Eddy, MD  promethazine (PHENERGAN) 12.5 MG tablet Take 1 tablet (12.5 mg total) by mouth every 6 (six) hours as needed. Patient not taking: Reported on 01/06/2019 11/20/18   Willy Eddy, MD  sucralfate (CARAFATE) 1 g tablet Take 1 tablet (1 g total) by mouth 4 (four) times daily as needed (for abdominal discomfort, nausea, and/or vomiting). 01/14/19   Loleta Rose, MD   Physical Exam: Vitals with BMI 02/09/2020 02/09/2020 02/09/2020  Height - - -  Weight - - -  BMI - - -  Systolic 115 135 798  Diastolic 78 87 89  Pulse 90 87 86    1. General:  in No  Acute distress    Chronically ill -appearing 2. Psychological: Alert and  Oriented 3. Head/ENT:    Dry Mucous Membranes                          Head Non traumatic, neck supple                            Poor Dentition 4. SKIN:  decreased Skin turgor,  Skin clean Dry and intact no rash 5. Heart: Regular rate and rhythm no  Murmur, no Rub or gallop 6. Lungs: no wheezes or crackles   7. Abdomen: generalized tenderness,   distended  obese   8. Lower extremities: no clubbing, cyanosis, no  edema 9. Neurologically Grossly intact, moving all 4 extremities equally  tremoulous 10. MSK: Normal range of motion   All other LABS:     Recent Labs  Lab 02/09/20 1707  WBC 9.2  HGB 15.2  HCT 43.7  MCV 103.3*  PLT 181     Recent Labs  Lab 02/09/20 1707  NA 138  K 3.1*  CL 102  CO2 26  GLUCOSE 99  BUN <5*  CREATININE 0.74  CALCIUM 8.5*     Recent Labs  Lab 02/09/20 1707  AST 90*  ALT 42  ALKPHOS 101  BILITOT 1.0  PROT 7.7   ALBUMIN 3.6      Cultures: No results found for: SDES, SPECREQUEST, CULT, REPTSTATUS   Radiological Exams on Admission: CT ABDOMEN PELVIS W CONTRAST  Result Date: 02/09/2020 CLINICAL DATA:  Abdominal pain. EXAM: CT ABDOMEN AND PELVIS WITH CONTRAST TECHNIQUE: Multidetector CT imaging of the abdomen and pelvis was performed using the standard protocol following bolus administration of intravenous contrast. CONTRAST:  OMNIPAQUE IOHEXOL 300 MG/ML  SOLN COMPARISON:  CT scan 09/08/2019 FINDINGS: Lower chest: Insert  lung bases Hepatobiliary: Patient had severe fatty infiltration of the liver on the prior CT. This is not as evident today but there are some geographic areas of residual fatty infiltration without definite hepatic lesions or intrahepatic biliary dilatation. Progressive changes of portal venous hypertension with portal venous collaterals more obvious on today's study. The portal and splenic veins are patent. Suspect underlying changes of cirrhosis with prominent hepatic fissures. The gallbladder is unremarkable. No common bile duct dilatation. Pancreas: No mass, inflammation or ductal dilatation. Spleen: Normal size.  No focal lesions. Adrenals/Urinary Tract: The adrenal glands and kidneys are unremarkable. The bladder is normal. Stomach/Bowel: The stomach, duodenum, small bowel and colon are grossly normal without oral contrast. No acute inflammatory changes, mass lesions or obstructive findings. The terminal ileum and appendix are normal. Vascular/Lymphatic: Scattered vascular calcifications but no aneurysm or dissection. The branch vessels are patent. The major venous structures are patent. Small scattered mesenteric and retroperitoneal lymph nodes but no mass or overt adenopathy. Reproductive: The prostate gland and seminal vesicles are unremarkable. Other: No pelvic mass or adenopathy. Small amount of free pelvic fluid is noted. No inguinal mass or adenopathy. No abdominal wall hernia or  subcutaneous lesions. Musculoskeletal: No significant bony findings. IMPRESSION: 1. No acute abdominal/pelvic findings, mass lesions or adenopathy. 2. Progressive changes of portal venous hypertension with portal venous collaterals. 3. Suspect underlying changes of cirrhosis. 4. Previous CT with marked fatty infiltration of the liver. I think there are some areas of residual geographic fatty infiltration but no obvious hepatic mass lesions. MRI abdomen without and with contrast may be helpful for further evaluation. 5. Aortic atherosclerosis. Aortic Atherosclerosis (ICD10-I70.0). Electronically Signed   By: Rudie Meyer M.D.   On: 02/09/2020 20:39    Chart has been reviewed   Assessment/Plan  49 y.o. male with medical history significant of EtOH abuse, GERD, HTN, chronic pancreatitis, DM2, COPD    Admitted for abdominal pain likely due acute on chronic pancreatitis  Present on Admission: . Acute on chronic pancreatitis (HCC) Keep NPO, rehydrate, discusse importance of quiting ETOH CT abd non acute, evidence of cirrhosis Lipase within normal limits that could be in the setting of chronic pancreatitis Other possibilities include alcoholic gastritis Versus alcoholic hepatitis LFTs only mildly elevated making it somewhat less likely . ETOH abuse -counseled about abstinence, order CIWA  Tobacco abuse -  - Spoke about importance of quitting spent 5 minutes discussing options for treatment, prior attempts at quitting, and dangers of smoking  -At this point patient is   interested in quitting  - order nicotine patch   - nursing tobacco cessation protocol   . Hemorrhoids -likely causing small bout of blood on the tissue Most likely related to portal hypertension  . Portal hypertension (HCC) -most likely secondary to alcoholic cirrhosis, will need evaluation for varices as an outpatient  . Alcoholic cirrhosis of liver without ascites (HCC) -will need to abstain from alcohol and will need to get  set up with GI follow-up.,  Check INR for completion obtain hepatitis panel  . Hypokalemia - - will replace and repeat in AM,  check magnesium level and replace as needed    DM2 -   Order Sensitive  SSI   -  check TSH and HgA1C    Other plan as per orders.  COPD - continue home meds, chronic problem  DVT prophylaxis:  SCD       Code Status:    Code Status: Prior FULL CODE  as per patient  I had personally discussed CODE STATUS with patient    Family Communication:   Family not at  Bedside    Disposition Plan:      To home once workup is complete and patient is stable   Following barriers for discharge:                            Electrolytes corrected                             Pain controlled with PO medications                                                          Will need consultants to evaluate patient prior to discharge                                      Transition of care consulted                                   Consults called:    sent a msg to Dr.  Norma Fredricksonoledo with GI  Admission status:  ED Disposition    ED Disposition Condition Comment   Admit  [facility] [[id number]]  Level of Care: Med-Surg [16]  Covid Evaluation: Confirmed COVID Negative  Diagnosis: Acute on chronic pancreatitis [facility](HCC) [[id number]]) [[id number]]  Admitting Physician: Therisa DoyneUTOVA, Deangelo Berns [[id number]]  Attending Physician: Therisa DoyneUTOVA, Camry Robello [[id number]]  Estimated length of stay: past midnight tomorrow  Certification:: I certify this patient will need inpatient services for at least 2 midnights          inpatient     I Expect 2 midnight stay secondary to severity of patient's current illness need for inpatient interventions justified by the following:     Severe lab/radiological/exam abnormalities including:    elevated LFT and extensive comorbidities including:  substance abuse   Chronic pain  DM2  COPD/asthma  liver disease   That are currently affecting medical  management.   I expect  patient to be hospitalized for 2 midnights requiring inpatient medical care.  Patient is at high risk for adverse outcome (such as loss of life or disability) if not treated.  Indication for inpatient stay as follows:     severe pain requiring acute inpatient management,  inability to maintain oral hydration    Need for   IV fluids, IV pain medications,    Level of care    tele  For 12H     Lab Results  Component Value Date   SARSCOV2NAA NEGATIVE 02/09/2020     Precautions: admitted as  Covid Negative   PPE: Used by the provider:   P100  eye Goggles,  Gloves     Jaylie Neaves 02/10/2020, 12:45 AM    [facility]     after 2 AM please page floor coverage PA If 7AM-7PM, please contact the day team taking care of the patient using [email or web address]   Patient was evaluated in the context of the global COVID-19 pandemic, which necessitated consideration that the patient might be  at risk for infection with the SARS-CoV-2 virus that causes COVID-19. Institutional protocols and algorithms that pertain to the evaluation of patients at risk for COVID-19 are in a state of rapid change based on information released by regulatory bodies including the CDC and federal and state organizations. These policies and algorithms were followed during the patient's care.

## 2020-02-09 NOTE — ED Triage Notes (Signed)
Pt states "my pancreas and diabetes it acting up". Pt c/o upper abd pain with N/V/D and CBG >200 for the past week., pt ambulatory to triage with a steady gait.

## 2020-02-10 ENCOUNTER — Encounter: Payer: Self-pay | Admitting: Internal Medicine

## 2020-02-10 DIAGNOSIS — Z720 Tobacco use: Secondary | ICD-10-CM | POA: Diagnosis present

## 2020-02-10 LAB — HEMOGLOBIN A1C
Hgb A1c MFr Bld: 6.4 % — ABNORMAL HIGH (ref 4.8–5.6)
Mean Plasma Glucose: 136.98 mg/dL

## 2020-02-10 LAB — MAGNESIUM
Magnesium: 1.6 mg/dL — ABNORMAL LOW (ref 1.7–2.4)
Magnesium: 1.6 mg/dL — ABNORMAL LOW (ref 1.7–2.4)

## 2020-02-10 LAB — CBC WITH DIFFERENTIAL/PLATELET
Abs Immature Granulocytes: 0.04 10*3/uL (ref 0.00–0.07)
Basophils Absolute: 0.1 10*3/uL (ref 0.0–0.1)
Basophils Relative: 1 %
Eosinophils Absolute: 0.1 10*3/uL (ref 0.0–0.5)
Eosinophils Relative: 1 %
HCT: 44.2 % (ref 39.0–52.0)
Hemoglobin: 15.1 g/dL (ref 13.0–17.0)
Immature Granulocytes: 1 %
Lymphocytes Relative: 32 %
Lymphs Abs: 2.7 10*3/uL (ref 0.7–4.0)
MCH: 35.6 pg — ABNORMAL HIGH (ref 26.0–34.0)
MCHC: 34.2 g/dL (ref 30.0–36.0)
MCV: 104.2 fL — ABNORMAL HIGH (ref 80.0–100.0)
Monocytes Absolute: 0.5 10*3/uL (ref 0.1–1.0)
Monocytes Relative: 6 %
Neutro Abs: 5.1 10*3/uL (ref 1.7–7.7)
Neutrophils Relative %: 59 %
Platelets: 167 10*3/uL (ref 150–400)
RBC: 4.24 MIL/uL (ref 4.22–5.81)
RDW: 14.8 % (ref 11.5–15.5)
WBC: 8.6 10*3/uL (ref 4.0–10.5)
nRBC: 0 % (ref 0.0–0.2)

## 2020-02-10 LAB — PHOSPHORUS
Phosphorus: 3.7 mg/dL (ref 2.5–4.6)
Phosphorus: 3.8 mg/dL (ref 2.5–4.6)

## 2020-02-10 LAB — PROTIME-INR
INR: 1.3 — ABNORMAL HIGH (ref 0.8–1.2)
Prothrombin Time: 15.3 seconds — ABNORMAL HIGH (ref 11.4–15.2)

## 2020-02-10 LAB — COMPREHENSIVE METABOLIC PANEL
ALT: 41 U/L (ref 0–44)
AST: 86 U/L — ABNORMAL HIGH (ref 15–41)
Albumin: 3.5 g/dL (ref 3.5–5.0)
Alkaline Phosphatase: 104 U/L (ref 38–126)
Anion gap: 11 (ref 5–15)
BUN: <5 mg/dL — ABNORMAL LOW (ref 6–20)
CO2: 25 mmol/L (ref 22–32)
Calcium: 8 mg/dL — ABNORMAL LOW (ref 8.9–10.3)
Chloride: 102 mmol/L (ref 98–111)
Creatinine, Ser: 0.63 mg/dL (ref 0.61–1.24)
GFR, Estimated: >60 mL/min (ref >60)
Glucose, Bld: 109 mg/dL — ABNORMAL HIGH (ref 70–99)
Potassium: 3.5 mmol/L (ref 3.5–5.1)
Sodium: 138 mmol/L (ref 135–145)
Total Bilirubin: 1.7 mg/dL — ABNORMAL HIGH (ref 0.3–1.2)
Total Protein: 7.7 g/dL (ref 6.5–8.1)

## 2020-02-10 LAB — GLUCOSE, CAPILLARY
Glucose-Capillary: 100 mg/dL — ABNORMAL HIGH (ref 70–99)
Glucose-Capillary: 104 mg/dL — ABNORMAL HIGH (ref 70–99)
Glucose-Capillary: 109 mg/dL — ABNORMAL HIGH (ref 70–99)
Glucose-Capillary: 111 mg/dL — ABNORMAL HIGH (ref 70–99)
Glucose-Capillary: 122 mg/dL — ABNORMAL HIGH (ref 70–99)
Glucose-Capillary: 128 mg/dL — ABNORMAL HIGH (ref 70–99)
Glucose-Capillary: 94 mg/dL (ref 70–99)

## 2020-02-10 LAB — TSH: TSH: 14.71 u[IU]/mL — ABNORMAL HIGH (ref 0.350–4.500)

## 2020-02-10 LAB — FOLATE: Folate: 1.7 ng/mL — ABNORMAL LOW (ref >5.9)

## 2020-02-10 LAB — VITAMIN B12: Vitamin B-12: 262 pg/mL (ref 180–914)

## 2020-02-10 LAB — AMMONIA: Ammonia: 66 umol/L — ABNORMAL HIGH (ref 9–35)

## 2020-02-10 LAB — CK: Total CK: 145 U/L (ref 49–397)

## 2020-02-10 LAB — T4, FREE: Free T4: 0.73 ng/dL (ref 0.61–1.12)

## 2020-02-10 MED ORDER — NICOTINE 21 MG/24HR TD PT24
21.0000 mg | MEDICATED_PATCH | Freq: Every day | TRANSDERMAL | Status: DC
  Administered 2020-02-10 – 2020-02-13 (×5): 21 mg via TRANSDERMAL
  Filled 2020-02-10 (×5): qty 1

## 2020-02-10 MED ORDER — ONDANSETRON HCL 4 MG/2ML IJ SOLN
4.0000 mg | Freq: Four times a day (QID) | INTRAMUSCULAR | Status: DC | PRN
  Administered 2020-02-10: 4 mg via INTRAVENOUS
  Filled 2020-02-10: qty 2

## 2020-02-10 MED ORDER — VITAMIN B-12 1000 MCG PO TABS
1000.0000 ug | ORAL_TABLET | Freq: Every day | ORAL | Status: DC
  Administered 2020-02-10 – 2020-02-13 (×4): 1000 ug via ORAL
  Filled 2020-02-10 (×4): qty 1

## 2020-02-10 MED ORDER — LISINOPRIL 20 MG PO TABS
20.0000 mg | ORAL_TABLET | Freq: Every day | ORAL | Status: DC
  Administered 2020-02-10 – 2020-02-13 (×4): 20 mg via ORAL
  Filled 2020-02-10 (×4): qty 1

## 2020-02-10 MED ORDER — LEVOTHYROXINE SODIUM 50 MCG PO TABS
125.0000 ug | ORAL_TABLET | Freq: Every day | ORAL | Status: DC
  Administered 2020-02-11 – 2020-02-13 (×3): 125 ug via ORAL
  Filled 2020-02-10 (×3): qty 1

## 2020-02-10 MED ORDER — ONDANSETRON HCL 4 MG PO TABS: 4.0000 mg | ORAL_TABLET | Freq: Four times a day (QID) | ORAL | Status: DC | PRN

## 2020-02-10 MED ORDER — ALBUTEROL SULFATE (2.5 MG/3ML) 0.083% IN NEBU
2.5000 mg | INHALATION_SOLUTION | RESPIRATORY_TRACT | Status: DC | PRN
  Administered 2020-02-10 – 2020-02-13 (×3): 2.5 mg via RESPIRATORY_TRACT
  Filled 2020-02-10 (×3): qty 3

## 2020-02-10 MED ORDER — AMLODIPINE BESYLATE 10 MG PO TABS
10.0000 mg | ORAL_TABLET | Freq: Every day | ORAL | Status: DC
  Administered 2020-02-10 – 2020-02-13 (×4): 10 mg via ORAL
  Filled 2020-02-10 (×4): qty 1

## 2020-02-10 MED ORDER — KETOROLAC TROMETHAMINE 30 MG/ML IJ SOLN
INTRAMUSCULAR | Status: AC
  Administered 2020-02-10: 30 mg via INTRAVENOUS
  Filled 2020-02-10: qty 1

## 2020-02-10 MED ORDER — LABETALOL HCL 5 MG/ML IV SOLN
10.0000 mg | INTRAVENOUS | Status: DC | PRN
  Administered 2020-02-10: 10 mg via INTRAVENOUS
  Filled 2020-02-10: qty 4

## 2020-02-10 MED ORDER — HYDROMORPHONE HCL 1 MG/ML IJ SOLN
1.0000 mg | INTRAMUSCULAR | Status: DC | PRN
  Administered 2020-02-10 – 2020-02-11 (×7): 1 mg via INTRAVENOUS
  Filled 2020-02-10 (×7): qty 1

## 2020-02-10 MED ORDER — KETOROLAC TROMETHAMINE 30 MG/ML IJ SOLN: 30.0000 mg | Freq: Once | INTRAMUSCULAR | Status: AC

## 2020-02-10 MED ORDER — INFLUENZA VAC SPLIT QUAD 0.5 ML IM SUSY
0.5000 mL | PREFILLED_SYRINGE | INTRAMUSCULAR | Status: AC
  Administered 2020-02-13: 0.5 mL via INTRAMUSCULAR

## 2020-02-10 MED ORDER — LISINOPRIL 10 MG PO TABS: 10.0000 mg | ORAL_TABLET | Freq: Every day | ORAL | Status: DC

## 2020-02-10 MED ORDER — MAGNESIUM SULFATE 2 GM/50ML IV SOLN
2.0000 g | Freq: Once | INTRAVENOUS | Status: AC
  Administered 2020-02-10: 2 g via INTRAVENOUS
  Filled 2020-02-10: qty 50

## 2020-02-10 MED ORDER — HYDROMORPHONE HCL 1 MG/ML IJ SOLN
INTRAMUSCULAR | Status: AC
  Administered 2020-02-10: 1 mg via INTRAVENOUS
  Filled 2020-02-10: qty 1

## 2020-02-10 NOTE — Consult Note (Signed)
GI Inpatient Consult Note  Reason for Consult: Epigastric abdominal pain with nausea and vomiting, portal hypertension, alcoholic cirrhosis    Attending Requesting Consult: Dr. Lorella Nimrod  History of Present Illness: Kristopher [name] is a 49 y.o. male seen for evaluation of acute on chronic pancreatitis at the request of Dr. Reesa Chew. Pt has a PMH of Hx of pancreatitis, HTN, T2DM, COPD, EtOH abuse, and GERD. He presented to the [facility] ED yesterday evening for one-week history of progressive epigastric abdominal pain with associated nausea and vomiting. He reports pain level has been very consistent with previous episodes of "pancreatitis." Upon presentation to the ED, he had labs showing WBC 9.2K, hgb 15.2, MCV 103.3, Plt 181K, K 3.1, AST 90, ALT 42, alk phos 101, tbili 1.0, lipase 41. CT abd/pelvis with contrast showed no acute abdominopelvic findings, mass lesions, or adenopathy. There was mention of progressive changes of portal venous hypertension with portal venous collaterals with suspected underlying changes of cirrhosis. Pancreas appeared normal with no mass, inflammation, or ductal dilatation. Admitting hospitalist diagnosed him with acute on chronic pancreatitis. He was admitted to the floor and made NPO and given IV fluid hydration with pain control. Patient reports today he continues to be in significant pain. He ate clear liquids for lunch today and reports some nausea after eating this. No episodes of emesis since yesterday in the ED. He reports pain level is currently 8/10 in severity. He reports he had stopped drinking alcohol for several months, but recently started back drinking. He reports he drinks 4-5 [employer] Hard Lemonade (24 ounces each) on a daily basis. Of note, he was admitted 11/03 - 01/30/20 at [facility] for very similar symptoms where he had serum lipase only mildly elevated on presentation.CT at the time with contrast at [facility] that did not show any evidence of chronic pancreatitis.  EGD while inpatient at that time showed erosive gastropathy and erythematous duodenopathy (presumed to be alcoholic) without any stigmata of recent bleeding. Colonoscopy showed a few small mouthed diverticula, but otherwise normal. He was treated with PPI and Carafate.    Past Medical History:  Past Medical History:  Diagnosis Date  . Alcohol dependence ([place])   . COPD (chronic obstructive pulmonary disease) ([place])   . Diabetes mellitus without complication ([place])   . GERD (gastroesophageal reflux disease)   . Hypertension   . Pancreatitis   . Pancreatitis     Problem List: Patient Active Problem List   Diagnosis Date Noted  . Tobacco abuse 02/10/2020  . Acute on chronic pancreatitis ([place]) 02/09/2020  . ETOH abuse 02/09/2020  . Hemorrhoids 02/09/2020  . Portal hypertension ([place]) 02/09/2020  . Alcoholic cirrhosis of liver without ascites ([place]) 02/09/2020  . Hypokalemia 02/09/2020  . Abdominal pain 01/21/2019  . Intractable nausea and vomiting 01/20/2019  . Acute alcoholic pancreatitis 34/28/7681    Past Surgical History: Past Surgical History:  Procedure Laterality Date  . ABDOMINAL SURGERY      Allergies: No Known Allergies  Home Medications: Medications Prior to Admission  Medication Sig Dispense Refill Last Dose  . albuterol (VENTOLIN HFA) 108 (90 Base) MCG/ACT inhaler Inhale 2 puffs into the lungs every 6 (six) hours as needed for wheezing or shortness of breath.   prn at prn  . amLODipine (NORVASC) 10 MG tablet Take 10 mg by mouth daily.   Past Week at Unknown time  . fluticasone (FLONASE) 50 MCG/ACT nasal spray Place 1 spray into both nostrils daily.   Past Week at Unknown time  .  Fluticasone-Salmeterol (ADVAIR) 500-50 MCG/DOSE AEPB Inhale 1 puff into the lungs 2 (two) times daily.   Past Week at Unknown time  . gabapentin (NEURONTIN) 300 MG capsule Take 300 mg by mouth 3 (three) times daily.   Past Week at Unknown time  . lisinopril (ZESTRIL) 10 MG tablet Take 10 mg by  mouth daily.   Past Week at Unknown time  . melatonin 3 MG TABS tablet Take 3 mg by mouth at bedtime as needed (sleep).   prn at prn  . metFORMIN (GLUCOPHAGE) 500 MG tablet Take 500 mg by mouth 2 (two) times daily with a meal.   Past Week at Unknown time  . pantoprazole (PROTONIX) 40 MG tablet Take 40 mg by mouth daily.   Past Week at Unknown time   Home medication reconciliation was completed with the patient.   Scheduled Inpatient Medications:   . amLODipine  10 mg Oral Daily  . folic acid  1 mg Oral Daily  . [START ON 02/11/2020] influenza vac split quadrivalent PF  0.5 mL Intramuscular Tomorrow-1000  . insulin aspart  0-9 Units Subcutaneous Q4H  . levothyroxine  125 mcg Oral Q0600  . lisinopril  20 mg Oral Daily  . multivitamin with minerals  1 tablet Oral Daily  . nicotine  21 mg Transdermal Daily  . pantoprazole (PROTONIX) IV  40 mg Intravenous QHS  . sucralfate  1 g Oral TID WC & HS  . thiamine  100 mg Oral Daily   Or  . thiamine  100 mg Intravenous Daily  . vitamin B-12  1,000 mcg Oral Daily    Continuous Inpatient Infusions:     PRN Inpatient Medications:  albuterol, HYDROmorphone (DILAUDID) injection, labetalol, LORazepam **OR** LORazepam, ondansetron **OR** ondansetron (ZOFRAN) IV  Family History: family history includes Diabetes in an other family member.  The patient's family history is negative for inflammatory bowel disorders, GI malignancy, or solid organ transplantation.  Social History:   reports that he has been smoking cigarettes. He has been smoking about 1.00 pack per day. He has never used smokeless tobacco. He reports current alcohol use. He reports previous drug use. Drugs: Marijuana and Cocaine. The patient denies ETOH, tobacco, or drug use.   Review of Systems: Constitutional: Weight is stable.  Eyes: No changes in vision. ENT: No oral lesions, sore throat.  GI: see HPI.  Heme/Lymph: No easy bruising.  CV: No chest pain.  GU: No hematuria.   Integumentary: No rashes.  Neuro: No headaches.  Psych: No depression/anxiety.  Endocrine: No heat/cold intolerance.  Allergic/Immunologic: No urticaria.  Resp: No cough, SOB.  Musculoskeletal: No joint swelling.    Physical Examination: BP (!) 159/103 (BP Location: Left Arm)   Pulse 72   Temp 98.5 F (36.9 C) (Oral)   Resp 16   Ht 5\' 11"  (1.803 m)   Wt 117.9 kg   SpO2 95%   BMI 36.26 kg/m  Gen: NAD, alert and oriented x 4 HEENT: PEERLA, EOMI, Neck: supple, no JVD or thyromegaly Chest: CTA bilaterally, no wheezes, crackles, or other adventitious sounds CV: RRR, no m/g/c/r Abd: mildly distended abdomen, hypoactive BS in all four quadrants; tender to light and deep palpation in epigastric and LUQ, no HSM, guarding, ridigity, or rebound tenderness Ext: no edema, well perfused with 2+ pulses, Skin: no rash or lesions noted Lymph: no LAD  Data: Lab Results  Component Value Date   WBC 8.6 02/10/2020   HGB 15.1 02/10/2020   HCT 44.2 02/10/2020   MCV  104.2 (H) 02/10/2020   PLT 167 02/10/2020   Recent Labs  Lab 02/09/20 1707 02/10/20 0303  HGB 15.2 15.1   Lab Results  Component Value Date   NA 138 02/10/2020   K 3.5 02/10/2020   CL 102 02/10/2020   CO2 25 02/10/2020   BUN <5 (L) 02/10/2020   CREATININE 0.63 02/10/2020   Lab Results  Component Value Date   ALT 41 02/10/2020   AST 86 (H) 02/10/2020   ALKPHOS 104 02/10/2020   BILITOT 1.7 (H) 02/10/2020   Recent Labs  Lab 02/10/20 0303  INR 1.3*   CT abd/pelvis with contrast 02/09/2020: IMPRESSION: 1. No acute abdominal/pelvic findings, mass lesions or adenopathy. 2. Progressive changes of portal venous hypertension with portal venous collaterals. 3. Suspect underlying changes of cirrhosis. 4. Previous CT with marked fatty infiltration of the liver. I think there are some areas of residual geographic fatty infiltration but no obvious hepatic mass lesions. MRI abdomen without and with contrast may be  helpful for further evaluation. 5. Aortic atherosclerosis.  Assessment/Plan:  49 y/o male with a PMH of COPD, diabetes mellitus, EtOH abuse, and Hx of pancreatitis presented to the [facility] ED yesterday evening for 1-week history of epigastric abdominal pain with nausea and vomiting   1. Epigastric abdominal pain with non-intractable nausea/vomiting - no laboratory or radiographic evidence of acute or chronic pancreatitis. No leukocytosis or significant elevation in pancreatic enzymes. Most likely cause of his epigastric abd pain is alcoholic gastritis/duodenitis versus peptic ulcer disease. Differential also includes chronic abdominal wall pain, esophagitis, functional dyspepsia, gastroparesis, biliary dyskinesia, functional, MSK, etc  2. Alcoholic cirrhosis of the liver without ascites with findings of portal hypertension - new finding. Previous CT scans without findings of cirrhosis.   3. Alcohol abuse - counseled on importance of cessation  4. Hemorrhoids - likely the cause of his anal outlet bleeding  5. Hypokalemia/Hypomagnesemia - replaced  6. Tobacco abuse  7. Macrocytosis - 104, likely 2/2 cirrhosis and underlying folate def  Recommendations:  - No evidence of acute or chronic pancreatitis - Acid suppression therapy twice daily - Advance diet as tolerated. Bland foods advised. - Can add carafate 1g TID AC - Discussed pathophysiology, diagnosis, symptoms, and mainstay of preventing complications of cirrhosis. - Endoscopy when clinically feasible, possibly Friday - Continue serial abdominal examinations with pain control - Discussed importance of complete alcohol and tobacco cessation - No signs of overt gastrointestinal blood loss - No signs of hepatic encephalopathy on exam - Following   Thank you for the consult. Please call with questions or concerns.  Reeves Forth [facility] [phone] (Cell)

## 2020-02-10 NOTE — Progress Notes (Signed)
PROGRESS NOTE    Judge StallMichael K Fenley  ZOX:[id number] DOB: 1971-01-06 DOA: 02/09/2020 PCP: Patient, No Pcp Per   Brief Narrative: Taken from H&P. Judge StallMichael K Delgreco is a 49 y.o. male with medical history significant of EtOH abuse, GERD, HTN, chronic pancreatitis, alcohol related liver cirrhosis, DM2, COPD presented with abdominal pain associated with nausea, vomiting and diarrhea. Patient continued to drain between acute on chronic -5 hard mikes lemonade's per day. Lipase was within normal limit.  Mild hypokalemia.  CT abdomen with cirrhosis and no other acute abnormality. Admitted for acute on chronic pancreatitis.  Subjective: Patient was complaining of abdominal and upper back pain.  When asked about drinking, he states that he will try to decrease.  Had extensive discussion regarding quitting of alcohol use as he also has cirrhosis. Per patient he will felt little nauseated with clear liquid diet, no vomiting since this morning.  Had 1 soft to loose bowel movement since morning. Discussed about his TSH results, according to patient he knows that he has some problem with his metabolism but he was not taking any thyroid replacement yet.  He did endorse cold intolerance and frequent constipation.  Assessment & Plan:   Active Problems:   Acute on chronic pancreatitis (HCC)   ETOH abuse   Hemorrhoids   Portal hypertension (HCC)   Alcoholic cirrhosis of liver without ascites (HCC)   Hypokalemia   Tobacco abuse  Acute on chronic pancreatitis??/Alcoholic gastritis.  Continue to have abdominal pain with some nausea.  Lipase and within normal limit.  CT abdomen without any acute abnormalities.  Concern of liver cirrhosis with some portal hypertension.  No ascites.  Patient remained afebrile with no leukocytosis.  Alcoholic gastritis is more likely than pancreatitis as this time. -Clear liquid diet-advance as tolerated -Twice daily Protonix. -Discontinue IV fluid. -Supportive care with pain  management.  Alcohol abuse.  Provided extensive counseling again today. -Continue with CIWA protocol.  Alcoholic liver cirrhosis without ascites.  CT abdomen concerning for liver cirrhosis with some portal hypertension.  T bili mildly elevated at 1.7 with INR of 1.3.  Serum ammonia elevated at 66, sensorium within normal limit. -Patient will need outpatient evaluation for varices. -We will get benefit from a GI referral to be seen as an outpatient. -He will get benefit with addition of lactulose to avoid encephalopathy, will add once diarrhea resolved.  Discussed with patient about titration of dose and importance of having 2-3 soft bowel movements daily.  Tobacco abuse.  Provided counseling. -Continue with nicotine patch.  Hypokalemia.  Resolved.  Hypomagnesemia.  Magnesium at 1.6.  Most likely secondary to GI losses. -Replete magnesium and monitor.  Macrocytosis.  MCV at 104.  Most likely multifactorial secondary to folate deficiency, it was 1.7, B12 at lower normal limit and alcoholic liver cirrhosis. -Repeat folic acid and B12. -Alcohol cessation.  Hypertension.  Blood pressure elevated. -Continue with home dose of amlodipine. -Increase the dose of lisinopril from 10 to 20 mg daily.  Hypothyroidism.  TSH markedly elevated at 14.710.  He was told by his PCP before that he has some problem with his hormone regulating his metabolism most likely thyroid.  He was not on any replacement. -Check T3 and T4. -Start him on Synthroid. -Patient will need TSH monitoring within the next 4 to 6 weeks by PCP.  Type 2 diabetes mellitus.  CBG within goal. -Continue with SSI.  COPD.  No acute concern or wheezing. -Continue home bronchodilators.  Objective: Vitals:   02/10/20 0052 02/10/20 0411  02/10/20 0745 02/10/20 1157  BP: (!) 167/106 (!) 178/104 (!) 149/89 (!) 158/99  Pulse: 79 91 78 67  Resp: 16  20 14   Temp: 98.1 F (36.7 C) 97.6 F (36.4 C) 98 F (36.7 C) 98.2 F (36.8 C)    TempSrc: Oral  Oral Oral  SpO2: 91% 90% 94% 95%  Weight:      Height:        Intake/Output Summary (Last 24 hours) at 02/10/2020 1318 Last data filed at 02/10/2020 0400 Gross per 24 hour  Intake 481.46 ml  Output --  Net 481.46 ml   Filed Weights   02/09/20 1705  Weight: 117.9 kg    Examination:  General exam: Obese gentleman, appears calm and comfortable  Respiratory system: Clear to auscultation. Respiratory effort normal. Cardiovascular system: S1 & S2 heard, RRR. No JVD, murmurs, rubs, gallops. Gastrointestinal system: Soft, nontender, nondistended, bowel sounds positive. Central nervous system: Alert and oriented. No focal neurological deficits.Symmetric 5 x 5 power. Extremities: No edema, no cyanosis, pulses intact and symmetrical. Psychiatry: Judgement and insight appear normal. Mood & affect appropriate.    DVT prophylaxis: SCDs.  Code Status: Full Family Communication: Discussed with patient. Disposition Plan:  Status is: Inpatient  Remains inpatient appropriate because:Inpatient level of care appropriate due to severity of illness   Dispo: The patient is from: Home              Anticipated d/c is to: Home              Anticipated d/c date is: 1 day              Patient currently is not medically stable to d/c.   Consultants:   None  Procedures:  Antimicrobials:   Data Reviewed: I have personally reviewed following labs and imaging studies  CBC: Recent Labs  Lab 02/09/20 1707 02/10/20 0303  WBC 9.2 8.6  NEUTROABS  --  5.1  HGB 15.2 15.1  HCT 43.7 44.2  MCV 103.3* 104.2*  PLT 181 167   Basic Metabolic Panel: Recent Labs  Lab 02/09/20 1707 02/10/20 0303  NA 138 138  K 3.1* 3.5  CL 102 102  CO2 26 25  GLUCOSE 99 109*  BUN <5* <5*  CREATININE 0.74 0.63  CALCIUM 8.5* 8.0*  MG  --  1.6*  1.6*  PHOS  --  3.8  3.7   GFR: Estimated Creatinine Clearance: 145.8 mL/min (by C-G formula based on SCr of 0.63 mg/dL). Liver Function  Tests: Recent Labs  Lab 02/09/20 1707 02/10/20 0303  AST 90* 86*  ALT 42 41  ALKPHOS 101 104  BILITOT 1.0 1.7*  PROT 7.7 7.7  ALBUMIN 3.6 3.5   Recent Labs  Lab 02/09/20 1707  LIPASE 41   Recent Labs  Lab 02/10/20 0729  AMMONIA 66*   Coagulation Profile: Recent Labs  Lab 02/10/20 0303  INR 1.3*   Cardiac Enzymes: Recent Labs  Lab 02/10/20 0303  CKTOTAL 145   BNP (last 3 results) No results for input(s): PROBNP in the last 8760 hours. HbA1C: Recent Labs    02/10/20 0303  HGBA1C 6.4*   CBG: Recent Labs  Lab 02/09/20 1705 02/10/20 0059 02/10/20 0408 02/10/20 0803 02/10/20 1158  GLUCAP 103* 100* 94 109* 128*   Lipid Profile: No results for input(s): CHOL, HDL, LDLCALC, TRIG, CHOLHDL, LDLDIRECT in the last 72 hours. Thyroid Function Tests: Recent Labs    02/10/20 0303 02/10/20 0729  TSH 14.710*  --  FREET4  --  0.73   Anemia Panel: Recent Labs    02/10/20 0625  VITAMINB12 262  FOLATE 1.7*   Sepsis Labs: No results for input(s): PROCALCITON, LATICACIDVEN in the last 168 hours.  Recent Results (from the past 240 hour(s))  Respiratory Panel by RT PCR (Flu A&B, Covid) - Nasopharyngeal Swab     Status: None   Collection Time: 02/09/20 10:47 PM   Specimen: Nasopharyngeal Swab  Result Value Ref Range Status   SARS Coronavirus 2 by RT PCR NEGATIVE NEGATIVE Final    Comment: (NOTE) SARS-CoV-2 target nucleic acids are NOT DETECTED.  The SARS-CoV-2 RNA is generally detectable in upper respiratoy specimens during the acute phase of infection. The lowest concentration of SARS-CoV-2 viral copies this assay can detect is 131 copies/mL. A negative result does not preclude SARS-Cov-2 infection and should not be used as the sole basis for treatment or other patient management decisions. A negative result may occur with  improper specimen collection/handling, submission of specimen other than nasopharyngeal swab, presence of viral mutation(s) within  the areas targeted by this assay, and inadequate number of viral copies (<131 copies/mL). A negative result must be combined with clinical observations, patient history, and epidemiological information. The expected result is Negative.  Fact Sheet for Patients:  https://www.moore.com/  Fact Sheet for Healthcare Providers:  https://www.young.biz/  This test is no t yet approved or cleared by the [place] FDA and  has been authorized for detection and/or diagnosis of SARS-CoV-2 by FDA under an Emergency Use Authorization (EUA). This EUA will remain  in effect (meaning this test can be used) for the duration of the COVID-19 declaration under Section 564(b)(1) of the Act, 21 U.S.C. section 360bbb-3(b)(1), unless the authorization is terminated or revoked sooner.     Influenza A by PCR NEGATIVE NEGATIVE Final   Influenza B by PCR NEGATIVE NEGATIVE Final    Comment: (NOTE) The Xpert Xpress SARS-CoV-2/FLU/RSV assay is intended as an aid in  the diagnosis of influenza from Nasopharyngeal swab specimens and  should not be used as a sole basis for treatment. Nasal washings and  aspirates are unacceptable for Xpert Xpress SARS-CoV-2/FLU/RSV  testing.  Fact Sheet for Patients: https://www.moore.com/  Fact Sheet for Healthcare Providers: https://www.young.biz/  This test is not yet approved or cleared by the [place] FDA and  has been authorized for detection and/or diagnosis of SARS-CoV-2 by  FDA under an Emergency Use Authorization (EUA). This EUA will remain  in effect (meaning this test can be used) for the duration of the  Covid-19 declaration under Section 564(b)(1) of the Act, 21  U.S.C. section 360bbb-3(b)(1), unless the authorization is  terminated or revoked. Performed at [facility], [address], [place], [place] [address]      Radiology Studies: CT ABDOMEN PELVIS W  CONTRAST  Result Date: 02/09/2020 CLINICAL DATA:  Abdominal pain. EXAM: CT ABDOMEN AND PELVIS WITH CONTRAST TECHNIQUE: Multidetector CT imaging of the abdomen and pelvis was performed using the standard protocol following bolus administration of intravenous contrast. CONTRAST:  OMNIPAQUE IOHEXOL 300 MG/ML  SOLN COMPARISON:  CT scan 09/08/2019 FINDINGS: Lower chest: Insert lung bases Hepatobiliary: Patient had severe fatty infiltration of the liver on the prior CT. This is not as evident today but there are some geographic areas of residual fatty infiltration without definite hepatic lesions or intrahepatic biliary dilatation. Progressive changes of portal venous hypertension with portal venous collaterals more obvious on today's study. The portal and splenic veins are patent.  Suspect underlying changes of cirrhosis with prominent hepatic fissures. The gallbladder is unremarkable. No common bile duct dilatation. Pancreas: No mass, inflammation or ductal dilatation. Spleen: Normal size.  No focal lesions. Adrenals/Urinary Tract: The adrenal glands and kidneys are unremarkable. The bladder is normal. Stomach/Bowel: The stomach, duodenum, small bowel and colon are grossly normal without oral contrast. No acute inflammatory changes, mass lesions or obstructive findings. The terminal ileum and appendix are normal. Vascular/Lymphatic: Scattered vascular calcifications but no aneurysm or dissection. The branch vessels are patent. The major venous structures are patent. Small scattered mesenteric and retroperitoneal lymph nodes but no mass or overt adenopathy. Reproductive: The prostate gland and seminal vesicles are unremarkable. Other: No pelvic mass or adenopathy. Small amount of free pelvic fluid is noted. No inguinal mass or adenopathy. No abdominal wall hernia or subcutaneous lesions. Musculoskeletal: No significant bony findings. IMPRESSION: 1. No acute abdominal/pelvic findings, mass lesions or adenopathy.  2. Progressive changes of portal venous hypertension with portal venous collaterals. 3. Suspect underlying changes of cirrhosis. 4. Previous CT with marked fatty infiltration of the liver. I think there are some areas of residual geographic fatty infiltration but no obvious hepatic mass lesions. MRI abdomen without and with contrast may be helpful for further evaluation. 5. Aortic atherosclerosis. Aortic Atherosclerosis (ICD10-I70.0). Electronically Signed   By: Rudie Meyer M.D.   On: 02/09/2020 20:39    Scheduled Meds: . amLODipine  10 mg Oral Daily  . folic acid  1 mg Oral Daily  . [START ON 02/11/2020] influenza vac split quadrivalent PF  0.5 mL Intramuscular Tomorrow-1000  . insulin aspart  0-9 Units Subcutaneous Q4H  . lisinopril  10 mg Oral Daily  . multivitamin with minerals  1 tablet Oral Daily  . nicotine  21 mg Transdermal Daily  . pantoprazole (PROTONIX) IV  40 mg Intravenous QHS  . sucralfate  1 g Oral TID WC & HS  . thiamine  100 mg Oral Daily   Or  . thiamine  100 mg Intravenous Daily   Continuous Infusions: . sodium chloride 125 mL/hr at 02/10/20 0137     LOS: 1 day   Time spent: 40 minutes.  Arnetha Courser, MD [facility]  If 7PM-7AM, please contact night-coverage [email or web address]  02/10/2020, 1:18 PM   This record has been created using Conservation officer, historic buildings. Errors have been sought and corrected,but may not always be located. Such creation errors do not reflect on the standard of care.

## 2020-02-10 NOTE — Plan of Care (Signed)

## 2020-02-11 LAB — BASIC METABOLIC PANEL
Anion gap: 9 (ref 5–15)
BUN: 6 mg/dL (ref 6–20)
CO2: 25 mmol/L (ref 22–32)
Calcium: 8 mg/dL — ABNORMAL LOW (ref 8.9–10.3)
Chloride: 100 mmol/L (ref 98–111)
Creatinine, Ser: 0.53 mg/dL — ABNORMAL LOW (ref 0.61–1.24)
GFR, Estimated: >60 mL/min (ref >60)
Glucose, Bld: 102 mg/dL — ABNORMAL HIGH (ref 70–99)
Potassium: 3.7 mmol/L (ref 3.5–5.1)
Sodium: 134 mmol/L — ABNORMAL LOW (ref 135–145)

## 2020-02-11 LAB — GLUCOSE, CAPILLARY
Glucose-Capillary: 104 mg/dL — ABNORMAL HIGH (ref 70–99)
Glucose-Capillary: 79 mg/dL (ref 70–99)
Glucose-Capillary: 95 mg/dL (ref 70–99)
Glucose-Capillary: 123 mg/dL — ABNORMAL HIGH (ref 70–99)
Glucose-Capillary: 107 mg/dL — ABNORMAL HIGH (ref 70–99)

## 2020-02-11 LAB — MAGNESIUM: Magnesium: 2 mg/dL (ref 1.7–2.4)

## 2020-02-11 LAB — T3, FREE: T3, Free: 3.8 pg/mL (ref 2.0–4.4)

## 2020-02-11 LAB — HIV ANTIBODY (ROUTINE TESTING W REFLEX): HIV Screen 4th Generation wRfx: NONREACTIVE

## 2020-02-11 LAB — HEPATITIS PANEL, ACUTE
HCV Ab: <0.1 s/co ratio — AB (ref 0.0–0.9)
Hep A IgM: NEGATIVE — AB
Hep B C IgM: NEGATIVE — AB
Hepatitis B Surface Ag: NEGATIVE — AB

## 2020-02-11 LAB — T3: T3, Total: 148 ng/dL (ref 71–180)

## 2020-02-11 LAB — T4: T4, Total: 6.4 ug/dL (ref 4.5–12.0)

## 2020-02-11 MED ORDER — PANTOPRAZOLE SODIUM 40 MG IV SOLR
40.0000 mg | Freq: Two times a day (BID) | INTRAVENOUS | Status: DC
  Administered 2020-02-11 – 2020-02-13 (×4): 40 mg via INTRAVENOUS
  Filled 2020-02-11 (×4): qty 40

## 2020-02-11 MED ORDER — METOCLOPRAMIDE HCL 5 MG PO TABS
5.0000 mg | ORAL_TABLET | Freq: Three times a day (TID) | ORAL | Status: DC
  Administered 2020-02-11 – 2020-02-13 (×4): 5 mg via ORAL
  Filled 2020-02-11 (×4): qty 1

## 2020-02-11 MED ORDER — KETOROLAC TROMETHAMINE 30 MG/ML IJ SOLN
30.0000 mg | Freq: Once | INTRAMUSCULAR | Status: AC
  Administered 2020-02-11: 30 mg via INTRAVENOUS
  Filled 2020-02-11: qty 1

## 2020-02-11 MED ORDER — HYDROMORPHONE HCL 1 MG/ML IJ SOLN
1.0000 mg | Freq: Four times a day (QID) | INTRAMUSCULAR | Status: DC | PRN
  Administered 2020-02-11: 1 mg via INTRAVENOUS
  Filled 2020-02-11: qty 1

## 2020-02-11 MED ORDER — LACTULOSE 10 GM/15ML PO SOLN
10.0000 g | Freq: Two times a day (BID) | ORAL | Status: DC
  Administered 2020-02-11 (×2): 10 g via ORAL
  Filled 2020-02-11 (×2): qty 30

## 2020-02-11 MED ORDER — TRAMADOL HCL 50 MG PO TABS
50.0000 mg | ORAL_TABLET | Freq: Four times a day (QID) | ORAL | Status: DC
  Administered 2020-02-11 – 2020-02-13 (×7): 50 mg via ORAL
  Filled 2020-02-11 (×7): qty 1

## 2020-02-11 NOTE — Plan of Care (Signed)
Pt had some pain this shift. Pt called RN to room stating pain is 10/10. MD Nelson Chimes called and order received for toradol x 1 dose. Pt educated on new medication lactulose today. Pt able to ambulate to bathroom this shift with minimal assist. Diet advanced to FLD. Tolerated well.  Problem: Education: Goal: Knowledge of General Education information will improve Description: Including pain rating scale, medication(s)/side effects and non-pharmacologic comfort measures Outcome: Progressing   Problem: Clinical Measurements: Goal: Ability to maintain clinical measurements within normal limits will improve Outcome: Progressing Goal: Diagnostic test results will improve Outcome: Progressing   Problem: Nutrition: Goal: Adequate nutrition will be maintained Outcome: Progressing   Problem: Pain Managment: Goal: General experience of comfort will improve Outcome: Progressing

## 2020-02-11 NOTE — Progress Notes (Signed)
PROGRESS NOTE    Kristopher [name]  WIO:[id number] DOB: 06-03-1970 DOA: 02/09/2020 PCP: Patient, No Pcp Per   Brief Narrative: Taken from H&P. Kristopher [name] is a 49 y.o. male with medical history significant of EtOH abuse, GERD, HTN, chronic pancreatitis, alcohol related liver cirrhosis, DM2, COPD presented with abdominal pain associated with nausea, vomiting and diarrhea. Patient continued to drain between acute on chronic -5 hard mikes lemonade's per day. Lipase was within normal limit.  Mild hypokalemia.  CT abdomen with cirrhosis and no other acute abnormality. Admitted for acute on chronic pancreatitis.  Subjective: Patient continued to have epigastric pain and stating that he is becoming nauseated with broth. No bowel movement since yesterday morning. Did had 1 vomiting. Continue to ask for Dilaudid. Had another discussion today regarding alcohol cessation as his symptoms are more secondary to alcoholic gastritis.  Assessment & Plan:   Active Problems:   Acute on chronic pancreatitis (HCC)   ETOH abuse   Hemorrhoids   Portal hypertension (HCC)   Alcoholic cirrhosis of liver without ascites (HCC)   Hypokalemia   Tobacco abuse  Alcoholic gastritis.  Continue to have abdominal pain with some nausea.  Lipase and within normal limit.  CT abdomen without any acute abnormalities.  Concern of liver cirrhosis with some portal hypertension.  No ascites.  Patient remained afebrile with no leukocytosis.  Alcoholic gastritis is more likely than pancreatitis as this time. -Full liquid diet-advance as tolerated. -Twice daily Protonix. -Add Reglan before meals. -Supportive care with pain management. -Patient was seen by GI yesterday-possible EGD tomorrow, sent a message to Dr. Norma Fredrickson for confirmation, no response yet.  Alcohol abuse.  Provided extensive counseling again today. -Continue with CIWA protocol.  Alcoholic liver cirrhosis without ascites.  CT abdomen concerning for liver  cirrhosis with some portal hypertension.  T bili mildly elevated at 1.7 with INR of 1.3.  Serum ammonia elevated at 66, sensorium within normal limit. -Patient will need outpatient evaluation for varices. -Patient was seen by GI yesterday-might get EGD tomorrow. -Add lactulose at low-dose of 10 mg twice daily-we will titrate for 2-3 soft bowel movements.  Tobacco abuse.  Provided counseling. -Continue with nicotine patch.  Hypokalemia.  Resolved.  Hypomagnesemia.  Magnesium at 1.6.  Most likely secondary to GI losses. -Replete magnesium and monitor.  Macrocytosis.  MCV at 104.  Most likely multifactorial secondary to folate deficiency, it was 1.7, B12 at lower normal limit and alcoholic liver cirrhosis. -Continue folic acid and B12 supplements. -Alcohol cessation.  Hypertension.  Blood pressure improved today. -Continue with home dose of amlodipine. -Continue increased dose of lisinopril from 10 to 20 mg daily.  Hypothyroidism.  TSH markedly elevated at 14.710.  He was told by his PCP before that he has some problem with his hormone regulating his metabolism most likely thyroid.  He was not on any replacement. -Check T3 and T4. -Start him on Synthroid. -Patient will need TSH monitoring within the next 4 to 6 weeks by PCP.  Type 2 diabetes mellitus.  CBG within goal. -Continue with SSI.  COPD.  No acute concern or wheezing. -Continue home bronchodilators.  Objective: Vitals:   02/11/20 0403 02/11/20 0800 02/11/20 1128 02/11/20 1559  BP: 110/68 137/88 (!) 146/85 (!) 144/96  Pulse: 79 88 84 85  Resp: 20 16 16 16   Temp: 98 F (36.7 C) 98.3 F (36.8 C) 97.8 F (36.6 C) 98.4 F (36.9 C)  TempSrc: Oral Oral Oral Oral  SpO2: 93% 99% 97% 98%  Weight:      Height:        Intake/Output Summary (Last 24 hours) at 02/11/2020 1712 Last data filed at 02/11/2020 1300 Gross per 24 hour  Intake 0 ml  Output --  Net 0 ml   Filed Weights   02/09/20 1705  Weight: 117.9 kg     Examination:  .General. Well-developed, obese gentleman, in no acute distress. Pulmonary.  Lungs clear bilaterally, normal respiratory effort. CV.  Regular rate and rhythm, no JVD, rub or murmur. Abdomen.  Soft, mild epigastric tenderness, nondistended, BS positive. CNS.  Alert and oriented x3.  No focal neurologic deficit. Extremities.  No edema, no cyanosis, pulses intact and symmetrical. Psychiatry.  Judgment and insight appears normal.    DVT prophylaxis: SCDs.  Code Status: Full Family Communication: Discussed with patient. Disposition Plan:  Status is: Inpatient  Remains inpatient appropriate because:Inpatient level of care appropriate due to severity of illness   Dispo: The patient is from: Home              Anticipated d/c is to: Home              Anticipated d/c date is: 1 day              Patient currently is not medically stable to d/c.   Consultants:   None  Procedures:  Antimicrobials:   Data Reviewed: I have personally reviewed following labs and imaging studies  CBC: Recent Labs  Lab 02/09/20 1707 02/10/20 0303  WBC 9.2 8.6  NEUTROABS  --  5.1  HGB 15.2 15.1  HCT 43.7 44.2  MCV 103.3* 104.2*  PLT 181 167   Basic Metabolic Panel: Recent Labs  Lab 02/09/20 1707 02/10/20 0303 02/11/20 0632  NA 138 138 134*  K 3.1* 3.5 3.7  CL 102 102 100  CO2 26 25 25   GLUCOSE 99 109* 102*  BUN <5* <5* 6  CREATININE 0.74 0.63 0.53*  CALCIUM 8.5* 8.0* 8.0*  MG  --  1.6*  1.6* 2.0  PHOS  --  3.8  3.7  --    GFR: Estimated Creatinine Clearance: 145.8 mL/min (A) (by C-G formula based on SCr of 0.53 mg/dL (L)). Liver Function Tests: Recent Labs  Lab 02/09/20 1707 02/10/20 0303  AST 90* 86*  ALT 42 41  ALKPHOS 101 104  BILITOT 1.0 1.7*  PROT 7.7 7.7  ALBUMIN 3.6 3.5   Recent Labs  Lab 02/09/20 1707  LIPASE 41   Recent Labs  Lab 02/10/20 0729  AMMONIA 66*   Coagulation Profile: Recent Labs  Lab 02/10/20 0303  INR 1.3*   Cardiac  Enzymes: Recent Labs  Lab 02/10/20 0303  CKTOTAL 145   BNP (last 3 results) No results for input(s): PROBNP in the last 8760 hours. HbA1C: Recent Labs    02/10/20 0303  HGBA1C 6.4*   CBG: Recent Labs  Lab 02/10/20 2346 02/11/20 0400 02/11/20 0807 02/11/20 1152 02/11/20 1535  GLUCAP 104* 104* 79 95 123*   Lipid Profile: No results for input(s): CHOL, HDL, LDLCALC, TRIG, CHOLHDL, LDLDIRECT in the last 72 hours. Thyroid Function Tests: Recent Labs    02/10/20 0303 02/10/20 0729  TSH 14.710*  --   T4TOTAL  --  6.4  FREET4  --  0.73  T3FREE  --  3.8   Anemia Panel: Recent Labs    02/10/20 0625  VITAMINB12 262  FOLATE 1.7*   Sepsis Labs: No results for input(s): PROCALCITON, LATICACIDVEN in the last 168  hours.  Recent Results (from the past 240 hour(s))  Respiratory Panel by RT PCR (Flu A&B, Covid) - Nasopharyngeal Swab     Status: None   Collection Time: 02/09/20 10:47 PM   Specimen: Nasopharyngeal Swab  Result Value Ref Range Status   SARS Coronavirus 2 by RT PCR NEGATIVE NEGATIVE Final    Comment: (NOTE) SARS-CoV-2 target nucleic acids are NOT DETECTED.  The SARS-CoV-2 RNA is generally detectable in upper respiratoy specimens during the acute phase of infection. The lowest concentration of SARS-CoV-2 viral copies this assay can detect is 131 copies/mL. A negative result does not preclude SARS-Cov-2 infection and should not be used as the sole basis for treatment or other patient management decisions. A negative result may occur with  improper specimen collection/handling, submission of specimen other than nasopharyngeal swab, presence of viral mutation(s) within the areas targeted by this assay, and inadequate number of viral copies (<131 copies/mL). A negative result must be combined with clinical observations, patient history, and epidemiological information. The expected result is Negative.  Fact Sheet for Patients:    https://www.moore.com/  Fact Sheet for Healthcare Providers:  https://www.young.biz/  This test is no t yet approved or cleared by the [place] FDA and  has been authorized for detection and/or diagnosis of SARS-CoV-2 by FDA under an Emergency Use Authorization (EUA). This EUA will remain  in effect (meaning this test can be used) for the duration of the COVID-19 declaration under Section 564(b)(1) of the Act, 21 U.S.C. section 360bbb-3(b)(1), unless the authorization is terminated or revoked sooner.     Influenza A by PCR NEGATIVE NEGATIVE Final   Influenza B by PCR NEGATIVE NEGATIVE Final    Comment: (NOTE) The Xpert Xpress SARS-CoV-2/FLU/RSV assay is intended as an aid in  the diagnosis of influenza from Nasopharyngeal swab specimens and  should not be used as a sole basis for treatment. Nasal washings and  aspirates are unacceptable for Xpert Xpress SARS-CoV-2/FLU/RSV  testing.  Fact Sheet for Patients: https://www.moore.com/  Fact Sheet for Healthcare Providers: https://www.young.biz/  This test is not yet approved or cleared by the [place] FDA and  has been authorized for detection and/or diagnosis of SARS-CoV-2 by  FDA under an Emergency Use Authorization (EUA). This EUA will remain  in effect (meaning this test can be used) for the duration of the  Covid-19 declaration under Section 564(b)(1) of the Act, 21  U.S.C. section 360bbb-3(b)(1), unless the authorization is  terminated or revoked. Performed at [facility], [address] [place], [place] [address]      Radiology Studies: CT ABDOMEN PELVIS W CONTRAST  Result Date: 02/09/2020 CLINICAL DATA:  Abdominal pain. EXAM: CT ABDOMEN AND PELVIS WITH CONTRAST TECHNIQUE: Multidetector CT imaging of the abdomen and pelvis was performed using the standard protocol following bolus administration of intravenous contrast.  CONTRAST:  OMNIPAQUE IOHEXOL 300 MG/ML  SOLN COMPARISON:  CT scan 09/08/2019 FINDINGS: Lower chest: Insert lung bases Hepatobiliary: Patient had severe fatty infiltration of the liver on the prior CT. This is not as evident today but there are some geographic areas of residual fatty infiltration without definite hepatic lesions or intrahepatic biliary dilatation. Progressive changes of portal venous hypertension with portal venous collaterals more obvious on today's study. The portal and splenic veins are patent. Suspect underlying changes of cirrhosis with prominent hepatic fissures. The gallbladder is unremarkable. No common bile duct dilatation. Pancreas: No mass, inflammation or ductal dilatation. Spleen: Normal size.  No focal lesions. Adrenals/Urinary Tract: The  adrenal glands and kidneys are unremarkable. The bladder is normal. Stomach/Bowel: The stomach, duodenum, small bowel and colon are grossly normal without oral contrast. No acute inflammatory changes, mass lesions or obstructive findings. The terminal ileum and appendix are normal. Vascular/Lymphatic: Scattered vascular calcifications but no aneurysm or dissection. The branch vessels are patent. The major venous structures are patent. Small scattered mesenteric and retroperitoneal lymph nodes but no mass or overt adenopathy. Reproductive: The prostate gland and seminal vesicles are unremarkable. Other: No pelvic mass or adenopathy. Small amount of free pelvic fluid is noted. No inguinal mass or adenopathy. No abdominal wall hernia or subcutaneous lesions. Musculoskeletal: No significant bony findings. IMPRESSION: 1. No acute abdominal/pelvic findings, mass lesions or adenopathy. 2. Progressive changes of portal venous hypertension with portal venous collaterals. 3. Suspect underlying changes of cirrhosis. 4. Previous CT with marked fatty infiltration of the liver. I think there are some areas of residual geographic fatty infiltration but no  obvious hepatic mass lesions. MRI abdomen without and with contrast may be helpful for further evaluation. 5. Aortic atherosclerosis. Aortic Atherosclerosis (ICD10-I70.0). Electronically Signed   By: Rudie Meyer M.D.   On: 02/09/2020 20:39    Scheduled Meds: . amLODipine  10 mg Oral Daily  . folic acid  1 mg Oral Daily  . influenza vac split quadrivalent PF  0.5 mL Intramuscular Tomorrow-1000  . insulin aspart  0-9 Units Subcutaneous Q4H  . lactulose  10 g Oral BID  . levothyroxine  125 mcg Oral Q0600  . lisinopril  20 mg Oral Daily  . metoCLOPramide  5 mg Oral TID AC  . multivitamin with minerals  1 tablet Oral Daily  . nicotine  21 mg Transdermal Daily  . pantoprazole (PROTONIX) IV  40 mg Intravenous Q12H  . sucralfate  1 g Oral TID WC & HS  . thiamine  100 mg Oral Daily  . vitamin B-12  1,000 mcg Oral Daily   Continuous Infusions:    LOS: 2 days   Time spent: 30 minutes.  Arnetha Courser, MD [facility]  If 7PM-7AM, please contact night-coverage [email or web address]  02/11/2020, 5:12 PM   This record has been created using Conservation officer, historic buildings. Errors have been sought and corrected,but may not always be located. Such creation errors do not reflect on the standard of care.

## 2020-02-11 NOTE — TOC Initial Note (Signed)
Transition of Care [facility]) - Initial/Assessment Note    Patient Details  Name: Kristopher [name] MRN: [id number] Date of Birth: [birth year]  Transition of Care [facility]) CM/SW Contact:    Chapman Fitch, RN Phone Number: 02/11/2020, 2:46 PM  Clinical Narrative:                  Patient admitted with pancreatitis.  Patient states that he lives with his cousin in [place] He states that they have one car they share, but that his cousin can no longer drives due to a DUI  Last year I scheduled the patient an appointment at [facility] He states that he did not follow through with going to the appointment or rescheduling.   Patient states that he typically receives his care at [facility] and is connected with their charity program.   Patient states "I feel rough, can we talk more tomorrow"?  TOC will follow up tomorrow, provide application to [facility] , Medication Management , substance abuse resources, and determine if patient has a glucometer        Patient Goals and CMS Choice        Expected Discharge Plan and Services                                                Prior Living Arrangements/Services                       Activities of Daily Living Home Assistive Devices/Equipment: None ADL Screening (condition at time of admission) Patient's cognitive ability adequate to safely complete daily activities?: Yes Is the patient deaf or have difficulty hearing?: No Does the patient have difficulty seeing, even when wearing glasses/contacts?: No Does the patient have difficulty concentrating, remembering, or making decisions?: No Patient able to express need for assistance with ADLs?: Yes Does the patient have difficulty dressing or bathing?: No Independently performs ADLs?: Yes (appropriate for developmental age) Does the patient have difficulty walking or climbing stairs?: No Weakness of Legs: None Weakness of Arms/Hands:  None  Permission Sought/Granted                  Emotional Assessment              Admission diagnosis:  Recurrent pancreatitis [K85.90] Acute on chronic pancreatitis (HCC) [K85.90, K86.1] Patient Active Problem List   Diagnosis Date Noted  . Tobacco abuse 02/10/2020  . Acute on chronic pancreatitis (HCC) 02/09/2020  . ETOH abuse 02/09/2020  . Hemorrhoids 02/09/2020  . Portal hypertension (HCC) 02/09/2020  . Alcoholic cirrhosis of liver without ascites (HCC) 02/09/2020  . Hypokalemia 02/09/2020  . Abdominal pain 01/21/2019  . Intractable nausea and vomiting 01/20/2019  . Acute alcoholic pancreatitis 01/06/2019   PCP:  Patient, No Pcp Per Pharmacy:   [id number] [place], [place] - [address] AT [facility] [address] [place] [address] Phone: [phone] Fax: [phone]     Social Determinants of Health (SDOH) Interventions    Readmission Risk Interventions Readmission Risk Prevention Plan 01/08/2019  Transportation Screening Complete  Palliative Care Screening Not Applicable  Medication Review (RN Care Manager) Complete  Some recent data might be hidden

## 2020-02-11 NOTE — H&P (View-Only) (Signed)
GI Inpatient Follow-up Note  Subjective:  Patient seen in follow-up for intractable epigastric abdominal pain and non-bloody and non-bilious emesis presumed 2/2 alcoholic gastritis. No acute events overnight. He reports he did throw up his broth last night which he attributes to extremely poor taste and gagging. No BMs since last night. He continues to endorse severe epigastric abd pain, rated 8/10 in severity. No vomiting episodes so far today. He is continuing to ask for Dilaudid per hospital team. He is requesting real food for dinner.   Scheduled Inpatient Medications:  . amLODipine  10 mg Oral Daily  . folic acid  1 mg Oral Daily  . influenza vac split quadrivalent PF  0.5 mL Intramuscular Tomorrow-1000  . insulin aspart  0-9 Units Subcutaneous Q4H  . lactulose  10 g Oral BID  . levothyroxine  125 mcg Oral Q0600  . lisinopril  20 mg Oral Daily  . metoCLOPramide  5 mg Oral TID AC  . multivitamin with minerals  1 tablet Oral Daily  . nicotine  21 mg Transdermal Daily  . pantoprazole (PROTONIX) IV  40 mg Intravenous Q12H  . sucralfate  1 g Oral TID WC & HS  . thiamine  100 mg Oral Daily  . vitamin B-12  1,000 mcg Oral Daily    Continuous Inpatient Infusions:    PRN Inpatient Medications:  albuterol, labetalol, LORazepam **OR** LORazepam, ondansetron **OR** ondansetron (ZOFRAN) IV  Review of Systems: Constitutional: Weight is stable.  Eyes: No changes in vision. ENT: No oral lesions, sore throat.  GI: see HPI.  Heme/Lymph: No easy bruising.  CV: No chest pain.  GU: No hematuria.  Integumentary: No rashes.  Neuro: No headaches.  Psych: No depression/anxiety.  Endocrine: No heat/cold intolerance.  Allergic/Immunologic: No urticaria.  Resp: No cough, SOB.  Musculoskeletal: No joint swelling.    Physical Examination: BP (!) 144/96 (BP Location: Right Arm)   Pulse 85   Temp 98.4 F (36.9 C) (Oral)   Resp 16   Ht 5' 11" (1.803 m)   Wt 117.9 kg   SpO2 98%   BMI 36.26  kg/m  Gen: NAD, alert and oriented x 4 HEENT: PEERLA, EOMI, Neck: supple, no JVD or thyromegaly Chest: CTA bilaterally, no wheezes, crackles, or other adventitious sounds CV: RRR, no m/g/c/r Abd: mildly distended abdomen, hypoactive BS in all four quadrants; tender to light and deep palpation in epigastric and LUQ, no HSM, guarding, ridigity, or rebound tenderness Ext: no edema, well perfused with 2+ pulses, Skin: no rash or lesions noted Lymph: no LAD   Data: Lab Results  Component Value Date   WBC 8.6 02/10/2020   HGB 15.1 02/10/2020   HCT 44.2 02/10/2020   MCV 104.2 (H) 02/10/2020   PLT 167 02/10/2020   Recent Labs  Lab 02/09/20 1707 02/10/20 0303  HGB 15.2 15.1   Lab Results  Component Value Date   NA 134 (L) 02/11/2020   K 3.7 02/11/2020   CL 100 02/11/2020   CO2 25 02/11/2020   BUN 6 02/11/2020   CREATININE 0.53 (L) 02/11/2020   Lab Results  Component Value Date   ALT 41 02/10/2020   AST 86 (H) 02/10/2020   ALKPHOS 104 02/10/2020   BILITOT 1.7 (H) 02/10/2020   Recent Labs  Lab 02/10/20 0303  INR 1.3*   CT abd/pelvis with contrast 02/09/2020: IMPRESSION: 1. No acute abdominal/pelvic findings, mass lesions or adenopathy. 2. Progressive changes of portal venous hypertension with portal venous collaterals. 3. Suspect underlying changes of   cirrhosis. 4. Previous CT with marked fatty infiltration of the liver. I think there are some areas of residual geographic fatty infiltration but no obvious hepatic mass lesions. MRI abdomen without and with contrast may be helpful for further evaluation. 5. Aortic atherosclerosis.   Assessment/Plan:   49 y/o male with a PMH of COPD, diabetes mellitus, EtOH abuse, and Hx of pancreatitis presented to the [facility] ED yesterday evening for 1-week history of epigastric abdominal pain with nausea and vomiting   1. Epigastric abdominal pain with non-intractable nausea/vomiting - no laboratory or radiographic evidence of  acute or chronic pancreatitis. No leukocytosis or significant elevation in pancreatic enzymes. Most likely cause of his epigastric abd pain is alcoholic gastritis/duodenitis versus peptic ulcer disease. Differential also includes chronic abdominal wall pain, esophagitis, functional dyspepsia, gastroparesis, biliary dyskinesia, functional, MSK, etc   2. Alcoholic cirrhosis of the liver without ascites with findings of portal hypertension - new finding. Previous CT scans without findings of cirrhosis.    3. Alcohol abuse - counseled on importance of cessation   4. Hemorrhoids - likely the cause of his anal outlet bleeding   5. Hypokalemia/Hypomagnesemia - replaced   6. Tobacco abuse   7. Macrocytosis - 104, likely 2/2 cirrhosis and underlying folate def  Recommendations:  - Wean off narcotics - Agree with Protonix twice daily and Reglan before meals - He is requesting solid food (white rice, chicken breast, mashed potatoes). No contraindication to this.  - He can have tramadol for pain - Plan for EGD tomorrow afternoon with Dr. Norma Fredrickson - NPO after midnight - Further recommendations after procedure  I reviewed the risks (including bleeding, perforation, infection, anesthesia complications, cardiac/respiratory complications), benefits and alternatives of EGD. Patient consents to proceed.    Please call with questions or concerns.    Jacob Moores, PA-C [facility] [phone] (Cell)

## 2020-02-11 NOTE — Progress Notes (Signed)
GI Inpatient Follow-up Note  Subjective:  Patient seen in follow-up for intractable epigastric abdominal pain and non-bloody and non-bilious emesis presumed 2/2 alcoholic gastritis. No acute events overnight. He reports he did throw up his broth last night which he attributes to extremely poor taste and gagging. No BMs since last night. He continues to endorse severe epigastric abd pain, rated 8/10 in severity. No vomiting episodes so far today. He is continuing to ask for Dilaudid per hospital team. He is requesting real food for dinner.   Scheduled Inpatient Medications:  . amLODipine  10 mg Oral Daily  . folic acid  1 mg Oral Daily  . influenza vac split quadrivalent PF  0.5 mL Intramuscular Tomorrow-1000  . insulin aspart  0-9 Units Subcutaneous Q4H  . lactulose  10 g Oral BID  . levothyroxine  125 mcg Oral Q0600  . lisinopril  20 mg Oral Daily  . metoCLOPramide  5 mg Oral TID AC  . multivitamin with minerals  1 tablet Oral Daily  . nicotine  21 mg Transdermal Daily  . pantoprazole (PROTONIX) IV  40 mg Intravenous Q12H  . sucralfate  1 g Oral TID WC & HS  . thiamine  100 mg Oral Daily  . vitamin B-12  1,000 mcg Oral Daily    Continuous Inpatient Infusions:    PRN Inpatient Medications:  albuterol, labetalol, LORazepam **OR** LORazepam, ondansetron **OR** ondansetron (ZOFRAN) IV  Review of Systems: Constitutional: Weight is stable.  Eyes: No changes in vision. ENT: No oral lesions, sore throat.  GI: see HPI.  Heme/Lymph: No easy bruising.  CV: No chest pain.  GU: No hematuria.  Integumentary: No rashes.  Neuro: No headaches.  Psych: No depression/anxiety.  Endocrine: No heat/cold intolerance.  Allergic/Immunologic: No urticaria.  Resp: No cough, SOB.  Musculoskeletal: No joint swelling.    Physical Examination: BP (!) 144/96 (BP Location: Right Arm)   Pulse 85   Temp 98.4 F (36.9 C) (Oral)   Resp 16   Ht 5\' 11"  (1.803 m)   Wt 117.9 kg   SpO2 98%   BMI 36.26  kg/m  Gen: NAD, alert and oriented x 4 HEENT: PEERLA, EOMI, Neck: supple, no JVD or thyromegaly Chest: CTA bilaterally, no wheezes, crackles, or other adventitious sounds CV: RRR, no m/g/c/r Abd: mildly distended abdomen, hypoactive BS in all four quadrants; tender to light and deep palpation in epigastric and LUQ, no HSM, guarding, ridigity, or rebound tenderness Ext: no edema, well perfused with 2+ pulses, Skin: no rash or lesions noted Lymph: no LAD   Data: Lab Results  Component Value Date   WBC 8.6 02/10/2020   HGB 15.1 02/10/2020   HCT 44.2 02/10/2020   MCV 104.2 (H) 02/10/2020   PLT 167 02/10/2020   Recent Labs  Lab 02/09/20 1707 02/10/20 0303  HGB 15.2 15.1   Lab Results  Component Value Date   NA 134 (L) 02/11/2020   K 3.7 02/11/2020   CL 100 02/11/2020   CO2 25 02/11/2020   BUN 6 02/11/2020   CREATININE 0.53 (L) 02/11/2020   Lab Results  Component Value Date   ALT 41 02/10/2020   AST 86 (H) 02/10/2020   ALKPHOS 104 02/10/2020   BILITOT 1.7 (H) 02/10/2020   Recent Labs  Lab 02/10/20 0303  INR 1.3*   CT abd/pelvis with contrast 02/09/2020: IMPRESSION: 1. No acute abdominal/pelvic findings, mass lesions or adenopathy. 2. Progressive changes of portal venous hypertension with portal venous collaterals. 3. Suspect underlying changes of  cirrhosis. 4. Previous CT with marked fatty infiltration of the liver. I think there are some areas of residual geographic fatty infiltration but no obvious hepatic mass lesions. MRI abdomen without and with contrast may be helpful for further evaluation. 5. Aortic atherosclerosis.   Assessment/Plan:   49 y/o male with a PMH of COPD, diabetes mellitus, EtOH abuse, and Hx of pancreatitis presented to the [facility] ED yesterday evening for 1-week history of epigastric abdominal pain with nausea and vomiting   1. Epigastric abdominal pain with non-intractable nausea/vomiting - no laboratory or radiographic evidence of  acute or chronic pancreatitis. No leukocytosis or significant elevation in pancreatic enzymes. Most likely cause of his epigastric abd pain is alcoholic gastritis/duodenitis versus peptic ulcer disease. Differential also includes chronic abdominal wall pain, esophagitis, functional dyspepsia, gastroparesis, biliary dyskinesia, functional, MSK, etc   2. Alcoholic cirrhosis of the liver without ascites with findings of portal hypertension - new finding. Previous CT scans without findings of cirrhosis.    3. Alcohol abuse - counseled on importance of cessation   4. Hemorrhoids - likely the cause of his anal outlet bleeding   5. Hypokalemia/Hypomagnesemia - replaced   6. Tobacco abuse   7. Macrocytosis - 104, likely 2/2 cirrhosis and underlying folate def  Recommendations:  - Wean off narcotics - Agree with Protonix twice daily and Reglan before meals - He is requesting solid food (white rice, chicken breast, mashed potatoes). No contraindication to this.  - He can have tramadol for pain - Plan for EGD tomorrow afternoon with Dr. Norma Fredrickson - NPO after midnight - Further recommendations after procedure  I reviewed the risks (including bleeding, perforation, infection, anesthesia complications, cardiac/respiratory complications), benefits and alternatives of EGD. Patient consents to proceed.    Please call with questions or concerns.    Jacob Moores, PA-C [facility] [phone] (Cell)

## 2020-02-11 NOTE — TOC Initial Note (Signed)
Transition of Care [facility]) - Initial/Assessment Note    Patient Details  Name: Kristopher [name] MRN: [id number] Date of Birth: [birth year]  Transition of Care [facility]) CM/SW Contact:    Chapman Fitch, RN Phone Number: 02/11/2020, 10:40 AM  Clinical Narrative:                 [facility] consult for substance abuse Financial consoler at bedside. TOC will re attempt at a later time         Patient Goals and CMS Choice        Expected Discharge Plan and Services                                                Prior Living Arrangements/Services                       Activities of Daily Living Home Assistive Devices/Equipment: None ADL Screening (condition at time of admission) Patient's cognitive ability adequate to safely complete daily activities?: Yes Is the patient deaf or have difficulty hearing?: No Does the patient have difficulty seeing, even when wearing glasses/contacts?: No Does the patient have difficulty concentrating, remembering, or making decisions?: No Patient able to express need for assistance with ADLs?: Yes Does the patient have difficulty dressing or bathing?: No Independently performs ADLs?: Yes (appropriate for developmental age) Does the patient have difficulty walking or climbing stairs?: No Weakness of Legs: None Weakness of Arms/Hands: None  Permission Sought/Granted                  Emotional Assessment              Admission diagnosis:  Recurrent pancreatitis [K85.90] Acute on chronic pancreatitis (HCC) [K85.90, K86.1] Patient Active Problem List   Diagnosis Date Noted  . Tobacco abuse 02/10/2020  . Acute on chronic pancreatitis (HCC) 02/09/2020  . ETOH abuse 02/09/2020  . Hemorrhoids 02/09/2020  . Portal hypertension (HCC) 02/09/2020  . Alcoholic cirrhosis of liver without ascites (HCC) 02/09/2020  . Hypokalemia 02/09/2020  . Abdominal pain 01/21/2019  . Intractable nausea and vomiting 01/20/2019  . Acute  alcoholic pancreatitis 01/06/2019   PCP:  Patient, No Pcp Per Pharmacy:   [id number] [place],  - [address] AT [facility] OF [address] [place] [address] Phone: [phone] Fax: [phone]     Social Determinants of Health (SDOH) Interventions    Readmission Risk Interventions Readmission Risk Prevention Plan 01/08/2019  Transportation Screening Complete  Palliative Care Screening Not Applicable  Medication Review (RN Care Manager) Complete  Some recent data might be hidden

## 2020-02-12 ENCOUNTER — Inpatient Hospital Stay: Payer: Self-pay | Admitting: Certified Registered Nurse Anesthetist

## 2020-02-12 ENCOUNTER — Encounter
Admission: EM | Disposition: A | Discharge status: Home / Self Care | Payer: Self-pay | Source: Home / Self Care | Attending: Internal Medicine

## 2020-02-12 ENCOUNTER — Encounter: Payer: Self-pay | Admitting: Internal Medicine

## 2020-02-12 HISTORY — PX: ESOPHAGOGASTRODUODENOSCOPY: SHX5428

## 2020-02-12 LAB — COMPREHENSIVE METABOLIC PANEL
ALT: 31 U/L (ref 0–44)
AST: 68 U/L — ABNORMAL HIGH (ref 15–41)
Albumin: 3.1 g/dL — ABNORMAL LOW (ref 3.5–5.0)
Alkaline Phosphatase: 94 U/L (ref 38–126)
Anion gap: 9 (ref 5–15)
BUN: 9 mg/dL (ref 6–20)
CO2: 25 mmol/L (ref 22–32)
Calcium: 8.3 mg/dL — ABNORMAL LOW (ref 8.9–10.3)
Chloride: 99 mmol/L (ref 98–111)
Creatinine, Ser: 0.7 mg/dL (ref 0.61–1.24)
GFR, Estimated: >60 mL/min (ref >60)
Glucose, Bld: 104 mg/dL — ABNORMAL HIGH (ref 70–99)
Potassium: 3.6 mmol/L (ref 3.5–5.1)
Sodium: 133 mmol/L — ABNORMAL LOW (ref 135–145)
Total Bilirubin: 1.9 mg/dL — ABNORMAL HIGH (ref 0.3–1.2)
Total Protein: 6.7 g/dL (ref 6.5–8.1)

## 2020-02-12 LAB — GLUCOSE, CAPILLARY
Glucose-Capillary: 101 mg/dL — ABNORMAL HIGH (ref 70–99)
Glucose-Capillary: 103 mg/dL — ABNORMAL HIGH (ref 70–99)
Glucose-Capillary: 103 mg/dL — ABNORMAL HIGH (ref 70–99)
Glucose-Capillary: 112 mg/dL — ABNORMAL HIGH (ref 70–99)
Glucose-Capillary: 117 mg/dL — ABNORMAL HIGH (ref 70–99)
Glucose-Capillary: 181 mg/dL — ABNORMAL HIGH (ref 70–99)

## 2020-02-12 SURGERY — EGD (ESOPHAGOGASTRODUODENOSCOPY)
Anesthesia: General

## 2020-02-12 MED ORDER — MIDAZOLAM HCL 2 MG/2ML IJ SOLN
INTRAMUSCULAR | Status: AC
  Filled 2020-02-12: qty 2

## 2020-02-12 MED ORDER — DEXAMETHASONE SODIUM PHOSPHATE 10 MG/ML IJ SOLN
INTRAMUSCULAR | Status: AC
  Filled 2020-02-12: qty 1

## 2020-02-12 MED ORDER — PROPOFOL 10 MG/ML IV BOLUS
INTRAVENOUS | Status: AC
  Filled 2020-02-12: qty 20

## 2020-02-12 MED ORDER — ROCURONIUM BROMIDE 10 MG/ML (PF) SYRINGE
PREFILLED_SYRINGE | INTRAVENOUS | Status: AC
  Filled 2020-02-12: qty 10

## 2020-02-12 MED ORDER — LIDOCAINE HCL (PF) 2 % IJ SOLN
INTRAMUSCULAR | Status: AC
  Filled 2020-02-12: qty 5

## 2020-02-12 MED ORDER — SODIUM CHLORIDE (PF) 0.9 % IJ SOLN
INTRAMUSCULAR | Status: AC
  Filled 2020-02-12: qty 10

## 2020-02-12 MED ORDER — LIDOCAINE HCL (CARDIAC) PF 100 MG/5ML IV SOSY
PREFILLED_SYRINGE | INTRAVENOUS | Status: DC | PRN
  Administered 2020-02-12: 100 mg via INTRAVENOUS

## 2020-02-12 MED ORDER — SODIUM CHLORIDE 0.9 % IV SOLN: INTRAVENOUS | Status: DC

## 2020-02-12 MED ORDER — MIDAZOLAM HCL 2 MG/2ML IJ SOLN
INTRAMUSCULAR | Status: DC | PRN
  Administered 2020-02-12: 1 mg via INTRAVENOUS

## 2020-02-12 MED ORDER — SUCCINYLCHOLINE CHLORIDE 200 MG/10ML IV SOSY
PREFILLED_SYRINGE | INTRAVENOUS | Status: AC
  Filled 2020-02-12: qty 10

## 2020-02-12 MED ORDER — LACTULOSE 10 GM/15ML PO SOLN
20.0000 g | Freq: Two times a day (BID) | ORAL | Status: DC
  Administered 2020-02-12 – 2020-02-13 (×3): 20 g via ORAL
  Filled 2020-02-12 (×3): qty 30

## 2020-02-12 MED ORDER — PROPOFOL 10 MG/ML IV BOLUS
INTRAVENOUS | Status: DC | PRN
  Administered 2020-02-12: 70 mg via INTRAVENOUS

## 2020-02-12 MED ORDER — ONDANSETRON HCL 4 MG/2ML IJ SOLN
INTRAMUSCULAR | Status: AC
  Filled 2020-02-12: qty 2

## 2020-02-12 MED ORDER — KETAMINE HCL 50 MG/ML IJ SOLN
INTRAMUSCULAR | Status: DC | PRN
  Administered 2020-02-12: 10 mg via INTRAVENOUS

## 2020-02-12 MED ORDER — FENTANYL CITRATE (PF) 100 MCG/2ML IJ SOLN
INTRAMUSCULAR | Status: AC
  Filled 2020-02-12: qty 2

## 2020-02-12 MED ORDER — PROPOFOL 500 MG/50ML IV EMUL
INTRAVENOUS | Status: DC | PRN
  Administered 2020-02-12: 125 ug/kg/min via INTRAVENOUS

## 2020-02-12 NOTE — Op Note (Addendum)
[facility] Patient Name: Kristopher [name] Procedure Date: 02/12/2020 3:05 PM MRN: [id number] Account #: [id number] Date of Birth: Aug 10, 1970 Admit Type: Inpatient Age: 49 Room: [facility] ENDO ROOM 3 Gender: Male Note Status: Finalized Procedure:             Upper GI endoscopy Indications:           Epigastric abdominal pain, Nausea with vomiting Providers:             Boykin Nearing. Norma Fredrickson MD, MD Referring MD:          No Local Md, MD (Referring MD) Medicines:             Propofol per Anesthesia Complications:         No immediate complications. Procedure:             Pre-Anesthesia Assessment:                        - The risks and benefits of the procedure and the                         sedation options and risks were discussed with the                         patient. All questions were answered and informed                         consent was obtained.                        - Patient identification and proposed procedure were                         verified prior to the procedure by the nurse. The                         procedure was verified in the procedure room.                        - ASA Grade Assessment: III - A patient with severe                         systemic disease.                        - After reviewing the risks and benefits, the patient                         was deemed in satisfactory condition to undergo the                         procedure.                        After obtaining informed consent, the endoscope was                         passed under direct vision. Throughout the procedure,                         the  patient's blood pressure, pulse, and oxygen                         saturations were monitored continuously. The Endoscope                         was introduced through the mouth, and advanced to the                         third part of duodenum. The upper GI endoscopy was                          accomplished without difficulty. The patient tolerated                         the procedure well. Findings:      The esophagus was normal.      Moderate portal hypertensive gastropathy was found in the entire       examined stomach. Biopsies were taken with a cold forceps for histology.      There is no endoscopic evidence of ulceration or varices in the cardia       and in the gastric fundus.      The examined duodenum was normal.      The exam was otherwise without abnormality. Impression:            - Normal esophagus.                        - Portal hypertensive gastropathy. Biopsied.                        - Normal examined duodenum.                        - The examination was otherwise normal. Recommendation:        - Await pathology results.                        - Return patient to hospital ward for ongoing care.                        - Advance diet as tolerated No narcotics. Procedure Code(s):     --- Professional ---                        [phone], Esophagogastroduodenoscopy, flexible,                         transoral; with biopsy, single or multiple Diagnosis Code(s):     --- Professional ---                        R11.2, Nausea with vomiting, unspecified                        R10.13, Epigastric pain                        K31.89, Other diseases of stomach and duodenum  K76.6, Portal hypertension CPT copyright 2019 American Medical Association. All rights reserved. The codes documented in this report are preliminary and upon coder review may  be revised to meet current compliance requirements. Stanton Kidney MD, MD 02/12/2020 3:33:11 PM This report has been signed electronically. Number of Addenda: 0 Note Initiated On: 02/12/2020 3:05 PM Estimated Blood Loss:  Estimated blood loss: none.      [facility]

## 2020-02-12 NOTE — Anesthesia Postprocedure Evaluation (Signed)
Anesthesia Post Note  Patient: Kristopher [name]  Procedure(s) Performed: ESOPHAGOGASTRODUODENOSCOPY (EGD) (N/A )  Patient location during evaluation: Endoscopy Anesthesia Type: General Level of consciousness: awake and alert Pain management: pain level controlled Vital Signs Assessment: post-procedure vital signs reviewed and stable Respiratory status: spontaneous breathing, nonlabored ventilation, respiratory function stable and patient connected to nasal cannula oxygen Cardiovascular status: blood pressure returned to baseline and stable Postop Assessment: no apparent nausea or vomiting Anesthetic complications: no   No complications documented.   Last Vitals:  Vitals:   02/12/20 1555 02/12/20 1613  BP: (!) 152/85 (!) 146/87  Pulse: 95 88  Resp: 18 16  Temp:  36.7 C  SpO2: 98% 99%    Last Pain:  Vitals:   02/12/20 1613  TempSrc:   PainSc: 2                  Cleda Mccreedy Inri Sobieski

## 2020-02-12 NOTE — Transfer of Care (Signed)
Immediate Anesthesia Transfer of Care Note  Patient: Kristopher [name]  Procedure(s) Performed: ESOPHAGOGASTRODUODENOSCOPY (EGD) (N/A )  Patient Location: PACU and Endoscopy Unit  Anesthesia Type:General  Level of Consciousness: drowsy  Airway & Oxygen Therapy: Patient Spontanous Breathing and Patient connected to nasal cannula oxygen  Post-op Assessment: Report given to RN and Post -op Vital signs reviewed and stable  Post vital signs: Reviewed and stable  Last Vitals:  Vitals Value Taken Time  BP 149/93 02/12/20 1535  Temp 36.8 C 02/12/20 1535  Pulse 93 02/12/20 1535  Resp 20 02/12/20 1535  SpO2 93 % 02/12/20 1535    Last Pain:  Vitals:   02/12/20 1535  TempSrc: Temporal  PainSc: Asleep      Patients Stated Pain Goal: 0 (02/12/20 1445)  Complications: No complications documented.

## 2020-02-12 NOTE — Anesthesia Preprocedure Evaluation (Signed)
Anesthesia Evaluation  Patient identified by MRN, date of birth, ID band Patient awake    Reviewed: Allergy & Precautions, H&P , NPO status , Patient's Chart, lab work & pertinent test results, reviewed documented beta blocker date and time   History of Anesthesia Complications Negative for: history of anesthetic complications  Airway Mallampati: III  TM Distance: >3 FB Neck ROM: full    Dental  (+) Dental Advidsory Given, Poor Dentition   Pulmonary neg shortness of breath, neg sleep apnea, COPD, neg recent URI, Current Smoker,    Pulmonary exam normal breath sounds clear to auscultation       Cardiovascular Exercise Tolerance: Good hypertension, (-) angina(-) Past MI and (-) Cardiac Stents Normal cardiovascular exam(-) dysrhythmias (-) Valvular Problems/Murmurs Rhythm:regular Rate:Normal     Neuro/Psych PSYCHIATRIC DISORDERS negative neurological ROS     GI/Hepatic GERD  ,(+)     substance abuse  alcohol use,   Endo/Other  diabetes  Renal/GU negative Renal ROS  negative genitourinary   Musculoskeletal   Abdominal   Peds  Hematology negative hematology ROS (+)   Anesthesia Other Findings Past Medical History: No date: Alcohol dependence (HCC) No date: COPD (chronic obstructive pulmonary disease) (HCC) No date: Diabetes mellitus without complication (HCC) No date: GERD (gastroesophageal reflux disease) No date: Hypertension No date: Pancreatitis No date: Pancreatitis   Reproductive/Obstetrics negative OB ROS                             Anesthesia Physical Anesthesia Plan  ASA: III  Anesthesia Plan: General   Post-op Pain Management:    Induction: Intravenous  PONV Risk Score and Plan: 1 and Propofol infusion and TIVA  Airway Management Planned: Natural Airway and Nasal Cannula  Additional Equipment:   Intra-op Plan:   Post-operative Plan:   Informed Consent: I have  reviewed the patients History and Physical, chart, labs and discussed the procedure including the risks, benefits and alternatives for the proposed anesthesia with the patient or authorized representative who has indicated his/her understanding and acceptance.     Dental Advisory Given  Plan Discussed with: Anesthesiologist, CRNA and Surgeon  Anesthesia Plan Comments:         Anesthesia Quick Evaluation

## 2020-02-12 NOTE — TOC Progression Note (Signed)
Transition of Care [facility]) - Progression Note    Patient Details  Name: SMITH POTENZA MRN: [id number] Date of Birth: [birth year]  Transition [facility]) CM/SW Contact  Chapman Fitch, RN Phone Number: 02/12/2020, 10:32 AM  Clinical Narrative:      Followed up with patient at bedside today Provided patient with application to Medication Management , [facility] , as well as substance abuse resource  Patient confirms that he has his home medications including advair, metformin.  Patient states the only medication that he is out of is his "home blood pressure medicine".    Patient states that all of his medications are mailed to him through the charity program through [facility].   Patient confirms that he has a working glucometer and testing supplies.  Patient declines for any follow up to be made locally at [facility]       Expected Discharge Plan and Services                                                 Social Determinants of Health (SDOH) Interventions    Readmission Risk Interventions Readmission Risk Prevention Plan 01/08/2019  Transportation Screening Complete  Palliative Care Screening Not Applicable  Medication Review (RN Care Manager) Complete  Some recent data might be hidden

## 2020-02-12 NOTE — Progress Notes (Signed)
PROGRESS NOTE    Kristopher [name]  MEQ:[id number] DOB: Sep 27, 1970 DOA: 02/09/2020 PCP: Patient, No Pcp Per   Brief Narrative: Taken from H&P. Kristopher [name] is a 49 y.o. male with medical history significant of EtOH abuse, GERD, HTN, chronic pancreatitis, alcohol related liver cirrhosis, DM2, COPD presented with abdominal pain associated with nausea, vomiting and diarrhea. Patient continued to drain between acute on chronic -5 hard mikes lemonade's per day. Lipase was within normal limit.  Mild hypokalemia.  CT abdomen with cirrhosis and no other acute abnormality. Admitted for acute on chronic pancreatitis.  Subjective: Patient continued to complain about abdominal pain.  No bowel movement for the past 2 days despite taking lactulose.  Waiting for EGD later today.  Assessment & Plan:   Active Problems:   Acute on chronic pancreatitis (HCC)   ETOH abuse   Hemorrhoids   Portal hypertension (HCC)   Alcoholic cirrhosis of liver without ascites (HCC)   Hypokalemia   Tobacco abuse  Alcoholic gastritis.  Continue to have abdominal pain with some nausea.  Lipase and within normal limit.  CT abdomen without any acute abnormalities.  Concern of liver cirrhosis with some portal hypertension.  No ascites.  Patient remained afebrile with no leukocytosis.  Alcoholic gastritis is more likely than pancreatitis as this time. -Full liquid diet-advance as tolerated. -Twice daily Protonix. -Add Reglan before meals. -Supportive care with pain management. -Patient was seen by GI yesterday-possible EGD later today.  Alcohol abuse.  Provided extensive counseling again today. -Continue with CIWA protocol.  Alcoholic liver cirrhosis without ascites.  CT abdomen concerning for liver cirrhosis with some portal hypertension.  T bili mildly elevated at 1.7 with INR of 1.3.  Serum ammonia elevated at 66, sensorium within normal limit. -Patient will need outpatient evaluation for varices. -Patient was  seen by GI yesterday-might get EGD tomorrow. -Increase the dose of lactulose to 20 mg twice daily-we will titrate for 2-3 soft bowel movements.  Tobacco abuse.  Provided counseling. -Continue with nicotine patch.  Hypokalemia.  Resolved.  Hypomagnesemia.  Magnesium at 1.6.  Most likely secondary to GI losses. -Replete magnesium and monitor.  Macrocytosis.  MCV at 104.  Most likely multifactorial secondary to folate deficiency, it was 1.7, B12 at lower normal limit and alcoholic liver cirrhosis. -Continue folic acid and B12 supplements. -Alcohol cessation.  Hypertension.  Blood pressure improved today. -Continue with home dose of amlodipine. -Continue increased dose of lisinopril from 10 to 20 mg daily.  Hypothyroidism.  TSH markedly elevated at 14.710.  He was told by his PCP before that he has some problem with his hormone regulating his metabolism most likely thyroid.  He was not on any replacement. -Check T3 and T4. -Start him on Synthroid. -Patient will need TSH monitoring within the next 4 to 6 weeks by PCP.  Type 2 diabetes mellitus.  CBG within goal. -Continue with SSI.  COPD.  No acute concern or wheezing. -Continue home bronchodilators.  Objective: Vitals:   02/12/20 1445 02/12/20 1535 02/12/20 1545 02/12/20 1555  BP: (!) 150/98 (!) 149/93 (!) 148/90 (!) 152/85  Pulse: 92 93 96 95  Resp: 20 20 20 18   Temp: 98.3 F (36.8 C) 98.3 F (36.8 C)    TempSrc: Temporal Temporal    SpO2: 98% 93% 95% 98%  Weight: 117.9 kg     Height: 5\' 11"  (1.803 m)       Intake/Output Summary (Last 24 hours) at 02/12/2020 1610 Last data filed at 02/12/2020 1529 Gross per  24 hour  Intake 130 ml  Output 1 ml  Net 129 ml   Filed Weights   02/09/20 1705 02/12/20 1445  Weight: 117.9 kg 117.9 kg    Examination:  General.  This gentleman, in no acute distress. Pulmonary.  Lungs clear bilaterally, normal respiratory effort. CV.  Regular rate and rhythm, no JVD, rub or  murmur. Abdomen.  Soft, nontender, nondistended, BS positive. CNS.  Alert and oriented x3.  No focal neurologic deficit. Extremities.  No edema, no cyanosis, pulses intact and symmetrical. Psychiatry.  Judgment and insight appears normal.   DVT prophylaxis: SCDs.  Code Status: Full Family Communication: Discussed with patient. Disposition Plan:  Status is: Inpatient  Remains inpatient appropriate because:Inpatient level of care appropriate due to severity of illness   Dispo: The patient is from: Home              Anticipated d/c is to: Home              Anticipated d/c date is: 1 day              Patient currently is not medically stable to d/c.   Consultants:   GI  Procedures:  Antimicrobials:   Data Reviewed: I have personally reviewed following labs and imaging studies  CBC: Recent Labs  Lab 02/09/20 1707 02/10/20 0303  WBC 9.2 8.6  NEUTROABS  --  5.1  HGB 15.2 15.1  HCT 43.7 44.2  MCV 103.3* 104.2*  PLT 181 167   Basic Metabolic Panel: Recent Labs  Lab 02/09/20 1707 02/10/20 0303 02/11/20 0632 02/12/20 0535  NA 138 138 134* 133*  K 3.1* 3.5 3.7 3.6  CL 102 102 100 99  CO2 26 25 25 25   GLUCOSE 99 109* 102* 104*  BUN <5* <5* 6 9  CREATININE 0.74 0.63 0.53* 0.70  CALCIUM 8.5* 8.0* 8.0* 8.3*  MG  --  1.6*  1.6* 2.0  --   PHOS  --  3.8  3.7  --   --    GFR: Estimated Creatinine Clearance: 145.8 mL/min (by C-G formula based on SCr of 0.7 mg/dL). Liver Function Tests: Recent Labs  Lab 02/09/20 1707 02/10/20 0303 02/12/20 0535  AST 90* 86* 68*  ALT 42 41 31  ALKPHOS 101 104 94  BILITOT 1.0 1.7* 1.9*  PROT 7.7 7.7 6.7  ALBUMIN 3.6 3.5 3.1*   Recent Labs  Lab 02/09/20 1707  LIPASE 41   Recent Labs  Lab 02/10/20 0729  AMMONIA 66*   Coagulation Profile: Recent Labs  Lab 02/10/20 0303  INR 1.3*   Cardiac Enzymes: Recent Labs  Lab 02/10/20 0303  CKTOTAL 145   BNP (last 3 results) No results for input(s): PROBNP in the last  8760 hours. HbA1C: Recent Labs    02/10/20 0303  HGBA1C 6.4*   CBG: Recent Labs  Lab 02/12/20 0024 02/12/20 0444 02/12/20 0752 02/12/20 1135 02/12/20 1500  GLUCAP 112* 103* 101* 103* 117*   Lipid Profile: No results for input(s): CHOL, HDL, LDLCALC, TRIG, CHOLHDL, LDLDIRECT in the last 72 hours. Thyroid Function Tests: Recent Labs    02/10/20 0303 02/10/20 0729  TSH 14.710*  --   T4TOTAL  --  6.4  FREET4  --  0.73  T3FREE  --  3.8   Anemia Panel: Recent Labs    02/10/20 0625  VITAMINB12 262  FOLATE 1.7*   Sepsis Labs: No results for input(s): PROCALCITON, LATICACIDVEN in the last 168 hours.  Recent Results (from the  past 240 hour(s))  Respiratory Panel by RT PCR (Flu A&B, Covid) - Nasopharyngeal Swab     Status: None   Collection Time: 02/09/20 10:47 PM   Specimen: Nasopharyngeal Swab  Result Value Ref Range Status   SARS Coronavirus 2 by RT PCR NEGATIVE NEGATIVE Final    Comment: (NOTE) SARS-CoV-2 target nucleic acids are NOT DETECTED.  The SARS-CoV-2 RNA is generally detectable in upper respiratoy specimens during the acute phase of infection. The lowest concentration of SARS-CoV-2 viral copies this assay can detect is 131 copies/mL. A negative result does not preclude SARS-Cov-2 infection and should not be used as the sole basis for treatment or other patient management decisions. A negative result may occur with  improper specimen collection/handling, submission of specimen other than nasopharyngeal swab, presence of viral mutation(s) within the areas targeted by this assay, and inadequate number of viral copies (<131 copies/mL). A negative result must be combined with clinical observations, patient history, and epidemiological information. The expected result is Negative.  Fact Sheet for Patients:  https://www.moore.com/https://www.fda.gov/media/142436/download  Fact Sheet for Healthcare Providers:  https://www.young.biz/https://www.fda.gov/media/142435/download  This test is no t yet  approved or cleared by the [place] FDA and  has been authorized for detection and/or diagnosis of SARS-CoV-2 by FDA under an Emergency Use Authorization (EUA). This EUA will remain  in effect (meaning this test can be used) for the duration of the COVID-19 declaration under Section 564(b)(1) of the Act, 21 U.S.C. section 360bbb-3(b)(1), unless the authorization is terminated or revoked sooner.     Influenza A by PCR NEGATIVE NEGATIVE Final   Influenza B by PCR NEGATIVE NEGATIVE Final    Comment: (NOTE) The Xpert Xpress SARS-CoV-2/FLU/RSV assay is intended as an aid in  the diagnosis of influenza from Nasopharyngeal swab specimens and  should not be used as a sole basis for treatment. Nasal washings and  aspirates are unacceptable for Xpert Xpress SARS-CoV-2/FLU/RSV  testing.  Fact Sheet for Patients: https://www.moore.com/https://www.fda.gov/media/142436/download  Fact Sheet for Healthcare Providers: https://www.young.biz/https://www.fda.gov/media/142435/download  This test is not yet approved or cleared by the [place] FDA and  has been authorized for detection and/or diagnosis of SARS-CoV-2 by  FDA under an Emergency Use Authorization (EUA). This EUA will remain  in effect (meaning this test can be used) for the duration of the  Covid-19 declaration under Section 564(b)(1) of the Act, 21  U.S.C. section 360bbb-3(b)(1), unless the authorization is  terminated or revoked. Performed at [facility], [address] [place], [place] [id number]      Radiology Studies: No results found.  Scheduled Meds: . amLODipine  10 mg Oral Daily  . folic acid  1 mg Oral Daily  . influenza vac split quadrivalent PF  0.5 mL Intramuscular Tomorrow-1000  . insulin aspart  0-9 Units Subcutaneous Q4H  . lactulose  20 g Oral BID  . levothyroxine  125 mcg Oral Q0600  . lisinopril  20 mg Oral Daily  . metoCLOPramide  5 mg Oral TID AC  . multivitamin with minerals  1 tablet Oral Daily  . nicotine  21 mg Transdermal  Daily  . pantoprazole (PROTONIX) IV  40 mg Intravenous Q12H  . sucralfate  1 g Oral TID WC & HS  . thiamine  100 mg Oral Daily  . traMADol  50 mg Oral Q6H  . vitamin B-12  1,000 mcg Oral Daily   Continuous Infusions:    LOS: 3 days   Time spent: 25  minutes.  Arnetha CourserSumayya Ledarrius Beauchaine, MD [facility]  If 7PM-7AM, please contact night-coverage [email or web address]  02/12/2020, 4:10 PM   This record has been created using Conservation officer, historic buildings. Errors have been sought and corrected,but may not always be located. Such creation errors do not reflect on the standard of care.

## 2020-02-12 NOTE — Interval H&P Note (Signed)
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  The various methods of treatment have been discussed with the patient and family. After consideration of risks, benefits and other options for treatment, the patient has consented to  Procedure(s): ESOPHAGOGASTRODUODENOSCOPY (EGD) (N/A) as a surgical intervention.  The patient's history has been reviewed, patient examined, no change in status, stable for surgery.  I have reviewed the patient's chart and labs.  Questions were answered to the patient's satisfaction.     [place], [place]

## 2020-02-13 LAB — GLUCOSE, CAPILLARY
Glucose-Capillary: 107 mg/dL — ABNORMAL HIGH (ref 70–99)
Glucose-Capillary: 126 mg/dL — ABNORMAL HIGH (ref 70–99)
Glucose-Capillary: 135 mg/dL — ABNORMAL HIGH (ref 70–99)
Glucose-Capillary: 88 mg/dL (ref 70–99)

## 2020-02-13 MED ORDER — FOLIC ACID 1 MG PO TABS
1.0000 mg | ORAL_TABLET | Freq: Every day | ORAL | 0 refills | Status: DC
Start: 2020-02-13 — End: 2022-05-15

## 2020-02-13 MED ORDER — METOCLOPRAMIDE HCL 5 MG PO TABS: 5.0000 mg | ORAL_TABLET | Freq: Three times a day (TID) | ORAL | 1 refills | Status: DC | PRN

## 2020-02-13 MED ORDER — LEVOTHYROXINE SODIUM 125 MCG PO TABS
125.0000 ug | ORAL_TABLET | Freq: Every day | ORAL | 1 refills | Status: DC
Start: 2020-02-14 — End: 2022-05-15

## 2020-02-13 MED ORDER — SUCRALFATE 1 GM/10ML PO SUSP
1.0000 g | Freq: Three times a day (TID) | ORAL | 0 refills | Status: DC
Start: 2020-02-13 — End: 2022-05-15

## 2020-02-13 MED ORDER — LACTULOSE 10 GM/15ML PO SOLN
20.0000 g | Freq: Two times a day (BID) | ORAL | 0 refills | Status: DC
Start: 2020-02-13 — End: 2022-05-15

## 2020-02-13 MED ORDER — ADULT MULTIVITAMIN W/MINERALS CH
1.0000 | ORAL_TABLET | Freq: Every day | ORAL | 0 refills | Status: DC
Start: 2020-02-13 — End: 2022-12-14

## 2020-02-13 MED ORDER — THIAMINE HCL 100 MG PO TABS
100.0000 mg | ORAL_TABLET | Freq: Every day | ORAL | 0 refills | Status: DC
Start: 2020-02-13 — End: 2022-05-15

## 2020-02-13 MED ORDER — LISINOPRIL 20 MG PO TABS
20.0000 mg | ORAL_TABLET | Freq: Every day | ORAL | 2 refills | Status: DC
Start: 2020-02-13 — End: 2022-06-25

## 2020-02-13 MED ORDER — NICOTINE 21 MG/24HR TD PT24
21.0000 mg | MEDICATED_PATCH | Freq: Every day | TRANSDERMAL | 0 refills | Status: DC
Start: 2020-02-13 — End: 2022-06-25

## 2020-02-13 MED ORDER — CYANOCOBALAMIN 1000 MCG PO TABS
1000.0000 ug | ORAL_TABLET | Freq: Every day | ORAL | 0 refills | Status: DC
Start: 2020-02-13 — End: 2022-05-15

## 2020-02-13 NOTE — Treatment Plan (Signed)
Perr Dr. Nelson Chimes , give extra dose of lactulose. Med given.

## 2020-02-13 NOTE — TOC Transition Note (Signed)
Transition of Care [facility]) - CM/SW Discharge Note   Patient Details  Name: Kristopher [name] MRN: [id number] Date of Birth: 1970-10-11  [facility] CM/SW Contact:  Luvenia Redden, RN Phone Number: 02/13/2020, 12:02 PM   Clinical Narrative:    Spoke with pt today and verified discharge home with supportive help in the home and sufficient transportation. Pt was already no Lisinopril 10 mg however attending changed to 20 mg daily. Pt aware and will adjust accordingly and monitor his ongoing blood pressure. Verified pt received substance abuse, [facility] and pt again receives his medications through a charity [employer]. Also spoke with attending concerning the above information with no additional needs identified. Plans for pt to be discharged today.  TOC will continue to follow up for any additional needs.   Final next level of care: Home/Self Care Barriers to Discharge: No Barriers Identified   Patient Goals and CMS Choice        Discharge Placement                       Discharge Plan and Services                                     Social Determinants of Health (SDOH) Interventions     Readmission Risk Interventions Readmission Risk Prevention Plan 01/08/2019  Transportation Screening Complete  Palliative Care Screening Not Applicable  Medication Review (RN Care Manager) Complete  Some recent data might be hidden

## 2020-02-13 NOTE — Progress Notes (Signed)
GI Inpatient Follow-up Note  Subjective:  Patient seen in follow-up for severe epigastric abdominal pain and non-intractable nausea and vomiting. He is s/p EGD yesterday with Dr. Norma Fredrickson showing normal esophagus, portal hypertensive gastropathy with biopsies pending, and normal examined duodenum. No acute events overnight. Pt reports he is feeling a little better, but still reporting epigastric abdominal pain currently rated 5/10 in severity. Mild nausea with no emesis. No BM overnight. He did eat a little bit of his breakfast this morning.   Scheduled Inpatient Medications:  . amLODipine  10 mg Oral Daily  . folic acid  1 mg Oral Daily  . influenza vac split quadrivalent PF  0.5 mL Intramuscular Tomorrow-1000  . insulin aspart  0-9 Units Subcutaneous Q4H  . lactulose  20 g Oral BID  . levothyroxine  125 mcg Oral Q0600  . lisinopril  20 mg Oral Daily  . metoCLOPramide  5 mg Oral TID AC  . multivitamin with minerals  1 tablet Oral Daily  . nicotine  21 mg Transdermal Daily  . pantoprazole (PROTONIX) IV  40 mg Intravenous Q12H  . sucralfate  1 g Oral TID WC & HS  . thiamine  100 mg Oral Daily  . traMADol  50 mg Oral Q6H  . vitamin B-12  1,000 mcg Oral Daily    Continuous Inpatient Infusions:    PRN Inpatient Medications:  albuterol, labetalol, ondansetron **OR** ondansetron (ZOFRAN) IV  Review of Systems: Constitutional: Weight is stable.  Eyes: No changes in vision. ENT: No oral lesions, sore throat.  GI: see HPI.  Heme/Lymph: No easy bruising.  CV: No chest pain.  GU: No hematuria.  Integumentary: No rashes.  Neuro: No headaches.  Psych: No depression/anxiety.  Endocrine: No heat/cold intolerance.  Allergic/Immunologic: No urticaria.  Resp: No cough, SOB.  Musculoskeletal: No joint swelling.    Physical Examination: BP (!) 146/96 (BP Location: Right Arm)   Pulse 89   Temp 97.9 F (36.6 C)   Resp 16   Ht 5\' 11"  (1.803 m)   Wt 117.9 kg   SpO2 97%   BMI 36.26 kg/m   Gen: NAD, alert and oriented x 4 HEENT: PEERLA, EOMI, Neck: supple, no JVD or thyromegaly Chest: CTA bilaterally, no wheezes, crackles, or other adventitious sounds CV: RRR, no m/g/c/r Abd: soft, NT, ND, +BS in all four quadrants; no HSM, guarding, ridigity, or rebound tenderness Ext: no edema, well perfused with 2+ pulses, Skin: no rash or lesions noted Lymph: no LAD  Data: Lab Results  Component Value Date   WBC 8.6 02/10/2020   HGB 15.1 02/10/2020   HCT 44.2 02/10/2020   MCV 104.2 (H) 02/10/2020   PLT 167 02/10/2020   Recent Labs  Lab 02/09/20 1707 02/10/20 0303  HGB 15.2 15.1   Lab Results  Component Value Date   NA 133 (L) 02/12/2020   K 3.6 02/12/2020   CL 99 02/12/2020   CO2 25 02/12/2020   BUN 9 02/12/2020   CREATININE 0.70 02/12/2020   Lab Results  Component Value Date   ALT 31 02/12/2020   AST 68 (H) 02/12/2020   ALKPHOS 94 02/12/2020   BILITOT 1.9 (H) 02/12/2020   Recent Labs  Lab 02/10/20 0303  INR 1.3*   Assessment/Plan:  49 y/o male with a PMH of COPD, diabetes mellitus, EtOH abuse, and Hx of pancreatitis presented to the [facility] ED yesterday evening for 1-week history of epigastric abdominal pain with nausea and vomiting  1. Epigastric abdominal pain with non-intractable nausea/vomiting -  s/p EGD yesterday showing portal hypertensive gastropathy  2. Alcoholic cirrhosis of the liver without ascites with findings of portal hypertension - new finding. Previous CT scans without findings of cirrhosis.   3. Alcohol abuse - counseled on importance of cessation  4. Hemorrhoids - likely the cause of his anal outlet bleeding  5. Hypokalemia/Hypomagnesemia - replaced  6. Tobacco abuse  7. Macrocytosis - 104, likely 2/2 cirrhosis and underlying folate def  Recommendations:  - Reviewed EGD procedure report with patient significant only for portal hypertensive gastropathy. Biopsies pending. - Tolerating PO intake with improvements in  epigastric abd pain - Discussed importance of complete cessation of alcohol - He should be provided with resources (treatment centers, AA, etc) upon discharge from hospital - Anticipate discharge from hospital today - He should establish care with primary GI office upon discharge for further discussion on [facility] surveillance and work-up - Follow-up on results of biopsies from EGD  Please call with questions or concerns.    Jacob Moores, PA-C [facility] [phone] (Cell)

## 2020-02-13 NOTE — Discharge Summary (Signed)
Physician Discharge Summary  Kristopher [name] ZOX:[id number] DOB: [birth year] DOA: 02/09/2020  PCP: Patient, No Pcp Per  Admit date: 02/09/2020 Discharge date: 02/13/2020  Admitted From: Home Disposition: Home  Recommendations for Outpatient Follow-up:  1. Follow up with PCP in 1-2 weeks 2. Follow-up with gastroenterology 3. Please obtain BMP/CBC in one week 4. Please follow up on the following pending results: EGD biopsy results  Home Health: No Equipment/Devices: None Discharge Condition: Stable CODE STATUS:  Diet recommendation: Heart Healthy / Carb Modified   Brief/Interim Summary: Kristopher CircleMichael K [name] a 49 y.o.malewith medical history significant of EtOH abuse, GERD, HTN, chronic pancreatitis, alcohol related liver cirrhosis, DM2, COPD presented with abdominal pain associated with nausea, vomiting and diarrhea. Patient continued to drink between acute on chronic -5 hard mikes lemonade's per day. Lipase was within normal limit.  Mild hypokalemia.  CT abdomen with cirrhosis and no other acute abnormality. There was some initial concern of pancreatitis which seems less likely.  Most likely gastroenteritis secondary to alcohol use.  Symptoms resolved.  Continue to have abdominal pain which prompted GI consultation and he underwent EGD which shows normal esophagus and duodenum and portal hypertensive gastropathy, biopsies were taken with pending results which will be discussed by his gastroenterologist during his outpatient follow-up appointment.  He also has mildly elevated ammonia levels and was started on lactulose with advised to titrate for 2-3 soft bowel movements daily.  Extensive counseling was provided for alcohol cessation and he was provided with resources. Patient will need continuation of counseling with his PCP.  He was also given counseling for smoking cessation and was provided with nicotine patch at his request.  He did had some electrolyte abnormalities which  includes hypokalemia and hypomagnesemia most likely secondary to GI losses which were repleted.  Patient had some macrocytosis, blood work-up with folate and B12 deficiency.  He was started on supplement and alcohol cessation will help.  Patient's blood pressure was elevated on home dose of amlodipine and lisinopril.  Lisinopril dose was increased from 10 mg to 20 mg daily.  Patient was also found to have TSH at 14.710. He was told by his PCP before that he has some problem with his hormone regulating his metabolism most likely thyroid.  He was not on any replacement therapy.  T3 and T4 levels were within normal limit.  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  He was started on thyroid replacement and asked to follow-up with PCP as he will need a repeat TSH in 4 to 6 weeks and titration of his Synthroid.  Patient will continue his medications for diabetes and follow-up with his primary care provider.  Discharge Diagnoses:  Active Problems:   Acute on chronic pancreatitis (HCC)   ETOH abuse   Hemorrhoids   Portal hypertension (HCC)   Alcoholic cirrhosis of liver without ascites (HCC)   Hypokalemia   Tobacco abuse   Discharge Instructions  Discharge Instructions    Diet - low sodium heart healthy   Complete by: As directed    Discharge instructions   Complete by: As directed    It was pleasure taking care of you. As we discussed you have signs of liver cirrhosis, please stop using alcohol. Use lactulose and titrate the dose to have 2-3 soft bowel movements daily. As we discussed your thyroid levels were low so we start you on a replacement, please take it as directed and follow-up with your primary care provider, you will need a repeat testing in 4 to 6 weeks  to optimize your dose. You are also being provided with some supplements which will help you with the deficiencies caused by your prior alcohol use, please take it as directed. You can use your nausea medications only as  needed. You are also being given Protonix you can use it twice a day for next couple of weeks and then decrease it to daily. We also increase the dose of lisinopril to 20 mg daily for better control of your blood pressure. Please follow up with your Gastroenterologist and primacy doctor.   Increase activity slowly   Complete by: As directed      Allergies as of 02/13/2020   No Known Allergies     Medication List    TAKE these medications   albuterol 108 (90 Base) MCG/ACT inhaler Commonly known as: VENTOLIN HFA Inhale 2 puffs into the lungs every 6 (six) hours as needed for wheezing or shortness of breath.   amLODipine 10 MG tablet Commonly known as: NORVASC Take 10 mg by mouth daily.   cyanocobalamin 1000 MCG tablet Take 1 tablet (1,000 mcg total) by mouth daily.   fluticasone 50 MCG/ACT nasal spray Commonly known as: FLONASE Place 1 spray into both nostrils daily.   Fluticasone-Salmeterol 500-50 MCG/DOSE Aepb Commonly known as: ADVAIR Inhale 1 puff into the lungs 2 (two) times daily.   folic acid 1 MG tablet Commonly known as: FOLVITE Take 1 tablet (1 mg total) by mouth daily.   gabapentin 300 MG capsule Commonly known as: NEURONTIN Take 300 mg by mouth 3 (three) times daily.   lactulose 10 GM/15ML solution Commonly known as: CHRONULAC Take 30 mLs (20 g total) by mouth 2 (two) times daily.   levothyroxine 125 MCG tablet Commonly known as: SYNTHROID Take 1 tablet (125 mcg total) by mouth daily at 6 (six) AM. Start taking on: February 14, 2020   lisinopril 20 MG tablet Commonly known as: ZESTRIL Take 1 tablet (20 mg total) by mouth daily. What changed:   medication strength  how much to take   melatonin 3 MG Tabs tablet Take 3 mg by mouth at bedtime as needed (sleep).   metFORMIN 500 MG tablet Commonly known as: GLUCOPHAGE Take 500 mg by mouth 2 (two) times daily with a meal.   metoCLOPramide 5 MG tablet Commonly known as: REGLAN Take 1 tablet (5 mg  total) by mouth every 8 (eight) hours as needed for nausea or vomiting.   multivitamin with minerals Tabs tablet Take 1 tablet by mouth daily.   nicotine 21 mg/24hr patch Commonly known as: NICODERM CQ - dosed in mg/24 hours Place 1 patch (21 mg total) onto the skin daily.   pantoprazole 40 MG tablet Commonly known as: PROTONIX Take 40 mg by mouth daily.   sucralfate 1 GM/10ML suspension Commonly known as: CARAFATE Take 10 mLs (1 g total) by mouth 4 (four) times daily -  with meals and at bedtime.   thiamine 100 MG tablet Take 1 tablet (100 mg total) by mouth daily.       No Known Allergies  Consultations:  GI  Procedures/Studies: CT ABDOMEN PELVIS W CONTRAST  Result Date: 02/09/2020 CLINICAL DATA:  Abdominal pain. EXAM: CT ABDOMEN AND PELVIS WITH CONTRAST TECHNIQUE: Multidetector CT imaging of the abdomen and pelvis was performed using the standard protocol following bolus administration of intravenous contrast. CONTRAST:  OMNIPAQUE IOHEXOL 300 MG/ML  SOLN COMPARISON:  CT scan 09/08/2019 FINDINGS: Lower chest: Insert lung bases Hepatobiliary: Patient had severe fatty infiltration of the  liver on the prior CT. This is not as evident today but there are some geographic areas of residual fatty infiltration without definite hepatic lesions or intrahepatic biliary dilatation. Progressive changes of portal venous hypertension with portal venous collaterals more obvious on today's study. The portal and splenic veins are patent. Suspect underlying changes of cirrhosis with prominent hepatic fissures. The gallbladder is unremarkable. No common bile duct dilatation. Pancreas: No mass, inflammation or ductal dilatation. Spleen: Normal size.  No focal lesions. Adrenals/Urinary Tract: The adrenal glands and kidneys are unremarkable. The bladder is normal. Stomach/Bowel: The stomach, duodenum, small bowel and colon are grossly normal without oral contrast. No acute inflammatory changes,  mass lesions or obstructive findings. The terminal ileum and appendix are normal. Vascular/Lymphatic: Scattered vascular calcifications but no aneurysm or dissection. The branch vessels are patent. The major venous structures are patent. Small scattered mesenteric and retroperitoneal lymph nodes but no mass or overt adenopathy. Reproductive: The prostate gland and seminal vesicles are unremarkable. Other: No pelvic mass or adenopathy. Small amount of free pelvic fluid is noted. No inguinal mass or adenopathy. No abdominal wall hernia or subcutaneous lesions. Musculoskeletal: No significant bony findings. IMPRESSION: 1. No acute abdominal/pelvic findings, mass lesions or adenopathy. 2. Progressive changes of portal venous hypertension with portal venous collaterals. 3. Suspect underlying changes of cirrhosis. 4. Previous CT with marked fatty infiltration of the liver. I think there are some areas of residual geographic fatty infiltration but no obvious hepatic mass lesions. MRI abdomen without and with contrast may be helpful for further evaluation. 5. Aortic atherosclerosis. Aortic Atherosclerosis (ICD10-I70.0). Electronically Signed   By: Rudie Meyer M.D.   On: 02/09/2020 20:39     Subjective: Patient was feeling better when seen today.  Did not had any bowel movement since yesterday.  Again discussed the titration of lactulose and importance of having 2-3 soft bowel movements daily.  Patient was asking for nicotine patch which was provided on discharge.  Discharge Exam: Vitals:   02/13/20 0414 02/13/20 0743  BP: 136/90 135/87  Pulse: 86 81  Resp: 20 20  Temp: 97.8 F (36.6 C) 98 F (36.7 C)  SpO2: 93% 97%   Vitals:   02/12/20 1944 02/13/20 0014 02/13/20 0414 02/13/20 0743  BP: (!) 154/83 137/89 136/90 135/87  Pulse: 89 85 86 81  Resp: Temp: 98.9 F (37.2 C) 98 F (36.7 C) 97.8 F (36.6 C) 98 F (36.7 C)  TempSrc: Oral Oral Oral Oral  SpO2: 98% 99% 93% 97%  Weight:       Height:        General: Pt is alert, awake, not in acute distress Cardiovascular: RRR, S1/S2 +, no rubs, no gallops Respiratory: CTA bilaterally, no wheezing, no rhonchi Abdominal: Soft, NT, ND, bowel sounds + Extremities: no edema, no cyanosis   The results of significant diagnostics from this hospitalization (including imaging, microbiology, ancillary and laboratory) are listed below for reference.    Microbiology: Recent Results (from the past 240 hour(s))  Respiratory Panel by RT PCR (Flu A&B, Covid) - Nasopharyngeal Swab     Status: None   Collection Time: 02/09/20 10:47 PM   Specimen: Nasopharyngeal Swab  Result Value Ref Range Status   SARS Coronavirus 2 by RT PCR NEGATIVE NEGATIVE Final    Comment: (NOTE) SARS-CoV-2 target nucleic acids are NOT DETECTED.  The SARS-CoV-2 RNA is generally detectable in upper respiratoy specimens during the acute phase of infection. The lowest concentration of SARS-CoV-2 viral copies  this assay can detect is 131 copies/mL. A negative result does not preclude SARS-Cov-2 infection and should not be used as the sole basis for treatment or other patient management decisions. A negative result may occur with  improper specimen collection/handling, submission of specimen other than nasopharyngeal swab, presence of viral mutation(s) within the areas targeted by this assay, and inadequate number of viral copies (<131 copies/mL). A negative result must be combined with clinical observations, patient history, and epidemiological information. The expected result is Negative.  Fact Sheet for Patients:  https://www.moore.com/  Fact Sheet for Healthcare Providers:  https://www.young.biz/  This test is no t yet approved or cleared by the [place] FDA and  has been authorized for detection and/or diagnosis of SARS-CoV-2 by FDA under an Emergency Use Authorization (EUA). This EUA will remain  in effect  (meaning this test can be used) for the duration of the COVID-19 declaration under Section 564(b)(1) of the Act, 21 U.S.C. section 360bbb-3(b)(1), unless the authorization is terminated or revoked sooner.     Influenza A by PCR NEGATIVE NEGATIVE Final   Influenza B by PCR NEGATIVE NEGATIVE Final    Comment: (NOTE) The Xpert Xpress SARS-CoV-2/FLU/RSV assay is intended as an aid in  the diagnosis of influenza from Nasopharyngeal swab specimens and  should not be used as a sole basis for treatment. Nasal washings and  aspirates are unacceptable for Xpert Xpress SARS-CoV-2/FLU/RSV  testing.  Fact Sheet for Patients: https://www.moore.com/  Fact Sheet for Healthcare Providers: https://www.young.biz/  This test is not yet approved or cleared by the [place] FDA and  has been authorized for detection and/or diagnosis of SARS-CoV-2 by  FDA under an Emergency Use Authorization (EUA). This EUA will remain  in effect (meaning this test can be used) for the duration of the  Covid-19 declaration under Section 564(b)(1) of the Act, 21  U.S.C. section 360bbb-3(b)(1), unless the authorization is  terminated or revoked. Performed at [facility], [address] [place], [place] [address]      Labs: BNP (last 3 results) No results for input(s): BNP in the last 8760 hours. Basic Metabolic Panel: Recent Labs  Lab 02/09/20 1707 02/10/20 0303 02/11/20 0632 02/12/20 0535  NA 138 138 134* 133*  K 3.1* 3.5 3.7 3.6  CL 102 102 100 99  CO2 GLUCOSE 99 109* 102* 104*  BUN <5* <5* 6 9  CREATININE 0.74 0.63 0.53* 0.70  CALCIUM 8.5* 8.0* 8.0* 8.3*  MG  --  1.6*  1.6* 2.0  --   PHOS  --  3.8  3.7  --   --    Liver Function Tests: Recent Labs  Lab 02/09/20 1707 02/10/20 0303 02/12/20 0535  AST 90* 86* 68*  ALT 42 41 31  ALKPHOS 101 104 94  BILITOT 1.0 1.7* 1.9*  PROT 7.7 7.7 6.7  ALBUMIN 3.6 3.5 3.1*   Recent Labs   Lab 02/09/20 1707  LIPASE 41   Recent Labs  Lab 02/10/20 0729  AMMONIA 66*   CBC: Recent Labs  Lab 02/09/20 1707 02/10/20 0303  WBC 9.2 8.6  NEUTROABS  --  5.1  HGB 15.2 15.1  HCT 43.7 44.2  MCV 103.3* 104.2*  PLT 181 167   Cardiac Enzymes: Recent Labs  Lab 02/10/20 0303  CKTOTAL 145   BNP: Invalid input(s): POCBNP CBG: Recent Labs  Lab 02/12/20 1500 02/12/20 1941 02/13/20 0005 02/13/20 0412 02/13/20 0742  GLUCAP 117* 181* 126* 88 107*  D-Dimer No results for input(s): DDIMER in the last 72 hours. Hgb A1c No results for input(s): HGBA1C in the last 72 hours. Lipid Profile No results for input(s): CHOL, HDL, LDLCALC, TRIG, CHOLHDL, LDLDIRECT in the last 72 hours. Thyroid function studies No results for input(s): TSH, T4TOTAL, T3FREE, THYROIDAB in the last 72 hours.  Invalid input(s): FREET3 Anemia work up No results for input(s): VITAMINB12, FOLATE, FERRITIN, TIBC, IRON, RETICCTPCT in the last 72 hours. Urinalysis    Component Value Date/Time   COLORURINE YELLOW (A) 02/09/2020 1707   APPEARANCEUR CLEAR (A) 02/09/2020 1707   LABSPEC 1.016 02/09/2020 1707   PHURINE 6.0 02/09/2020 1707   GLUCOSEU 50 (A) 02/09/2020 1707   HGBUR NEGATIVE 02/09/2020 1707   BILIRUBINUR NEGATIVE 02/09/2020 1707   KETONESUR NEGATIVE 02/09/2020 1707   PROTEINUR 30 (A) 02/09/2020 1707   NITRITE NEGATIVE 02/09/2020 1707   LEUKOCYTESUR NEGATIVE 02/09/2020 1707   Sepsis Labs Invalid input(s): PROCALCITONIN,  WBC,  LACTICIDVEN Microbiology Recent Results (from the past 240 hour(s))  Respiratory Panel by RT PCR (Flu A&B, Covid) - Nasopharyngeal Swab     Status: None   Collection Time: 02/09/20 10:47 PM   Specimen: Nasopharyngeal Swab  Result Value Ref Range Status   SARS Coronavirus 2 by RT PCR NEGATIVE NEGATIVE Final    Comment: (NOTE) SARS-CoV-2 target nucleic acids are NOT DETECTED.  The SARS-CoV-2 RNA is generally detectable in upper respiratoy specimens during the  acute phase of infection. The lowest concentration of SARS-CoV-2 viral copies this assay can detect is 131 copies/mL. A negative result does not preclude SARS-Cov-2 infection and should not be used as the sole basis for treatment or other patient management decisions. A negative result may occur with  improper specimen collection/handling, submission of specimen other than nasopharyngeal swab, presence of viral mutation(s) within the areas targeted by this assay, and inadequate number of viral copies (<131 copies/mL). A negative result must be combined with clinical observations, patient history, and epidemiological information. The expected result is Negative.  Fact Sheet for Patients:  https://www.moore.com/  Fact Sheet for Healthcare Providers:  https://www.young.biz/  This test is no t yet approved or cleared by the [place] FDA and  has been authorized for detection and/or diagnosis of SARS-CoV-2 by FDA under an Emergency Use Authorization (EUA). This EUA will remain  in effect (meaning this test can be used) for the duration of the COVID-19 declaration under Section 564(b)(1) of the Act, 21 U.S.C. section 360bbb-3(b)(1), unless the authorization is terminated or revoked sooner.     Influenza A by PCR NEGATIVE NEGATIVE Final   Influenza B by PCR NEGATIVE NEGATIVE Final    Comment: (NOTE) The Xpert Xpress SARS-CoV-2/FLU/RSV assay is intended as an aid in  the diagnosis of influenza from Nasopharyngeal swab specimens and  should not be used as a sole basis for treatment. Nasal washings and  aspirates are unacceptable for Xpert Xpress SARS-CoV-2/FLU/RSV  testing.  Fact Sheet for Patients: https://www.moore.com/  Fact Sheet for Healthcare Providers: https://www.young.biz/  This test is not yet approved or cleared by the [place] FDA and  has been authorized for detection and/or diagnosis  of SARS-CoV-2 by  FDA under an Emergency Use Authorization (EUA). This EUA will remain  in effect (meaning this test can be used) for the duration of the  Covid-19 declaration under Section 564(b)(1) of the Act, 21  U.S.C. section 360bbb-3(b)(1), unless the authorization is  terminated or revoked. Performed at [facility], [address] [place], [place]  [address]     Time coordinating discharge: Over 30 minutes  SIGNED:  Arnetha Courser, MD  [facility] 02/13/2020, 10:58 AM  If 7PM-7AM, please contact night-coverage [email or web address]  This record has been created using Conservation officer, historic buildings. Errors have been sought and corrected,but may not always be located. Such creation errors do not reflect on the standard of care.

## 2020-02-13 NOTE — Progress Notes (Signed)
Pt AVS given at this time. Pt denies questions. Education given about alcohol cessation and smoking. Pt advised to f/u with PCP and gastrologist. IV removed. Pt denies questions at this time.

## 2020-02-17 LAB — SURGICAL PATHOLOGY

## 2021-09-10 ENCOUNTER — Encounter: Payer: Self-pay | Admitting: Medical Oncology

## 2021-09-10 ENCOUNTER — Emergency Department: Payer: 59

## 2021-09-10 ENCOUNTER — Emergency Department
Admission: EM | Admit: 2021-09-10 | Discharge: 2021-09-10 | Disposition: A | Discharge status: Home / Self Care | Payer: 59 | Attending: Emergency Medicine | Admitting: Emergency Medicine

## 2021-09-10 DIAGNOSIS — J449 Chronic obstructive pulmonary disease, unspecified: Secondary | ICD-10-CM | POA: Insufficient documentation

## 2021-09-10 DIAGNOSIS — I10 Essential (primary) hypertension: Secondary | ICD-10-CM | POA: Insufficient documentation

## 2021-09-10 DIAGNOSIS — R112 Nausea with vomiting, unspecified: Secondary | ICD-10-CM | POA: Diagnosis not present

## 2021-09-10 DIAGNOSIS — F1090 Alcohol use, unspecified, uncomplicated: Secondary | ICD-10-CM

## 2021-09-10 DIAGNOSIS — R1013 Epigastric pain: Secondary | ICD-10-CM

## 2021-09-10 DIAGNOSIS — F17200 Nicotine dependence, unspecified, uncomplicated: Secondary | ICD-10-CM | POA: Insufficient documentation

## 2021-09-10 DIAGNOSIS — E119 Type 2 diabetes mellitus without complications: Secondary | ICD-10-CM | POA: Diagnosis not present

## 2021-09-10 LAB — LIPASE, BLOOD: Lipase: 25 U/L (ref 11–51)

## 2021-09-10 LAB — COMPREHENSIVE METABOLIC PANEL
ALT: 22 U/L (ref 0–44)
AST: 30 U/L (ref 15–41)
Albumin: 4.2 g/dL (ref 3.5–5.0)
Alkaline Phosphatase: 64 U/L (ref 38–126)
Anion gap: 8 (ref 5–15)
BUN: 8 mg/dL (ref 6–20)
CO2: 26 mmol/L (ref 22–32)
Calcium: 8.7 mg/dL — ABNORMAL LOW (ref 8.9–10.3)
Chloride: 104 mmol/L (ref 98–111)
Creatinine, Ser: 0.63 mg/dL (ref 0.61–1.24)
GFR, Estimated: >60 mL/min (ref >60)
Glucose, Bld: 95 mg/dL (ref 70–99)
Potassium: 3.6 mmol/L (ref 3.5–5.1)
Sodium: 138 mmol/L (ref 135–145)
Total Bilirubin: 0.8 mg/dL (ref 0.3–1.2)
Total Protein: 8 g/dL (ref 6.5–8.1)

## 2021-09-10 LAB — CBC
HCT: 44.5 % (ref 39.0–52.0)
Hemoglobin: 14.5 g/dL (ref 13.0–17.0)
MCH: 28.4 pg (ref 26.0–34.0)
MCHC: 32.6 g/dL (ref 30.0–36.0)
MCV: 87.1 fL (ref 80.0–100.0)
Platelets: 225 10*3/uL (ref 150–400)
RBC: 5.11 MIL/uL (ref 4.22–5.81)
RDW: 13 % (ref 11.5–15.5)
WBC: 5.8 10*3/uL (ref 4.0–10.5)
nRBC: 0 % (ref 0.0–0.2)

## 2021-09-10 MED ORDER — ALUM & MAG HYDROXIDE-SIMETH 200-200-20 MG/5ML PO SUSP
30.0000 mL | Freq: Once | ORAL | Status: AC
  Administered 2021-09-10: 30 mL via ORAL
  Filled 2021-09-10: qty 30

## 2021-09-10 MED ORDER — ONDANSETRON 4 MG PO TBDP: 4.0000 mg | ORAL_TABLET | Freq: Three times a day (TID) | ORAL | 0 refills | Status: AC | PRN

## 2021-09-10 MED ORDER — PANTOPRAZOLE SODIUM 40 MG PO TBEC: 40.0000 mg | DELAYED_RELEASE_TABLET | Freq: Every day | ORAL | 0 refills | Status: DC

## 2021-09-10 MED ORDER — HYDROMORPHONE HCL 1 MG/ML IJ SOLN
1.0000 mg | Freq: Once | INTRAMUSCULAR | Status: AC
  Administered 2021-09-10: 1 mg via INTRAVENOUS
  Filled 2021-09-10: qty 1

## 2021-09-10 MED ORDER — CHLORDIAZEPOXIDE HCL 25 MG PO CAPS: ORAL_CAPSULE | ORAL | 0 refills | Status: DC

## 2021-09-10 MED ORDER — CHLORDIAZEPOXIDE HCL 25 MG PO CAPS
50.0000 mg | ORAL_CAPSULE | Freq: Once | ORAL | Status: AC
  Administered 2021-09-10: 50 mg via ORAL
  Filled 2021-09-10: qty 2

## 2021-09-10 MED ORDER — CHLORDIAZEPOXIDE HCL 25 MG PO CAPS: ORAL_CAPSULE | ORAL | 0 refills | Status: AC

## 2021-09-10 MED ORDER — MORPHINE SULFATE (PF) 4 MG/ML IV SOLN
4.0000 mg | Freq: Once | INTRAVENOUS | Status: AC
  Administered 2021-09-10: 4 mg via INTRAVENOUS
  Filled 2021-09-10: qty 1

## 2021-09-10 MED ORDER — ONDANSETRON HCL 4 MG/2ML IJ SOLN
4.0000 mg | Freq: Once | INTRAMUSCULAR | Status: AC
  Administered 2021-09-10: 4 mg via INTRAVENOUS
  Filled 2021-09-10: qty 2

## 2021-09-10 MED ORDER — OXYCODONE-ACETAMINOPHEN 5-325 MG PO TABS
1.0000 | ORAL_TABLET | Freq: Once | ORAL | Status: AC
  Administered 2021-09-10: 1 via ORAL
  Filled 2021-09-10: qty 1

## 2021-09-10 MED ORDER — FAMOTIDINE IN NACL 20-0.9 MG/50ML-% IV SOLN
20.0000 mg | Freq: Once | INTRAVENOUS | Status: AC
Start: 2021-09-10 — End: 2021-09-10
  Administered 2021-09-10: 20 mg via INTRAVENOUS
  Filled 2021-09-10: qty 50

## 2021-09-10 MED ORDER — ONDANSETRON 4 MG PO TBDP
4.0000 mg | ORAL_TABLET | Freq: Once | ORAL | Status: AC
  Administered 2021-09-10: 4 mg via ORAL
  Filled 2021-09-10: qty 1

## 2021-09-10 MED ORDER — ONDANSETRON 4 MG PO TBDP: 4.0000 mg | ORAL_TABLET | Freq: Three times a day (TID) | ORAL | 0 refills | Status: DC | PRN

## 2021-09-10 MED ORDER — LACTATED RINGERS IV BOLUS
1000.0000 mL | Freq: Once | INTRAVENOUS | Status: AC
  Administered 2021-09-10: 1000 mL via INTRAVENOUS

## 2021-09-10 MED ORDER — IOHEXOL 350 MG/ML SOLN
75.0000 mL | Freq: Once | INTRAVENOUS | Status: AC | PRN
  Administered 2021-09-10: 75 mL via INTRAVENOUS

## 2021-09-10 NOTE — Discharge Instructions (Addendum)
Your CAT scan and blood work were all reassuring.  I suspect that your abdominal pain is either related to inflammation of your stomach lining called gastritis related to drinking or an exacerbation of chronic pancreatitis.  I have prescribed you Zofran which you can take as needed for nausea and vomiting and you should start taking the Protonix which can help with pain.    I have also written you a prescription for a Librium taper which you can use for management of alcohol withdrawal while you are stopping drinking.  Please do not take the Librium if you are also going to drink alcohol.

## 2021-09-10 NOTE — ED Notes (Signed)
Pt endorsing increased pain at this time

## 2021-09-10 NOTE — ED Triage Notes (Addendum)
Pt reports that he has been having epigastric pain x 2 days with it radiating around to his back. Pt reports episodes of vomiting. Denies fever or dysuria. Pt reports hx of pancreatitis and this feels similar. Pt reports that he had quit drinking then started back last week, pretty heavily.

## 2021-09-10 NOTE — ED Provider Triage Note (Signed)
Emergency Medicine Provider Triage Evaluation Note  Kristopher [name] , a 51 y.o. male  was evaluated in triage.  Pt complains of epigastric pain and back pain similar to previous episodes of pancreatitis.  Been drinking ethanol..  Review of Systems  Positive:  Negative:   Physical Exam  BP 112/76 (BP Location: Right Arm)   Pulse 79   Temp 97.7 F (36.5 C) (Oral)   Resp 20   Ht 5\' 11"  (1.803 m)   Wt 90.7 kg   SpO2 99%   BMI 27.89 kg/m  Gen:   Awake, no distress   Resp:  Normal effort  MSK:   Moves extremities without difficulty  Other:    Medical Decision Making  Medically screening exam initiated at 9:25 AM.  Appropriate orders placed.  was informed that the remainder of the evaluation will be completed by another provider, this initial triage assessment does not replace that evaluation, and the importance of remaining in the ED until their evaluation is complete.     [name], MD 09/10/21 [phone]

## 2021-09-10 NOTE — ED Notes (Signed)
Left message for SW for patient assistance in getting home

## 2021-09-10 NOTE — ED Notes (Signed)
dc ppw provided to patient. Followup and rx information given. pt declines vs at this time and provides verbal consent for dc at this time. 

## 2021-09-10 NOTE — ED Provider Notes (Signed)
[facility] Provider Note    Event Date/Time   First MD Initiated Contact with Patient 09/10/21 1116     (approximate)   History   Abdominal Pain   HPI  Kristopher [name] is a 51 y.o. male  with pmh ETOH use disorder, pancreatitis who p/w abdominal pain.  Patient notes that he has epigastric abdominal pain rating to the back this been going on for several days.  He has history of alcohol use disorder had quit for many years and started drinking recently.  Pain is constant worse with drinking has had multiple episodes of emesis nonbloody nonbilious.  No melena or blood in his stool.  Denies fevers chills no chest pain.  Pain feels similar to prior episodes of pancreatitis.    Past Medical History:  Diagnosis Date   Alcohol dependence (HCC)    COPD (chronic obstructive pulmonary disease) (HCC)    Diabetes mellitus without complication (HCC)    GERD (gastroesophageal reflux disease)    Hypertension    Pancreatitis    Pancreatitis     Patient Active Problem List   Diagnosis Date Noted   Tobacco abuse 02/10/2020   Acute on chronic pancreatitis (HCC) 02/09/2020   ETOH abuse 02/09/2020   Hemorrhoids 02/09/2020   Portal hypertension (HCC) 02/09/2020   Alcoholic cirrhosis of liver without ascites (HCC) 02/09/2020   Hypokalemia 02/09/2020   Abdominal pain 01/21/2019   Intractable nausea and vomiting 01/20/2019   Acute alcoholic pancreatitis 01/06/2019     Physical Exam  Triage Vital Signs: ED Triage Vitals  Enc Vitals Group     BP 09/10/21 0913 112/76     Pulse Rate 09/10/21 0913 79     Resp 09/10/21 0913 20     Temp 09/10/21 0913 97.7 F (36.5 C)     Temp Source 09/10/21 0913 Oral     SpO2 09/10/21 0913 99 %     Weight 09/10/21 0913 200 lb (90.7 kg)     Height 09/10/21 0913 5\' 11"  (1.803 m)     Head Circumference --      Peak Flow --      Pain Score 09/10/21 0917 10     Pain Loc --      Pain Edu? --      Excl. in [place]? --     Most recent  vital signs: Vitals:   09/10/21 1302 09/10/21 1557  BP: (!) 143/80 (!) 145/84  Pulse: 76 80  Resp: 20 20  Temp:    SpO2: 100% 98%     General: Awake, no distress.  CV:  Good peripheral perfusion.  Resp:  Normal effort.  Abd:  No distention.  Tenderness in the abdomen throughout with no guarding primarily in epigastric region Neuro:             Awake, Alert, Oriented x 3  Other:  Dry tacky mucous membranes   ED Results / Procedures / Treatments  Labs (all labs ordered are listed, but only abnormal results are displayed) Labs Reviewed  COMPREHENSIVE METABOLIC PANEL - Abnormal; Notable for the following components:      Result Value   Calcium 8.7 (*)    All other components within normal limits  LIPASE, BLOOD  CBC  URINALYSIS, ROUTINE W REFLEX MICROSCOPIC     EKG  EKG interpretation performed by myself: NSR, nml axis, nml intervals, no acute ischemic changes    RADIOLOGY CT abdomen pelvis interpreted by myself is negative for acute pathology  PROCEDURES:  Critical Care performed: No  Procedures   MEDICATIONS ORDERED IN ED: Medications  lactated ringers bolus 1,000 mL (0 mLs Intravenous Stopped 09/10/21 1642)  ondansetron (ZOFRAN) injection 4 mg (4 mg Intravenous Given 09/10/21 1153)  famotidine (PEPCID) IVPB 20 mg premix (0 mg Intravenous Stopped 09/10/21 1322)  HYDROmorphone (DILAUDID) injection 1 mg (1 mg Intravenous Given 09/10/21 1153)  morphine (PF) 4 MG/ML injection 4 mg (4 mg Intravenous Given 09/10/21 1323)  iohexol (OMNIPAQUE) 350 MG/ML injection 75 mL (75 mLs Intravenous Contrast Given 09/10/21 1349)  alum & mag hydroxide-simeth (MAALOX/MYLANTA) 200-200-20 MG/5ML suspension 30 mL (30 mLs Oral Given 09/10/21 1438)  oxyCODONE-acetaminophen (PERCOCET/ROXICET) 5-325 MG per tablet 1 tablet (1 tablet Oral Given 09/10/21 1556)  ondansetron (ZOFRAN-ODT) disintegrating tablet 4 mg (4 mg Oral Given 09/10/21 1643)  chlordiazePOXIDE (LIBRIUM) capsule 50 mg (50 mg Oral  Given 09/10/21 1647)     IMPRESSION / MDM / ASSESSMENT AND PLAN / ED COURSE  I reviewed the triage vital signs and the nursing notes.                              Patient's presentation is most consistent with acute presentation with potential threat to life or bodily function.  Differential diagnosis includes, but is not limited to, pancreatitis, gastritis, alcoholic hepatitis, less likely AAA aortic dissection ACS  Patient is a 51 year old male with history of alcohol use disorder recently resumed drinking senting with epigastric abdominal pain.  Pain is constant associate with nausea vomiting radiating around to the back.  Patient appears somewhat uncomfortable and dry on exam.  Vitals are reassuring his labs are also reassuring lipase is normal no leukocytosis or AKI.  Plan to give fluids Pepcid Zofran and Dilaudid for symptom control we will obtain a CT scan as he has not had a CT in recent years.  I suspect gastritis as alcohol related versus chronic pancreatitis.  Patient CT is reassuring no pathology found.  On reassessment patient still having significant pain seems to improve with the IV opiates but then returns.  Given morphine and also GI cocktail.  On reassessment patient's pain is improved he was given ginger ale and crackers and tolerated p.o. and says pain is manageable at this point.  Discussed alcohol cessation and patient is interested in Librium taper and is motivated to quit drinking.  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  We will give first dose of Librium here and then prescribed 4-day taper.  Also prescribe Zofran and Protonix.      FINAL CLINICAL IMPRESSION(S) / ED DIAGNOSES   Final diagnoses:  Epigastric pain  Nausea and vomiting, unspecified vomiting type  Alcohol use disorder     Rx / DC Orders   ED Discharge Orders           Ordered    ondansetron (ZOFRAN-ODT) 4 MG disintegrating tablet  Every 8 hours PRN,   Status:  Discontinued        09/10/21 1636    chlordiazePOXIDE (LIBRIUM) 25 MG capsule  Multiple Frequencies,   Status:  Discontinued        09/10/21 1636    pantoprazole (PROTONIX) 40 MG tablet  Daily,   Status:  Discontinued        09/10/21 1636    ondansetron (ZOFRAN-ODT) 4 MG disintegrating tablet  Every 8 hours PRN        09/10/21 1652    pantoprazole (PROTONIX) 40 MG tablet  Daily        09/10/21 1652    chlordiazePOXIDE (LIBRIUM) 25 MG capsule  Multiple Frequencies        09/10/21 1652             Note:  This document was prepared using Dragon voice recognition software and may include unintentional dictation errors.   Georga Hacking, MD 09/10/21 [phone]

## 2021-12-04 ENCOUNTER — Other Ambulatory Visit: Payer: Self-pay

## 2021-12-04 MED ORDER — FOLIC ACID 1 MG PO TABS
1.0000 mg | ORAL_TABLET | Freq: Every day | ORAL | 0 refills | Status: DC
  Filled 2021-12-04: qty 30, 30d supply, fill #0

## 2021-12-04 MED ORDER — AMLODIPINE BESYLATE 10 MG PO TABS
10.0000 mg | ORAL_TABLET | Freq: Every day | ORAL | 0 refills | Status: DC
  Filled 2021-12-04: qty 30, 30d supply, fill #0

## 2021-12-04 MED ORDER — LISINOPRIL 10 MG PO TABS
10.0000 mg | ORAL_TABLET | Freq: Every day | ORAL | 0 refills | Status: DC
  Filled 2021-12-04: qty 30, 30d supply, fill #0

## 2021-12-04 MED ORDER — PREGABALIN 200 MG PO CAPS
200.0000 mg | ORAL_CAPSULE | Freq: Three times a day (TID) | ORAL | 0 refills | Status: DC
  Filled 2021-12-04: qty 90, 30d supply, fill #0

## 2021-12-04 MED ORDER — METHOCARBAMOL 500 MG PO TABS
500.0000 mg | ORAL_TABLET | Freq: Three times a day (TID) | ORAL | 0 refills | Status: DC
  Filled 2021-12-04: qty 84, 14d supply, fill #0

## 2021-12-04 MED ORDER — FLUTICASONE FUROATE-VILANTEROL 200-25 MCG/ACT IN AEPB
1.0000 | INHALATION_SPRAY | Freq: Every day | RESPIRATORY_TRACT | 0 refills | Status: DC
  Filled 2021-12-04: qty 60, 30d supply, fill #0

## 2021-12-04 MED ORDER — OXYCODONE HCL ER 10 MG PO T12A
10.0000 mg | EXTENDED_RELEASE_TABLET | Freq: Two times a day (BID) | ORAL | 0 refills | Status: DC
  Filled 2021-12-04: qty 10, 5d supply, fill #0

## 2021-12-04 MED ORDER — TAMSULOSIN HCL 0.4 MG PO CAPS
0.4000 mg | ORAL_CAPSULE | Freq: Every day | ORAL | 0 refills | Status: DC
  Filled 2021-12-04: qty 30, 30d supply, fill #0

## 2021-12-04 MED ORDER — OXYCODONE HCL 10 MG PO TABS
10.0000 mg | ORAL_TABLET | ORAL | 0 refills | Status: DC | PRN
  Filled 2021-12-04: qty 30, 5d supply, fill #0

## 2021-12-04 MED ORDER — HYDROXYZINE HCL 50 MG PO TABS
50.0000 mg | ORAL_TABLET | Freq: Every evening | ORAL | 0 refills | Status: DC | PRN
  Filled 2021-12-04: qty 30, 30d supply, fill #0

## 2021-12-07 ENCOUNTER — Other Ambulatory Visit: Payer: Self-pay

## 2022-05-10 ENCOUNTER — Encounter (HOSPITAL_COMMUNITY): Payer: Self-pay

## 2022-05-10 ENCOUNTER — Other Ambulatory Visit: Payer: Self-pay

## 2022-05-10 ENCOUNTER — Ambulatory Visit (HOSPITAL_COMMUNITY)
Admission: EM | Admit: 2022-05-10 | Discharge: 2022-05-10 | Disposition: A | Discharge status: Home / Self Care | Payer: 59 | Attending: Physician Assistant | Admitting: Physician Assistant

## 2022-05-10 ENCOUNTER — Emergency Department (HOSPITAL_COMMUNITY): Payer: 59

## 2022-05-10 ENCOUNTER — Emergency Department (HOSPITAL_COMMUNITY)
Admission: EM | Admit: 2022-05-10 | Discharge: 2022-05-11 | Disposition: A | Discharge status: Home / Self Care | Payer: 59 | Attending: Emergency Medicine | Admitting: Emergency Medicine

## 2022-05-10 DIAGNOSIS — R1010 Upper abdominal pain, unspecified: Secondary | ICD-10-CM | POA: Diagnosis not present

## 2022-05-10 DIAGNOSIS — R112 Nausea with vomiting, unspecified: Secondary | ICD-10-CM | POA: Diagnosis not present

## 2022-05-10 DIAGNOSIS — I10 Essential (primary) hypertension: Secondary | ICD-10-CM

## 2022-05-10 DIAGNOSIS — R1013 Epigastric pain: Secondary | ICD-10-CM

## 2022-05-10 DIAGNOSIS — J449 Chronic obstructive pulmonary disease, unspecified: Secondary | ICD-10-CM | POA: Insufficient documentation

## 2022-05-10 DIAGNOSIS — E119 Type 2 diabetes mellitus without complications: Secondary | ICD-10-CM | POA: Diagnosis not present

## 2022-05-10 DIAGNOSIS — Z760 Encounter for issue of repeat prescription: Secondary | ICD-10-CM

## 2022-05-10 DIAGNOSIS — F17210 Nicotine dependence, cigarettes, uncomplicated: Secondary | ICD-10-CM | POA: Insufficient documentation

## 2022-05-10 DIAGNOSIS — R197 Diarrhea, unspecified: Secondary | ICD-10-CM | POA: Diagnosis not present

## 2022-05-10 LAB — CBC WITH DIFFERENTIAL/PLATELET
Abs Immature Granulocytes: 0.04 10*3/uL (ref 0.00–0.07)
Basophils Absolute: 0 10*3/uL (ref 0.0–0.1)
Basophils Relative: 0 %
Eosinophils Absolute: 0.1 10*3/uL (ref 0.0–0.5)
Eosinophils Relative: 1 %
HCT: 44.5 % (ref 39.0–52.0)
Hemoglobin: 14.9 g/dL (ref 13.0–17.0)
Immature Granulocytes: 0 %
Lymphocytes Relative: 20 %
Lymphs Abs: 2 10*3/uL (ref 0.7–4.0)
MCH: 28.9 pg (ref 26.0–34.0)
MCHC: 33.5 g/dL (ref 30.0–36.0)
MCV: 86.2 fL (ref 80.0–100.0)
Monocytes Absolute: 0.6 10*3/uL (ref 0.1–1.0)
Monocytes Relative: 6 %
Neutro Abs: 6.9 10*3/uL (ref 1.7–7.7)
Neutrophils Relative %: 73 %
Platelets: 293 10*3/uL (ref 150–400)
RBC: 5.16 MIL/uL (ref 4.22–5.81)
RDW: 13.9 % (ref 11.5–15.5)
WBC: 9.7 10*3/uL (ref 4.0–10.5)
nRBC: 0 % (ref 0.0–0.2)

## 2022-05-10 LAB — CBG MONITORING, ED: Glucose-Capillary: 125 mg/dL — ABNORMAL HIGH (ref 70–99)

## 2022-05-10 LAB — COMPREHENSIVE METABOLIC PANEL
ALT: 83 U/L — ABNORMAL HIGH (ref 0–44)
AST: 62 U/L — ABNORMAL HIGH (ref 15–41)
Albumin: 4.4 g/dL (ref 3.5–5.0)
Alkaline Phosphatase: 67 U/L (ref 38–126)
Anion gap: 13 (ref 5–15)
BUN: 14 mg/dL (ref 6–20)
CO2: 23 mmol/L (ref 22–32)
Calcium: 9.4 mg/dL (ref 8.9–10.3)
Chloride: 101 mmol/L (ref 98–111)
Creatinine, Ser: 0.91 mg/dL (ref 0.61–1.24)
GFR, Estimated: >60 mL/min (ref >60)
Glucose, Bld: 123 mg/dL — ABNORMAL HIGH (ref 70–99)
Potassium: 3.5 mmol/L (ref 3.5–5.1)
Sodium: 137 mmol/L (ref 135–145)
Total Bilirubin: 1.4 mg/dL — ABNORMAL HIGH (ref 0.3–1.2)
Total Protein: 7.9 g/dL (ref 6.5–8.1)

## 2022-05-10 LAB — LIPASE, BLOOD: Lipase: 24 U/L (ref 11–51)

## 2022-05-10 MED ORDER — ONDANSETRON 4 MG PO TBDP
4.0000 mg | ORAL_TABLET | Freq: Once | ORAL | Status: AC
  Administered 2022-05-10: 4 mg via ORAL

## 2022-05-10 MED ORDER — AMLODIPINE BESYLATE 10 MG PO TABS
10.0000 mg | ORAL_TABLET | Freq: Every day | ORAL | 1 refills | Status: DC
Start: 2022-05-10 — End: 2022-05-15

## 2022-05-10 MED ORDER — OXYCODONE-ACETAMINOPHEN 5-325 MG PO TABS
1.0000 | ORAL_TABLET | Freq: Once | ORAL | Status: AC
  Administered 2022-05-10: 1 via ORAL
  Filled 2022-05-10: qty 1

## 2022-05-10 MED ORDER — ONDANSETRON 4 MG PO TBDP: 4.0000 mg | ORAL_TABLET | Freq: Three times a day (TID) | ORAL | 0 refills | Status: DC | PRN

## 2022-05-10 MED ORDER — LACTATED RINGERS IV BOLUS
1000.0000 mL | Freq: Once | INTRAVENOUS | Status: AC
  Administered 2022-05-11: 1000 mL via INTRAVENOUS

## 2022-05-10 MED ORDER — ONDANSETRON 4 MG PO TBDP
ORAL_TABLET | ORAL | Status: AC
  Filled 2022-05-10: qty 1

## 2022-05-10 MED ORDER — HYDROMORPHONE HCL 1 MG/ML IJ SOLN
1.0000 mg | Freq: Once | INTRAMUSCULAR | Status: AC
  Administered 2022-05-11: 1 mg via INTRAVENOUS
  Filled 2022-05-10: qty 1

## 2022-05-10 MED ORDER — ONDANSETRON 4 MG PO TBDP
4.0000 mg | ORAL_TABLET | Freq: Once | ORAL | Status: AC
  Administered 2022-05-10: 4 mg via ORAL
  Filled 2022-05-10: qty 1

## 2022-05-10 NOTE — ED Triage Notes (Signed)
Pt arrived from home via POV states that he has not been able to keep anything down x 3 days. And multiple diarrhea stools. 10/10 abd pain bilateral upper abd.

## 2022-05-10 NOTE — ED Provider Notes (Signed)
[place]    CSN: [id number] Arrival date & time: 05/10/22  [place]      History   Chief Complaint Chief Complaint  Patient presents with   Diarrhea    HPI Kristopher [name] is a 52 y.o. male.   Patient presents today with a 24-hour history of GI symptoms including nausea, vomiting, diarrhea.  Reports that his last episode of emesis was yesterday morning but he continues to have ongoing nausea with difficulty keeping food down.  He reports multiple episodes of diarrhea without blood or mucus.  He denies any significant abdominal pain but does have some cramping prior to having a bowel movement.  Denies any fever, chest pain, shortness of breath, recent illness.  He has tried Pepto-Bismol with minimal improvement of symptoms.  Denies history of gastrointestinal disorder such as ulcerative colitis or Crohn's disease; has had pancreatitis in the past but states current episode is not similar to previous episodes of this condition.  He denies any recent antibiotics, medication changes, known sick contacts, recent travel.  Reports that he has been drinking but has had difficulty keeping anything down since symptoms began and he feels terrible.  In addition, he is requesting a refill of his amlodipine for hypertension.  Reports compliance with this medication without significant side effects.  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  Denies any current chest pain, shortness of breath, headache, vision change, dizziness.    Past Medical History:  Diagnosis Date   Alcohol dependence ([place])    COPD (chronic obstructive pulmonary disease) ([place])    Diabetes mellitus without complication ([place])    GERD (gastroesophageal reflux disease)    Hypertension    Pancreatitis    Pancreatitis     Patient Active Problem List   Diagnosis Date Noted   Tobacco abuse 02/10/2020   Acute on chronic pancreatitis ([place]) 02/09/2020   ETOH  abuse 02/09/2020   Hemorrhoids 02/09/2020   Portal hypertension ([place]) 123456   Alcoholic cirrhosis of liver without ascites ([place]) 02/09/2020   Hypokalemia 02/09/2020   Abdominal pain 01/21/2019   Intractable nausea and vomiting 123456   Acute alcoholic pancreatitis 0000000    Past Surgical History:  Procedure Laterality Date   ABDOMINAL SURGERY     ESOPHAGOGASTRODUODENOSCOPY N/A 02/12/2020   Procedure: ESOPHAGOGASTRODUODENOSCOPY (EGD);  Surgeon: Toledo, Benay Pike, MD;  Location: [facility] ENDOSCOPY;  Service: Gastroenterology;  Laterality: N/A;       Home Medications    Prior to Admission medications   Medication Sig Start Date End Date Taking? Authorizing Provider  ondansetron (ZOFRAN-ODT) 4 MG disintegrating tablet Take 1 tablet (4 mg total) by mouth every 8 (eight) hours as needed for nausea or vomiting. 05/10/22  Yes Fernandez Kenley K, PA-C  albuterol (VENTOLIN HFA) 108 (90 Base) MCG/ACT inhaler Inhale 2 puffs into the lungs every 6 (six) hours as needed for wheezing or shortness of breath.    [provider]  amLODipine (NORVASC) 10 MG tablet Take 1 tablet (10 mg total) by mouth daily. 05/10/22   Culley Hedeen, Junie Panning K, PA-C  fluticasone (FLONASE) 50 MCG/ACT nasal spray Place 1 spray into both nostrils daily.    [provider]  fluticasone furoate-vilanterol (BREO ELLIPTA) 200-25 MCG/ACT AEPB INHALE 1 PUFF INTO THE LUNGS ONCE DAILY. 12/04/21     Fluticasone-Salmeterol (ADVAIR) 500-50 MCG/DOSE AEPB Inhale 1 puff into the lungs 2 (two) times daily.    [provider]  folic acid (FOLVITE) 1 MG tablet Take 1 tablet (1 mg total) by mouth daily. 02/13/20   Lorella Nimrod, MD  folic acid (FOLVITE) 1 MG tablet TAKE 1 TABLET BY MOUTH ONCE DAILY. 12/04/21     gabapentin (NEURONTIN) 300 MG capsule Take 300 mg by mouth 3 (three) times daily.    [provider]  hydrOXYzine (ATARAX) 50 MG tablet TAKE 1 TABLET BY MOUTH ONCE AT BEDTIME AS NEEDED FOR SLEEP. 12/04/21      lactulose (CHRONULAC) 10 GM/15ML solution Take 30 mLs (20 g total) by mouth 2 (two) times daily. 02/13/20   Lorella Nimrod, MD  levothyroxine (SYNTHROID) 125 MCG tablet Take 1 tablet (125 mcg total) by mouth daily at 6 (six) AM. 02/14/20   Lorella Nimrod, MD  lisinopril (ZESTRIL) 10 MG tablet Take 1 tablet (10 mg total) by mouth daily. 12/04/21     lisinopril (ZESTRIL) 20 MG tablet Take 1 tablet (20 mg total) by mouth daily. 02/13/20   Lorella Nimrod, MD  melatonin 3 MG TABS tablet Take 3 mg by mouth at bedtime as needed (sleep).    [provider]  metFORMIN (GLUCOPHAGE) 500 MG tablet Take 500 mg by mouth 2 (two) times daily with a meal.    [provider]  methocarbamol (ROBAXIN) 500 MG tablet TAKE 2 TABLETS BY MOUTH (3) TIMES DAILY FOR 14 DAYS. 12/04/21     metoCLOPramide (REGLAN) 5 MG tablet Take 1 tablet (5 mg total) by mouth every 8 (eight) hours as needed for nausea or vomiting. 02/13/20   Lorella Nimrod, MD  Multiple Vitamin (MULTIVITAMIN WITH MINERALS) TABS tablet Take 1 tablet by mouth daily. 02/13/20   Lorella Nimrod, MD  nicotine (NICODERM CQ - DOSED IN MG/24 HOURS) 21 mg/24hr patch Place 1 patch (21 mg total) onto the skin daily. 02/13/20   Lorella Nimrod, MD  oxyCODONE (OXYCONTIN) 10 mg 12 hr tablet TAKE 1 TABLET BY MOUTH EVERY (12) HOURS. 12/04/21     Oxycodone HCl 10 MG TABS TAKE 1 TABLET BY MOUTH EVERY (4) HOURS AS NEDED FOR PAIN (SCALE 7-10). 12/04/21     pantoprazole (PROTONIX) 40 MG tablet Take 40 mg by mouth daily.    [provider]  pantoprazole (PROTONIX) 40 MG tablet Take 1 tablet (40 mg total) by mouth daily. 09/10/21 10/10/21  Rada Hay, MD  pregabalin (LYRICA) 200 MG capsule TAKE 1 CAPSULE BY MOUTH (3) TIMES DAILY. 12/04/21     sucralfate (CARAFATE) 1 GM/10ML suspension Take 10 mLs (1 g total) by mouth 4 (four) times daily -  with meals and at bedtime. 02/13/20   Lorella Nimrod, MD  tamsulosin (FLOMAX) 0.4 MG CAPS capsule TAKE 1 CAPSULE BY MOUTH  ONCE DAILY WITH DINNER. 12/04/21     thiamine 100 MG tablet Take 1 tablet (100 mg total) by mouth daily. 02/13/20   Lorella Nimrod, MD  vitamin B-12 1000 MCG tablet Take 1 tablet (1,000 mcg total) by mouth daily. 02/13/20   Lorella Nimrod, MD    Family History Family History  Problem Relation Age of Onset   Diabetes Other     Social History Social History   Tobacco Use   Smoking status: Every Day    Packs/day: 1.00    Types: Cigarettes   Smokeless tobacco: Never  Vaping Use   Vaping Use: Never used  Substance Use Topics   Alcohol use: Yes    Alcohol/week: 18.0 standard drinks of alcohol    Types: 9 Cans of beer, 9 Shots of  liquor per week    Comment: 5 hard lemonades a day   Drug use: Not Currently    Types: Marijuana, Cocaine     Allergies   Patient has no known allergies.   Review of Systems Review of Systems   Physical Exam Triage Vital Signs ED Triage Vitals  Enc Vitals Group     BP 05/10/22 1756 134/85     Pulse Rate 05/10/22 1756 100     Resp 05/10/22 1756 16     Temp 05/10/22 1756 98.2 F (36.8 C)     Temp Source 05/10/22 1756 Oral     SpO2 05/10/22 1756 94 %     Weight --      Height --      Head Circumference --      Peak Flow --      Pain Score 05/10/22 1757 0     Pain Loc --      Pain Edu? --      Excl. in [place]? --    No data found.  Updated Vital Signs BP 134/85 (BP Location: Left Arm)   Pulse 100   Temp 98.2 F (36.8 C) (Oral)   Resp 16   SpO2 94%   Visual Acuity Right Eye Distance:   Left Eye Distance:   Bilateral Distance:    Right Eye Near:   Left Eye Near:    Bilateral Near:     Physical Exam   UC Treatments / Results  Labs (all labs ordered are listed, but only abnormal results are displayed) Labs Reviewed - No data to display   EKG   Radiology No results found.  Procedures Procedures (including critical care time)  Medications Ordered in UC Medications  ondansetron (ZOFRAN-ODT) disintegrating tablet 4 mg  (4 mg Oral Given 05/10/22 1839)    Initial Impression / Assessment and Plan / UC Course  I have reviewed the triage vital signs and the nursing notes.  Pertinent labs & imaging results that were available during my care of the patient were reviewed by me and considered in my medical decision making (see chart for details).     Patient is well-appearing, afebrile, nontoxic, nontachycardic.  On initial evaluation he had minimal tenderness in his abdomen and was given a dose of Zofran.  On reevaluation at which point we attempted oral challenge he had significant pain in his upper abdomen and ended up with recurrent nausea/vomiting symptoms.  Given he was unable to pass oral challenge and had worsening abdominal pain recommended that he go to the emergency room for further evaluation and management.  He was agreeable to this and will go directly to [facility], ER.  He was stable at the time of discharge.  Patient had requested a refill of his amlodipine.  This was sent to his pharmacy.  Recommended that he follow-up with his primary care.  Final Clinical Impressions(s) / UC Diagnoses   Final diagnoses:  Nausea vomiting and diarrhea  Essential hypertension  Medication refill  Upper abdominal pain     Discharge Instructions      Since you are feeling worse after the medication I recommend you to the emergency room for further evaluation and management.     ED Prescriptions     Medication Sig Dispense Auth. Provider   amLODipine (NORVASC) 10 MG tablet Take 1 tablet (10 mg total) by mouth daily. 30 tablet Khalik Pewitt K, PA-C   ondansetron (ZOFRAN-ODT) 4 MG disintegrating tablet Take 1 tablet (4 mg  total) by mouth every 8 (eight) hours as needed for nausea or vomiting. 20 tablet Danilynn Jemison, Derry Skill, PA-C      PDMP not reviewed this encounter.   Terrilee Croak, PA-C 05/10/22 1855

## 2022-05-10 NOTE — ED Provider Triage Note (Signed)
Emergency Medicine Provider Triage Evaluation Note  DARVELL RINDAL , a 52 y.o. male  was evaluated in triage.  Pt complains of abdominal pain. States pain has been ongoing for 2 days. Having n/v/d and RUQ abdominal pain. Went to urgent care who sent him here. He denies dysuria.   Review of Systems  Positive: See above Negative:   Physical Exam  BP 114/85 (BP Location: Right Arm)   Pulse 91   Temp 98.9 F (37.2 C)   Resp 19   SpO2 95%  Gen:   Awake, no distress   Resp:  Normal effort  MSK:   Moves extremities without difficulty  Other:  RUQ abdominal TTP.   Medical Decision Making  Medically screening exam initiated at 7:48 PM.  Appropriate orders placed.  Willy Eddy was informed that the remainder of the evaluation will be completed by another provider, this initial triage assessment does not replace that evaluation, and the importance of remaining in the ED until their evaluation is complete.     Mickie Hillier, PA-C 05/10/22 2002

## 2022-05-10 NOTE — ED Triage Notes (Signed)
Pt states N/VD for the past 2 days. Pt also states he needs a refill on his amlodipine.

## 2022-05-10 NOTE — ED Notes (Signed)
No answer

## 2022-05-10 NOTE — Discharge Instructions (Signed)
Since you are feeling worse after the medication I recommend you to the emergency room for further evaluation and management.

## 2022-05-11 ENCOUNTER — Emergency Department (HOSPITAL_COMMUNITY): Payer: 59

## 2022-05-11 MED ORDER — ALUM & MAG HYDROXIDE-SIMETH 200-200-20 MG/5ML PO SUSP
30.0000 mL | Freq: Once | ORAL | Status: AC
  Administered 2022-05-11: 30 mL via ORAL
  Filled 2022-05-11: qty 30

## 2022-05-11 MED ORDER — LOPERAMIDE HCL 2 MG PO CAPS: 2.0000 mg | ORAL_CAPSULE | Freq: Four times a day (QID) | ORAL | 0 refills | Status: DC | PRN

## 2022-05-11 MED ORDER — ONDANSETRON 4 MG PO TBDP: 4.0000 mg | ORAL_TABLET | Freq: Three times a day (TID) | ORAL | 0 refills | Status: DC | PRN

## 2022-05-11 MED ORDER — OMEPRAZOLE 20 MG PO CPDR: 20.0000 mg | DELAYED_RELEASE_CAPSULE | Freq: Every day | ORAL | 1 refills | Status: DC

## 2022-05-11 MED ORDER — IOHEXOL 350 MG/ML SOLN
100.0000 mL | Freq: Once | INTRAVENOUS | Status: AC | PRN
  Administered 2022-05-11: 100 mL via INTRAVENOUS

## 2022-05-11 MED ORDER — HYDROMORPHONE HCL 1 MG/ML IJ SOLN
1.0000 mg | Freq: Once | INTRAMUSCULAR | Status: AC
  Administered 2022-05-11: 1 mg via INTRAVENOUS
  Filled 2022-05-11: qty 1

## 2022-05-11 MED ORDER — SUCRALFATE 1 G PO TABS: 1.0000 g | ORAL_TABLET | Freq: Four times a day (QID) | ORAL | 0 refills | Status: DC | PRN

## 2022-05-11 MED ORDER — FAMOTIDINE IN NACL 20-0.9 MG/50ML-% IV SOLN
20.0000 mg | Freq: Once | INTRAVENOUS | Status: AC
  Administered 2022-05-11: 20 mg via INTRAVENOUS
  Filled 2022-05-11: qty 50

## 2022-05-11 NOTE — Discharge Instructions (Signed)
You were evaluated in the Emergency Department and after careful evaluation, we did not find any emergent condition requiring admission or further testing in the hospital.  Your exam/testing today is overall reassuring.  Use the Zofran as needed for nausea.  Use the omeprazole medication daily to prevent acid related pain.  Use the Carafate medication as needed for pain.  Use the Imodium as needed for diarrhea.  Please return to the Emergency Department if you experience any worsening of your condition.   Thank you for allowing [place] to be a part of your care.

## 2022-05-11 NOTE — ED Provider Notes (Signed)
[facility] Emergency Department Provider Note MRN:  [id number]  Arrival date & time: 05/11/22     Chief Complaint   Abdominal Pain, Emesis, and Diarrhea   History of Present Illness   Kristopher [name] is a 52 y.o. year-old male with a history of alcohol use disorder, COPD, diabetes, hypertension, pancreatitis presenting to the ED with chief complaint of abdominal pain.  Persistent abdominal pain over the past 3 days.  Epigastric pain.  Associated with some nausea vomiting, some loose stools.  No fever.  Has not had any alcohol in about a year, proud of his recent sobriety.  No blood in stool or black stools.  Review of Systems  A thorough review of systems was obtained and all systems are negative except as noted in the HPI and PMH.   Patient's Health History    Past Medical History:  Diagnosis Date   Alcohol dependence ([place])    COPD (chronic obstructive pulmonary disease) ([place])    Diabetes mellitus without complication (HCC)    GERD (gastroesophageal reflux disease)    Hypertension    Pancreatitis    Pancreatitis     Past Surgical History:  Procedure Laterality Date   ABDOMINAL SURGERY     ESOPHAGOGASTRODUODENOSCOPY N/A 02/12/2020   Procedure: ESOPHAGOGASTRODUODENOSCOPY (EGD);  Surgeon: Toledo, Benay Pike, MD;  Location: [facility] ENDOSCOPY;  Service: Gastroenterology;  Laterality: N/A;    Family History  Problem Relation Age of Onset   Diabetes Other     Social History   Socioeconomic History   Marital status: Married    Spouse name: Not on file   Number of children: Not on file   Years of education: Not on file   Highest education level: Not on file  Occupational History   Not on file  Tobacco Use   Smoking status: Every Day    Packs/day: 1.00    Types: Cigarettes   Smokeless tobacco: Never  Vaping Use   Vaping Use: Never used  Substance and Sexual Activity   Alcohol use: Yes    Alcohol/week: 18.0 standard drinks of alcohol    Types:  9 Cans of beer, 9 Shots of liquor per week    Comment: 5 hard lemonades a day   Drug use: Not Currently    Types: Marijuana, Cocaine   Sexual activity: Not on file  Other Topics Concern   Not on file  Social History Narrative   Not on file   Social Determinants of Health   Financial Resource Strain: Not on file  Food Insecurity: Not on file  Transportation Needs: Not on file  Physical Activity: Not on file  Stress: Not on file  Social Connections: Not on file  Intimate Partner Violence: Not on file     Physical Exam   Vitals:   05/11/22 0326 05/11/22 0330  BP: 127/71 131/85  Pulse: 88 92  Resp: 10 15  Temp: 98.9 F (37.2 C)   SpO2: 95% 94%    CONSTITUTIONAL: Well-appearing, NAD NEURO/PSYCH:  Alert and oriented x 3, no focal deficits EYES:  eyes equal and reactive ENT/NECK:  no LAD, no JVD CARDIO: Regular rate, well-perfused, normal S1 and S2 PULM:  CTAB no wheezing or rhonchi GI/GU: Distended abdomen with moderate epigastric tenderness MSK/SPINE:  No gross deformities, no edema SKIN:  no rash, atraumatic   *Additional and/or pertinent findings included in MDM below  Diagnostic and Interventional Summary    EKG Interpretation  Date/Time:  Friday May 11 2022 03:26:20 EST  Ventricular Rate:  89 PR Interval:  171 QRS Duration: 106 QT Interval:  358 QTC Calculation: 436 R Axis:   36 Text Interpretation: Sinus rhythm Confirmed by Gerlene Fee [phone]) on 05/11/2022 4:44:42 AM       Labs Reviewed  COMPREHENSIVE METABOLIC PANEL - Abnormal; Notable for the following components:      Result Value   Glucose, Bld 123 (*)    AST 62 (*)    ALT 83 (*)    Total Bilirubin 1.4 (*)    All other components within normal limits  CBG MONITORING, ED - Abnormal; Notable for the following components:   Glucose-Capillary 125 (*)    All other components within normal limits  CBC WITH DIFFERENTIAL/PLATELET  LIPASE, BLOOD  URINALYSIS, ROUTINE W REFLEX MICROSCOPIC     CT ABDOMEN PELVIS W CONTRAST  Final Result    US Abdomen Limited RUQ (LIVER/GB)  Final Result      Medications  ondansetron (ZOFRAN-ODT) disintegrating tablet 4 mg (4 mg Oral Given 05/10/22 2004)  oxyCODONE-acetaminophen (PERCOCET/ROXICET) 5-325 MG per tablet 1 tablet (1 tablet Oral Given 05/10/22 2009)  lactated ringers bolus 1,000 mL (0 mLs Intravenous Stopped 05/11/22 0308)  lactated ringers bolus 1,000 mL (0 mLs Intravenous Stopped 05/11/22 0309)  HYDROmorphone (DILAUDID) injection 1 mg (1 mg Intravenous Given 05/11/22 0045)  iohexol (OMNIPAQUE) 350 MG/ML injection 100 mL (100 mLs Intravenous Contrast Given 05/11/22 0114)  HYDROmorphone (DILAUDID) injection 1 mg (1 mg Intravenous Given 05/11/22 0321)  famotidine (PEPCID) IVPB 20 mg premix (0 mg Intravenous Stopped 05/11/22 0413)  alum & mag hydroxide-simeth (MAALOX/MYLANTA) 200-200-20 MG/5ML suspension 30 mL (30 mLs Oral Given 05/11/22 0321)     Procedures  /  Critical Care Procedures  ED Course and Medical Decision Making  Initial Impression and Ddx Epigastric pain, nausea, vomiting, diarrhea, history of alcohol use disorder, pancreatitis.  Differential diagnosis includes pancreatitis, cholecystitis, small bowel obstruction given the abdominal distention.  Past medical/surgical history that increases complexity of ED encounter: Alcohol use disorder  Interpretation of Diagnostics I personally reviewed the EKG and my interpretation is as follows: Sinus rhythm  Labs reassuring with no significant blood count or electrolyte disturbance.  Ultrasound and CT imaging without emergent process, evidence of GERD.  Patient Reassessment and Ultimate Disposition/Management     Patient with some continued episodes of diarrhea here in the emergency department but feeling better.  Tolerating p.o.  Suspect gastroenteritis or GERD, appropriate for discharge.  Patient management required discussion with the following services or consulting groups:   None  Complexity of Problems Addressed Acute illness or injury that poses threat of life of bodily function  Additional Data Reviewed and Analyzed Further history obtained from: Past medical history and medications listed in the EMR, Prior ED visit notes, and Prior labs/imaging results  Additional Factors Impacting ED Encounter Risk Prescriptions  Jacques Popke. [place] Small, [place] [email or web address]  Final Clinical Impressions(s) / ED Diagnoses     ICD-10-CM   1. Epigastric pain  R10.13       ED Discharge Orders          Ordered    omeprazole (PRILOSEC) 20 MG capsule  Daily        05/11/22 0443    sucralfate (CARAFATE) 1 g tablet  4 times daily PRN        05/11/22 0443    ondansetron (ZOFRAN-ODT) 4 MG disintegrating tablet  Every 8 hours PRN  05/11/22 0443    loperamide (IMODIUM) 2 MG capsule  4 times daily PRN        05/11/22 0443             Discharge Instructions Discussed with and Provided to Patient:     Discharge Instructions      You were evaluated in the Emergency Department and after careful evaluation, we did not find any emergent condition requiring admission or further testing in the hospital.  Your exam/testing today is overall reassuring.  Use the Zofran as needed for nausea.  Use the omeprazole medication daily to prevent acid related pain.  Use the Carafate medication as needed for pain.  Use the Imodium as needed for diarrhea.  Please return to the Emergency Department if you experience any worsening of your condition.   Thank you for allowing [place] to be a part of your care.       Maudie Flakes, MD 05/11/22 [phone]

## 2022-05-11 NOTE — ED Provider Notes (Incomplete)
[facility] Emergency Department Provider Note MRN:  [id number]  Arrival date & time: 05/11/22     Chief Complaint   Abdominal Pain, Emesis, and Diarrhea   History of Present Illness   Kristopher [name] is a 52 y.o. year-old male with a history of alcohol use disorder, COPD, diabetes, hypertension, pancreatitis presenting to the ED with chief complaint of abdominal pain.  Persistent abdominal pain over the past 3 days.  Epigastric pain.  Associated with some nausea vomiting, some loose stools.  No fever.  Has not had any alcohol in about a year, proud of his recent sobriety.  No blood in stool or black stools.  Review of Systems  A thorough review of systems was obtained and all systems are negative except as noted in the HPI and PMH.   Patient's Health History    Past Medical History:  Diagnosis Date  . Alcohol dependence ([place])   . COPD (chronic obstructive pulmonary disease) ([place])   . Diabetes mellitus without complication ([place])   . GERD (gastroesophageal reflux disease)   . Hypertension   . Pancreatitis   . Pancreatitis     Past Surgical History:  Procedure Laterality Date  . ABDOMINAL SURGERY    . ESOPHAGOGASTRODUODENOSCOPY N/A 02/12/2020   Procedure: ESOPHAGOGASTRODUODENOSCOPY (EGD);  Surgeon: Toledo, Benay Pike, MD;  Location: [facility] ENDOSCOPY;  Service: Gastroenterology;  Laterality: N/A;    Family History  Problem Relation Age of Onset  . Diabetes Other     Social History   Socioeconomic History  . Marital status: Married    Spouse name: Not on file  . Number of children: Not on file  . Years of education: Not on file  . Highest education level: Not on file  Occupational History  . Not on file  Tobacco Use  . Smoking status: Every Day    Packs/day: 1.00    Types: Cigarettes  . Smokeless tobacco: Never  Vaping Use  . Vaping Use: Never used  Substance and Sexual Activity  . Alcohol use: Yes    Alcohol/week: 18.0 standard drinks of  alcohol    Types: 9 Cans of beer, 9 Shots of liquor per week    Comment: 5 hard lemonades a day  . Drug use: Not Currently    Types: Marijuana, Cocaine  . Sexual activity: Not on file  Other Topics Concern  . Not on file  Social History Narrative  . Not on file   Social Determinants of Health   Financial Resource Strain: Not on file  Food Insecurity: Not on file  Transportation Needs: Not on file  Physical Activity: Not on file  Stress: Not on file  Social Connections: Not on file  Intimate Partner Violence: Not on file     Physical Exam   Vitals:   05/10/22 1933 05/10/22 1938  BP: 114/85   Pulse: 91   Resp: 19   Temp: 98.9 F (37.2 C)   SpO2: 95% 95%    CONSTITUTIONAL: Well-appearing, NAD NEURO/PSYCH:  Alert and oriented x 3, no focal deficits EYES:  eyes equal and reactive ENT/NECK:  no LAD, no JVD CARDIO: Regular rate, well-perfused, normal S1 and S2 PULM:  CTAB no wheezing or rhonchi GI/GU: Distended abdomen with moderate epigastric tenderness MSK/SPINE:  No gross deformities, no edema SKIN:  no rash, atraumatic   *Additional and/or pertinent findings included in MDM below  Diagnostic and Interventional Summary    EKG Interpretation  Date/Time:    Ventricular Rate:  PR Interval:    QRS Duration:   QT Interval:    QTC Calculation:   R Axis:     Text Interpretation:         Labs Reviewed  COMPREHENSIVE METABOLIC PANEL - Abnormal; Notable for the following components:      Result Value   Glucose, Bld 123 (*)    AST 62 (*)    ALT 83 (*)    Total Bilirubin 1.4 (*)    All other components within normal limits  CBG MONITORING, ED - Abnormal; Notable for the following components:   Glucose-Capillary 125 (*)    All other components within normal limits  CBC WITH DIFFERENTIAL/PLATELET  LIPASE, BLOOD  URINALYSIS, ROUTINE W REFLEX MICROSCOPIC    US Abdomen Limited RUQ (LIVER/GB)  Final Result    CT ABDOMEN PELVIS W CONTRAST    (Results  Pending)    Medications  lactated ringers bolus 1,000 mL (has no administration in time range)  lactated ringers bolus 1,000 mL (has no administration in time range)  HYDROmorphone (DILAUDID) injection 1 mg (has no administration in time range)  ondansetron (ZOFRAN-ODT) disintegrating tablet 4 mg (4 mg Oral Given 05/10/22 2004)  oxyCODONE-acetaminophen (PERCOCET/ROXICET) 5-325 MG per tablet 1 tablet (1 tablet Oral Given 05/10/22 2009)     Procedures  /  Critical Care Procedures  ED Course and Medical Decision Making  Initial Impression and Ddx ***  Past medical/surgical history that increases complexity of ED encounter:  ***  Interpretation of Diagnostics I personally reviewed the {BEROINTERP:26835} and my interpretation is as follows:  ***  ***  Patient Reassessment and Ultimate Disposition/Management     ***  Patient management required discussion with the following services or consulting groups:  {BEROCONSULT:26841}  Complexity of Problems Addressed {BEROCOPA:26833}  Additional Data Reviewed and Analyzed Further history obtained from: {BERODATA:26834}  Additional Factors Impacting ED Encounter Risk [id number]  Barth Kirks. Sedonia Small, [place] [email or web address]  Final Clinical Impressions(s) / ED Diagnoses     ICD-10-CM   1. Epigastric pain  R10.13       ED Discharge Orders     None        Discharge Instructions Discussed with and Provided to Patient:   Discharge Instructions   None

## 2022-05-15 ENCOUNTER — Ambulatory Visit (HOSPITAL_COMMUNITY)
Admission: EM | Admit: 2022-05-15 | Discharge: 2022-05-15 | Disposition: A | Discharge status: Home / Self Care | Payer: 59 | Attending: Family Medicine | Admitting: Family Medicine

## 2022-05-15 ENCOUNTER — Encounter (HOSPITAL_COMMUNITY): Payer: Self-pay

## 2022-05-15 DIAGNOSIS — R112 Nausea with vomiting, unspecified: Secondary | ICD-10-CM

## 2022-05-15 DIAGNOSIS — R197 Diarrhea, unspecified: Secondary | ICD-10-CM | POA: Diagnosis not present

## 2022-05-15 DIAGNOSIS — R21 Rash and other nonspecific skin eruption: Secondary | ICD-10-CM

## 2022-05-15 DIAGNOSIS — L739 Follicular disorder, unspecified: Secondary | ICD-10-CM | POA: Diagnosis not present

## 2022-05-15 MED ORDER — DOXYCYCLINE HYCLATE 100 MG PO CAPS: 100.0000 mg | ORAL_CAPSULE | Freq: Two times a day (BID) | ORAL | 0 refills | Status: DC

## 2022-05-15 NOTE — ED Triage Notes (Signed)
Pt is here for vomiting and diarrhea and rash x 3days

## 2022-05-15 NOTE — ED Provider Notes (Signed)
[place]    CSN: [id number] Arrival date & time: 05/15/22  1106      History   Chief Complaint Chief Complaint  Patient presents with   Emesis   Diarrhea    HPI Kristopher [name] is a 52 y.o. male.   He felt poorly about 3 days ago.  Then with diarrhea and vomiting.  The vomiting has eased up, but the diarrhea was horrible.  That has improved.  He started with a rash right after the above symptoms.  Started on his neck and chest area, and then down the arms.  This is very itchy.  This has actually improved as well. He is using some otc cream but not sure what it actually was.  No fevers/chills.  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  No changes in anything topical.        Past Medical History:  Diagnosis Date   Alcohol dependence ([place])    COPD (chronic obstructive pulmonary disease) ([place])    Diabetes mellitus without complication ([place])    GERD (gastroesophageal reflux disease)    Hypertension    Pancreatitis    Pancreatitis     Patient Active Problem List   Diagnosis Date Noted   Tobacco abuse 02/10/2020   Acute on chronic pancreatitis ([place]) 02/09/2020   ETOH abuse 02/09/2020   Hemorrhoids 02/09/2020   Portal hypertension ([place]) 123456   Alcoholic cirrhosis of liver without ascites ([place]) 02/09/2020   Hypokalemia 02/09/2020   Abdominal pain 01/21/2019   Intractable nausea and vomiting 123456   Acute alcoholic pancreatitis 0000000    Past Surgical History:  Procedure Laterality Date   ABDOMINAL SURGERY     ESOPHAGOGASTRODUODENOSCOPY N/A 02/12/2020   Procedure: ESOPHAGOGASTRODUODENOSCOPY (EGD);  Surgeon: Toledo, Benay Pike, MD;  Location: [facility] ENDOSCOPY;  Service: Gastroenterology;  Laterality: N/A;       Home Medications    Prior to Admission medications   Medication Sig Start Date End Date Taking? Authorizing Provider  lisinopril (ZESTRIL) 20 MG tablet Take 1 tablet (20 mg total) by mouth  daily. 02/13/20  Yes Lorella Nimrod, MD  loperamide (IMODIUM) 2 MG capsule Take 1 capsule (2 mg total) by mouth 4 (four) times daily as needed for diarrhea or loose stools. 05/11/22  Yes Maudie Flakes, MD  omeprazole (PRILOSEC) 20 MG capsule Take 1 capsule (20 mg total) by mouth daily. 05/11/22  Yes Maudie Flakes, MD  ondansetron (ZOFRAN-ODT) 4 MG disintegrating tablet Take 1 tablet (4 mg total) by mouth every 8 (eight) hours as needed for nausea or vomiting. 05/10/22  Yes Raspet, Beryl Balz K, PA-C  ondansetron (ZOFRAN-ODT) 4 MG disintegrating tablet Take 1 tablet (4 mg total) by mouth every 8 (eight) hours as needed for nausea or vomiting. 05/11/22  Yes Maudie Flakes, MD  albuterol (VENTOLIN HFA) 108 (90 Base) MCG/ACT inhaler Inhale 2 puffs into the lungs every 6 (six) hours as needed for wheezing or shortness of breath.    [provider]  amLODipine (NORVASC) 10 MG tablet Take 1 tablet (10 mg total) by mouth daily. 05/10/22   Raspet, Junie Panning K, PA-C  fluticasone (FLONASE) 50 MCG/ACT nasal spray Place 1 spray into both nostrils daily.    [provider]  fluticasone furoate-vilanterol (BREO ELLIPTA) 200-25 MCG/ACT AEPB INHALE 1 PUFF INTO THE LUNGS ONCE DAILY. 12/04/21     Fluticasone-Salmeterol (ADVAIR) 500-50 MCG/DOSE AEPB Inhale 1 puff into the lungs 2 (two) times daily.  [provider]  folic acid (FOLVITE) 1 MG tablet Take 1 tablet (1 mg total) by mouth daily. 02/13/20   Lorella Nimrod, MD  folic acid (FOLVITE) 1 MG tablet TAKE 1 TABLET BY MOUTH ONCE DAILY. 12/04/21     gabapentin (NEURONTIN) 300 MG capsule Take 300 mg by mouth 3 (three) times daily.    [provider]  hydrOXYzine (ATARAX) 50 MG tablet TAKE 1 TABLET BY MOUTH ONCE AT BEDTIME AS NEEDED FOR SLEEP. 12/04/21     lactulose (CHRONULAC) 10 GM/15ML solution Take 30 mLs (20 g total) by mouth 2 (two) times daily. 02/13/20   Lorella Nimrod, MD  levothyroxine (SYNTHROID) 125 MCG tablet Take 1 tablet (125 mcg total)  by mouth daily at 6 (six) AM. 02/14/20   Lorella Nimrod, MD  lisinopril (ZESTRIL) 10 MG tablet Take 1 tablet (10 mg total) by mouth daily. 12/04/21     melatonin 3 MG TABS tablet Take 3 mg by mouth at bedtime as needed (sleep).    [provider]  metFORMIN (GLUCOPHAGE) 500 MG tablet Take 500 mg by mouth 2 (two) times daily with a meal.    [provider]  methocarbamol (ROBAXIN) 500 MG tablet TAKE 2 TABLETS BY MOUTH (3) TIMES DAILY FOR 14 DAYS. 12/04/21     metoCLOPramide (REGLAN) 5 MG tablet Take 1 tablet (5 mg total) by mouth every 8 (eight) hours as needed for nausea or vomiting. 02/13/20   Lorella Nimrod, MD  Multiple Vitamin (MULTIVITAMIN WITH MINERALS) TABS tablet Take 1 tablet by mouth daily. 02/13/20   Lorella Nimrod, MD  nicotine (NICODERM CQ - DOSED IN MG/24 HOURS) 21 mg/24hr patch Place 1 patch (21 mg total) onto the skin daily. 02/13/20   Lorella Nimrod, MD  oxyCODONE (OXYCONTIN) 10 mg 12 hr tablet TAKE 1 TABLET BY MOUTH EVERY (12) HOURS. 12/04/21     Oxycodone HCl 10 MG TABS TAKE 1 TABLET BY MOUTH EVERY (4) HOURS AS NEDED FOR PAIN (SCALE 7-10). 12/04/21     pantoprazole (PROTONIX) 40 MG tablet Take 40 mg by mouth daily.    [provider]  pantoprazole (PROTONIX) 40 MG tablet Take 1 tablet (40 mg total) by mouth daily. 09/10/21 10/10/21  Rada Hay, MD  pregabalin (LYRICA) 200 MG capsule TAKE 1 CAPSULE BY MOUTH (3) TIMES DAILY. 12/04/21     sucralfate (CARAFATE) 1 g tablet Take 1 tablet (1 g total) by mouth 4 (four) times daily as needed. 05/11/22   Maudie Flakes, MD  sucralfate (CARAFATE) 1 GM/10ML suspension Take 10 mLs (1 g total) by mouth 4 (four) times daily -  with meals and at bedtime. 02/13/20   Lorella Nimrod, MD  tamsulosin (FLOMAX) 0.4 MG CAPS capsule TAKE 1 CAPSULE BY MOUTH ONCE DAILY WITH DINNER. 12/04/21     thiamine 100 MG tablet Take 1 tablet (100 mg total) by mouth daily. 02/13/20   Lorella Nimrod, MD  vitamin B-12 1000 MCG tablet Take 1 tablet  (1,000 mcg total) by mouth daily. 02/13/20   Lorella Nimrod, MD    Family History Family History  Problem Relation Age of Onset   Diabetes Other     Social History Social History   Tobacco Use   Smoking status: Every Day    Packs/day: 1.00    Types: Cigarettes   Smokeless tobacco: Never  Vaping Use   Vaping Use: Never used  Substance Use Topics   Alcohol use: Yes    Alcohol/week: 18.0 standard drinks of alcohol  Types: 9 Cans of beer, 9 Shots of liquor per week    Comment: 5 hard lemonades a day   Drug use: Not Currently    Types: Marijuana, Cocaine     Allergies   Patient has no known allergies.   Review of Systems Review of Systems  Constitutional: Negative.   HENT: Negative.    Respiratory: Negative.    Cardiovascular: Negative.   Gastrointestinal:  Positive for diarrhea and vomiting.  Musculoskeletal: Negative.   Skin:  Positive for rash.  Psychiatric/Behavioral: Negative.       Physical Exam Triage Vital Signs ED Triage Vitals  Enc Vitals Group     BP 05/15/22 1234 (!) 144/83     Pulse Rate 05/15/22 1234 85     Resp 05/15/22 1234 16     Temp 05/15/22 1234 97.6 F (36.4 C)     Temp Source 05/15/22 1234 Oral     SpO2 05/15/22 1234 96 %     Weight --      Height --      Head Circumference --      Peak Flow --      Pain Score 05/15/22 1236 0     Pain Loc --      Pain Edu? --      Excl. in [place]? --    No data found.  Updated Vital Signs BP (!) 144/83 (BP Location: Right Arm)   Pulse 85   Temp 97.6 F (36.4 C) (Oral)   Resp 16   SpO2 96%   Visual Acuity Right Eye Distance:   Left Eye Distance:   Bilateral Distance:    Right Eye Near:   Left Eye Near:    Bilateral Near:     Physical Exam Constitutional:      Appearance: Normal appearance.  Cardiovascular:     Rate and Rhythm: Normal rate and regular rhythm.  Pulmonary:     Effort: Pulmonary effort is normal.     Breath sounds: Normal breath sounds.  Abdominal:     Palpations:  Abdomen is soft.     Tenderness: There is no abdominal tenderness. There is no guarding or rebound.  Skin:    Comments: Follicular like rash at the back of the neck, left upper back, and across the chest;   Neurological:     General: No focal deficit present.     Mental Status: He is alert.  Psychiatric:        Mood and Affect: Mood normal.      UC Treatments / Results  Labs (all labs ordered are listed, but only abnormal results are displayed) Labs Reviewed - No data to display  EKG   Radiology No results found.  Procedures Procedures (including critical care time)  Medications Ordered in UC Medications - No data to display  Initial Impression / Assessment and Plan / UC Course  I have reviewed the triage vital signs and the nursing notes.  Pertinent labs & imaging results that were available during my care of the patient were reviewed by me and considered in my medical decision making (see chart for details).  Final Clinical Impressions(s) / UC Diagnoses   Final diagnoses:  Nausea vomiting and diarrhea  Rash and nonspecific skin eruption  Folliculitis     Discharge Instructions      You were seen today for gastroenteritis and rash.  The gastroenteritis is resolving.  Continue to push fluids.  I have sent out an antibiotic to help with the  rash.   If not improving then please return for further evaluation.     ED Prescriptions     Medication Sig Dispense Auth. Provider   doxycycline (VIBRAMYCIN) 100 MG capsule Take 1 capsule (100 mg total) by mouth 2 (two) times daily. 20 capsule Rondel Oh, MD      PDMP not reviewed this encounter.   Rondel Oh, MD 05/15/22 [phone]

## 2022-05-15 NOTE — Discharge Instructions (Signed)
You were seen today for gastroenteritis and rash.  The gastroenteritis is resolving.  Continue to push fluids.  I have sent out an antibiotic to help with the rash.   If not improving then please return for further evaluation.

## 2022-06-25 ENCOUNTER — Ambulatory Visit (HOSPITAL_COMMUNITY)
Admission: EM | Admit: 2022-06-25 | Discharge: 2022-06-25 | Disposition: A | Discharge status: Home / Self Care | Payer: 59 | Attending: Emergency Medicine | Admitting: Emergency Medicine

## 2022-06-25 ENCOUNTER — Encounter (HOSPITAL_COMMUNITY): Payer: Self-pay | Admitting: Emergency Medicine

## 2022-06-25 DIAGNOSIS — J9801 Acute bronchospasm: Secondary | ICD-10-CM

## 2022-06-25 DIAGNOSIS — J069 Acute upper respiratory infection, unspecified: Secondary | ICD-10-CM | POA: Diagnosis not present

## 2022-06-25 MED ORDER — GUAIFENESIN ER 600 MG PO TB12: 600.0000 mg | ORAL_TABLET | Freq: Two times a day (BID) | ORAL | 0 refills | Status: AC

## 2022-06-25 MED ORDER — ALBUTEROL SULFATE HFA 108 (90 BASE) MCG/ACT IN AERS: 1.0000 | INHALATION_SPRAY | Freq: Four times a day (QID) | RESPIRATORY_TRACT | 2 refills | Status: AC | PRN

## 2022-06-25 MED ORDER — BENZONATATE 100 MG PO CAPS: 100.0000 mg | ORAL_CAPSULE | Freq: Three times a day (TID) | ORAL | 0 refills | Status: DC | PRN

## 2022-06-25 NOTE — Discharge Instructions (Addendum)
I recommend to take mucinex (guaifenesin) twice daily. Make sure you are drinking lots of water with this medication! Coupon attached you can hand to pharmacist.  You can take the cough medicine (tessalon) 3x daily. If the medication makes you drowsy, take only at bed time.  Use the inhaler every 6 hours (3x daily) for the rest of the week. Then continue as needed.  Please return if symptoms persist or worsen.

## 2022-06-25 NOTE — ED Provider Notes (Signed)
[place]    CSN: [id number] Arrival date & time: 06/25/22  1329      History   Chief Complaint Chief Complaint  Patient presents with   Cough   Nasal Congestion    HPI Kristopher [name] is a 52 y.o. male.  Here with 1 day history of cough, congestion, body aches Overall feeling tired, run down Work is requiring a note  He has tried mucinex and tylenol so far No fevers, chills, abd pain, NVD Denies CP or shortness of breath  Possible history of COPD although nothing recent in his chart. He does not use any inhalers at home.  Quit smoking 3 months ago.  Past Medical History:  Diagnosis Date   Alcohol dependence    COPD (chronic obstructive pulmonary disease)    Diabetes mellitus without complication    GERD (gastroesophageal reflux disease)    Hypertension    Pancreatitis    Pancreatitis     Patient Active Problem List   Diagnosis Date Noted   Tobacco abuse 02/10/2020   Acute on chronic pancreatitis 02/09/2020   ETOH abuse 02/09/2020   Hemorrhoids 02/09/2020   Portal hypertension 123456   Alcoholic cirrhosis of liver without ascites 02/09/2020   Hypokalemia 02/09/2020   Abdominal pain 01/21/2019   Intractable nausea and vomiting 123456   Acute alcoholic pancreatitis 0000000    Past Surgical History:  Procedure Laterality Date   ABDOMINAL SURGERY     ESOPHAGOGASTRODUODENOSCOPY N/A 02/12/2020   Procedure: ESOPHAGOGASTRODUODENOSCOPY (EGD);  Surgeon: Tol[place], Benay Pike, MD;  Location: [facility] ENDOSCOPY;  Service: Gastroenterology;  Laterality: N/A;       Home Medications    Prior to Admission medications   Medication Sig Start Date End Date Taking? Authorizing Provider  albuterol (VENTOLIN HFA) 108 (90 Base) MCG/ACT inhaler Inhale 1-2 puffs into the lungs every 6 (six) hours as needed for wheezing or shortness of breath. 06/25/22  Yes Ugochi Henzler, Wells Guiles, PA-C  benzonatate (TESSALON) 100 MG capsule Take 1 capsule (100 mg total) by mouth 3  (three) times daily as needed for cough. 06/25/22  Yes Koltin Wehmeyer, Wells Guiles, PA-C  guaiFENesin (MUCINEX) 600 MG 12 hr tablet Take 1 tablet (600 mg total) by mouth 2 (two) times daily for 5 days. 06/25/22 06/30/22 Yes Dshawn Mcnay, Wells Guiles, PA-C  Multiple Vitamin (MULTIVITAMIN WITH MINERALS) TABS tablet Take 1 tablet by mouth daily. 02/13/20   Lorella Nimrod, MD    Family History Family History  Problem Relation Age of Onset   Diabetes Other     Social History Social History   Tobacco Use   Smoking status: Every Day    Packs/day: 1    Types: Cigarettes   Smokeless tobacco: Never  Vaping Use   Vaping Use: Never used  Substance Use Topics   Alcohol use: Yes    Alcohol/week: 18.0 standard drinks of alcohol    Types: 9 Cans of beer, 9 Shots of liquor per week    Comment: 5 hard lemonades a day   Drug use: Not Currently    Types: Marijuana, Cocaine     Allergies   Patient has no known allergies.   Review of Systems Review of Systems  As per HPI  Physical Exam Triage Vital Signs ED Triage Vitals  Enc Vitals Group     BP 06/25/22 1540 134/87     Pulse Rate 06/25/22 1540 81     Resp 06/25/22 1540 18     Temp 06/25/22 1540 98 F (36.7 C)  Temp Source 06/25/22 1540 Oral     SpO2 06/25/22 1540 96 %     Weight --      Height --      Head Circumference --      Peak Flow --      Pain Score 06/25/22 1538 6     Pain Loc --      Pain Edu? --      Excl. in [place]? --    No data found.  Updated Vital Signs BP 134/87 (BP Location: Right Arm)   Pulse 81   Temp 98 F (36.7 C) (Oral)   Resp 18   SpO2 96%   Physical Exam Vitals and nursing note reviewed.  Constitutional:      General: He is not in acute distress. HENT:     Nose: Congestion and rhinorrhea present.     Mouth/Throat:     Mouth: Mucous membranes are moist.     Pharynx: Oropharynx is clear. No posterior oropharyngeal erythema.  Eyes:     Conjunctiva/sclera: Conjunctivae normal.  Cardiovascular:     Rate and Rhythm:  Normal rate and regular rhythm.     Pulses: Normal pulses.     Heart sounds: Normal heart sounds.  Pulmonary:     Effort: Pulmonary effort is normal.     Breath sounds: Wheezing present.     Comments: Very faint end-expiratory wheezes. Normal work of breathing Musculoskeletal:     Cervical back: Normal range of motion.  Lymphadenopathy:     Cervical: No cervical adenopathy.  Skin:    General: Skin is warm and dry.  Neurological:     Mental Status: He is alert and oriented to person, place, and time.     UC Treatments / Results  Labs (all labs ordered are listed, but only abnormal results are displayed) Labs Reviewed - No data to display  EKG   Radiology No results found.  Procedures Procedures (including critical care time)  Medications Ordered in UC Medications - No data to display  Initial Impression / Assessment and Plan / UC Course  I have reviewed the triage vital signs and the nursing notes.  Pertinent labs & imaging results that were available during my care of the patient were reviewed by me and considered in my medical decision making (see chart for details).  Overall well appearing, afebrile.  Viral URI with cough, reactive airway Albuterol inhaler q6 hours prn. Tessalon TID prn. Mucinex 600 mg BID Increase fluids, can try nasal spray, discussed other symptomatic care Strict return and ED precautions  Final Clinical Impressions(s) / UC Diagnoses   Final diagnoses:  Viral URI with cough  Bronchospasm     Discharge Instructions      I recommend to take mucinex (guaifenesin) twice daily. Make sure you are drinking lots of water with this medication! Coupon attached you can hand to pharmacist.  You can take the cough medicine (tessalon) 3x daily. If the medication makes you drowsy, take only at bed time.  Use the inhaler every 6 hours (3x daily) for the rest of the week. Then continue as needed.  Please return if symptoms persist or worsen.        ED Prescriptions     Medication Sig Dispense Auth. Provider   albuterol (VENTOLIN HFA) 108 (90 Base) MCG/ACT inhaler Inhale 1-2 puffs into the lungs every 6 (six) hours as needed for wheezing or shortness of breath. 8 g Taniaya Rudder, PA-C   benzonatate (TESSALON) 100 MG capsule  Take 1 capsule (100 mg total) by mouth 3 (three) times daily as needed for cough. 21 capsule Krissi Willaims, PA-C   guaiFENesin (MUCINEX) 600 MG 12 hr tablet Take 1 tablet (600 mg total) by mouth 2 (two) times daily for 5 days. 10 tablet Leo Weyandt, Wells Guiles, PA-C      PDMP not reviewed this encounter.   Les [place], [place] 06/25/22 1658

## 2022-06-25 NOTE — ED Triage Notes (Signed)
Pt c/o cough, congestion and body aches since yesterday took tylenol and mucinex.

## 2022-08-27 ENCOUNTER — Encounter (HOSPITAL_COMMUNITY): Payer: Self-pay

## 2022-08-27 ENCOUNTER — Emergency Department (HOSPITAL_COMMUNITY): Payer: 59

## 2022-08-27 ENCOUNTER — Inpatient Hospital Stay (HOSPITAL_COMMUNITY)
Admission: EM | Admit: 2022-08-27 | Discharge: 2022-08-31 | DRG: 552 | Disposition: A | Discharge status: Home Health | Payer: 59 | Attending: Internal Medicine | Admitting: Internal Medicine

## 2022-08-27 ENCOUNTER — Other Ambulatory Visit: Payer: Self-pay

## 2022-08-27 DIAGNOSIS — J449 Chronic obstructive pulmonary disease, unspecified: Secondary | ICD-10-CM | POA: Insufficient documentation

## 2022-08-27 DIAGNOSIS — M47816 Spondylosis without myelopathy or radiculopathy, lumbar region: Secondary | ICD-10-CM | POA: Diagnosis present

## 2022-08-27 DIAGNOSIS — M542 Cervicalgia: Secondary | ICD-10-CM | POA: Diagnosis not present

## 2022-08-27 DIAGNOSIS — M4722 Other spondylosis with radiculopathy, cervical region: Secondary | ICD-10-CM | POA: Diagnosis not present

## 2022-08-27 DIAGNOSIS — Z91128 Patient's intentional underdosing of medication regimen for other reason: Secondary | ICD-10-CM

## 2022-08-27 DIAGNOSIS — Z6836 Body mass index (BMI) 36.0-36.9, adult: Secondary | ICD-10-CM

## 2022-08-27 DIAGNOSIS — F1010 Alcohol abuse, uncomplicated: Secondary | ICD-10-CM | POA: Diagnosis present

## 2022-08-27 DIAGNOSIS — Z79899 Other long term (current) drug therapy: Secondary | ICD-10-CM

## 2022-08-27 DIAGNOSIS — Z91041 Radiographic dye allergy status: Secondary | ICD-10-CM

## 2022-08-27 DIAGNOSIS — E119 Type 2 diabetes mellitus without complications: Secondary | ICD-10-CM | POA: Diagnosis present

## 2022-08-27 DIAGNOSIS — R52 Pain, unspecified: Secondary | ICD-10-CM | POA: Diagnosis present

## 2022-08-27 DIAGNOSIS — F17210 Nicotine dependence, cigarettes, uncomplicated: Secondary | ICD-10-CM | POA: Diagnosis present

## 2022-08-27 DIAGNOSIS — M4802 Spinal stenosis, cervical region: Secondary | ICD-10-CM | POA: Diagnosis present

## 2022-08-27 DIAGNOSIS — M5126 Other intervertebral disc displacement, lumbar region: Secondary | ICD-10-CM | POA: Diagnosis present

## 2022-08-27 DIAGNOSIS — E871 Hypo-osmolality and hyponatremia: Secondary | ICD-10-CM | POA: Insufficient documentation

## 2022-08-27 DIAGNOSIS — E669 Obesity, unspecified: Secondary | ICD-10-CM | POA: Diagnosis present

## 2022-08-27 DIAGNOSIS — Z833 Family history of diabetes mellitus: Secondary | ICD-10-CM

## 2022-08-27 DIAGNOSIS — R531 Weakness: Secondary | ICD-10-CM

## 2022-08-27 DIAGNOSIS — K219 Gastro-esophageal reflux disease without esophagitis: Secondary | ICD-10-CM | POA: Diagnosis present

## 2022-08-27 DIAGNOSIS — I10 Essential (primary) hypertension: Secondary | ICD-10-CM | POA: Insufficient documentation

## 2022-08-27 DIAGNOSIS — R112 Nausea with vomiting, unspecified: Secondary | ICD-10-CM

## 2022-08-27 DIAGNOSIS — J438 Other emphysema: Secondary | ICD-10-CM | POA: Insufficient documentation

## 2022-08-27 LAB — RAPID URINE DRUG SCREEN, HOSP PERFORMED
Amphetamines: NOT DETECTED
Barbiturates: NOT DETECTED
Benzodiazepines: NOT DETECTED
Cocaine: NOT DETECTED
Opiates: POSITIVE — AB
Tetrahydrocannabinol: NOT DETECTED

## 2022-08-27 LAB — COMPREHENSIVE METABOLIC PANEL
ALT: 28 U/L (ref 0–44)
AST: 26 U/L (ref 15–41)
Albumin: 3.9 g/dL (ref 3.5–5.0)
Alkaline Phosphatase: 48 U/L (ref 38–126)
Anion gap: 9 (ref 5–15)
BUN: 8 mg/dL (ref 6–20)
CO2: 24 mmol/L (ref 22–32)
Calcium: 9 mg/dL (ref 8.9–10.3)
Chloride: 100 mmol/L (ref 98–111)
Creatinine, Ser: 0.8 mg/dL (ref 0.61–1.24)
GFR, Estimated: >60 mL/min (ref >60)
Glucose, Bld: 142 mg/dL — ABNORMAL HIGH (ref 70–99)
Potassium: 3.9 mmol/L (ref 3.5–5.1)
Sodium: 133 mmol/L — ABNORMAL LOW (ref 135–145)
Total Bilirubin: 0.7 mg/dL (ref 0.3–1.2)
Total Protein: 7.4 g/dL (ref 6.5–8.1)

## 2022-08-27 LAB — URINALYSIS, W/ REFLEX TO CULTURE (INFECTION SUSPECTED)
Bacteria, UA: NONE SEEN
Bilirubin Urine: NEGATIVE
Glucose, UA: NEGATIVE mg/dL
Hgb urine dipstick: NEGATIVE
Ketones, ur: NEGATIVE mg/dL
Leukocytes,Ua: NEGATIVE
Nitrite: NEGATIVE
Protein, ur: NEGATIVE mg/dL
Specific Gravity, Urine: 1.011 (ref 1.005–1.030)
pH: 5 (ref 5.0–8.0)

## 2022-08-27 LAB — CBC WITH DIFFERENTIAL/PLATELET
Abs Immature Granulocytes: 0.02 10*3/uL (ref 0.00–0.07)
Basophils Absolute: 0.1 10*3/uL (ref 0.0–0.1)
Basophils Relative: 1 %
Eosinophils Absolute: 0.1 10*3/uL (ref 0.0–0.5)
Eosinophils Relative: 2 %
HCT: 46.1 % (ref 39.0–52.0)
Hemoglobin: 15.6 g/dL (ref 13.0–17.0)
Immature Granulocytes: 0 %
Lymphocytes Relative: 32 %
Lymphs Abs: 2.5 10*3/uL (ref 0.7–4.0)
MCH: 28.6 pg (ref 26.0–34.0)
MCHC: 33.8 g/dL (ref 30.0–36.0)
MCV: 84.6 fL (ref 80.0–100.0)
Monocytes Absolute: 0.4 10*3/uL (ref 0.1–1.0)
Monocytes Relative: 5 %
Neutro Abs: 4.6 10*3/uL (ref 1.7–7.7)
Neutrophils Relative %: 60 %
Platelets: 221 10*3/uL (ref 150–400)
RBC: 5.45 MIL/uL (ref 4.22–5.81)
RDW: 13.9 % (ref 11.5–15.5)
WBC: 7.6 10*3/uL (ref 4.0–10.5)
nRBC: 0 % (ref 0.0–0.2)

## 2022-08-27 LAB — MAGNESIUM: Magnesium: 1.9 mg/dL (ref 1.7–2.4)

## 2022-08-27 MED ORDER — ACETAMINOPHEN 650 MG RE SUPP: 650.0000 mg | Freq: Four times a day (QID) | RECTAL | Status: DC | PRN

## 2022-08-27 MED ORDER — MORPHINE SULFATE (PF) 4 MG/ML IV SOLN
4.0000 mg | Freq: Once | INTRAVENOUS | Status: AC
  Administered 2022-08-27: 4 mg via INTRAVENOUS
  Filled 2022-08-27: qty 1

## 2022-08-27 MED ORDER — FENTANYL CITRATE PF 50 MCG/ML IJ SOSY
50.0000 ug | PREFILLED_SYRINGE | Freq: Once | INTRAMUSCULAR | Status: AC
  Administered 2022-08-27: 50 ug via INTRAVENOUS
  Filled 2022-08-27: qty 1

## 2022-08-27 MED ORDER — FENTANYL CITRATE PF 50 MCG/ML IJ SOSY
12.5000 ug | PREFILLED_SYRINGE | INTRAMUSCULAR | Status: DC | PRN
  Administered 2022-08-28 (×4): 50 ug via INTRAVENOUS
  Administered 2022-08-28: 12.5 ug via INTRAVENOUS
  Administered 2022-08-28 – 2022-08-29 (×3): 50 ug via INTRAVENOUS
  Filled 2022-08-27 (×9): qty 1

## 2022-08-27 MED ORDER — ACETAMINOPHEN 325 MG PO TABS
650.0000 mg | ORAL_TABLET | Freq: Four times a day (QID) | ORAL | Status: DC | PRN
  Administered 2022-08-28 – 2022-08-31 (×3): 650 mg via ORAL
  Filled 2022-08-27 (×3): qty 2

## 2022-08-27 MED ORDER — OXYCODONE HCL 5 MG PO TABS
5.0000 mg | ORAL_TABLET | ORAL | Status: DC | PRN
  Administered 2022-08-27 – 2022-08-28 (×4): 5 mg via ORAL
  Filled 2022-08-27 (×4): qty 1

## 2022-08-27 MED ORDER — MORPHINE SULFATE (PF) 4 MG/ML IV SOLN
6.0000 mg | Freq: Once | INTRAVENOUS | Status: AC
  Administered 2022-08-27: 6 mg via INTRAVENOUS
  Filled 2022-08-27: qty 2

## 2022-08-27 MED ORDER — KETAMINE HCL 50 MG/5ML IJ SOSY
0.2500 mg/kg | PREFILLED_SYRINGE | Freq: Once | INTRAMUSCULAR | Status: AC
  Administered 2022-08-27: 19 mg via INTRAVENOUS
  Filled 2022-08-27: qty 5

## 2022-08-27 NOTE — ED Notes (Signed)
ED TO INPATIENT HANDOFF REPORT  ED Nurse Name and Phone #: [phone] Pennie Rushing A. Nova Evett, RN  S Name/Age/Gender [name] 52 y.o. male Room/Bed: 018C/018C  Code Status   Code Status: Full Code  Home/SNF/Other Home Patient oriented to: self, place, time, and situation Is this baseline? 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   Allergies Allergies  Allergen Reactions   Ivp Dye [Iodinated Contrast Media] Anaphylaxis    Level of Care/Admitting Diagnosis ED Disposition     ED Disposition  Admit   Condition  --   Comment  Hospital Area: [facility] [[id number]]  Level of Care: Med-Surg [16]  May place patient in observation at [facility] or [facility] if equivalent level of care is available:: Yes  Covid Evaluation: Asymptomatic - no recent exposure (last 10 days) testing not required  Diagnosis: Intractable pain [708987]  Admitting Physician: Alan Mulder [[id number]]  Attending Physician: Alan Mulder [[id number]]          B Medical/Surgery History Past Medical History:  Diagnosis Date   Alcohol dependence (HCC)    COPD (chronic obstructive pulmonary disease) (HCC)    Diabetes mellitus without complication (HCC)    GERD (gastroesophageal reflux disease)    Hypertension    Pancreatitis    Pancreatitis    Past Surgical History:  Procedure Laterality Date   ABDOMINAL SURGERY     ESOPHAGOGASTRODUODENOSCOPY N/A 02/12/2020   Procedure: ESOPHAGOGASTRODUODENOSCOPY (EGD);  Surgeon: Toledo, Boykin Nearing, MD;  Location: [facility] ENDOSCOPY;  Service: Gastroenterology;  Laterality: N/A;     A IV Location/Drains/Wounds Patient Lines/Drains/Airways Status     Active Line/Drains/Airways     Name Placement date Placement time Site Days    Peripheral IV 08/27/22 20 G Right Antecubital 08/27/22  1647  Antecubital  less than 1            Intake/Output Last 24 hours  Intake/Output Summary (Last 24 hours) at 08/27/2022 2349 Last data filed at 08/27/2022 1814 Gross per 24 hour  Intake --  Output 500 ml  Net -500 ml    Labs/Imaging Results for orders placed or performed during the hospital encounter of 08/27/22 (from the past 48 hour(s))  CBC with Differential     Status: None   Collection Time: 08/27/22  4:33 PM  Result Value Ref Range   WBC 7.6 4.0 - 10.5 K/uL   RBC 5.45 4.22 - 5.81 MIL/uL   Hemoglobin 15.6 13.0 - 17.0 g/dL   HCT 13.0 86.5 - 78.4 %   MCV 84.6 80.0 - 100.0 fL   MCH 28.6 26.0 - 34.0 pg   MCHC 33.8 30.0 - 36.0 g/dL   RDW 69.6 29.5 - 28.4 %   Platelets 221 150 - 400 K/uL   nRBC 0.0 0.0 - 0.2 %   Neutrophils Relative % 60 %   Neutro Abs 4.6 1.7 - 7.7 K/uL   Lymphocytes Relative 32 %   Lymphs Abs 2.5 0.7 - 4.0 K/uL   Monocytes Relative 5 %   Monocytes Absolute 0.4 0.1 - 1.0 K/uL   Eosinophils Relative 2 %   Eosinophils Absolute 0.1 0.0 - 0.5 K/uL   Basophils Relative 1 %   Basophils Absolute 0.1 0.0 - 0.1 K/uL   Immature Granulocytes 0 %  Abs Immature Granulocytes 0.02 0.00 - 0.07 K/uL    Comment: Performed at [facility], [address], [place], [place] [address]  Comprehensive metabolic panel     Status: Abnormal   Collection Time: 08/27/22  4:33 PM  Result Value Ref Range   Sodium 133 (L) 135 - 145 mmol/L   Potassium 3.9 3.5 - 5.1 mmol/L   Chloride 100 98 - 111 mmol/L   CO2 24 22 - 32 mmol/L   Glucose, Bld 142 (H) 70 - 99 mg/dL    Comment: Glucose reference range applies only to samples taken after fasting for at least 8 hours.   BUN 8 6 - 20 mg/dL   Creatinine, Ser 6.04 0.61 - 1.24 mg/dL   Calcium 9.0 8.9 - 54.0 mg/dL   Total Protein 7.4 6.5 - 8.1 g/dL   Albumin 3.9 3.5 - 5.0 g/dL   AST 26 15 - 41 U/L   ALT 28 0 - 44 U/L   Alkaline Phosphatase 48 38 - 126 U/L   Total  Bilirubin 0.7 0.3 - 1.2 mg/dL   GFR, Estimated >98 >11 mL/min    Comment: (NOTE) Calculated using the CKD-EPI Creatinine Equation (2021)    Anion gap 9 5 - 15    Comment: Performed at [facility], [address] [place], [place] [address]  Magnesium     Status: None   Collection Time: 08/27/22  4:33 PM  Result Value Ref Range   Magnesium 1.9 1.7 - 2.4 mg/dL    Comment: Performed at [facility], [address], [place], [place] [address]  Urinalysis, w/ Reflex to Culture (Infection Suspected) -Urine, Clean Catch     Status: None   Collection Time: 08/27/22  6:05 PM  Result Value Ref Range   Specimen Source URINE, CLEAN CATCH    Color, Urine YELLOW YELLOW   APPearance CLEAR CLEAR   Specific Gravity, Urine 1.011 1.005 - 1.030   pH 5.0 5.0 - 8.0   Glucose, UA NEGATIVE NEGATIVE mg/dL   Hgb urine dipstick NEGATIVE NEGATIVE   Bilirubin Urine NEGATIVE NEGATIVE   Ketones, ur NEGATIVE NEGATIVE mg/dL   Protein, ur NEGATIVE NEGATIVE mg/dL   Nitrite NEGATIVE NEGATIVE   Leukocytes,Ua NEGATIVE NEGATIVE   RBC / HPF 0-5 0 - 5 RBC/hpf   WBC, UA 0-5 0 - 5 WBC/hpf    Comment:        Reflex urine culture not performed if WBC <=10, OR if Squamous epithelial cells >5. If Squamous epithelial cells >5 suggest recollection.    Bacteria, UA NONE SEEN NONE SEEN   Squamous Epithelial / HPF 0-5 0 - 5 /HPF   Mucus PRESENT     Comment: Performed at [facility], [address], [place], [place] [address]  Rapid urine drug screen (hospital performed)     Status: Abnormal   Collection Time: 08/27/22  6:05 PM  Result Value Ref Range   Opiates POSITIVE (A) NONE DETECTED   Cocaine NONE DETECTED NONE DETECTED   Benzodiazepines NONE DETECTED NONE DETECTED   Amphetamines NONE DETECTED NONE DETECTED   Tetrahydrocannabinol NONE DETECTED NONE DETECTED   Barbiturates NONE DETECTED NONE DETECTED    Comment: (NOTE) DRUG SCREEN FOR MEDICAL PURPOSES ONLY.  IF CONFIRMATION IS NEEDED FOR ANY  PURPOSE, NOTIFY LAB WITHIN 5 DAYS.  LOWEST DETECTABLE LIMITS FOR URINE DRUG SCREEN Drug Class  Cutoff (ng/mL) Amphetamine and metabolites    1000 Barbiturate and metabolites    200 Benzodiazepine                 200 Opiates and metabolites        300 Cocaine and metabolites        300 THC                            50 Performed at [facility], [address], [place], [place] [address]    MR Cervical Spine Wo Contrast  Result Date: 08/27/2022 CLINICAL DATA:  Weakness EXAM: MRI CERVICAL, THORACIC AND LUMBAR SPINE WITHOUT CONTRAST TECHNIQUE: Multiplanar and multiecho pulse sequences of the cervical spine, to include the craniocervical junction and cervicothoracic junction, and thoracic and lumbar spine, were obtained without intravenous contrast. COMPARISON:  None Available. Cervical spine CT and chest CT 09/08/2019 FINDINGS: MRI CERVICAL SPINE FINDINGS Alignment: There is slight reversal of the normal cervical curvature centered at C6 with trace grade 1 retrolisthesis of C6 on C7. There is no evidence of traumatic malalignment. Vertebrae: Vertebral body heights are preserved. Background marrow signal is normal. There is benign intraosseous hemangioma in the C5 vertebral body. There is no suspicious marrow signal abnormality or marrow edema. Cord: Normal in signal and morphology. Posterior Fossa, vertebral arteries, paraspinal tissues: The imaged posterior fossa is unremarkable. The vertebral artery flow voids are normal. 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 The paraspinal soft tissues are otherwise unremarkable. Disc levels: C2-C3: No significant spinal canal or neural foraminal stenosis. C3-C4: There is mild uncovertebral ridging without significant spinal canal or neural foraminal stenosis C4-C5: No significant spinal canal or neural foraminal stenosis C5-C6: There is mild disc  desiccation and narrowing with prominent right-sided uncovertebral ridging resulting moderate to severe right and no significant left neural foraminal stenosis and no significant spinal canal stenosis. C6-C7: There is mild disc desiccation and narrowing with left worse than right uncovertebral ridging resulting in moderate to severe left and moderate right neural foraminal stenosis without significant spinal canal stenosis C7-T1: No significant spinal canal or neural foraminal stenosis. MRI THORACIC SPINE FINDINGS Alignment:  Normal. Vertebrae: Vertebral body heights are preserved. Background marrow signal is normal. There is no suspicious marrow signal abnormality or marrow edema. Cord:  Normal in signal and morphology. Paraspinal and other soft tissues: Unremarkable. Disc levels: The disc heights are preserved. There is no significant disc herniation. There is no significant spinal canal or neural foraminal stenosis. MRI LUMBAR SPINE FINDINGS Segmentation: Standard; the lowest formed disc space is designated L5-S1. Alignment:  Normal. Vertebrae: Vertebral body heights are preserved. Background marrow signal is normal. There is no suspicious marrow signal abnormality or marrow edema. There is a benign intraosseous hemangioma in the L5 vertebral body. Conus medullaris and cauda equina: Conus extends to the L1 level. Conus and cauda equina appear normal. Paraspinal and other soft tissues: Unremarkable. Disc levels: T12-L1: No significant spinal canal or neural foraminal stenosis L1-L2: No significant spinal canal or neural foraminal stenosis L2-L3: No significant spinal canal or neural foraminal stenosis. L3-L4: There is a left subarticular zone/foraminal protrusion and mild facet arthropathy with small effusions resulting in mild left and no significant right neural foraminal stenosis and no significant spinal canal stenosis. L4-L5: There is a small right foraminal protrusion and moderate right and mild left facet  arthropathy  resulting in mild right and no significant left neural foraminal stenosis and no significant spinal canal stenosis L5-S1: Mild facet arthropathy without significant spinal canal or neural foraminal stenosis. IMPRESSION: 1. 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 2. Unremarkable MRI of the thoracic spine with no significant degenerative change, and no significant spinal canal or neural foraminal stenosis. 3. Left subarticular zone/foraminal protrusion and mild facet arthropathy at L3-L4 resulting in mild left neural foraminal stenosis. 4. Small right foraminal protrusion and moderate right and mild left facet arthropathy at L4-L5 resulting in mild right neural foraminal stenosis. 5. No significant spinal canal stenosis at any level in the spine. Electronically Signed   By: Lesia Hausen M.D.   On: 08/27/2022 19:59   MR THORACIC SPINE WO CONTRAST  Result Date: 08/27/2022 CLINICAL DATA:  Weakness EXAM: MRI CERVICAL, THORACIC AND LUMBAR SPINE WITHOUT CONTRAST TECHNIQUE: Multiplanar and multiecho pulse sequences of the cervical spine, to include the craniocervical junction and cervicothoracic junction, and thoracic and lumbar spine, were obtained without intravenous contrast. COMPARISON:  None Available. Cervical spine CT and chest CT 09/08/2019 FINDINGS: MRI CERVICAL SPINE FINDINGS Alignment: There is slight reversal of the normal cervical curvature centered at C6 with trace grade 1 retrolisthesis of C6 on C7. There is no evidence of traumatic malalignment. Vertebrae: Vertebral body heights are preserved. Background marrow signal is normal. There is benign intraosseous hemangioma in the C5 vertebral body. There is no suspicious marrow signal abnormality or marrow edema. Cord: Normal in signal and morphology. Posterior Fossa, vertebral arteries, paraspinal tissues:  The imaged posterior fossa is unremarkable. The vertebral artery flow voids are normal. 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 The paraspinal soft tissues are otherwise unremarkable. Disc levels: C2-C3: No significant spinal canal or neural foraminal stenosis. C3-C4: There is mild uncovertebral ridging without significant spinal canal or neural foraminal stenosis C4-C5: No significant spinal canal or neural foraminal stenosis C5-C6: There is mild disc desiccation and narrowing with prominent right-sided uncovertebral ridging resulting moderate to severe right and no significant left neural foraminal stenosis and no significant spinal canal stenosis. C6-C7: There is mild disc desiccation and narrowing with left worse than right uncovertebral ridging resulting in moderate to severe left and moderate right neural foraminal stenosis without significant spinal canal stenosis C7-T1: No significant spinal canal or neural foraminal stenosis. MRI THORACIC SPINE FINDINGS Alignment:  Normal. Vertebrae: Vertebral body heights are preserved. Background marrow signal is normal. There is no suspicious marrow signal abnormality or marrow edema. Cord:  Normal in signal and morphology. Paraspinal and other soft tissues: Unremarkable. Disc levels: The disc heights are preserved. There is no significant disc herniation. There is no significant spinal canal or neural foraminal stenosis. MRI LUMBAR SPINE FINDINGS Segmentation: Standard; the lowest formed disc space is designated L5-S1. Alignment:  Normal. Vertebrae: Vertebral body heights are preserved. Background marrow signal is normal. There is no suspicious marrow signal abnormality or marrow edema. There is a benign intraosseous hemangioma in the L5 vertebral body. Conus medullaris and cauda equina: Conus extends to the L1 level. Conus and cauda equina appear normal. Paraspinal and other soft  tissues: Unremarkable. Disc levels: T12-L1: No significant spinal canal or neural foraminal stenosis L1-L2: No significant spinal canal or neural foraminal stenosis L2-L3: No significant spinal canal or neural foraminal stenosis. L3-L4: There  is a left subarticular zone/foraminal protrusion and mild facet arthropathy with small effusions resulting in mild left and no significant right neural foraminal stenosis and no significant spinal canal stenosis. L4-L5: There is a small right foraminal protrusion and moderate right and mild left facet arthropathy resulting in mild right and no significant left neural foraminal stenosis and no significant spinal canal stenosis L5-S1: Mild facet arthropathy without significant spinal canal or neural foraminal stenosis. IMPRESSION: 1. 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 2. Unremarkable MRI of the thoracic spine with no significant degenerative change, and no significant spinal canal or neural foraminal stenosis. 3. Left subarticular zone/foraminal protrusion and mild facet arthropathy at L3-L4 resulting in mild left neural foraminal stenosis. 4. Small right foraminal protrusion and moderate right and mild left facet arthropathy at L4-L5 resulting in mild right neural foraminal stenosis. 5. No significant spinal canal stenosis at any level in the spine. Electronically Signed   By: Lesia Hausen M.D.   On: 08/27/2022 19:59   MR LUMBAR SPINE WO CONTRAST  Result Date: 08/27/2022 CLINICAL DATA:  Weakness EXAM: MRI CERVICAL, THORACIC AND LUMBAR SPINE WITHOUT CONTRAST TECHNIQUE: Multiplanar and multiecho pulse sequences of the cervical spine, to include the craniocervical junction and cervicothoracic junction, and thoracic and lumbar spine, were obtained without intravenous contrast. COMPARISON:  None Available. Cervical spine CT and chest CT  09/08/2019 FINDINGS: MRI CERVICAL SPINE FINDINGS Alignment: There is slight reversal of the normal cervical curvature centered at C6 with trace grade 1 retrolisthesis of C6 on C7. There is no evidence of traumatic malalignment. Vertebrae: Vertebral body heights are preserved. Background marrow signal is normal. There is benign intraosseous hemangioma in the C5 vertebral body. There is no suspicious marrow signal abnormality or marrow edema. Cord: Normal in signal and morphology. Posterior Fossa, vertebral arteries, paraspinal tissues: The imaged posterior fossa is unremarkable. The vertebral artery flow voids are normal. 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 The paraspinal soft tissues are otherwise unremarkable. Disc levels: C2-C3: No significant spinal canal or neural foraminal stenosis. C3-C4: There is mild uncovertebral ridging without significant spinal canal or neural foraminal stenosis C4-C5: No significant spinal canal or neural foraminal stenosis C5-C6: There is mild disc desiccation and narrowing with prominent right-sided uncovertebral ridging resulting moderate to severe right and no significant left neural foraminal stenosis and no significant spinal canal stenosis. C6-C7: There is mild disc desiccation and narrowing with left worse than right uncovertebral ridging resulting in moderate to severe left and moderate right neural foraminal stenosis without significant spinal canal stenosis C7-T1: No significant spinal canal or neural foraminal stenosis. MRI THORACIC SPINE FINDINGS Alignment:  Normal. Vertebrae: Vertebral body heights are preserved. Background marrow signal is normal. There is no suspicious marrow signal abnormality or marrow edema. Cord:  Normal in signal and morphology. Paraspinal and other soft tissues: Unremarkable. Disc levels: The disc heights are preserved. There is no significant disc  herniation. There is no significant spinal canal or neural foraminal stenosis. MRI LUMBAR SPINE FINDINGS Segmentation: Standard; the lowest formed disc space is designated L5-S1. Alignment:  Normal. Vertebrae: Vertebral body heights are preserved. Background marrow signal is normal. There is no suspicious marrow signal abnormality or marrow edema. There is a benign intraosseous hemangioma in the L5 vertebral body. Conus medullaris and cauda equina: Conus extends to  the L1 level. Conus and cauda equina appear normal. Paraspinal and other soft tissues: Unremarkable. Disc levels: T12-L1: No significant spinal canal or neural foraminal stenosis L1-L2: No significant spinal canal or neural foraminal stenosis L2-L3: No significant spinal canal or neural foraminal stenosis. L3-L4: There is a left subarticular zone/foraminal protrusion and mild facet arthropathy with small effusions resulting in mild left and no significant right neural foraminal stenosis and no significant spinal canal stenosis. L4-L5: There is a small right foraminal protrusion and moderate right and mild left facet arthropathy resulting in mild right and no significant left neural foraminal stenosis and no significant spinal canal stenosis L5-S1: Mild facet arthropathy without significant spinal canal or neural foraminal stenosis. IMPRESSION: 1. 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 2. Unremarkable MRI of the thoracic spine with no significant degenerative change, and no significant spinal canal or neural foraminal stenosis. 3. Left subarticular zone/foraminal protrusion and mild facet arthropathy at L3-L4 resulting in mild left neural foraminal stenosis. 4. Small right foraminal protrusion and moderate right and mild left facet arthropathy at L4-L5 resulting in mild right neural foraminal stenosis. 5. No  significant spinal canal stenosis at any level in the spine. Electronically Signed   By: Lesia Hausen M.D.   On: 08/27/2022 19:59    Pending Labs Unresulted Labs (From admission, onward)     Start     Ordered   08/27/22 2249  HIV Antibody (routine testing w rflx)  (HIV Antibody (Routine testing w reflex) panel)  Once,   R        08/27/22 2249   08/27/22 2138  CSF cell count with differential collection tube #: 1  (CSF Labs)  ONCE - STAT,   STAT       Question Answer Comment  collection tube # 1   Are there also cytology or pathology orders on this specimen? Yes      08/27/22 2137   08/27/22 2138  CSF cell count with differential  (CSF Labs)  ONCE - STAT,   STAT        08/27/22 2137   08/27/22 2138  Protein and glucose, CSF  ([facility] Labs)  ONCE - STAT,   STAT        08/27/22 2137   08/27/22 2137  CSF culture w Gram Stain  ([facility] Labs)  Once,   URGENT       Question:  Are there also cytology or pathology orders on this specimen?  Answer:  Yes   08/27/22 2137            Vitals/Pain Today's Vitals   08/27/22 2145 08/27/22 2309 08/27/22 2312 08/27/22 2313  BP:      Pulse: 93 88 83 84  Resp: 19 18 20 13   Temp:      TempSrc:      SpO2: 96% 98% 99% 99%  Weight:      Height:      PainSc:        Isolation Precautions No active isolations  Medications Medications  acetaminophen (TYLENOL) tablet 650 mg (has no administration in time range)    Or  acetaminophen (TYLENOL) suppository 650 mg (has no administration in time range)  oxyCODONE (Oxy IR/ROXICODONE) immediate release tablet 5 mg (has no administration in time range)  fentaNYL (SUBLIMAZE) injection 12.5-50 mcg (has no administration in time range)  morphine (PF) 4 MG/ML injection  4 mg (4 mg Intravenous Given 08/27/22 1648)  fentaNYL (SUBLIMAZE) injection 50 mcg (50 mcg Intravenous Given 08/27/22 1739)  morphine (PF) 4 MG/ML injection 6 mg (6 mg Intravenous Given 08/27/22 1825)  ketamine 50 mg in normal saline 5 mL (10 mg/mL)  syringe (19 mg Intravenous Given 08/27/22 2033)  morphine (PF) 4 MG/ML injection 4 mg (4 mg Intravenous Given 08/27/22 2243)    Mobility walks     Focused Assessments Admission for IR LP   R Recommendations: See Admitting Provider Note  Report given to:   Additional Notes: Call or epic message for any additional questions

## 2022-08-27 NOTE — ED Provider Notes (Signed)
HPI: Kristopher [name] is a 52 year old male with a past medical history as below presenting today with neck and back pain.  He reports he feels neck pain and it radiates towards his legs.  He has not had associated weakness and has had difficulty ambulating with this symptoms.  He denies fevers or chills.  He denies a history of IV drug use.  He endorses that he is a [employer].  He reports his symptoms started 2 days ago and he has never had the symptoms like this before.  He endorses feeling paresthesias down the bilateral upper extremities.  He denies any known tick bites or wilderness exposures.  He denies any rashes.  He denies any headache.  Due to worsening of his symptoms and worsening of his pain he presented to the emergency department.  He denies blood thinning medications.  He denies difficulty urinating or urinary incontinence.  He denies fecal incontinence.   Past Medical History:  Diagnosis Date   Alcohol dependence (HCC)    COPD (chronic obstructive pulmonary disease) (HCC)    Diabetes mellitus without complication (HCC)    GERD (gastroesophageal reflux disease)    Hypertension    Pancreatitis    Pancreatitis     Past Surgical History:  Procedure Laterality Date   ABDOMINAL SURGERY     ESOPHAGOGASTRODUODENOSCOPY N/A 02/12/2020   Procedure: ESOPHAGOGASTRODUODENOSCOPY (EGD);  Surgeon: Toledo, Boykin Nearing, MD;  Location: [facility] ENDOSCOPY;  Service: Gastroenterology;  Laterality: N/A;     Social History   Tobacco Use   Smoking status: Every Day    Packs/day: 1    Types: Cigarettes   Smokeless tobacco: Never  Vaping Use   Vaping Use: Never used  Substance Use Topics   Alcohol use: Yes    Alcohol/week: 18.0 standard drinks of alcohol    Types: 9 Cans of beer, 9 Shots of liquor per week    Comment: 5 hard lemonades a day   Drug use: Not Currently    Types: Marijuana, Cocaine      Review of Systems  A complete ROS was performed with pertinent positives/negatives noted  in the HPI.   Vitals:   08/27/22 1736 08/27/22 1743  BP: (!) 152/88   Pulse: (!) 107   Resp: (!) 21   Temp:  98.1 F (36.7 C)  SpO2: 100%     Physical Exam Vitals and nursing note reviewed.  Constitutional:      General: He is not in acute distress.    Appearance: He is well-developed.  HENT:     Head: Normocephalic and atraumatic.  Eyes:     Conjunctiva/sclera: Conjunctivae normal.  Cardiovascular:     Rate and Rhythm: Normal rate and regular rhythm.     Pulses: Normal pulses.     Heart sounds: Normal heart sounds. No murmur heard.    No friction rub. No gallop.  Pulmonary:     Effort: Pulmonary effort is normal. No respiratory distress.     Breath sounds: Normal breath sounds. No stridor. No wheezing, rhonchi or rales.  Chest:     Chest wall: No tenderness.  Abdominal:     General: Abdomen is flat. There is no distension.     Palpations: Abdomen is soft.     Tenderness: There is no abdominal tenderness. There is no guarding or rebound.  Musculoskeletal:        General: No swelling.  Skin:    General: Skin is warm and dry.     Capillary Refill:  Capillary refill takes less than 2 seconds.  Neurological:     Mental Status: He is alert.     Sensory: Sensory deficit present.     Motor: Weakness present. No tremor, atrophy or abnormal muscle tone.     Comments: Patient endorses decreased sensation primarily in the lower extremities including reduced saddle sensation.  He has 3 out of 5 strength in the upper extremities-year-old male and 3 out of 5 strength in the lower extremities.  Psychiatric:        Mood and Affect: Mood normal.     .Lumbar Puncture  Date/Time: 08/28/2022 10:58 AM  Performed by: Chase Caller, MD Authorized by: Derwood Kaplan, MD   Consent:    Consent obtained:  Written   Consent given by:  Patient   Risks, benefits, and alternatives were discussed: yes     Risks discussed:  Headache, bleeding, infection, nerve damage, repeat procedure  and pain   Alternatives discussed:  No treatment Universal protocol:    Procedure explained and questions answered to patient or proxy's satisfaction: yes     Relevant documents present and verified: yes     Site/side marked: yes     Patient identity confirmed:  Arm band and verbally with patient Pre-procedure details:    Procedure purpose:  Diagnostic   Preparation: Patient was prepped and draped in usual sterile fashion   Anesthesia:    Anesthesia method:  Local infiltration   Local anesthetic:  Lidocaine 1% w/o epi Procedure details:    Lumbar space:  L4-L5 interspace   Patient position:  L lateral decubitus   Needle gauge:  22   Needle type:  Spinal needle - Quincke tip   Needle length (in):  3.5   Ultrasound guidance: no     Number of attempts:  4 Post-procedure details:    Procedure completion:  Tolerated well, no immediate complications Comments:     Unable to obtain CSF sample   MDM:  Imaging/radiology results:  MR Cervical Spine Wo Contrast    (Results Pending)  MR THORACIC SPINE WO CONTRAST    (Results Pending)  MR LUMBAR SPINE WO CONTRAST    (Results Pending)    Lab results:  Results for orders placed or performed during the hospital encounter of 08/27/22 (from the past 24 hour(s))  CBC with Differential   Collection Time: 08/27/22  4:33 PM  Result Value Ref Range   WBC 7.6 4.0 - 10.5 K/uL   RBC 5.45 4.22 - 5.81 MIL/uL   Hemoglobin 15.6 13.0 - 17.0 g/dL   HCT 16.1 09.6 - 04.5 %   MCV 84.6 80.0 - 100.0 fL   MCH 28.6 26.0 - 34.0 pg   MCHC 33.8 30.0 - 36.0 g/dL   RDW 40.9 81.1 - 91.4 %   Platelets 221 150 - 400 K/uL   nRBC 0.0 0.0 - 0.2 %   Neutrophils Relative % 60 %   Neutro Abs 4.6 1.7 - 7.7 K/uL   Lymphocytes Relative 32 %   Lymphs Abs 2.5 0.7 - 4.0 K/uL   Monocytes Relative 5 %   Monocytes Absolute 0.4 0.1 - 1.0 K/uL   Eosinophils Relative 2 %   Eosinophils Absolute 0.1 0.0 - 0.5 K/uL   Basophils Relative 1 %   Basophils Absolute 0.1 0.0 - 0.1  K/uL   Immature Granulocytes 0 %   Abs Immature Granulocytes 0.02 0.00 - 0.07 K/uL  Comprehensive metabolic panel   Collection Time: 08/27/22  4:33 PM  Result  Value Ref Range   Sodium 133 (L) 135 - 145 mmol/L   Potassium 3.9 3.5 - 5.1 mmol/L   Chloride 100 98 - 111 mmol/L   CO2 24 22 - 32 mmol/L   Glucose, Bld 142 (H) 70 - 99 mg/dL   BUN 8 6 - 20 mg/dL   Creatinine, Ser 1.61 0.61 - 1.24 mg/dL   Calcium 9.0 8.9 - 09.6 mg/dL   Total Protein 7.4 6.5 - 8.1 g/dL   Albumin 3.9 3.5 - 5.0 g/dL   AST 26 15 - 41 U/L   ALT 28 0 - 44 U/L   Alkaline Phosphatase 48 38 - 126 U/L   Total Bilirubin 0.7 0.3 - 1.2 mg/dL   GFR, Estimated >04 >54 mL/min   Anion gap 9 5 - 15  Urinalysis, w/ Reflex to Culture (Infection Suspected) -Urine, Clean Catch   Collection Time: 08/27/22  6:05 PM  Result Value Ref Range   Specimen Source URINE, CLEAN CATCH    Color, Urine YELLOW YELLOW   APPearance CLEAR CLEAR   Specific Gravity, Urine 1.011 1.005 - 1.030   pH 5.0 5.0 - 8.0   Glucose, UA NEGATIVE NEGATIVE mg/dL   Hgb urine dipstick NEGATIVE NEGATIVE   Bilirubin Urine NEGATIVE NEGATIVE   Ketones, ur NEGATIVE NEGATIVE mg/dL   Protein, ur NEGATIVE NEGATIVE mg/dL   Nitrite NEGATIVE NEGATIVE   Leukocytes,Ua NEGATIVE NEGATIVE   RBC / HPF 0-5 0 - 5 RBC/hpf   WBC, UA 0-5 0 - 5 WBC/hpf   Bacteria, UA NONE SEEN NONE SEEN   Squamous Epithelial / HPF 0-5 0 - 5 /HPF   Mucus PRESENT   Rapid urine drug screen (hospital performed)   Collection Time: 08/27/22  6:05 PM  Result Value Ref Range   Opiates POSITIVE (A) NONE DETECTED   Cocaine NONE DETECTED NONE DETECTED   Benzodiazepines NONE DETECTED NONE DETECTED   Amphetamines NONE DETECTED NONE DETECTED   Tetrahydrocannabinol NONE DETECTED NONE DETECTED   Barbiturates NONE DETECTED NONE DETECTED    Key medications administered in the ER:  Medications  morphine (PF) 4 MG/ML injection 4 mg (4 mg Intravenous Given 08/27/22 1648)  fentaNYL (SUBLIMAZE) injection 50  mcg (50 mcg Intravenous Given 08/27/22 1739)  morphine (PF) 4 MG/ML injection 6 mg (6 mg Intravenous Given 08/27/22 1825)   Medical decision making: -Vital signs stable. Patient afebrile, hemodynamically stable, and non-toxic appearing. -Patient's presentation is most consistent with acute presentation with potential threat to life or bodily function. -Kristopher [name] is a 52 y.o. male presenting to the emergency department with back pain.  The patient does not have any recent trauma.  He does have some midline tenderness but is generally tender all over the paraspinal and spinal area.  This is true of the see-through L-spine.  He has no step-offs or deformities down the spine.  He denies IV drug use and is afebrile without leukocytosis lower concern for epidural abscess.  He is endorsing saddle anesthesia and numbness down his legs.  Postvoid residual is 0 cc.  Given his acute onset weakness with severe pain with neurologic symptoms we will proceed with MRI of the spine as I am concerned for spinal cord compression.  MRI does not show evidence of acute spinal cord compression to explain the patient's symptoms.  Labs have been obtained and on my interpretation, CBC without hematologic abnormality.  CMP does not show evidence of metabolic derangement.  Magnesium is normal.  UA unremarkable.  UDS positive for opioids  which he has been given in the emergency department.  Upon reassessment, patient with severe pain given multiple rounds of IV opioids.  Patient with persistent pain therefore given dose of IV pain dose ketamine which did provide some relief. -Due to unclear nature of his symptoms I have discussed the case with neurology who recommended LP.  Their concern for viral meningitis versus possible musculoskeletal pain.  I have attempted to form LP however unfortunately was unable to obtain fluid after multiple attempts.  Patient will require admission for LP.  I have discussed the patient with medicine and  they will admit for intractable pain and LP with IR.  Patient admitted.       The plan for this patient was discussed with Dr. Rhunette Croft, who voiced agreement and who oversaw evaluation and treatment of this patient.  Marta Lamas, MD Emergency Medicine, PGY-3  Note: Dragon medical dictation software was used in the creation of this note.   Clinical Impression:  1. Neck pain   2. Weakness     Admit   Chase Caller, MD 08/28/22 1101    Derwood Kaplan, MD 08/30/22 1450

## 2022-08-27 NOTE — ED Triage Notes (Signed)
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

## 2022-08-27 NOTE — ED Notes (Signed)
Pt connected to bedside cardiac monitor and pulse ox.

## 2022-08-28 ENCOUNTER — Inpatient Hospital Stay (HOSPITAL_COMMUNITY): Payer: 59

## 2022-08-28 DIAGNOSIS — M4722 Other spondylosis with radiculopathy, cervical region: Secondary | ICD-10-CM | POA: Diagnosis present

## 2022-08-28 DIAGNOSIS — K219 Gastro-esophageal reflux disease without esophagitis: Secondary | ICD-10-CM | POA: Diagnosis present

## 2022-08-28 DIAGNOSIS — E669 Obesity, unspecified: Secondary | ICD-10-CM | POA: Diagnosis present

## 2022-08-28 DIAGNOSIS — M5126 Other intervertebral disc displacement, lumbar region: Secondary | ICD-10-CM | POA: Diagnosis present

## 2022-08-28 DIAGNOSIS — Z79899 Other long term (current) drug therapy: Secondary | ICD-10-CM | POA: Diagnosis not present

## 2022-08-28 DIAGNOSIS — Z833 Family history of diabetes mellitus: Secondary | ICD-10-CM | POA: Diagnosis not present

## 2022-08-28 DIAGNOSIS — Z91041 Radiographic dye allergy status: Secondary | ICD-10-CM | POA: Diagnosis not present

## 2022-08-28 DIAGNOSIS — E871 Hypo-osmolality and hyponatremia: Secondary | ICD-10-CM

## 2022-08-28 DIAGNOSIS — M4802 Spinal stenosis, cervical region: Secondary | ICD-10-CM | POA: Diagnosis present

## 2022-08-28 DIAGNOSIS — R531 Weakness: Secondary | ICD-10-CM | POA: Diagnosis not present

## 2022-08-28 DIAGNOSIS — I10 Essential (primary) hypertension: Secondary | ICD-10-CM | POA: Diagnosis present

## 2022-08-28 DIAGNOSIS — R52 Pain, unspecified: Secondary | ICD-10-CM | POA: Diagnosis not present

## 2022-08-28 DIAGNOSIS — M542 Cervicalgia: Secondary | ICD-10-CM | POA: Diagnosis present

## 2022-08-28 DIAGNOSIS — Z6836 Body mass index (BMI) 36.0-36.9, adult: Secondary | ICD-10-CM | POA: Diagnosis not present

## 2022-08-28 DIAGNOSIS — F17210 Nicotine dependence, cigarettes, uncomplicated: Secondary | ICD-10-CM | POA: Diagnosis present

## 2022-08-28 DIAGNOSIS — Z91128 Patient's intentional underdosing of medication regimen for other reason: Secondary | ICD-10-CM | POA: Diagnosis not present

## 2022-08-28 DIAGNOSIS — E119 Type 2 diabetes mellitus without complications: Secondary | ICD-10-CM | POA: Diagnosis present

## 2022-08-28 DIAGNOSIS — M47816 Spondylosis without myelopathy or radiculopathy, lumbar region: Secondary | ICD-10-CM | POA: Diagnosis present

## 2022-08-28 DIAGNOSIS — J449 Chronic obstructive pulmonary disease, unspecified: Secondary | ICD-10-CM | POA: Diagnosis present

## 2022-08-28 LAB — CBC WITH DIFFERENTIAL/PLATELET
Abs Immature Granulocytes: 0.03 10*3/uL (ref 0.00–0.07)
Basophils Absolute: 0.1 10*3/uL (ref 0.0–0.1)
Basophils Relative: 1 %
Eosinophils Absolute: 0.1 10*3/uL (ref 0.0–0.5)
Eosinophils Relative: 1 %
HCT: 44.5 % (ref 39.0–52.0)
Hemoglobin: 14.6 g/dL (ref 13.0–17.0)
Immature Granulocytes: 0 %
Lymphocytes Relative: 26 %
Lymphs Abs: 2.3 10*3/uL (ref 0.7–4.0)
MCH: 27.7 pg (ref 26.0–34.0)
MCHC: 32.8 g/dL (ref 30.0–36.0)
MCV: 84.4 fL (ref 80.0–100.0)
Monocytes Absolute: 0.5 10*3/uL (ref 0.1–1.0)
Monocytes Relative: 6 %
Neutro Abs: 6 10*3/uL (ref 1.7–7.7)
Neutrophils Relative %: 66 %
Platelets: 242 10*3/uL (ref 150–400)
RBC: 5.27 MIL/uL (ref 4.22–5.81)
RDW: 14 % (ref 11.5–15.5)
WBC: 9 10*3/uL (ref 4.0–10.5)
nRBC: 0 % (ref 0.0–0.2)

## 2022-08-28 LAB — COMPREHENSIVE METABOLIC PANEL
ALT: 34 U/L (ref 0–44)
AST: 31 U/L (ref 15–41)
Albumin: 3.8 g/dL (ref 3.5–5.0)
Alkaline Phosphatase: 52 U/L (ref 38–126)
Anion gap: 8 (ref 5–15)
BUN: 14 mg/dL (ref 6–20)
CO2: 26 mmol/L (ref 22–32)
Calcium: 9 mg/dL (ref 8.9–10.3)
Chloride: 99 mmol/L (ref 98–111)
Creatinine, Ser: 0.85 mg/dL (ref 0.61–1.24)
GFR, Estimated: >60 mL/min (ref >60)
Glucose, Bld: 107 mg/dL — ABNORMAL HIGH (ref 70–99)
Potassium: 3.8 mmol/L (ref 3.5–5.1)
Sodium: 133 mmol/L — ABNORMAL LOW (ref 135–145)
Total Bilirubin: 0.6 mg/dL (ref 0.3–1.2)
Total Protein: 6.7 g/dL (ref 6.5–8.1)

## 2022-08-28 LAB — MAGNESIUM: Magnesium: 2 mg/dL (ref 1.7–2.4)

## 2022-08-28 LAB — PHOSPHORUS: Phosphorus: 4.3 mg/dL (ref 2.5–4.6)

## 2022-08-28 MED ORDER — AMLODIPINE BESYLATE 5 MG PO TABS
5.0000 mg | ORAL_TABLET | Freq: Every day | ORAL | Status: DC
  Administered 2022-08-28 – 2022-08-29 (×2): 5 mg via ORAL
  Filled 2022-08-28 (×2): qty 1

## 2022-08-28 MED ORDER — GABAPENTIN 300 MG PO CAPS
300.0000 mg | ORAL_CAPSULE | Freq: Three times a day (TID) | ORAL | Status: DC
  Administered 2022-08-28 – 2022-08-31 (×10): 300 mg via ORAL
  Filled 2022-08-28 (×10): qty 1

## 2022-08-28 MED ORDER — METHOCARBAMOL 750 MG PO TABS
750.0000 mg | ORAL_TABLET | Freq: Three times a day (TID) | ORAL | Status: DC
  Administered 2022-08-28 – 2022-08-31 (×10): 750 mg via ORAL
  Filled 2022-08-28 (×10): qty 1

## 2022-08-28 MED ORDER — ALBUTEROL SULFATE (2.5 MG/3ML) 0.083% IN NEBU: 3.0000 mL | INHALATION_SOLUTION | Freq: Four times a day (QID) | RESPIRATORY_TRACT | Status: DC | PRN

## 2022-08-28 MED ORDER — OXYCODONE HCL 5 MG PO TABS
5.0000 mg | ORAL_TABLET | ORAL | Status: DC | PRN
  Administered 2022-08-28 – 2022-08-29 (×7): 10 mg via ORAL
  Filled 2022-08-28 (×7): qty 2

## 2022-08-28 NOTE — Progress Notes (Signed)
Patient arrived to the room and walked to the bed with assist. Patient have his eye glasses in place and personal clothing. Patient is alert and oriented, reported neck pain 9/10; PRN med was administered.  VS stable, O2 sat at 97% on RA.  Patient was oriented to staffs, call bell and the room. Call bell within reach. Patient refused bed alarm and was educated on the indications for fall risk, patient reported understanding of education.  Kadence Mimbs

## 2022-08-28 NOTE — H&P (Signed)
History and Physical    Kristopher [name] ZOX:[id number] DOB: 1970/11/26 DOA: 08/27/2022  PCP: Patient, No Pcp Per   Chief Complaint: leg pain  HPI: Kristopher [name] is a 52 y.o. male with medical history significant of alcohol usage, COPD, diabetes, hypertension, pancreatitis who presents emergency department due to leg and back pain.  Patient states that he has been having radiating back pain towards his legs as well as weakness and difficulty walking.  He states the pain starts in his neck radiates down his back into his legs and is equal bilaterally.  He has developed difficulty walking and severe pain prompting presentation to the emergency department.  On arrival he was hypertensive with systolics over 150 and tachycardic with heart rate over 100.  Physical exam was notable for bilateral lower extremity sensory and strength deficits.  Labs were obtained which revealed WBC 7.6, hemoglobin 15.6, platelets 221, sodium 133.  Lumbar puncture was attempted in emergency department and was unsuccessful.  MRI spine was obtained which did not traded severe cervical canal stenosis.  Patient was admitted for intractable back pain and further evaluation of his cervical radiculopathy.  On evaluation patient is in pain with the slightest of movement of his neck and legs.  He also has decreased sensation.  He denies any saddle anesthesia.  He does not have any infectious symptoms.  His pain was relieved effectively with IV opioids in the emergency department.   Review of Systems: Review of Systems  All other systems reviewed and are negative.    As per HPI otherwise 10 point review of systems negative.   Allergies  Allergen Reactions   Ivp Dye [Iodinated Contrast Media] Anaphylaxis    Past Medical History:  Diagnosis Date   Alcohol dependence (HCC)    COPD (chronic obstructive pulmonary disease) (HCC)    Diabetes mellitus without complication (HCC)    GERD (gastroesophageal reflux disease)     Hypertension    Pancreatitis    Pancreatitis     Past Surgical History:  Procedure Laterality Date   ABDOMINAL SURGERY     ESOPHAGOGASTRODUODENOSCOPY N/A 02/12/2020   Procedure: ESOPHAGOGASTRODUODENOSCOPY (EGD);  Surgeon: Toledo, Boykin Nearing, MD;  Location: [facility] ENDOSCOPY;  Service: Gastroenterology;  Laterality: N/A;     reports that he has been smoking cigarettes. He has been smoking an average of 1 pack per day. He has never used smokeless tobacco. He reports current alcohol use of about 18.0 standard drinks of alcohol per week. He reports that he does not currently use drugs after having used the following drugs: Marijuana and Cocaine.  Family History  Problem Relation Age of Onset   Diabetes Other     Prior to Admission medications   Medication Sig Start Date End Date Taking? Authorizing Provider  albuterol (VENTOLIN HFA) 108 (90 Base) MCG/ACT inhaler Inhale 1-2 puffs into the lungs every 6 (six) hours as needed for wheezing or shortness of breath. 06/25/22   Rising, Lurena Joiner, PA-C  benzonatate (TESSALON) 100 MG capsule Take 1 capsule (100 mg total) by mouth 3 (three) times daily as needed for cough. 06/25/22   Rising, Lurena Joiner, PA-C  Multiple Vitamin (MULTIVITAMIN WITH MINERALS) TABS tablet Take 1 tablet by mouth daily. 02/13/20   Arnetha Courser, MD    Physical Exam: Vitals:   08/27/22 2312 08/27/22 2313 08/27/22 2330 08/27/22 2345  BP:      Pulse: 83 84 93 84  Resp: 20 13 17 10   Temp:      TempSrc:  SpO2: 99% 99% 100% 95%  Weight:      Height:       Physical Exam Constitutional:      Appearance: He is obese.  HENT:     Head: Normocephalic.     Nose: Nose normal.     Mouth/Throat:     Mouth: Mucous membranes are moist.     Pharynx: Oropharynx is clear.  Cardiovascular:     Rate and Rhythm: Normal rate and regular rhythm.     Pulses: Normal pulses.     Heart sounds: Normal heart sounds.  Pulmonary:     Effort: Pulmonary effort is normal.     Breath sounds: Normal  breath sounds.  Abdominal:     General: Abdomen is flat. Bowel sounds are normal.  Musculoskeletal:     Cervical back: Normal range of motion.  Skin:    General: Skin is warm.  Neurological:     Mental Status: He is alert and oriented to person, place, and time.     Sensory: Sensory deficit present.     Motor: Weakness present.  Psychiatric:        Mood and Affect: Mood normal.        Labs on Admission: I have personally reviewed the patients's labs and imaging studies.  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   Hypertension-Patient is not on home antihypertensives.  Plan to start amlodipine.   Admission status: Observation Med-Surg  Certification: The appropriate patient status for this patient is OBSERVATION. Observation status is judged to be reasonable and necessary in order to provide the required intensity of service to ensure the patient's safety. The patient's presenting symptoms, physical exam findings, and initial radiographic and laboratory data in the context of their medical condition is felt to place them at decreased risk for further clinical deterioration. Furthermore, it is anticipated that the patient will be medically stable for discharge from the hospital within 2 midnights of admission.     Alan Mulder MD [facility] If 7PM-7AM, please contact night-coverage [email or web address]  08/28/2022, 12:15 AM

## 2022-08-28 NOTE — Consult Note (Signed)
Reason for Consult: Back pain neck pain and weakness Referring Physician: Dr. Marguerita Merles  Kristopher [name] is an 52 y.o. male.  HPI: The patient is a 52 year old individual who tells me that last year he was admitted to the hospital elsewhere in [place] because of profound weakness in his legs he notes that he had a CODE BLUE after he was given some diet and obtaining an MRI.  He woke up with his legs being very weak and he was in rehab for period of about 2 weeks during which time he had to relearn how to walk.  He regained his strength but he has had chronic back pain and neck pain since that time.  This is gotten considerably worse to the point where he presented to the emergency department.  MRIs of the cervical thoracic and lumbar spines were performed.  He notes that he is having considerable back pain now and has great difficulty walking.  He is requiring a walker to be able to move his legs which he drags behind him as he places most weight on the front of his walker.  He also complains of significant neck pain.  Review of the MRIs demonstrates that the patient has cervical spondylosis with reversal of the normal lordotic curve at the C5-6 and C6-C7 levels he has some foraminal stenosis at both C5-6 and C6-7 that is more severe on the left side at C5-6 and more severe on the right side at C6-C7.  There is no overt cord compression.  In the lumbar spine the patient has evidence of some modest disc degenerative changes with a foraminal disc protrusion at L3-4 on the left and L4-5 on the right.  There is no nerve root compromise and certainly no spinal canal compromise.  Past Medical History:  Diagnosis Date   Alcohol dependence (HCC)    COPD (chronic obstructive pulmonary disease) (HCC)    Diabetes mellitus without complication (HCC)    GERD (gastroesophageal reflux disease)    Hypertension    Pancreatitis    Pancreatitis     Past Surgical History:  Procedure Laterality Date    ABDOMINAL SURGERY     ESOPHAGOGASTRODUODENOSCOPY N/A 02/12/2020   Procedure: ESOPHAGOGASTRODUODENOSCOPY (EGD);  Surgeon: Toledo, Boykin Nearing, MD;  Location: [facility] ENDOSCOPY;  Service: Gastroenterology;  Laterality: N/A;    Family History  Problem Relation Age of Onset   Diabetes Other     Social History:  reports that he has been smoking cigarettes. He has been smoking an average of 1 pack per day. He has never used smokeless tobacco. He reports current alcohol use of about 18.0 standard drinks of alcohol per week. He reports that he does not currently use drugs after having used the following drugs: Marijuana and Cocaine.  Allergies:  Allergies  Allergen Reactions   Ivp Dye [Iodinated Contrast Media] Anaphylaxis    Medications: I have reviewed the patient's current medications.  Results for orders placed or performed during the hospital encounter of 08/27/22 (from the past 48 hour(s))  CBC with Differential     Status: None   Collection Time: 08/27/22  4:33 PM  Result Value Ref Range   WBC 7.6 4.0 - 10.5 K/uL   RBC 5.45 4.22 - 5.81 MIL/uL   Hemoglobin 15.6 13.0 - 17.0 g/dL   HCT 52.8 41.3 - 24.4 %   MCV 84.6 80.0 - 100.0 fL   MCH 28.6 26.0 - 34.0 pg   MCHC 33.8 30.0 - 36.0 g/dL  RDW 13.9 11.5 - 15.5 %   Platelets 221 150 - 400 K/uL   nRBC 0.0 0.0 - 0.2 %   Neutrophils Relative % 60 %   Neutro Abs 4.6 1.7 - 7.7 K/uL   Lymphocytes Relative 32 %   Lymphs Abs 2.5 0.7 - 4.0 K/uL   Monocytes Relative 5 %   Monocytes Absolute 0.4 0.1 - 1.0 K/uL   Eosinophils Relative 2 %   Eosinophils Absolute 0.1 0.0 - 0.5 K/uL   Basophils Relative 1 %   Basophils Absolute 0.1 0.0 - 0.1 K/uL   Immature Granulocytes 0 %   Abs Immature Granulocytes 0.02 0.00 - 0.07 K/uL    Comment: Performed at [facility], [address] [place], [place] [address]  Comprehensive metabolic panel     Status: Abnormal   Collection Time: 08/27/22  4:33 PM  Result Value Ref Range   Sodium 133 (L) 135 -  145 mmol/L   Potassium 3.9 3.5 - 5.1 mmol/L   Chloride 100 98 - 111 mmol/L   CO2 24 22 - 32 mmol/L   Glucose, Bld 142 (H) 70 - 99 mg/dL    Comment: Glucose reference range applies only to samples taken after fasting for at least 8 hours.   BUN 8 6 - 20 mg/dL   Creatinine, Ser 1.91 0.61 - 1.24 mg/dL   Calcium 9.0 8.9 - 47.8 mg/dL   Total Protein 7.4 6.5 - 8.1 g/dL   Albumin 3.9 3.5 - 5.0 g/dL   AST 26 15 - 41 U/L   ALT 28 0 - 44 U/L   Alkaline Phosphatase 48 38 - 126 U/L   Total Bilirubin 0.7 0.3 - 1.2 mg/dL   GFR, Estimated >29 >56 mL/min    Comment: (NOTE) Calculated using the CKD-EPI Creatinine Equation (2021)    Anion gap 9 5 - 15    Comment: Performed at [facility], [address] [place], [place] [address]  Magnesium     Status: None   Collection Time: 08/27/22  4:33 PM  Result Value Ref Range   Magnesium 1.9 1.7 - 2.4 mg/dL    Comment: Performed at [facility], [address] [place], [place] [address]  Urinalysis, w/ Reflex to Culture (Infection Suspected) -Urine, Clean Catch     Status: None   Collection Time: 08/27/22  6:05 PM  Result Value Ref Range   Specimen Source URINE, CLEAN CATCH    Color, Urine YELLOW YELLOW   APPearance CLEAR CLEAR   Specific Gravity, Urine 1.011 1.005 - 1.030   pH 5.0 5.0 - 8.0   Glucose, UA NEGATIVE NEGATIVE mg/dL   Hgb urine dipstick NEGATIVE NEGATIVE   Bilirubin Urine NEGATIVE NEGATIVE   Ketones, ur NEGATIVE NEGATIVE mg/dL   Protein, ur NEGATIVE NEGATIVE mg/dL   Nitrite NEGATIVE NEGATIVE   Leukocytes,Ua NEGATIVE NEGATIVE   RBC / HPF 0-5 0 - 5 RBC/hpf   WBC, UA 0-5 0 - 5 WBC/hpf    Comment:        Reflex urine culture not performed if WBC <=10, OR if Squamous epithelial cells >5. If Squamous epithelial cells >5 suggest recollection.    Bacteria, UA NONE SEEN NONE SEEN   Squamous Epithelial / HPF 0-5 0 - 5 /HPF   Mucus PRESENT     Comment: Performed at [facility], [address] [place], [place]  [address]  Rapid urine drug screen (hospital performed)     Status: Abnormal  Collection Time: 08/27/22  6:05 PM  Result Value Ref Range   Opiates POSITIVE (A) NONE DETECTED   Cocaine NONE DETECTED NONE DETECTED   Benzodiazepines NONE DETECTED NONE DETECTED   Amphetamines NONE DETECTED NONE DETECTED   Tetrahydrocannabinol NONE DETECTED NONE DETECTED   Barbiturates NONE DETECTED NONE DETECTED    Comment: (NOTE) DRUG SCREEN FOR MEDICAL PURPOSES ONLY.  IF CONFIRMATION IS NEEDED FOR ANY PURPOSE, NOTIFY LAB WITHIN 5 DAYS.  LOWEST DETECTABLE LIMITS FOR URINE DRUG SCREEN Drug Class                     Cutoff (ng/mL) Amphetamine and metabolites    1000 Barbiturate and metabolites    200 Benzodiazepine                 200 Opiates and metabolites        300 Cocaine and metabolites        300 THC                            50 Performed at [facility], [address], [place], [place] [address]   CBC with Differential/Platelet     Status: None   Collection Time: 08/28/22  4:53 AM  Result Value Ref Range   WBC 9.0 4.0 - 10.5 K/uL   RBC 5.27 4.22 - 5.81 MIL/uL   Hemoglobin 14.6 13.0 - 17.0 g/dL   HCT 60.4 54.0 - 98.1 %   MCV 84.4 80.0 - 100.0 fL   MCH 27.7 26.0 - 34.0 pg   MCHC 32.8 30.0 - 36.0 g/dL   RDW 19.1 47.8 - 29.5 %   Platelets 242 150 - 400 K/uL   nRBC 0.0 0.0 - 0.2 %   Neutrophils Relative % 66 %   Neutro Abs 6.0 1.7 - 7.7 K/uL   Lymphocytes Relative 26 %   Lymphs Abs 2.3 0.7 - 4.0 K/uL   Monocytes Relative 6 %   Monocytes Absolute 0.5 0.1 - 1.0 K/uL   Eosinophils Relative 1 %   Eosinophils Absolute 0.1 0.0 - 0.5 K/uL   Basophils Relative 1 %   Basophils Absolute 0.1 0.0 - 0.1 K/uL   Immature Granulocytes 0 %   Abs Immature Granulocytes 0.03 0.00 - 0.07 K/uL    Comment: Performed at [facility], [address], [place], [place] [address]  Comprehensive metabolic panel     Status: Abnormal   Collection Time: 08/28/22  4:53 AM  Result Value Ref Range    Sodium 133 (L) 135 - 145 mmol/L   Potassium 3.8 3.5 - 5.1 mmol/L   Chloride 99 98 - 111 mmol/L   CO2 26 22 - 32 mmol/L   Glucose, Bld 107 (H) 70 - 99 mg/dL    Comment: Glucose reference range applies only to samples taken after fasting for at least 8 hours.   BUN 14 6 - 20 mg/dL   Creatinine, Ser 8.65 0.61 - 1.24 mg/dL   Calcium 9.0 8.9 - 78.4 mg/dL   Total Protein 6.7 6.5 - 8.1 g/dL   Albumin 3.8 3.5 - 5.0 g/dL   AST 31 15 - 41 U/L   ALT 34 0 - 44 U/L   Alkaline Phosphatase 52 38 - 126 U/L   Total Bilirubin 0.6 0.3 - 1.2 mg/dL   GFR, Estimated >69 >62 mL/min    Comment: (NOTE) Calculated using the CKD-EPI Creatinine Equation (2021)  Anion gap 8 5 - 15    Comment: Performed at [facility], [address], [place], [place] [address]  Magnesium     Status: None   Collection Time: 08/28/22  4:53 AM  Result Value Ref Range   Magnesium 2.0 1.7 - 2.4 mg/dL    Comment: Performed at [facility], [address], [place], [place] [address]  Phosphorus     Status: None   Collection Time: 08/28/22  4:53 AM  Result Value Ref Range   Phosphorus 4.3 2.5 - 4.6 mg/dL    Comment: Performed at [facility], [address], [place], [place] [address]    MR Cervical Spine Wo Contrast  Result Date: 08/27/2022 CLINICAL DATA:  Weakness EXAM: MRI CERVICAL, THORACIC AND LUMBAR SPINE WITHOUT CONTRAST TECHNIQUE: Multiplanar and multiecho pulse sequences of the cervical spine, to include the craniocervical junction and cervicothoracic junction, and thoracic and lumbar spine, were obtained without intravenous contrast. COMPARISON:  None Available. Cervical spine CT and chest CT 09/08/2019 FINDINGS: MRI CERVICAL SPINE FINDINGS Alignment: There is slight reversal of the normal cervical curvature centered at C6 with trace grade 1 retrolisthesis of C6 on C7. There is no evidence of traumatic malalignment. Vertebrae: Vertebral body heights are preserved. Background marrow signal is normal. There  is benign intraosseous hemangioma in the C5 vertebral body. There is no suspicious marrow signal abnormality or marrow edema. Cord: Normal in signal and morphology. Posterior Fossa, vertebral arteries, paraspinal tissues: The imaged posterior fossa is unremarkable. The vertebral artery flow voids are normal. 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 The paraspinal soft tissues are otherwise unremarkable. Disc levels: C2-C3: No significant spinal canal or neural foraminal stenosis. C3-C4: There is mild uncovertebral ridging without significant spinal canal or neural foraminal stenosis C4-C5: No significant spinal canal or neural foraminal stenosis C5-C6: There is mild disc desiccation and narrowing with prominent right-sided uncovertebral ridging resulting moderate to severe right and no significant left neural foraminal stenosis and no significant spinal canal stenosis. C6-C7: There is mild disc desiccation and narrowing with left worse than right uncovertebral ridging resulting in moderate to severe left and moderate right neural foraminal stenosis without significant spinal canal stenosis C7-T1: No significant spinal canal or neural foraminal stenosis. MRI THORACIC SPINE FINDINGS Alignment:  Normal. Vertebrae: Vertebral body heights are preserved. Background marrow signal is normal. There is no suspicious marrow signal abnormality or marrow edema. Cord:  Normal in signal and morphology. Paraspinal and other soft tissues: Unremarkable. Disc levels: The disc heights are preserved. There is no significant disc herniation. There is no significant spinal canal or neural foraminal stenosis. MRI LUMBAR SPINE FINDINGS Segmentation: Standard; the lowest formed disc space is designated L5-S1. Alignment:  Normal. Vertebrae: Vertebral body heights are preserved. Background marrow signal is normal. There is no suspicious marrow signal  abnormality or marrow edema. There is a benign intraosseous hemangioma in the L5 vertebral body. Conus medullaris and cauda equina: Conus extends to the L1 level. Conus and cauda equina appear normal. Paraspinal and other soft tissues: Unremarkable. Disc levels: T12-L1: No significant spinal canal or neural foraminal stenosis L1-L2: No significant spinal canal or neural foraminal stenosis L2-L3: No significant spinal canal or neural foraminal stenosis. L3-L4: There is a left subarticular zone/foraminal protrusion and mild facet arthropathy with small effusions resulting in mild left and no significant right neural foraminal stenosis and no significant spinal canal  stenosis. L4-L5: There is a small right foraminal protrusion and moderate right and mild left facet arthropathy resulting in mild right and no significant left neural foraminal stenosis and no significant spinal canal stenosis L5-S1: Mild facet arthropathy without significant spinal canal or neural foraminal stenosis. IMPRESSION: 1. 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 2. Unremarkable MRI of the thoracic spine with no significant degenerative change, and no significant spinal canal or neural foraminal stenosis. 3. Left subarticular zone/foraminal protrusion and mild facet arthropathy at L3-L4 resulting in mild left neural foraminal stenosis. 4. Small right foraminal protrusion and moderate right and mild left facet arthropathy at L4-L5 resulting in mild right neural foraminal stenosis. 5. No significant spinal canal stenosis at any level in the spine. Electronically Signed   By: Lesia Hausen M.D.   On: 08/27/2022 19:59   MR THORACIC SPINE WO CONTRAST  Result Date: 08/27/2022 CLINICAL DATA:  Weakness EXAM: MRI CERVICAL, THORACIC AND LUMBAR SPINE WITHOUT CONTRAST TECHNIQUE: Multiplanar and multiecho pulse  sequences of the cervical spine, to include the craniocervical junction and cervicothoracic junction, and thoracic and lumbar spine, were obtained without intravenous contrast. COMPARISON:  None Available. Cervical spine CT and chest CT 09/08/2019 FINDINGS: MRI CERVICAL SPINE FINDINGS Alignment: There is slight reversal of the normal cervical curvature centered at C6 with trace grade 1 retrolisthesis of C6 on C7. There is no evidence of traumatic malalignment. Vertebrae: Vertebral body heights are preserved. Background marrow signal is normal. There is benign intraosseous hemangioma in the C5 vertebral body. There is no suspicious marrow signal abnormality or marrow edema. Cord: Normal in signal and morphology. Posterior Fossa, vertebral arteries, paraspinal tissues: The imaged posterior fossa is unremarkable. The vertebral artery flow voids are normal. 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 The paraspinal soft tissues are otherwise unremarkable. Disc levels: C2-C3: No significant spinal canal or neural foraminal stenosis. C3-C4: There is mild uncovertebral ridging without significant spinal canal or neural foraminal stenosis C4-C5: No significant spinal canal or neural foraminal stenosis C5-C6: There is mild disc desiccation and narrowing with prominent right-sided uncovertebral ridging resulting moderate to severe right and no significant left neural foraminal stenosis and no significant spinal canal stenosis. C6-C7: There is mild disc desiccation and narrowing with left worse than right uncovertebral ridging resulting in moderate to severe left and moderate right neural foraminal stenosis without significant spinal canal stenosis C7-T1: No significant spinal canal or neural foraminal stenosis. MRI THORACIC SPINE FINDINGS Alignment:  Normal. Vertebrae: Vertebral body heights are preserved. Background marrow signal is  normal. There is no suspicious marrow signal abnormality or marrow edema. Cord:  Normal in signal and morphology. Paraspinal and other soft tissues: Unremarkable. Disc levels: The disc heights are preserved. There is no significant disc herniation. There is no significant spinal canal or neural foraminal stenosis. MRI LUMBAR SPINE FINDINGS Segmentation: Standard; the lowest formed disc space is designated L5-S1. Alignment:  Normal. Vertebrae: Vertebral body heights are preserved. Background marrow signal is normal. There is no suspicious marrow signal abnormality or marrow edema. There is a benign intraosseous hemangioma in the L5 vertebral body. Conus medullaris and cauda equina: Conus extends to the L1 level. Conus and cauda equina appear normal. Paraspinal and other soft tissues: Unremarkable. Disc levels: T12-L1: No significant spinal canal or neural foraminal stenosis L1-L2: No significant  spinal canal or neural foraminal stenosis L2-L3: No significant spinal canal or neural foraminal stenosis. L3-L4: There is a left subarticular zone/foraminal protrusion and mild facet arthropathy with small effusions resulting in mild left and no significant right neural foraminal stenosis and no significant spinal canal stenosis. L4-L5: There is a small right foraminal protrusion and moderate right and mild left facet arthropathy resulting in mild right and no significant left neural foraminal stenosis and no significant spinal canal stenosis L5-S1: Mild facet arthropathy without significant spinal canal or neural foraminal stenosis. IMPRESSION: 1. 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 2. Unremarkable MRI of the thoracic spine with no significant degenerative change, and no significant spinal canal or neural foraminal stenosis. 3. Left subarticular zone/foraminal protrusion and mild  facet arthropathy at L3-L4 resulting in mild left neural foraminal stenosis. 4. Small right foraminal protrusion and moderate right and mild left facet arthropathy at L4-L5 resulting in mild right neural foraminal stenosis. 5. No significant spinal canal stenosis at any level in the spine. Electronically Signed   By: Lesia Hausen M.D.   On: 08/27/2022 19:59   MR LUMBAR SPINE WO CONTRAST  Result Date: 08/27/2022 CLINICAL DATA:  Weakness EXAM: MRI CERVICAL, THORACIC AND LUMBAR SPINE WITHOUT CONTRAST TECHNIQUE: Multiplanar and multiecho pulse sequences of the cervical spine, to include the craniocervical junction and cervicothoracic junction, and thoracic and lumbar spine, were obtained without intravenous contrast. COMPARISON:  None Available. Cervical spine CT and chest CT 09/08/2019 FINDINGS: MRI CERVICAL SPINE FINDINGS Alignment: There is slight reversal of the normal cervical curvature centered at C6 with trace grade 1 retrolisthesis of C6 on C7. There is no evidence of traumatic malalignment. Vertebrae: Vertebral body heights are preserved. Background marrow signal is normal. There is benign intraosseous hemangioma in the C5 vertebral body. There is no suspicious marrow signal abnormality or marrow edema. Cord: Normal in signal and morphology. Posterior Fossa, vertebral arteries, paraspinal tissues: The imaged posterior fossa is unremarkable. The vertebral artery flow voids are normal. 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 The paraspinal soft tissues are otherwise unremarkable. Disc levels: C2-C3: No significant spinal canal or neural foraminal stenosis. C3-C4: There is mild uncovertebral ridging without significant spinal canal or neural foraminal stenosis C4-C5: No significant spinal canal or neural foraminal stenosis C5-C6: There is mild disc desiccation and narrowing with prominent right-sided uncovertebral ridging  resulting moderate to severe right and no significant left neural foraminal stenosis and no significant spinal canal stenosis. C6-C7: There is mild disc desiccation and narrowing with left worse than right uncovertebral ridging resulting in moderate to severe left and moderate right neural foraminal stenosis without significant spinal canal stenosis C7-T1: No significant spinal canal or neural foraminal stenosis. MRI THORACIC SPINE FINDINGS Alignment:  Normal. Vertebrae: Vertebral body heights are preserved. Background marrow signal is normal. There is no suspicious marrow signal abnormality or marrow edema. Cord:  Normal in signal and morphology. Paraspinal and other soft tissues: Unremarkable. Disc levels: The disc heights are preserved. There is no significant disc herniation. There is no significant spinal canal or neural foraminal stenosis. MRI LUMBAR SPINE FINDINGS Segmentation: Standard; the lowest formed disc space is designated L5-S1. Alignment:  Normal. Vertebrae: Vertebral body heights are preserved. Background marrow signal is normal. There is no suspicious marrow signal abnormality or marrow edema. There is  a benign intraosseous hemangioma in the L5 vertebral body. Conus medullaris and cauda equina: Conus extends to the L1 level. Conus and cauda equina appear normal. Paraspinal and other soft tissues: Unremarkable. Disc levels: T12-L1: No significant spinal canal or neural foraminal stenosis L1-L2: No significant spinal canal or neural foraminal stenosis L2-L3: No significant spinal canal or neural foraminal stenosis. L3-L4: There is a left subarticular zone/foraminal protrusion and mild facet arthropathy with small effusions resulting in mild left and no significant right neural foraminal stenosis and no significant spinal canal stenosis. L4-L5: There is a small right foraminal protrusion and moderate right and mild left facet arthropathy resulting in mild right and no significant left neural foraminal  stenosis and no significant spinal canal stenosis L5-S1: Mild facet arthropathy without significant spinal canal or neural foraminal stenosis. IMPRESSION: 1. 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 2. Unremarkable MRI of the thoracic spine with no significant degenerative change, and no significant spinal canal or neural foraminal stenosis. 3. Left subarticular zone/foraminal protrusion and mild facet arthropathy at L3-L4 resulting in mild left neural foraminal stenosis. 4. Small right foraminal protrusion and moderate right and mild left facet arthropathy at L4-L5 resulting in mild right neural foraminal stenosis. 5. No significant spinal canal stenosis at any level in the spine. Electronically Signed   By: Lesia Hausen M.D.   On: 08/27/2022 19:59    Review of Systems  Constitutional:  Positive for activity change.  Musculoskeletal:  Positive for back pain, gait problem, myalgias, neck pain and neck stiffness.  Neurological:  Positive for weakness and numbness.  All other systems reviewed and are negative.  Blood pressure 130/70, pulse 83, temperature 97.7 F (36.5 C), temperature source Oral, resp. rate 18, height 5\' 11"  (1.803 m), weight 117.9 kg, SpO2 98 %. Physical Exam Constitutional:      Appearance: Normal appearance. He is obese.  HENT:     Head: Normocephalic and atraumatic.     Right Ear: Tympanic membrane, ear canal and external ear normal.     Left Ear: Tympanic membrane, ear canal and external ear normal.     Nose: Nose normal.     Mouth/Throat:     Mouth: Mucous membranes are moist.     Pharynx: Oropharynx is clear.  Eyes:     Extraocular Movements: Extraocular movements intact.     Conjunctiva/sclera: Conjunctivae normal.     Pupils: Pupils are equal, round, and reactive to light.  Musculoskeletal:        General: Normal range of  motion.  Skin:    General: Skin is warm and dry.  Neurological:     Mental Status: He is alert.     Comments: Patient is awake alert and oriented.  Cranial nerve examination is within the limits of normal.  Upper extremity strength to confrontation in the deltoids biceps triceps grips and intrinsics reveals good strength with normal tone and bulk.  Reflexes are 2+ in the biceps and triceps 2+ in the brachial radialis.  Sensation appears grossly normal in the upper extremities to touch and pin.  In the lower extremities sensation also is normal to touch and pin and confrontational motor strength is good in the iliopsoas the quadriceps tibialis anterior and gastrocs with normal tone and bulk no evidence of fasciculations are noted.  Deep tendon reflexes are also 2+ in the patellae and the Achilles both.  Palpation of his back does not reproduce any overt tenderness.  Observing the patient's gait reveals that he places most of his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

## 2022-08-28 NOTE — Plan of Care (Addendum)
Problem: Education: Goal: Knowledge of General Education information will improve Description: Including pain rating scale, medication(s)/side effects and non-pharmacologic comfort measures Outcome: Progressing Pt is aware he has an admitting generalized weakness and back pain.  He understand that pain medication will be administered upon request per MD's orders.    Problem: Clinical Measurements: Goal: Will remain free from infection Outcome: Progressing S/Sx of infection monitor q-shift.  Pt has remained afebrile thus far.     Problem: Clinical Measurements: Goal: Respiratory complications will improve Outcome: Progressing Respiratory status monitored and assessed q-shift.  Pt is on room air with O2 saturations at 97-98% and respiration rate of 18 breaths per minute.  Pt has denied c/o SOB and DOE.    Problem: Activity: Goal: Risk for activity intolerance will decrease Outcome: Progressing Pt is independent of all ADL.  He x1 assist when OOB to ambulate his room.  PT came to assess pt this shift.  See their note.    Problem: Coping: Goal: Level of anxiety will decrease Outcome: Progressing Pt has not endorsed c/o anxiety thus far.    Problem: Nutrition: Goal: Adequate nutrition will be maintained Outcome: Progressing Pt was observed to have eaten 90-95% of his meals thus far.     Problem: Elimination: Goal: Will not experience complications related to bowel motility Outcome: Progressing Pt has not endorsed c/o of constipation of bowel incontinence.  He understands that he is at a higher risk for developing constipation because of opioid use.  Pt stated that he will hydrate more and eat more fiber.    Problem: Elimination: Goal: Will not experience complications related to urinary retention Outcome: Progressing Pt has not endorsed c/o of urinary retention or incontinence.  He does not have a distend abdomen.            Problem: Safety: Goal: Ability to remain free from  injury will improve Outcome: Progressing Pt has remained safe from falls thus far.  Instructed pt to utilize RN call light for assistance.  Hourly rounds performed.  Bed in lowest position, locked with two upper side rails engaged.  Belongings and call light within reach.    Problem: Skin Integrity: Goal: Risk for impaired skin integrity will decrease Outcome: Progressing Skin integrity monitored and assessed q-shift.  Instructed pt to turn and reposition himself q2 hours to prevent further skin impairment.  Tubes and drains assessed for device related pressure sores.  Pt is continent of both his bowel and bladder.  Dressing changes performed per MD's orders.   Problem: Pain Managment: Goal: General experience of comfort will improve Outcome: Progressing Pt has endorsed c/o 7-9/10 bilateral mid lower back pain radiating to his BLE describing the pain as a sharp pain.  Reiterated pain scale so he could adequately rate his pain.  Pt stated his pain goal this admission would be 4/10.  Discussed nonpharmacological methods to help reduce s/sx of pain.  Interventions given per pt's request and MD's orders.

## 2022-08-28 NOTE — TOC Initial Note (Signed)
Transition of Care [facility]) - Initial/Assessment Note    Patient Details  Name: Kristopher [name] MRN: [id number] Date of Birth: 1970-06-26  [facility]) CM/SW Contact:    Kermit Balo, RN Phone Number: 08/28/2022, 2:21 PM  Clinical Narrative:                 Pt is from home with his cousins and their children. 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 Awaiting neurosurgery consult for back pain. Pt states he assists the cousins at home but they could provide supervision for him at home.  Pt is supposed to be taking prescription medications but has not follow up with a PCP for these. CM will follow and arrange PCP closer to d/c.  Pt drives self but states one of the cousins can provide transportation if needed.  Current recommendations are for CIR. ToC following.  Expected Discharge Plan: IP Rehab Facility Barriers to Discharge: Continued Medical Work up   Patient Goals and CMS Choice   CMS Medicare.gov Compare Post Acute Care list provided to:: Patient Choice offered to / list presented to : Patient      Expected Discharge Plan and Services   Discharge Planning Services: CM Consult Post Acute Care Choice: IP Rehab Living arrangements for the past 2 months: Single Family Home                                      Prior Living Arrangements/Services Living arrangements for the past 2 months: Single Family Home Lives with:: Relatives (cousins) Patient language and need for interpreter reviewed:: Yes Do you feel safe going back to the place where you live?: Yes        Care giver support system in place?: Yes (comment) Current home services: DME (has access to walker/ shower seat--ramp at the home) Criminal Activity/Legal Involvement Pertinent to Current Situation/Hospitalization: No - Comment as needed  Activities of Daily Living Home Assistive Devices/Equipment: Eyeglasses ADL Screening (condition at time of admission) Patient's  cognitive ability adequate to safely complete daily activities?: Yes Is the patient deaf or have difficulty hearing?: No Does the patient have difficulty seeing, even when wearing glasses/contacts?: No Does the patient have difficulty concentrating, remembering, or making decisions?: No Patient able to express need for assistance with ADLs?: Yes Does the patient have difficulty dressing or bathing?: Yes Independently performs ADLs?: No Communication: Independent Dressing (OT): Needs assistance Is this a change from baseline?: Pre-admission baseline Grooming: Independent Is this a change from baseline?: Pre-admission baseline Feeding: Independent Bathing: Needs assistance Is this a change from baseline?: Pre-admission baseline Toileting: Independent In/Out Bed: Needs assistance Is this a change from baseline?: Pre-admission baseline Walks in Home: Needs assistance Is this a change from baseline?: Pre-admission baseline Does the patient have difficulty walking or climbing stairs?: Yes Weakness of Legs: Both Weakness of Arms/Hands: Both  Permission Sought/Granted                  Emotional Assessment Appearance:: Appears stated age Attitude/Demeanor/Rapport: Engaged Affect (typically observed): Accepting Orientation: : Oriented to Self, Oriented to Place, Oriented to  Time, Oriented to Situation   Psych Involvement: No (comment)  Admission diagnosis:  Neck pain [M54.2] Weakness [R53.1] Intractable pain [R52] Patient Active Problem List   Diagnosis Date Noted   Intractable pain 08/27/2022   Tobacco abuse 02/10/2020   Acute on chronic  pancreatitis (HCC) 02/09/2020   ETOH abuse 02/09/2020   Hemorrhoids 02/09/2020   Portal hypertension (HCC) 02/09/2020   Alcoholic cirrhosis of liver without ascites (HCC) 02/09/2020   Hypokalemia 02/09/2020   Abdominal pain 01/21/2019   Intractable nausea and vomiting 01/20/2019   Acute alcoholic pancreatitis 01/06/2019   PCP:   Patient, No Pcp Per Pharmacy:   [facility][id number] [place], [place] - [address] AT [facility] OF [address] [place] [address] [place] [address] Phone: [phone] Fax: [phone]  [facility] - [place], [place] - [address] [place] [address] Phone: [phone] Fax: [phone]  [facility] #[id number] [place] Otto, [place] - [address] AT [facility] OF [address] & [address] [facility] [address] Phone: [phone] Fax: [phone]     Social Determinants of Health (SDOH) Social History: SDOH Screenings   Food Insecurity: No Food Insecurity (08/28/2022)  Housing: Medium Risk (08/28/2022)  Transportation Needs: No Transportation Needs (08/28/2022)  Utilities: Not At Risk (08/28/2022)  Tobacco Use: High Risk (08/27/2022)   SDOH Interventions:     Readmission Risk Interventions     No data to display

## 2022-08-28 NOTE — Evaluation (Signed)
Physical Therapy Evaluation Patient Details Name: Kristopher [name] MRN: [id number] DOB: 1970/04/28 Today's Date: 08/28/2022  History of Present Illness  Pt is a 52 y.o male presenting with neck and radiating lower back pain admitted (08/27/2022). 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 PMH includes COPD, DM, HTN, Pancreatitis.   Clinical Impression  Upon evaluation, received resting supine in bed. Pt alert and oriented but reports extreme pain in lower back to legs. Prior to admission, pt was independent with mobility and ADLs; he reports being able to get in and out of truck without pain. Currently pt requires physical assistance with bed mobility and functional mobility. Pt gait pattern presented with minimal dorsiflexion and poor foot clearance; verbal cue provided to increase foot clearance. Sensation impaired in both lower extremities, specifically located on dorsal aspect of foot and great toe, more on left when compared to right. Bilateral muscle weakness in upper and lower extremities due to radiating pain. End of session, pt was left seated upright in chair. Will benefit from skilled physical therapy in order to facilitate functional mobility and discharge.    Recommendations for follow up therapy are one component of a multi-disciplinary discharge planning process, led by the attending physician.  Recommendations may be updated based on patient status, additional functional criteria and insurance authorization.  Follow Up Recommendations       Assistance Recommended at Discharge Frequent or constant Supervision/Assistance  Patient can return home with the following  A little help with walking and/or transfers;A little help with bathing/dressing/bathroom;Assistance with  cooking/housework;Assist for transportation;Help with stairs or ramp for entrance    Equipment Recommendations None recommended by PT  Recommendations for Other Services       Functional Status Assessment Patient has had a recent decline in their functional status and demonstrates the ability to make significant improvements in function in a reasonable and predictable amount of time.     Precautions / Restrictions Precautions Precautions: Fall Restrictions Weight Bearing Restrictions: No      Mobility  Bed Mobility Overal bed mobility: Needs Assistance Bed Mobility: Rolling, Sidelying to Sit Rolling: Min guard Sidelying to sit: Min guard       General bed mobility comments: Pt able to perform bed mobility with min guard; required verbal cues for hand placement and rail support to transition into sitting    Transfers Overall transfer level: Needs assistance Equipment used: Rolling walker (2 wheels) Transfers: Sit to/from Stand Sit to Stand: +2 physical assistance, Min assist           General transfer comment: Pt able to transfer from EOB into standing with min assist for power up; verbal cue for hand placement on RW and bed.    Ambulation/Gait Ambulation/Gait assistance: Min assist, +2 safety/equipment Gait Distance (Feet): 10 Feet Assistive device: Rolling walker (2 wheels) Gait Pattern/deviations: Step-to pattern, Decreased step length - right, Decreased step length - left, Decreased stride length, Decreased dorsiflexion - left, Decreased dorsiflexion - right, Trunk flexed, Antalgic Gait velocity: Decreased Gait velocity interpretation: <1.31 ft/sec, indicative of household ambulator Pre-gait activities: Forward Steps with RW General Gait Details: Pt presented with minor knee buckle but able to recover with UE support; ambulation presented with inconsistent toe drag with right foot initially then switching to left foot.  Stairs  Wheelchair  Mobility    Modified Rankin (Stroke Patients Only)       Balance                                             Pertinent Vitals/Pain Pain Assessment Pain Assessment: 0-10 Pain Score: 10-Worst pain ever Pain Location: Lower Back radiating down the Legs Pain Descriptors / Indicators: Burning, Constant, Guarding, Radiating, Sharp Pain Intervention(s): Limited activity within patient's tolerance, Monitored during session, Patient requesting pain meds-RN notified    Home Living Family/patient expects to be discharged to:: Private residence Living Arrangements: Other relatives Available Help at Discharge: Family;Available 24 hours/day Type of Home: House Home Access: Ramped entrance       Home Layout: One level Home Equipment: Agricultural consultant (2 wheels);Shower seat Additional Comments: Living with first cousin, his wife, and their daughter. Pt reports he has been on an air mattress.    Prior Function Prior Level of Function : Independent/Modified Independent;Working/employed;Driving             Mobility Comments: Able to get in and out of his truck without pain or mobility limitations       Hand Dominance        Extremity/Trunk Assessment        Lower Extremity Assessment Lower Extremity Assessment: Generalized weakness;RLE deficits/detail;LLE deficits/detail RLE Deficits / Details: Pt presenting with weakness due to pain RLE Sensation: decreased light touch (Decreased sensation at toes and dorsal aspect of foot., Heel WNL) LLE Deficits / Details: Pt presenting with weakness due to pain LLE Sensation: decreased light touch (Decreased sensation at toes and dorsal aspect of foot., Heel WNL)    Cervical / Trunk Assessment Cervical / Trunk Assessment: Kyphotic  Communication   Communication: No difficulties  Cognition                                                General Comments      Exercises     Assessment/Plan     PT Assessment Patient needs continued PT services  PT Problem List Decreased strength;Decreased range of motion;Decreased activity tolerance;Decreased balance;Decreased mobility;Decreased knowledge of use of DME;Decreased safety awareness;Decreased knowledge of precautions;Pain;Impaired sensation       PT Treatment Interventions DME instruction;Gait training;Stair training;Functional mobility training;Therapeutic activities;Therapeutic exercise;Balance training;Neuromuscular re-education;Patient/family education    PT Goals (Current goals can be found in the Care Plan section)  Acute Rehab PT Goals Patient Stated Goal: Want to be able to walk and get in and out of truck with out pain PT Goal Formulation: With patient Time For Goal Achievement: 09/04/22 Potential to Achieve Goals: Fair    Frequency Min 4X/week     Co-evaluation               AM-PAC PT "6 Clicks" Mobility  Outcome Measure Help needed turning from your back to your side while in a flat bed without using bedrails?: A Little Help needed moving from lying on your back to sitting on the side of a flat bed without using bedrails?: A Little Help needed moving to and from a bed to a chair (including a wheelchair)?: A Little Help needed standing up from a chair using your arms (e.g., wheelchair or bedside chair)?: A Little  Help needed to walk in hospital room?: A Little Help needed climbing 3-5 steps with a railing? : Total 6 Click Score: 16    End of Session Equipment Utilized During Treatment: Gait belt Activity Tolerance: Patient limited by pain Patient left: in chair;with call bell/phone within reach Nurse Communication: Mobility status PT Visit Diagnosis: Unsteadiness on feet (R26.81);Pain;Other symptoms and signs involving the nervous system (R29.898) Pain - part of body:  (radiculopathy down to feet)    Time: 4098-1191 PT Time Calculation (min) (ACUTE ONLY): 27 min   Charges:   PT Evaluation $PT Eval  Moderate Complexity: 1 Mod PT Treatments $Gait Training: 8-22 mins        Christene Lye, SPT Acute Rehabilitation Services [phone] Secure chat preferred    Christene Lye 08/28/2022, 2:26 PM
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
## 2022-08-29 DIAGNOSIS — M542 Cervicalgia: Secondary | ICD-10-CM

## 2022-08-29 DIAGNOSIS — E871 Hypo-osmolality and hyponatremia: Secondary | ICD-10-CM | POA: Insufficient documentation

## 2022-08-29 DIAGNOSIS — I10 Essential (primary) hypertension: Secondary | ICD-10-CM | POA: Insufficient documentation

## 2022-08-29 DIAGNOSIS — R52 Pain, unspecified: Secondary | ICD-10-CM | POA: Diagnosis not present

## 2022-08-29 DIAGNOSIS — R531 Weakness: Secondary | ICD-10-CM | POA: Diagnosis not present

## 2022-08-29 DIAGNOSIS — J449 Chronic obstructive pulmonary disease, unspecified: Secondary | ICD-10-CM | POA: Insufficient documentation

## 2022-08-29 LAB — MAGNESIUM: Magnesium: 1.9 mg/dL (ref 1.7–2.4)

## 2022-08-29 LAB — CBC WITH DIFFERENTIAL/PLATELET
Abs Immature Granulocytes: 0.02 10*3/uL (ref 0.00–0.07)
Basophils Absolute: 0.1 10*3/uL (ref 0.0–0.1)
Basophils Relative: 1 %
Eosinophils Absolute: 0.3 10*3/uL (ref 0.0–0.5)
Eosinophils Relative: 4 %
HCT: 40.5 % (ref 39.0–52.0)
Hemoglobin: 13.8 g/dL (ref 13.0–17.0)
Immature Granulocytes: 0 %
Lymphocytes Relative: 40 %
Lymphs Abs: 3 10*3/uL (ref 0.7–4.0)
MCH: 29.3 pg (ref 26.0–34.0)
MCHC: 34.1 g/dL (ref 30.0–36.0)
MCV: 86 fL (ref 80.0–100.0)
Monocytes Absolute: 0.5 10*3/uL (ref 0.1–1.0)
Monocytes Relative: 7 %
Neutro Abs: 3.7 10*3/uL (ref 1.7–7.7)
Neutrophils Relative %: 48 %
Platelets: 212 10*3/uL (ref 150–400)
RBC: 4.71 MIL/uL (ref 4.22–5.81)
RDW: 13.8 % (ref 11.5–15.5)
WBC: 7.5 10*3/uL (ref 4.0–10.5)
nRBC: 0 % (ref 0.0–0.2)

## 2022-08-29 LAB — COMPREHENSIVE METABOLIC PANEL
ALT: 34 U/L (ref 0–44)
AST: 29 U/L (ref 15–41)
Albumin: 3.6 g/dL (ref 3.5–5.0)
Alkaline Phosphatase: 51 U/L (ref 38–126)
Anion gap: 9 (ref 5–15)
BUN: 13 mg/dL (ref 6–20)
CO2: 27 mmol/L (ref 22–32)
Calcium: 9.1 mg/dL (ref 8.9–10.3)
Chloride: 99 mmol/L (ref 98–111)
Creatinine, Ser: 0.99 mg/dL (ref 0.61–1.24)
GFR, Estimated: >60 mL/min (ref >60)
Glucose, Bld: 179 mg/dL — ABNORMAL HIGH (ref 70–99)
Potassium: 4 mmol/L (ref 3.5–5.1)
Sodium: 135 mmol/L (ref 135–145)
Total Bilirubin: 0.6 mg/dL (ref 0.3–1.2)
Total Protein: 6.6 g/dL (ref 6.5–8.1)

## 2022-08-29 LAB — PHOSPHORUS: Phosphorus: 4.3 mg/dL (ref 2.5–4.6)

## 2022-08-29 MED ORDER — OXYCODONE HCL 5 MG PO TABS
5.0000 mg | ORAL_TABLET | Freq: Once | ORAL | Status: AC
  Administered 2022-08-29: 5 mg via ORAL
  Filled 2022-08-29: qty 1

## 2022-08-29 MED ORDER — NALOXONE HCL 0.4 MG/ML IJ SOLN: 0.4000 mg | INTRAMUSCULAR | Status: DC | PRN

## 2022-08-29 MED ORDER — OXYCODONE HCL 5 MG PO TABS: 5.0000 mg | ORAL_TABLET | Freq: Four times a day (QID) | ORAL | Status: DC | PRN

## 2022-08-29 MED ORDER — LIDOCAINE 5 % EX PTCH
1.0000 | MEDICATED_PATCH | CUTANEOUS | Status: DC
  Administered 2022-08-29 – 2022-08-30 (×2): 1 via TRANSDERMAL
  Filled 2022-08-29 (×2): qty 1

## 2022-08-29 MED ORDER — OXYCODONE HCL 5 MG PO TABS
5.0000 mg | ORAL_TABLET | Freq: Four times a day (QID) | ORAL | Status: DC | PRN
  Administered 2022-08-30: 5 mg via ORAL
  Filled 2022-08-29 (×2): qty 1

## 2022-08-29 MED ORDER — FENTANYL CITRATE PF 50 MCG/ML IJ SOSY
12.5000 ug | PREFILLED_SYRINGE | INTRAMUSCULAR | Status: DC | PRN
  Administered 2022-08-29 (×2): 50 ug via INTRAVENOUS
  Filled 2022-08-29 (×2): qty 1

## 2022-08-29 MED ORDER — POLYETHYLENE GLYCOL 3350 17 G PO PACK
17.0000 g | PACK | Freq: Two times a day (BID) | ORAL | Status: AC
  Administered 2022-08-29 – 2022-08-30 (×4): 17 g via ORAL
  Filled 2022-08-29 (×4): qty 1

## 2022-08-29 NOTE — TOC Progression Note (Addendum)
Transition of Care [facility]) - Progression Note    Patient Details  Name: Kristopher [name] MRN: [id number] Date of Birth: April 17, 1970  [facility]) CM/SW Contact  Alaa Mullally A [place], [place] Phone Number: 08/29/2022, 5:03 PM  Clinical Narrative:     CSW met with pt at bedside. He said he was ok with CSW making referral for SNF. He stated he was a little uncertain of being able to go and wanted to follow up with his company to inform them of his potential need for extended time off. CSW will start referral process and pt will reach out to his company in the mean time. He said he did not have a preference as of yet and plans to discuss with his fiance about potential places.   SNF work up needs to be completed.  TOC will continue to follow.   Expected Discharge Plan: IP Rehab Facility Barriers to Discharge: Continued Medical Work up  Expected Discharge Plan and Services   Discharge Planning Services: CM Consult Post Acute Care Choice: IP Rehab Living arrangements for the past 2 months: Single Family Home                                       Social Determinants of Health (SDOH) Interventions SDOH Screenings   Food Insecurity: No Food Insecurity (08/28/2022)  Housing: Medium Risk (08/28/2022)  Transportation Needs: No Transportation Needs (08/28/2022)  Utilities: Not At Risk (08/28/2022)  Tobacco Use: High Risk (08/27/2022)    Readmission Risk Interventions     No data to display

## 2022-08-29 NOTE — Progress Notes (Signed)
? ?  Inpatient Rehab Admissions Coordinator : ? ?Per therapy recommendations, patient was screened for CIR candidacy by Arlett Goold RN MSN.  At this time patient appears to be a potential candidate for CIR. I will place a rehab consult per protocol for full assessment. Please call me with any questions. ? ?Demetria Iwai RN MSN ?Admissions Coordinator ?[phone] ?  ?

## 2022-08-29 NOTE — Consult Note (Signed)
Physical Medicine and Rehabilitation Consult Reason for Consult: Pain and weakness Referring Physician: Dr. David Stall   HPI: Kristopher [name] is a 52 y.o. male who presented with radiating back pain from his neck and low back. PMH includes COPD, DM, HTN, and pancreatitis. 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 Physical Medicine & Rehabilitation was consulted to assess candidacy for CIR.     ROS +pain Past Medical History:  Diagnosis Date   Alcohol dependence (HCC)    COPD (chronic obstructive pulmonary disease) (HCC)    Diabetes mellitus without complication (HCC)    GERD (gastroesophageal reflux disease)    Hypertension    Pancreatitis    Pancreatitis    Past Surgical History:  Procedure Laterality Date   ABDOMINAL SURGERY     ESOPHAGOGASTRODUODENOSCOPY N/A 02/12/2020   Procedure: ESOPHAGOGASTRODUODENOSCOPY (EGD);  Surgeon: Toledo, Boykin Nearing, MD;  Location: [facility] ENDOSCOPY;  Service: Gastroenterology;  Laterality: N/A;   Family History  Problem Relation Age of Onset   Diabetes Other    Social History:  reports that he has been smoking cigarettes. He has been smoking an average of 1 pack per day. He has never used smokeless tobacco. He reports current alcohol use of about 18.0 standard drinks of alcohol per week. He reports that he does not currently use drugs after having used the following drugs: Marijuana and Cocaine. Allergies:  Allergies  Allergen Reactions   Ivp Dye [Iodinated Contrast Media] Anaphylaxis   Medications Prior to Admission  Medication Sig Dispense Refill   albuterol (VENTOLIN HFA) 108 (90 Base) MCG/ACT inhaler Inhale 1-2 puffs into the lungs every 6 (six) hours as needed for wheezing or shortness of breath. 8 g 2   Multiple Vitamin (MULTIVITAMIN WITH MINERALS) TABS tablet Take 1 tablet by mouth daily. 90 tablet 0   benzonatate  (TESSALON) 100 MG capsule Take 1 capsule (100 mg total) by mouth 3 (three) times daily as needed for cough. (Patient not taking: Reported on 08/29/2022) 21 capsule 0    Home: Home Living Family/patient expects to be discharged to:: Private residence Living Arrangements: Other relatives Available Help at Discharge: Family, Available 24 hours/day Type of Home: House Home Access: Ramped entrance Home Layout: One level Bathroom Shower/Tub: Engineer, manufacturing systems: Standard Home Equipment: Agricultural consultant (2 wheels), Shower seat Additional Comments: Living with first cousin, his wife, and their daughter. Pt reports he has been on an air mattress.  Functional History: Prior Function Prior Level of Function : Independent/Modified Independent, Working/employed, Driving Mobility Comments: Able to get in and out of his truck without pain or mobility limitations Functional Status:  Mobility: Bed Mobility Overal bed mobility: Needs Assistance Bed Mobility: Rolling, Sidelying to Sit Rolling: Modified independent (Device/Increase time) Sidelying to sit: Supervision General bed mobility comments: Improved ability to perform rolling and sitting EOB with HOB elevated; no physical assistance provided Transfers Overall transfer level: Needs assistance Equipment used: Rolling walker (2 wheels) Transfers: Sit to/from Stand Sit to Stand: Min assist General transfer comment: Pt transferred from EOB to standing with RW at minA. Performed serial sit to stand x3. Pt knee buckled after 3rd trial, provided seated rest break prior to tansfer gait training. Ambulation/Gait Ambulation/Gait assistance: Min assist, Mod assist Gait Distance (Feet): 5 Feet Assistive device: Rolling walker (2 wheels) Gait Pattern/deviations: Step-to pattern, Decreased step length - right, Decreased step length - left, Decreased stride  length, Decreased dorsiflexion - left, Decreased dorsiflexion - right, Trunk flexed,  Antalgic General Gait Details: Pt presented with minor knee buckle but able to recover well with RW and chair support. Still presented with toe drag bilaterally; verbally cued to lift foot off the floor; still unable to perform heel strike. Gait velocity: Decreased Gait velocity interpretation: <1.31 ft/sec, indicative of household ambulator Pre-gait activities: Forward Steps with RW    ADL:    Cognition: Cognition Overall Cognitive Status: Within Functional Limits for tasks assessed Orientation Level: Oriented X4 Cognition Arousal/Alertness: Awake/alert Behavior During Therapy: WFL for tasks assessed/performed Overall Cognitive Status: Within Functional Limits for tasks assessed  Blood pressure (!) 139/94, pulse 97, temperature 98.4 F (36.9 C), temperature source Oral, resp. rate 18, height 5\' 11"  (1.803 m), weight 117.9 kg, SpO2 97 %. Physical Exam Gen: no distress, normal appearing HEENT: oral mucosa pink and moist, NCAT Cardio: Reg rate Chest: normal effort, normal rate of breathing Abd: soft, non-distended Ext: no edema Psych: pleasant, normal affect Skin: intact Neuro: Alert and oriented x3 Musculoskeletal: Unable to lift both legs in seated position, patient is limited by pain  Results for orders placed or performed during the hospital encounter of 08/27/22 (from the past 24 hour(s))  CBC with Differential/Platelet     Status: None   Collection Time: 08/29/22  4:36 AM  Result Value Ref Range   WBC 7.5 4.0 - 10.5 K/uL   RBC 4.71 4.22 - 5.81 MIL/uL   Hemoglobin 13.8 13.0 - 17.0 g/dL   HCT 60.4 54.0 - 98.1 %   MCV 86.0 80.0 - 100.0 fL   MCH 29.3 26.0 - 34.0 pg   MCHC 34.1 30.0 - 36.0 g/dL   RDW 19.1 47.8 - 29.5 %   Platelets 212 150 - 400 K/uL   nRBC 0.0 0.0 - 0.2 %   Neutrophils Relative % 48 %   Neutro Abs 3.7 1.7 - 7.7 K/uL   Lymphocytes Relative 40 %   Lymphs Abs 3.0 0.7 - 4.0 K/uL   Monocytes Relative 7 %   Monocytes Absolute 0.5 0.1 - 1.0 K/uL    Eosinophils Relative 4 %   Eosinophils Absolute 0.3 0.0 - 0.5 K/uL   Basophils Relative 1 %   Basophils Absolute 0.1 0.0 - 0.1 K/uL   Immature Granulocytes 0 %   Abs Immature Granulocytes 0.02 0.00 - 0.07 K/uL  Comprehensive metabolic panel     Status: Abnormal   Collection Time: 08/29/22  4:36 AM  Result Value Ref Range   Sodium 135 135 - 145 mmol/L   Potassium 4.0 3.5 - 5.1 mmol/L   Chloride 99 98 - 111 mmol/L   CO2 27 22 - 32 mmol/L   Glucose, Bld 179 (H) 70 - 99 mg/dL   BUN 13 6 - 20 mg/dL   Creatinine, Ser 6.21 0.61 - 1.24 mg/dL   Calcium 9.1 8.9 - 30.8 mg/dL   Total Protein 6.6 6.5 - 8.1 g/dL   Albumin 3.6 3.5 - 5.0 g/dL   AST 29 15 - 41 U/L   ALT 34 0 - 44 U/L   Alkaline Phosphatase 51 38 - 126 U/L   Total Bilirubin 0.6 0.3 - 1.2 mg/dL   GFR, Estimated >65 >78 mL/min   Anion gap 9 5 - 15  Phosphorus     Status: None   Collection Time: 08/29/22  4:36 AM  Result Value Ref Range   Phosphorus 4.3 2.5 - 4.6 mg/dL  Magnesium  Status: None   Collection Time: 08/29/22  4:36 AM  Result Value Ref Range   Magnesium 1.9 1.7 - 2.4 mg/dL   MR BRAIN WO CONTRAST  Result Date: 08/28/2022 CLINICAL DATA:  Acute neurologic deficit EXAM: MRI HEAD WITHOUT CONTRAST TECHNIQUE: Multiplanar, multiecho pulse sequences of the brain and surrounding structures were obtained without intravenous contrast. COMPARISON:  None Available. FINDINGS: Brain: No acute infarct, mass effect or extra-axial collection. No acute or chronic hemorrhage. Normal white matter signal, parenchymal volume and CSF spaces. The midline structures are normal. Vascular: Major flow voids are preserved. Skull and upper cervical spine: Normal calvarium and skull base. Visualized upper cervical spine and soft tissues are normal. Sinuses/Orbits:No paranasal sinus fluid levels or advanced mucosal thickening. No mastoid or middle ear effusion. Normal orbits. IMPRESSION: Normal brain MRI. Electronically Signed   By: Deatra Robinson M.D.    On: 08/28/2022 22:33     MEDICAL RECOMMENDATIONS: Severe pain: Discussed with rehab admissions coordinator who has discussed with patient's family and patient has a history of admissions for pain and weakness resulting in inability to walk with subsequent rapid recovery. Patient currently appears fixated on his pain management and says he does find relief from the oxycodone and IV fentanyl. Discussed Meloxicam to decrease inflammation but he states this has not helped him in the past  2) Lower extremity weakness: -appears to be related to pain/nonphysiologic. NSGY note reviewed and there is no MRI evidence of cord compression.  -will place outpatient physical medicine and rehabilitation referral to assist with outpatient pain medication/rehabilitation needs. Urine tox reviewed and is positive for opioids and alcohol.  3) HTN: reviewed and stable  I have personally performed a face to face diagnostic evaluation of this patient. Additionally, I have examined the patient's medical record including any pertinent labs and radiographic images. If the physician assistant has documented in this note, I have reviewed and edited or otherwise concur with the physician assistant's documentation.  Thanks,  Horton Chin, MD 08/29/2022

## 2022-08-29 NOTE — Progress Notes (Signed)
Physical Therapy Treatment Patient Details Name: Kristopher [name] MRN: [id number] DOB: 16-Oct-1970 Today's Date: 08/29/2022   History of Present Illness Pt is a 52 y.o male presenting with neck and radiating lower back pain. PMH includes COPD, DM, HTN, Pancreatitis. 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    PT Comments    Pt progressing towards goals per plan of care. Able to perform bed mobility to EOB without physical assistance. Progressed to EOB exercises; x3 serial sit to stands with elevated bed and RW support. Pt experienced minor knee buckle with last repetition but able to recover and sit EOB. Pt still demonstrated lower extremity weakness with gait training, presented with toe drag on both sides; verbal cue to clear foot off floor. Pt experienced second minor knee buckle towards chair requiring modA for support; able to recover into hospital chair. End of session with pt in seated in chair and provided education on LE exercises in supine and seated position. Recommend next session to include nerve glides as pain intervention for lower extremities. Pt will continue to benefit from skilled physical therapy to facilitate improvements in functional mobility.    Recommendations for follow up therapy are one component of a multi-disciplinary discharge planning process, led by the attending physician.  Recommendations may be updated based on patient status, additional functional criteria and insurance authorization.  Follow Up Recommendations       Assistance Recommended at Discharge Frequent or constant Supervision/Assistance  Patient can return home with the following A little help with walking and/or transfers;A little help with bathing/dressing/bathroom;Assistance with  cooking/housework;Assist for transportation;Help with stairs or ramp for entrance   Equipment Recommendations  None recommended by PT    Recommendations for Other Services       Precautions / Restrictions Precautions Precautions: Fall Restrictions Weight Bearing Restrictions: No     Mobility  Bed Mobility Overal bed mobility: Needs Assistance Bed Mobility: Rolling, Sidelying to Sit Rolling: Modified independent (Device/Increase time) Sidelying to sit: Supervision       General bed mobility comments: Improved ability to perform rolling and sitting EOB with HOB elevated; no physical assistance provided    Transfers Overall transfer level: Needs assistance Equipment used: Rolling walker (2 wheels) Transfers: Sit to/from Stand Sit to Stand: Min assist           General transfer comment: Pt transferred from EOB to standing with RW at minA. Performed serial sit to stand x3. Pt knee buckled after 3rd trial, provided seated rest break prior to tansfer gait training.    Ambulation/Gait Ambulation/Gait assistance: Min assist, Mod assist Gait Distance (Feet): 5 Feet Assistive device: Rolling walker (2 wheels) Gait Pattern/deviations: Step-to pattern, Decreased step length - right, Decreased step length - left, Decreased stride length, Decreased dorsiflexion - left, Decreased dorsiflexion - right, Trunk flexed, Antalgic Gait velocity: Decreased Gait velocity interpretation: <1.31 ft/sec, indicative of household ambulator Pre-gait activities: Forward Steps with RW General Gait Details: Pt presented with minor knee buckle but able to recover well with RW and chair support. Still presented with toe drag bilaterally; verbally cued to lift foot off the floor; still unable to perform heel strike.   Stairs             Wheelchair Mobility    Modified Rankin (Stroke Patients Only)  Balance Overall balance assessment: Needs assistance Sitting-balance support: Feet  supported, No upper extremity supported Sitting balance-Leahy Scale: Good     Standing balance support: Bilateral upper extremity supported, During functional activity, Reliant on assistive device for balance Standing balance-Leahy Scale: Poor                              Cognition Arousal/Alertness: Awake/alert Behavior During Therapy: WFL for tasks assessed/performed Overall Cognitive Status: Within Functional Limits for tasks assessed                                          Exercises General Exercises - Lower Extremity Long Arc Quad: AROM, Strengthening, Other reps (comment), Seated (8 reps) Heel Slides: 5 reps, Both, Supine, AAROM    General Comments        Pertinent Vitals/Pain Pain Assessment Pain Assessment: 0-10 Pain Score: 8  Pain Location: Lower Back radiating to the right hip Pain Descriptors / Indicators: Burning, Constant, Guarding, Radiating, Squeezing Pain Intervention(s): Limited activity within patient's tolerance, Monitored during session, Patient requesting pain meds-RN notified    Home Living                          Prior Function            PT Goals (current goals can now be found in the care plan section) Acute Rehab PT Goals Patient Stated Goal: Want to be able to walk and get in and out of truck with out pain PT Goal Formulation: With patient Time For Goal Achievement: 09/04/22 Potential to Achieve Goals: Fair Progress towards PT goals: Progressing toward goals    Frequency    Min 4X/week      PT Plan Current plan remains appropriate    Co-evaluation              AM-PAC PT "6 Clicks" Mobility   Outcome Measure  Help needed turning from your back to your side while in a flat bed without using bedrails?: A Little Help needed moving from lying on your back to sitting on the side of a flat bed without using bedrails?: A Little Help needed moving to and from a bed to a chair (including a  wheelchair)?: A Little Help needed standing up from a chair using your arms (e.g., wheelchair or bedside chair)?: A Little Help needed to walk in hospital room?: A Lot Help needed climbing 3-5 steps with a railing? : Total 6 Click Score: 15    End of Session Equipment Utilized During Treatment: Gait belt Activity Tolerance: Patient limited by pain Patient left: in chair;with chair alarm set Nurse Communication: Mobility status PT Visit Diagnosis: Unsteadiness on feet (R26.81);Pain;Other symptoms and signs involving the nervous system (R29.898) Pain - Right/Left:  (Lower Back to Right hip)     Time: 1610-9604 PT Time Calculation (min) (ACUTE ONLY): 21 min  Charges:  $Gait Training: 8-22 mins                     Christene Lye, SPT Acute Rehabilitation Services [phone] Secure chat preferred     Christene Lye 08/29/2022, 1:59 PM

## 2022-08-29 NOTE — Progress Notes (Signed)
[facility] PROGRESS NOTE    Progress Note  Kristopher [name]  UJW:[id number] DOB: 26-Jan-1971 DOA: 08/27/2022 PCP: Patient, No Pcp Per     Brief Narrative:   Kristopher [name] is an 52 y.o. male past medical history significant alcohol abuse, COPD,, essential hypertension comes in with leg and back pain, he relates he felt [place] after he was in a truck stop and he fell since then has been having radiating back pain, relates that his pain is radiating down his neck back and bilateral lower extremities.    Significant Events: MRI of the cervical lumbar and thoracic show small moderate right and mild left facet arthropathy L4-5 minimal normal foraminal stenosis.  Moderate right foraminal stenosis C5-C6, severe left foraminal stenosis.  Spine was unremarkable   Assessment/Plan:   Intractable back pain secondary to severe cervical spine stenosis: No saddle anesthesia MRI positive for C5-C6 stenosis. Neurosurgery was consulted recommended conservative management and no surgical intervention at this time. Pain control bowel regimen and PT OT. Recommended outpatient. Physical therapy evaluated the patient and awaiting evaluation  Essential hypertension: Question due to pain but he relates he has not been taking his medications at home. Started on Norvasc blood pressure is improved. Further titration as an outpatient.  COPD: Stable.  Hyponatremia: Resolved.  Obesity: Counseling.    DVT prophylaxis: lovenox Family Communication:none Status is: Inpatient Remains inpatient appropriate because: Acute low back pain    Code Status:     Code Status Orders  (From admission, onward)           Start     Ordered   08/27/22 2250  Full code  Continuous       Question:  By:  Answer:  Consent: discussion documented in EHR   08/27/22 2249           Code Status History     Date Active Date Inactive Code Status Order ID Comments User Context   02/10/2020 0112  02/13/2020 2127 Full Code [id number]  Therisa Doyne, MD Inpatient   01/20/2019 0535 01/22/2019 1110 Full Code [id number]  Hannah Beat, MD ED   01/06/2019 2247 01/10/2019 1710 Full Code [id number]  Jimmye Norman, NP ED         IV Access:   Peripheral IV   Procedures and diagnostic studies:   MR BRAIN WO CONTRAST  Result Date: 08/28/2022 CLINICAL DATA:  Acute neurologic deficit EXAM: MRI HEAD WITHOUT CONTRAST TECHNIQUE: Multiplanar, multiecho pulse sequences of the brain and surrounding structures were obtained without intravenous contrast. COMPARISON:  None Available. FINDINGS: Brain: No acute infarct, mass effect or extra-axial collection. No acute or chronic hemorrhage. Normal white matter signal, parenchymal volume and CSF spaces. The midline structures are normal. Vascular: Major flow voids are preserved. Skull and upper cervical spine: Normal calvarium and skull base. Visualized upper cervical spine and soft tissues are normal. Sinuses/Orbits:No paranasal sinus fluid levels or advanced mucosal thickening. No mastoid or middle ear effusion. Normal orbits. IMPRESSION: Normal brain MRI. Electronically Signed   By: Deatra Robinson M.D.   On: 08/28/2022 22:33   MR Cervical Spine Wo Contrast  Result Date: 08/27/2022 CLINICAL DATA:  Weakness EXAM: MRI CERVICAL, THORACIC AND LUMBAR SPINE WITHOUT CONTRAST TECHNIQUE: Multiplanar and multiecho pulse sequences of the cervical spine, to include the craniocervical junction and cervicothoracic junction, and thoracic and lumbar spine, were obtained without intravenous contrast. COMPARISON:  None Available. Cervical spine CT and chest CT 09/08/2019 FINDINGS: MRI CERVICAL SPINE FINDINGS Alignment: There is  slight reversal of the normal cervical curvature centered at C6 with trace grade 1 retrolisthesis of C6 on C7. There is no evidence of traumatic malalignment. Vertebrae: Vertebral body heights are preserved. Background marrow signal is normal.  There is benign intraosseous hemangioma in the C5 vertebral body. There is no suspicious marrow signal abnormality or marrow edema. Cord: Normal in signal and morphology. Posterior Fossa, vertebral arteries, paraspinal tissues: The imaged posterior fossa is unremarkable. The vertebral artery flow voids are normal. 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 The paraspinal soft tissues are otherwise unremarkable. Disc levels: C2-C3: No significant spinal canal or neural foraminal stenosis. C3-C4: There is mild uncovertebral ridging without significant spinal canal or neural foraminal stenosis C4-C5: No significant spinal canal or neural foraminal stenosis C5-C6: There is mild disc desiccation and narrowing with prominent right-sided uncovertebral ridging resulting moderate to severe right and no significant left neural foraminal stenosis and no significant spinal canal stenosis. C6-C7: There is mild disc desiccation and narrowing with left worse than right uncovertebral ridging resulting in moderate to severe left and moderate right neural foraminal stenosis without significant spinal canal stenosis C7-T1: No significant spinal canal or neural foraminal stenosis. MRI THORACIC SPINE FINDINGS Alignment:  Normal. Vertebrae: Vertebral body heights are preserved. Background marrow signal is normal. There is no suspicious marrow signal abnormality or marrow edema. Cord:  Normal in signal and morphology. Paraspinal and other soft tissues: Unremarkable. Disc levels: The disc heights are preserved. There is no significant disc herniation. There is no significant spinal canal or neural foraminal stenosis. MRI LUMBAR SPINE FINDINGS Segmentation: Standard; the lowest formed disc space is designated L5-S1. Alignment:  Normal. Vertebrae: Vertebral body heights are preserved. Background marrow signal is normal. There is no suspicious marrow  signal abnormality or marrow edema. There is a benign intraosseous hemangioma in the L5 vertebral body. Conus medullaris and cauda equina: Conus extends to the L1 level. Conus and cauda equina appear normal. Paraspinal and other soft tissues: Unremarkable. Disc levels: T12-L1: No significant spinal canal or neural foraminal stenosis L1-L2: No significant spinal canal or neural foraminal stenosis L2-L3: No significant spinal canal or neural foraminal stenosis. L3-L4: There is a left subarticular zone/foraminal protrusion and mild facet arthropathy with small effusions resulting in mild left and no significant right neural foraminal stenosis and no significant spinal canal stenosis. L4-L5: There is a small right foraminal protrusion and moderate right and mild left facet arthropathy resulting in mild right and no significant left neural foraminal stenosis and no significant spinal canal stenosis L5-S1: Mild facet arthropathy without significant spinal canal or neural foraminal stenosis. IMPRESSION: 1. 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 2. Unremarkable MRI of the thoracic spine with no significant degenerative change, and no significant spinal canal or neural foraminal stenosis. 3. Left subarticular zone/foraminal protrusion and mild facet arthropathy at L3-L4 resulting in mild left neural foraminal stenosis. 4. Small right foraminal protrusion and moderate right and mild left facet arthropathy at L4-L5 resulting in mild right neural foraminal stenosis. 5. No significant spinal canal stenosis at any level in the spine. Electronically Signed   By: Lesia Hausen M.D.   On: 08/27/2022 19:59   MR THORACIC SPINE WO CONTRAST  Result Date: 08/27/2022 CLINICAL DATA:  Weakness EXAM: MRI CERVICAL, THORACIC AND LUMBAR SPINE WITHOUT CONTRAST TECHNIQUE:  Multiplanar and multiecho pulse  sequences of the cervical spine, to include the craniocervical junction and cervicothoracic junction, and thoracic and lumbar spine, were obtained without intravenous contrast. COMPARISON:  None Available. Cervical spine CT and chest CT 09/08/2019 FINDINGS: MRI CERVICAL SPINE FINDINGS Alignment: There is slight reversal of the normal cervical curvature centered at C6 with trace grade 1 retrolisthesis of C6 on C7. There is no evidence of traumatic malalignment. Vertebrae: Vertebral body heights are preserved. Background marrow signal is normal. There is benign intraosseous hemangioma in the C5 vertebral body. There is no suspicious marrow signal abnormality or marrow edema. Cord: Normal in signal and morphology. Posterior Fossa, vertebral arteries, paraspinal tissues: The imaged posterior fossa is unremarkable. The vertebral artery flow voids are normal. 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 The paraspinal soft tissues are otherwise unremarkable. Disc levels: C2-C3: No significant spinal canal or neural foraminal stenosis. C3-C4: There is mild uncovertebral ridging without significant spinal canal or neural foraminal stenosis C4-C5: No significant spinal canal or neural foraminal stenosis C5-C6: There is mild disc desiccation and narrowing with prominent right-sided uncovertebral ridging resulting moderate to severe right and no significant left neural foraminal stenosis and no significant spinal canal stenosis. C6-C7: There is mild disc desiccation and narrowing with left worse than right uncovertebral ridging resulting in moderate to severe left and moderate right neural foraminal stenosis without significant spinal canal stenosis C7-T1: No significant spinal canal or neural foraminal stenosis. MRI THORACIC SPINE FINDINGS Alignment:  Normal. Vertebrae: Vertebral body heights are preserved. Background marrow signal is  normal. There is no suspicious marrow signal abnormality or marrow edema. Cord:  Normal in signal and morphology. Paraspinal and other soft tissues: Unremarkable. Disc levels: The disc heights are preserved. There is no significant disc herniation. There is no significant spinal canal or neural foraminal stenosis. MRI LUMBAR SPINE FINDINGS Segmentation: Standard; the lowest formed disc space is designated L5-S1. Alignment:  Normal. Vertebrae: Vertebral body heights are preserved. Background marrow signal is normal. There is no suspicious marrow signal abnormality or marrow edema. There is a benign intraosseous hemangioma in the L5 vertebral body. Conus medullaris and cauda equina: Conus extends to the L1 level. Conus and cauda equina appear normal. Paraspinal and other soft tissues: Unremarkable. Disc levels: T12-L1: No significant spinal canal or neural foraminal stenosis L1-L2: No significant spinal canal or neural foraminal stenosis L2-L3: No significant spinal canal or neural foraminal stenosis. L3-L4: There is a left subarticular zone/foraminal protrusion and mild facet arthropathy with small effusions resulting in mild left and no significant right neural foraminal stenosis and no significant spinal canal stenosis. L4-L5: There is a small right foraminal protrusion and moderate right and mild left facet arthropathy resulting in mild right and no significant left neural foraminal stenosis and no significant spinal canal stenosis L5-S1: Mild facet arthropathy without significant spinal canal or neural foraminal stenosis. IMPRESSION: 1. 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 2. Unremarkable MRI of the thoracic spine with no significant degenerative change, and no significant spinal canal or neural foraminal stenosis. 3. Left subarticular zone/foraminal protrusion and mild  facet arthropathy at L3-L4 resulting in mild left neural foraminal stenosis. 4. Small right foraminal protrusion and moderate right and mild left facet arthropathy at L4-L5 resulting in mild right neural foraminal stenosis. 5. No significant spinal  canal stenosis at any level in the spine. Electronically Signed   By: Lesia Hausen M.D.   On: 08/27/2022 19:59   MR LUMBAR SPINE WO CONTRAST  Result Date: 08/27/2022 CLINICAL DATA:  Weakness EXAM: MRI CERVICAL, THORACIC AND LUMBAR SPINE WITHOUT CONTRAST TECHNIQUE: Multiplanar and multiecho pulse sequences of the cervical spine, to include the craniocervical junction and cervicothoracic junction, and thoracic and lumbar spine, were obtained without intravenous contrast. COMPARISON:  None Available. Cervical spine CT and chest CT 09/08/2019 FINDINGS: MRI CERVICAL SPINE FINDINGS Alignment: There is slight reversal of the normal cervical curvature centered at C6 with trace grade 1 retrolisthesis of C6 on C7. There is no evidence of traumatic malalignment. Vertebrae: Vertebral body heights are preserved. Background marrow signal is normal. There is benign intraosseous hemangioma in the C5 vertebral body. There is no suspicious marrow signal abnormality or marrow edema. Cord: Normal in signal and morphology. Posterior Fossa, vertebral arteries, paraspinal tissues: The imaged posterior fossa is unremarkable. The vertebral artery flow voids are normal. 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 The paraspinal soft tissues are otherwise unremarkable. Disc levels: C2-C3: No significant spinal canal or neural foraminal stenosis. C3-C4: There is mild uncovertebral ridging without significant spinal canal or neural foraminal stenosis C4-C5: No significant spinal canal or neural foraminal stenosis C5-C6: There is mild disc desiccation and narrowing with prominent right-sided uncovertebral ridging  resulting moderate to severe right and no significant left neural foraminal stenosis and no significant spinal canal stenosis. C6-C7: There is mild disc desiccation and narrowing with left worse than right uncovertebral ridging resulting in moderate to severe left and moderate right neural foraminal stenosis without significant spinal canal stenosis C7-T1: No significant spinal canal or neural foraminal stenosis. MRI THORACIC SPINE FINDINGS Alignment:  Normal. Vertebrae: Vertebral body heights are preserved. Background marrow signal is normal. There is no suspicious marrow signal abnormality or marrow edema. Cord:  Normal in signal and morphology. Paraspinal and other soft tissues: Unremarkable. Disc levels: The disc heights are preserved. There is no significant disc herniation. There is no significant spinal canal or neural foraminal stenosis. MRI LUMBAR SPINE FINDINGS Segmentation: Standard; the lowest formed disc space is designated L5-S1. Alignment:  Normal. Vertebrae: Vertebral body heights are preserved. Background marrow signal is normal. There is no suspicious marrow signal abnormality or marrow edema. There is a benign intraosseous hemangioma in the L5 vertebral body. Conus medullaris and cauda equina: Conus extends to the L1 level. Conus and cauda equina appear normal. Paraspinal and other soft tissues: Unremarkable. Disc levels: T12-L1: No significant spinal canal or neural foraminal stenosis L1-L2: No significant spinal canal or neural foraminal stenosis L2-L3: No significant spinal canal or neural foraminal stenosis. L3-L4: There is a left subarticular zone/foraminal protrusion and mild facet arthropathy with small effusions resulting in mild left and no significant right neural foraminal stenosis and no significant spinal canal stenosis. L4-L5: There is a small right foraminal protrusion and moderate right and mild left facet arthropathy resulting in mild right and no significant left neural foraminal  stenosis and no significant spinal canal stenosis L5-S1: Mild facet arthropathy without significant spinal canal or neural foraminal stenosis. IMPRESSION: 1. 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 2. Unremarkable MRI of the thoracic spine with no significant degenerative change, and no significant spinal canal or neural  foraminal stenosis. 3. Left subarticular zone/foraminal protrusion and mild facet arthropathy at L3-L4 resulting in mild left neural foraminal stenosis. 4. Small right foraminal protrusion and moderate right and mild left facet arthropathy at L4-L5 resulting in mild right neural foraminal stenosis. 5. No significant spinal canal stenosis at any level in the spine. Electronically Signed   By: Lesia Hausen M.D.   On: 08/27/2022 19:59     Medical Consultants:   None.   Subjective:    Kristopher [name] Pain is controlled, last bowel movement was 2 days prior to admission.  Objective:    Vitals:   08/28/22 1552 08/28/22 2002 08/29/22 0405 08/29/22 0823  BP: 130/70 135/79 130/71 (!) 141/60  Pulse: 83 92 96 98  Resp: 18 20 20 17   Temp: 97.7 F (36.5 C) 97.9 F (36.6 C) 97.8 F (36.6 C) 97.6 F (36.4 C)  TempSrc: Oral Oral  Oral  SpO2: 98% 95% 97% 94%  Weight:      Height:       SpO2: 94 %   Intake/Output Summary (Last 24 hours) at 08/29/2022 0912 Last data filed at 08/29/2022 0824 Gross per 24 hour  Intake 240 ml  Output 500 ml  Net -260 ml   Filed Weights   08/27/22 1352  Weight: 117.9 kg    Exam: General exam: In no acute distress. Respiratory system: Good air movement and clear to auscultation. Cardiovascular system: S1 & S2 heard, RRR.  Gastrointestinal system: Abdomen is nondistended, soft and nontender.  Central nervous system: Alert and oriented. No focal neurological deficits. Psychiatry: Judgement and insight  appear normal. Mood & affect appropriate.    Data Reviewed:    Labs: Basic Metabolic Panel: Recent Labs  Lab 08/27/22 1633 08/28/22 0453 08/29/22 0436  NA 133* 133* 135  K 3.9 3.8 4.0  CL 100 99 99  CO2 24 26 27   GLUCOSE 142* 107* 179*  BUN 8 14 13   CREATININE 0.80 0.85 0.99  CALCIUM 9.0 9.0 9.1  MG 1.9 2.0 1.9  PHOS  --  4.3 4.3   GFR Estimated Creatinine Clearance: 115.2 mL/min (by C-G formula based on SCr of 0.99 mg/dL). Liver Function Tests: Recent Labs  Lab 08/27/22 1633 08/28/22 0453 08/29/22 0436  AST 26 31 29   ALT 28 34 34  ALKPHOS 48 52 51  BILITOT 0.7 0.6 0.6  PROT 7.4 6.7 6.6  ALBUMIN 3.9 3.8 3.6   No results for input(s): "LIPASE", "AMYLASE" in the last 168 hours. No results for input(s): "AMMONIA" in the last 168 hours. Coagulation profile No results for input(s): "INR", "PROTIME" in the last 168 hours. COVID-19 Labs  No results for input(s): "DDIMER", "FERRITIN", "LDH", "CRP" in the last 72 hours.  Lab Results  Component Value Date   SARSCOV2NAA NEGATIVE 02/09/2020   SARSCOV2NAA NEGATIVE 01/20/2019   SARSCOV2NAA NEGATIVE 01/06/2019    CBC: Recent Labs  Lab 08/27/22 1633 08/28/22 0453 08/29/22 0436  WBC 7.6 9.0 7.5  NEUTROABS 4.6 6.0 3.7  HGB 15.6 14.6 13.8  HCT 46.1 44.5 40.5  MCV 84.6 84.4 86.0  PLT 221 242 212   Cardiac Enzymes: No results for input(s): "CKTOTAL", "CKMB", "CKMBINDEX", "TROPONINI" in the last 168 hours. BNP (last 3 results) No results for input(s): "PROBNP" in the last 8760 hours. CBG: No results for input(s): "GLUCAP" in the last 168 hours. D-Dimer: No results for input(s): "DDIMER" in the last 72 hours. Hgb A1c: No results for input(s): "HGBA1C" in the last 72 hours. Lipid Profile:  No results for input(s): "CHOL", "HDL", "LDLCALC", "TRIG", "CHOLHDL", "LDLDIRECT" in the last 72 hours. Thyroid function studies: No results for input(s): "TSH", "T4TOTAL", "T3FREE", "THYROIDAB" in the last 72  hours.  Invalid input(s): "FREET3" Anemia work up: No results for input(s): "VITAMINB12", "FOLATE", "FERRITIN", "TIBC", "IRON", "RETICCTPCT" in the last 72 hours. Sepsis Labs: Recent Labs  Lab 08/27/22 1633 08/28/22 0453 08/29/22 0436  WBC 7.6 9.0 7.5   Microbiology No results found for this or any previous visit (from the past 240 hour(s)).   Medications:    amLODipine  5 mg Oral Daily   gabapentin  300 mg Oral TID   methocarbamol  750 mg Oral TID   Continuous Infusions:    LOS: 1 day   Marinda Elk  [facility]  08/29/2022, 9:12 AM

## 2022-08-29 NOTE — Progress Notes (Signed)
Inpatient Rehab Admissions Coordinator:    I spoke with rehab MD, who examined Pt. And reviewed his case. She does not recommend pursuing CIR admission, as Pt.'s primary issue appears to be related to pain, which could be managed during acute admission before he discharges. Additionally, I spoke with pt.'s cousin, Narda Amber  at [phone] who states that he is not sure that Pt. Can return home with him safely, as he and his wife are disabled and cannot provide care. I will let TOC know this, but CIR will sign off at this time.   Megan Salon, MS, CCC-SLP Rehab Admissions Coordinator  [phone] (celll) [phone] (office)

## 2022-08-30 DIAGNOSIS — R52 Pain, unspecified: Secondary | ICD-10-CM | POA: Diagnosis not present

## 2022-08-30 MED ORDER — TRAMADOL HCL 50 MG PO TABS
100.0000 mg | ORAL_TABLET | Freq: Four times a day (QID) | ORAL | Status: DC | PRN
  Administered 2022-08-31 (×2): 100 mg via ORAL
  Filled 2022-08-30 (×3): qty 2

## 2022-08-30 MED ORDER — OXYCODONE HCL 5 MG PO TABS
5.0000 mg | ORAL_TABLET | ORAL | Status: DC | PRN
  Administered 2022-08-30 – 2022-08-31 (×8): 5 mg via ORAL
  Filled 2022-08-30 (×8): qty 1

## 2022-08-30 MED ORDER — LISINOPRIL 5 MG PO TABS
5.0000 mg | ORAL_TABLET | Freq: Every day | ORAL | Status: DC
  Administered 2022-08-30 – 2022-08-31 (×2): 5 mg via ORAL
  Filled 2022-08-30 (×2): qty 1

## 2022-08-30 MED ORDER — AMLODIPINE BESYLATE 10 MG PO TABS
10.0000 mg | ORAL_TABLET | Freq: Every day | ORAL | Status: DC
  Administered 2022-08-30 – 2022-08-31 (×2): 10 mg via ORAL
  Filled 2022-08-30 (×2): qty 1

## 2022-08-30 NOTE — Evaluation (Signed)
Occupational Therapy Evaluation Patient Details Name: Kristopher [name] MRN: [id number] DOB: 26-Nov-1970 Today's Date: 08/30/2022   History of Present Illness Pt is a 52 y.o male presenting with neck and radiating lower back pain. PMH includes COPD, DM, HTN, Pancreatitis. 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   Clinical Impression   Pt admitted due to above diagnosis. Pt PLOF works as Naval architect, independent, lives with cousin and other family, sleeps on air mattress. Pt currently requires significant assistance for ADLs due to back pain, and due to decreased sensation to BLEs Pt not aware of foot placement during ambulation/transfers, increasing fall risk.  Pt has decreased FM/GM in BUEs due to significant back pain and some numbness off/on in B hands. Pt would benefit greatly from continued skilled therapy to improve functional independence and strength, post acute rehab follow up <3 hrs/day recommended.      Recommendations for follow up therapy are one component of a multi-disciplinary discharge planning process, led by the attending physician.  Recommendations may be updated based on patient status, additional functional criteria and insurance authorization.   Assistance Recommended at Discharge Frequent or constant Supervision/Assistance  Patient can return home with the following A little help with walking and/or transfers;A lot of help with bathing/dressing/bathroom;Assist for transportation;Help with stairs or ramp for entrance;Assistance with cooking/housework    Functional Status Assessment  Patient has had a recent decline in their functional status and demonstrates the ability to make significant improvements in function in a reasonable and predictable amount of time.   Equipment Recommendations   (defer)    Recommendations for Other Services       Precautions / Restrictions Precautions Precautions: Fall Restrictions Weight Bearing Restrictions: No      Mobility Bed Mobility Overal bed mobility: Needs Assistance Bed Mobility: Supine to Sit Rolling: Modified independent (Device/Increase time)         General bed mobility comments: HOB elevated from supine to sit, may need assistance scooting back in bed due to back pain.    Transfers Overall transfer level: Needs assistance Equipment used: Rolling walker (2 wheels) Transfers: Sit to/from Stand Sit to Stand: Min assist           General transfer comment: transfer from bed to recliner, min A, B foot drop, decreased awareness of foot placement      Balance Overall balance assessment: Needs assistance Sitting-balance support: Feet supported, No upper extremity supported Sitting balance-Leahy Scale: Good Sitting balance - Comments: EOB ADLs   Standing balance support: Bilateral upper extremity supported, During functional activity, Reliant on assistive device for balance Standing balance-Leahy Scale: Poor Standing balance comment: has LOB, decrease sensation to BLEs                           ADL either performed or assessed with clinical judgement   ADL Overall ADL's : Needs assistance/impaired Eating/Feeding: Independent   Grooming: Set up;Sitting   Upper Body Bathing: Minimal assistance;Sitting   Lower Body Bathing: Maximal assistance;Sitting/lateral leans   Upper Body Dressing : Sitting;Minimal assistance   Lower Body Dressing: Maximal assistance;Sit to/from stand   Toilet Transfer: Minimal assistance   Toileting- Clothing Manipulation and Hygiene: Minimal assistance;Sitting/lateral lean       Functional mobility during ADLs: Minimal assistance General ADL Comments:  Pt requires significant assistance for LB ADLs due to back pain, decreased shoulder ROM  due to back pain limiting UB ADLs     Vision Baseline Vision/History: 1 Wears glasses Ability to See in Adequate Light: 0 Adequate Patient Visual Report: No change from baseline       Perception     Praxis      Pertinent Vitals/Pain Pain Assessment Pain Assessment: 0-10 Pain Score: 7  Pain Location: Lower Back radiating to the right hip Pain Descriptors / Indicators: Burning, Constant, Guarding, Radiating, Squeezing Pain Intervention(s): Monitored during session     Hand Dominance     Extremity/Trunk Assessment Upper Extremity Assessment Upper Extremity Assessment: Generalized weakness           Communication Communication Communication: No difficulties   Cognition Arousal/Alertness: Awake/alert Behavior During Therapy: WFL for tasks assessed/performed Overall Cognitive Status: Within Functional Limits for tasks assessed                                       General Comments       Exercises     Shoulder Instructions      Home Living Family/patient expects to be discharged to:: Private residence Living Arrangements: Other relatives Available Help at Discharge: Family;Available 24 hours/day Type of Home: House Home Access: Ramped entrance     Home Layout: One level     Bathroom Shower/Tub: Chief Strategy Officer: Standard     Home Equipment: Agricultural consultant (2 wheels);Shower seat   Additional Comments: Living with first cousin, his wife, and their daughter. Pt reports he has been on an air mattress.      Prior Functioning/Environment Prior Level of Function : Independent/Modified Independent;Working/employed;Driving             Mobility Comments: Able to get in and out of his truck without pain or mobility limitations          OT Problem List: Decreased strength;Decreased range of motion;Decreased activity tolerance;Impaired balance (sitting and/or standing);Decreased coordination;Decreased safety  awareness;Pain;Impaired UE functional use      OT Treatment/Interventions: Therapeutic exercise;Self-care/ADL training;Energy conservation;DME and/or AE instruction;Therapeutic activities    OT Goals(Current goals can be found in the care plan section) Acute Rehab OT Goals Patient Stated 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 OT Goal Formulation: With patient Time For Goal Achievement: 09/13/22 Potential to Achieve Goals: Good  OT Frequency: Min 2X/week    Co-evaluation              AM-PAC OT "6 Clicks" Daily Activity     Outcome Measure Help from another person eating meals?: None Help from another person taking care of personal grooming?: A Little Help from another person toileting, which includes using toliet, bedpan, or urinal?: A Lot Help from another person bathing (including washing, rinsing, drying)?: A Lot Help from another person to put on and taking off regular upper body clothing?: A Little Help from another person to put on and taking off regular lower body clothing?: A Lot 6 Click Score: 16   End of Session Equipment Utilized During Treatment: Gait belt;Rolling walker (2 wheels) Nurse Communication: Mobility status  Activity Tolerance: Patient limited by pain Patient left: in chair;with call bell/phone within reach  OT Visit Diagnosis: Unsteadiness on feet (R26.81);Other abnormalities of gait and mobility (R26.89);History of falling (Z91.81);Muscle weakness (generalized) (M62.81);Pain Pain - part of body:  (  low back)                Time: 4098-1191 OT Time Calculation (min): 29 min Charges:  OT General Charges $OT Visit: 1 Visit OT Evaluation $OT Eval Moderate Complexity: 1 Mod OT Treatments $Self Care/Home Management : 8-22 mins  Morris, OTR/L   Alexis Goodell 08/30/2022, 1:44 PM

## 2022-08-30 NOTE — Progress Notes (Signed)
Physical Therapy Treatment Patient Details Name: Kristopher Kristopher [name] MRN: [id number] DOB: Sep 28, 1970 Today's Date: 08/30/2022   History of Present Illness Pt is a 52 y.o male presenting with neck and radiating lower back pain. PMH includes COPD, DM, HTN, Pancreatitis. 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    PT Comments    Pt progressing towards goals per plan of care. Progressed ambulation distance to sink without pt experiencing knee buckle. Still presents with significant balance impairments requiring min guard and RW support to remain upright. Gait pattern still presents with toe drag; cued for step through pattern, still unable to perform adequate foot clearance bilaterally. Discussed lower extremity nerve glides to improve pain symptoms. Patient will continue to benefit from skilled physical therapy in order to improve functional mobility and return to prior level of function. Anticipating post acute rehab < 3 hours a day.    Recommendations for follow up therapy are one component of a multi-disciplinary discharge planning process, led by the attending physician.  Recommendations may be updated based on patient status, additional functional criteria and insurance authorization.  Follow Up Recommendations  Can patient physically be transported by private vehicle: No    Assistance Recommended at Discharge Intermittent Supervision/Assistance  Patient can return home with the following A little help with walking and/or transfers;Assistance with cooking/housework;Assist for transportation;Help with stairs or ramp for entrance   Equipment Recommendations  None recommended by PT    Recommendations for Other Services       Precautions / Restrictions Precautions Precautions:  Fall Restrictions Weight Bearing Restrictions: No     Mobility  Bed Mobility               Kristopher [name] bed mobility comments: Pt received sitting upright in chair    Transfers Overall transfer level: Needs assistance Equipment used: Rolling walker (2 wheels) Transfers: Sit to/from Stand Sit to Stand: Min guard           Kristopher [name] transfer comment: Progressed to min guard with sit to stand transfer; cued for hand placement on RW and chair. Practice sit to stand technique x2 prior to gait training    Ambulation/Gait Ambulation/Gait assistance: Min assist Gait Distance (Feet): 20 Feet Assistive device: Rolling walker (2 wheels) Gait Pattern/deviations: Step-to pattern, Decreased step length - right, Decreased step length - left, Decreased stride length, Decreased dorsiflexion - left, Decreased dorsiflexion - right, Trunk flexed, Antalgic Gait velocity: Decreased Gait velocity interpretation: <1.31 ft/sec, indicative of household ambulator Pre-gait activities: Forward Steps with RW, min guard. Kristopher [name] Gait Details: Pt able to walk to sink back to recliner with two seated rest breaks. Continues to present with bilateral foot drop; cued to advance foot to step through pattern but still unable to demonstrate heel strike.   Stairs             Wheelchair Mobility    Modified Rankin (Stroke Patients Only)       Balance Overall balance assessment: Needs assistance Sitting-balance support: Feet supported, No upper extremity supported Sitting balance-Leahy Scale: Good     Standing balance support: Bilateral upper extremity supported, During functional activity, Reliant on assistive device for balance Standing balance-Leahy Scale: Poor Standing balance comment: Requires UE support through RW  Cognition Arousal/Alertness: Awake/alert Behavior During Therapy: WFL for tasks assessed/performed Overall Cognitive Status: Within  Functional Limits for tasks assessed                                          Exercises      Kristopher [name] Comments        Pertinent Vitals/Pain Pain Assessment Pain Assessment: 0-10 Pain Score: 7  Pain Location: Lower Back, Right Leg Pain Descriptors / Indicators: Burning, Constant, Radiating Pain Intervention(s): Limited activity within patient's tolerance, Monitored during session    Home Living Family/patient expects to be discharged to:: Private residence Living Arrangements: Other relatives Available Help at Discharge: Family;Available 24 hours/day Type of Home: House Home Access: Ramped entrance       Home Layout: One level Home Equipment: Agricultural consultant (2 wheels);Shower seat Additional Comments: Living with first cousin, his wife, and their daughter. Pt reports he has been on an air mattress.    Prior Function            PT Goals (current goals can now be found in the care plan section) Acute Rehab PT Goals Patient Stated Goal: Want to be able to walk and get in and out of truck with out pain PT Goal Formulation: With patient Time For Goal Achievement: 09/04/22 Potential to Achieve Goals: Fair Progress towards PT goals: Progressing toward goals    Frequency    Min 4X/week      PT Plan Current plan remains appropriate    Co-evaluation              AM-PAC PT "6 Clicks" Mobility   Outcome Measure  Help needed turning from your back to your side while in a flat bed without using bedrails?: A Little Help needed moving from lying on your back to sitting on the side of a flat bed without using bedrails?: A Little Help needed moving to and from a bed to a chair (including a wheelchair)?: A Little Help needed standing up from a chair using your arms (e.g., wheelchair or bedside chair)?: A Little Help needed to walk in hospital room?: A Lot Help needed climbing 3-5 steps with a railing? : Total 6 Click Score: 15    End of Session  Equipment Utilized During Treatment: Gait belt Activity Tolerance: Patient limited by pain Patient left: in chair;with call bell/phone within reach (Notified nursing staff about posey box for chair alarm, advised pt to remain in chair and request for nursing staff for mobilization.) Nurse Communication: Mobility status PT Visit Diagnosis: Unsteadiness on feet (R26.81);Pain;Other symptoms and signs involving the nervous system (R29.898) Pain - Right/Left:  (Lower Back to Right hip) Pain - part of body:  (radiculopathy down to feet)     Time: 1610-9604 PT Time Calculation (min) (ACUTE ONLY): 20 min  Charges:  $Gait Training: 8-22 mins                     Christene Lye, SPT Acute Rehabilitation Services [phone] Secure chat preferred     Christene Lye 08/30/2022, 4:58 PM

## 2022-08-30 NOTE — Progress Notes (Signed)
SNF search started. Will f/u with offers as available.   Dellie Burns, MSW, LCSW [phone] (coverage)

## 2022-08-30 NOTE — [place] FL2 (Signed)
[place] MEDICAID FL2 LEVEL OF CARE FORM     IDENTIFICATION  Patient Name: Kristopher [name] Birthdate: 1971/01/29 Sex: male Admission Date (Current Location): 08/27/2022  [facility] and [place] Number:  Producer, television/film/video and Address:  [facility], [address] [place], [place] [address]      Provider Number: [id number]  Attending Physician Name and Address:  Marinda Elk, MD  Relative Name and Phone Number:       Current Level of Care: Hospital Recommended Level of Care: Skilled Nursing Facility Prior Approval Number:    Date Approved/Denied:   PASRR Number: [id number]  Discharge Plan: SNF    Current Diagnoses: Patient Active Problem List   Diagnosis Date Noted   Essential hypertension 08/29/2022   COPD (chronic obstructive pulmonary disease) (HCC) 08/29/2022   Hyponatremia 08/29/2022   Intractable pain 08/27/2022   Tobacco abuse 02/10/2020   Acute on chronic pancreatitis (HCC) 02/09/2020   ETOH abuse 02/09/2020   Hemorrhoids 02/09/2020   Portal hypertension (HCC) 02/09/2020   Alcoholic cirrhosis of liver without ascites (HCC) 02/09/2020   Hypokalemia 02/09/2020   Abdominal pain 01/21/2019   Intractable nausea and vomiting 01/20/2019   Acute alcoholic pancreatitis 01/06/2019    Orientation RESPIRATION BLADDER Height & Weight     Self, Time, Situation, Place  Normal Continent Weight: 260 lb (117.9 kg) Height:  5\' 11"  (180.3 cm)  BEHAVIORAL SYMPTOMS/MOOD NEUROLOGICAL BOWEL NUTRITION STATUS      Continent    AMBULATORY STATUS COMMUNICATION OF NEEDS Skin   Limited Assist                           Personal Care Assistance Level of Assistance  Bathing, Feeding, Dressing Bathing Assistance: Limited assistance Feeding assistance: Limited assistance Dressing Assistance: Limited assistance     Functional Limitations Info  Sight, Hearing, Speech Sight Info: Adequate Hearing Info: Adequate Speech Info: Adequate     SPECIAL CARE FACTORS FREQUENCY  PT (By licensed PT), OT (By licensed OT)                    Contractures Contractures Info: Not present    Additional Factors Info  Code Status Code Status Info: FULL CODE             Current Medications (08/30/2022):  This is the current hospital active medication list Current Facility-Administered Medications  Medication Dose Route Frequency Provider Last Rate Last Admin   acetaminophen (TYLENOL) tablet 650 mg  650 mg Oral Q6H PRN Alan Mulder, MD   650 mg at 08/28/22 1217   Or   acetaminophen (TYLENOL) suppository 650 mg  650 mg Rectal Q6H PRN Alan Mulder, MD       albuterol (PROVENTIL) (2.5 MG/3ML) 0.083% nebulizer solution 3 mL  3 mL Inhalation Q6H PRN Sheikh, Omair Latif, DO       amLODipine (NORVASC) tablet 5 mg  5 mg Oral Daily Dorrell, Robert, MD   5 mg at 08/29/22 8469   gabapentin (NEURONTIN) capsule 300 mg  300 mg Oral TID Alan Mulder, MD   300 mg at 08/29/22 2139   lidocaine (LIDODERM) 5 % 1 patch  1 patch Transdermal Q24H John Giovanni, MD   1 patch at 08/29/22 2139   methocarbamol (ROBAXIN) tablet 750 mg  750 mg Oral TID Alan Mulder, MD   750 mg at 08/29/22 2139   naloxone [facility]) injection 0.4 mg  0.4 mg Intravenous PRN  John Giovanni, MD       oxyCODONE (Oxy IR/ROXICODONE) immediate release tablet 5 mg  5 mg Oral Q6H PRN John Giovanni, MD   5 mg at 08/30/22 0303   polyethylene glycol (MIRALAX / GLYCOLAX) packet 17 g  17 g Oral BID Marinda Elk, MD   17 g at 08/29/22 2140     Discharge Medications: Please see discharge summary for a list of discharge medications.  Relevant Imaging Results:  Relevant Lab Results:   Additional Information SS# [id number]  Dellie Burns [place], [place]

## 2022-08-30 NOTE — Progress Notes (Signed)
[facility] PROGRESS NOTE    Progress Note  Kristopher [name]  ZOX:[id number] DOB: May 16, 1970 DOA: 08/27/2022 PCP: Patient, No Pcp Per     Brief Narrative:   Kristopher [name] is an 52 y.o. male past medical history significant alcohol abuse, COPD,, essential hypertension comes in with leg and back pain, he relates he felt [place] after he was in a truck stop and he fell since then has been having radiating back pain, relates that his pain is radiating down his neck back and bilateral lower extremities.  Significant Events: MRI of the cervical lumbar and thoracic show small moderate right and mild left facet arthropathy L4-5 minimal normal foraminal stenosis.  Moderate right foraminal stenosis C5-C6, severe left foraminal stenosis.  Spine was unremarkable.  Awaiting skilled nursing facility placement   Assessment/Plan:   Intractable back pain secondary to severe cervical spine stenosis: No saddle anesthesia MRI positive for C5-C6 stenosis. Neurosurgery was consulted recommended conservative management and no surgical intervention at this time. Recommended outpatient. Physical therapy evaluated the patient, will need skilled nursing facility, awaiting skilled nursing facility placement. Continue to titrate down oral narcotics. Avoid IV narcotics.  Essential hypertension: Question due to pain but he relates he has not been taking his medications at home. Started on Norvasc blood pressure is improved. Further titration as an outpatient.  COPD: Stable.  Hyponatremia: Resolved.  Obesity: Counseling.    DVT prophylaxis: lovenox Family Communication:none Status is: Inpatient Remains inpatient appropriate because: Acute low back pain    Code Status:     Code Status Orders  (From admission, onward)           Start     Ordered   08/27/22 2250  Full code  Continuous       Question:  By:  Answer:  Consent: discussion documented in EHR   08/27/22 2249            Code Status History     Date Active Date Inactive Code Status Order ID Comments User Context   02/10/2020 0112 02/13/2020 2127 Full Code [id number]  Therisa Doyne, MD Inpatient   01/20/2019 0535 01/22/2019 1110 Full Code [id number]  Hannah Beat, MD ED   01/06/2019 2247 01/10/2019 1710 Full Code [id number]  Jimmye Norman, NP ED         IV Access:   Peripheral IV   Procedures and diagnostic studies:   MR BRAIN WO CONTRAST  Result Date: 08/28/2022 CLINICAL DATA:  Acute neurologic deficit EXAM: MRI HEAD WITHOUT CONTRAST TECHNIQUE: Multiplanar, multiecho pulse sequences of the brain and surrounding structures were obtained without intravenous contrast. COMPARISON:  None Available. FINDINGS: Brain: No acute infarct, mass effect or extra-axial collection. No acute or chronic hemorrhage. Normal white matter signal, parenchymal volume and CSF spaces. The midline structures are normal. Vascular: Major flow voids are preserved. Skull and upper cervical spine: Normal calvarium and skull base. Visualized upper cervical spine and soft tissues are normal. Sinuses/Orbits:No paranasal sinus fluid levels or advanced mucosal thickening. No mastoid or middle ear effusion. Normal orbits. IMPRESSION: Normal brain MRI. Electronically Signed   By: Deatra Robinson M.D.   On: 08/28/2022 22:33     Medical Consultants:   None.   Subjective:    Kristopher [name] has not had a bowel movement.  Objective:    Vitals:   08/29/22 1611 08/29/22 2006 08/30/22 0439 08/30/22 0811  BP: (!) 139/94 124/87 (!) 146/80 (!) 161/87  Pulse: 97 95 98 94  Resp: 18  20 20   Temp: 98.4 F (36.9 C) 98.1 F (36.7 C) 98.1 F (36.7 C) 98.8 F (37.1 C)  TempSrc: Oral  Oral Oral  SpO2: 97% 99% 98% 96%  Weight:      Height:       SpO2: 96 %   Intake/Output Summary (Last 24 hours) at 08/30/2022 0919 Last data filed at 08/29/2022 1208 Gross per 24 hour  Intake --  Output 500 ml  Net -500 ml     Filed Weights   08/27/22 1352  Weight: 117.9 kg    Exam: General exam: In no acute distress. Respiratory system: Good air movement and clear to auscultation. Cardiovascular system: S1 & S2 heard, RRR. No JVD. Gastrointestinal system: Abdomen is nondistended, soft and nontender.  Extremities: No pedal edema. Skin: No rashes, lesions or ulcers Psychiatry: Judgement and insight appear normal. Mood & affect appropriate.  Data Reviewed:    Labs: Basic Metabolic Panel: Recent Labs  Lab 08/27/22 1633 08/28/22 0453 08/29/22 0436  NA 133* 133* 135  K 3.9 3.8 4.0  CL 100 99 99  CO2 24 26 27   GLUCOSE 142* 107* 179*  BUN 8 14 13   CREATININE 0.80 0.85 0.99  CALCIUM 9.0 9.0 9.1  MG 1.9 2.0 1.9  PHOS  --  4.3 4.3    GFR Estimated Creatinine Clearance: 115.2 mL/min (by C-G formula based on SCr of 0.99 mg/dL). Liver Function Tests: Recent Labs  Lab 08/27/22 1633 08/28/22 0453 08/29/22 0436  AST 26 31 29   ALT 28 34 34  ALKPHOS 48 52 51  BILITOT 0.7 0.6 0.6  PROT 7.4 6.7 6.6  ALBUMIN 3.9 3.8 3.6    No results for input(s): "LIPASE", "AMYLASE" in the last 168 hours. No results for input(s): "AMMONIA" in the last 168 hours. Coagulation profile No results for input(s): "INR", "PROTIME" in the last 168 hours. COVID-19 Labs  No results for input(s): "DDIMER", "FERRITIN", "LDH", "CRP" in the last 72 hours.  Lab Results  Component Value Date   SARSCOV2NAA NEGATIVE 02/09/2020   SARSCOV2NAA NEGATIVE 01/20/2019   SARSCOV2NAA NEGATIVE 01/06/2019    CBC: Recent Labs  Lab 08/27/22 1633 08/28/22 0453 08/29/22 0436  WBC 7.6 9.0 7.5  NEUTROABS 4.6 6.0 3.7  HGB 15.6 14.6 13.8  HCT 46.1 44.5 40.5  MCV 84.6 84.4 86.0  PLT 221 242 212    Cardiac Enzymes: No results for input(s): "CKTOTAL", "CKMB", "CKMBINDEX", "TROPONINI" in the last 168 hours. BNP (last 3 results) No results for input(s): "PROBNP" in the last 8760 hours. CBG: No results for input(s): "GLUCAP" in  the last 168 hours. D-Dimer: No results for input(s): "DDIMER" in the last 72 hours. Hgb A1c: No results for input(s): "HGBA1C" in the last 72 hours. Lipid Profile: No results for input(s): "CHOL", "HDL", "LDLCALC", "TRIG", "CHOLHDL", "LDLDIRECT" in the last 72 hours. Thyroid function studies: No results for input(s): "TSH", "T4TOTAL", "T3FREE", "THYROIDAB" in the last 72 hours.  Invalid input(s): "FREET3" Anemia work up: No results for input(s): "VITAMINB12", "FOLATE", "FERRITIN", "TIBC", "IRON", "RETICCTPCT" in the last 72 hours. Sepsis Labs: Recent Labs  Lab 08/27/22 1633 08/28/22 0453 08/29/22 0436  WBC 7.6 9.0 7.5    Microbiology No results found for this or any previous visit (from the past 240 hour(s)).   Medications:    amLODipine  5 mg Oral Daily   gabapentin  300 mg Oral TID   lidocaine  1 patch Transdermal Q24H   methocarbamol  750 mg Oral TID   polyethylene  glycol  17 g Oral BID   Continuous Infusions:    LOS: 2 days   Marinda Elk  [facility]  08/30/2022, 9:19 AM

## 2022-08-31 ENCOUNTER — Other Ambulatory Visit (HOSPITAL_COMMUNITY): Payer: Self-pay

## 2022-08-31 DIAGNOSIS — R52 Pain, unspecified: Secondary | ICD-10-CM | POA: Diagnosis not present

## 2022-08-31 MED ORDER — GABAPENTIN 300 MG PO CAPS
300.0000 mg | ORAL_CAPSULE | Freq: Three times a day (TID) | ORAL | 0 refills | Status: DC
  Filled 2022-08-31: qty 90, 30d supply, fill #0

## 2022-08-31 MED ORDER — LISINOPRIL 5 MG PO TABS
5.0000 mg | ORAL_TABLET | Freq: Every day | ORAL | 0 refills | Status: DC
  Filled 2022-08-31: qty 30, 30d supply, fill #0

## 2022-08-31 MED ORDER — METHOCARBAMOL 750 MG PO TABS
750.0000 mg | ORAL_TABLET | Freq: Three times a day (TID) | ORAL | 0 refills | Status: DC
  Filled 2022-08-31: qty 20, 7d supply, fill #0

## 2022-08-31 MED ORDER — AMLODIPINE BESYLATE 10 MG PO TABS
10.0000 mg | ORAL_TABLET | Freq: Every day | ORAL | 0 refills | Status: DC
  Filled 2022-08-31: qty 30, 30d supply, fill #0

## 2022-08-31 MED ORDER — OXYCODONE HCL 5 MG PO TABS
5.0000 mg | ORAL_TABLET | Freq: Four times a day (QID) | ORAL | 0 refills | Status: AC | PRN
  Filled 2022-08-31: qty 30, 7d supply, fill #0

## 2022-08-31 NOTE — TOC Progression Note (Addendum)
Transition of Care [facility]) - Progression Note    Patient Details  Name: Kristopher [name] MRN: [id number] Date of Birth: 1970/10/18  Transition of [facility] CM/SW Contact  Dellie Burns [place], [place] Phone Number: 08/31/2022, 9:43 AM  Clinical Narrative: pt has [employer] health insurance which is a barrier to SNF placement as local facilities are not in network. Received bed offer from [place] with [facility] who is willing to get a single case agreement from [facility]. Pt with no other SNF options. Presented offer to pt who has refused and plans to dc home with family and HH. Pt's insurance may be a barrier to [facility] being arranged. Updated CM who will attempt to arrange HH/DME. SW available if pt becomes agreeable to SNF.   Dellie Burns, MSW, LCSW [phone] (coverage)        Expected Discharge Plan: IP Rehab Facility Barriers to Discharge: Continued Medical Work up  Expected Discharge Plan and Services   Discharge Planning Services: CM Consult Post Acute Care Choice: IP Rehab Living arrangements for the past 2 months: Single Family Home                                       Social Determinants of Health (SDOH) Interventions SDOH Screenings   Food Insecurity: No Food Insecurity (08/28/2022)  Housing: Medium Risk (08/28/2022)  Transportation Needs: No Transportation Needs (08/28/2022)  Utilities: Not At Risk (08/28/2022)  Tobacco Use: High Risk (08/27/2022)    Readmission Risk Interventions     No data to display

## 2022-08-31 NOTE — Progress Notes (Signed)
Patient discharged.  Removed PIV.  Reviewed discharge instructions, medications and follow up appts with patient.  Answered questions.  Gave patient copy of discharge instructions.   Prescriptions were filled in hospital.  Wheeled patient down to pharmacy, picked up meds and assisted patient into car.  No additional questions or concerns at this time.

## 2022-08-31 NOTE — Plan of Care (Signed)
  Problem: Education: Goal: Knowledge of General Education information will improve Description: Including pain rating scale, medication(s)/side effects and non-pharmacologic comfort measures Outcome: Progressing   Problem: Health Behavior/Discharge Planning: Goal: Ability to manage health-related needs will improve Outcome: Progressing   Problem: Clinical Measurements: Goal: Will remain free from infection Outcome: Progressing Goal: Diagnostic test results will improve Outcome: Progressing Goal: Respiratory complications will improve Outcome: Progressing Goal: Cardiovascular complication will be avoided Outcome: Progressing   Problem: Nutrition: Goal: Adequate nutrition will be maintained Outcome: Progressing   Problem: Elimination: Goal: Will not experience complications related to bowel motility Outcome: Progressing Goal: Will not experience complications related to urinary retention Outcome: Progressing   Problem: Safety: Goal: Ability to remain free from injury will improve Outcome: Progressing   Problem: Skin Integrity: Goal: Risk for impaired skin integrity will decrease Outcome: Progressing

## 2022-08-31 NOTE — Discharge Summary (Signed)
Physician Discharge Summary  Kristopher [name] ZOX:[id number] DOB: Jul 28, 1970 DOA: 08/27/2022  PCP: Patient, No Pcp Per  Admit date: 08/27/2022 Discharge date: 08/31/2022  Admitted From: Home Disposition: Home  Recommendations for Outpatient Follow-up:  Follow up with PCP as a scheduled Monitor blood pressure at home Recommend low-sodium diet Take pain medications only as needed for severe pain Follow-up with neurosurgery outpatient  Home Health: Yes Home health services arranged by [facility] Equipment/Devices: None Discharge Condition: Stable CODE STATUS: Full code Diet recommendation: Low-sodium diet  Brief/Interim Summary:  Kristopher [name] is an 52 y.o. male past medical history significant alcohol abuse, COPD,, essential hypertension comes in with leg and back pain, he relates he felt [place] after he was in a truck stop and he fell since then has been having radiating back pain, relates that his pain is radiating down his neck back and bilateral lower extremities.  MRI of the cervical lumbar and thoracic show small moderate right and mild left facet arthropathy L4-5 minimal normal foraminal stenosis.  Moderate right foraminal stenosis C5-C6, severe left foraminal stenosis.  Unremarkable MRI of thoracic spine with no significant degenerative changes.  Patient admitted for further evaluation for intractable neck and back pain.  Intractable back pain secondary to severe cervical spine stenosis: -No saddle anesthesia MRI positive for C5-C6 stenosis. -Neurosurgery was consulted recommended conservative management and no surgical intervention at this time. Recommended outpatient follow-up. -Physical therapy evaluated the patient, recommended skilled nursing facility however patient declined the offer -[facility] arranged home health services for the patient at discharge.   -Will discharge home on oxycodone, methocarbamol and gabapentin.  Arranged PCP follow-up on 09/13/2022.  Needs to follow-up with  neurosurgery outpatient   Essential hypertension: -His blood pressure noted to be elevated while in the hospital.  Could be due to pain?.  He started on amlodipine and lisinopril and will continue the same at discharge.  Recommend follow-up with PCP   COPD: -Remained stable on room air.  No wheezing noted on exam.  Resume home inhalers   Hyponatremia: Resolved.   Obesity with a BMI of 36: -Diet modification, exercise and weight loss recommended    Discharge Diagnoses:  Intractable pain secondary to severe cervical spine stenosis Essential hypertension COPD Hyponatremia Obesity with BMI of 36   Discharge Instructions  Discharge Instructions     Ambulatory referral to Physical Medicine Rehab   Complete by: As directed    Dr. Carlis Abbott saw in hospital consults   Diet - low sodium heart healthy   Complete by: As directed    Increase activity slowly   Complete by: As directed       Allergies as of 08/31/2022       Reactions   Ivp Dye [iodinated Contrast Media] Anaphylaxis        Medication List     TAKE these medications    albuterol 108 (90 Base) MCG/ACT inhaler Commonly known as: VENTOLIN HFA Inhale 1-2 puffs into the lungs every 6 (six) hours as needed for wheezing or shortness of breath.   amLODipine 10 MG tablet Commonly known as: NORVASC Take 1 tablet (10 mg total) by mouth daily. Start taking on: September 01, 2022   benzonatate 100 MG capsule Commonly known as: TESSALON Take 1 capsule (100 mg total) by mouth 3 (three) times daily as needed for cough.   gabapentin 300 MG capsule Commonly known as: NEURONTIN Take 1 capsule (300 mg total) by mouth 3 (three) times daily.   lisinopril 5 MG tablet  Commonly known as: ZESTRIL Take 1 tablet (5 mg total) by mouth daily. Start taking on: September 01, 2022   methocarbamol 750 MG tablet Commonly known as: ROBAXIN Take 1 tablet (750 mg total) by mouth 3 (three) times daily.   multivitamin with minerals Tabs  tablet Take 1 tablet by mouth daily.   oxyCODONE 5 MG immediate release tablet Commonly known as: Oxy IR/ROXICODONE Take 1 tablet (5 mg total) by mouth every 6 (six) hours as needed for up to 10 days for moderate pain.        Follow-up Information     [facility] Follow up on 09/13/2022.   Why: Your appointment is at 10:45 am. Please arrive early and bring a picture ID, insurance card and current medications. Contact information: [address] [place] [address] [phone]        [facility] Follow up.   Why: [facility]  the home health agency will contact you for the first home visit Contact information: [address] [place] [address] [phone]                Allergies  Allergen Reactions   Ivp Dye [Iodinated Contrast Media] Anaphylaxis    Consultations: Neurosurgery   Procedures/Studies: MR BRAIN WO CONTRAST  Result Date: 08/28/2022 CLINICAL DATA:  Acute neurologic deficit EXAM: MRI HEAD WITHOUT CONTRAST TECHNIQUE: Multiplanar, multiecho pulse sequences of the brain and surrounding structures were obtained without intravenous contrast. COMPARISON:  None Available. FINDINGS: Brain: No acute infarct, mass effect or extra-axial collection. No acute or chronic hemorrhage. Normal white matter signal, parenchymal volume and CSF spaces. The midline structures are normal. Vascular: Major flow voids are preserved. Skull and upper cervical spine: Normal calvarium and skull base. Visualized upper cervical spine and soft tissues are normal. Sinuses/Orbits:No paranasal sinus fluid levels or advanced mucosal thickening. No mastoid or middle ear effusion. Normal orbits. IMPRESSION: Normal brain MRI. Electronically Signed   By: Deatra Robinson M.D.   On: 08/28/2022 22:33   MR Cervical Spine Wo Contrast  Result Date: 08/27/2022 CLINICAL DATA:  Weakness EXAM: MRI CERVICAL, THORACIC AND LUMBAR  SPINE WITHOUT CONTRAST TECHNIQUE: Multiplanar and multiecho pulse sequences of the cervical spine, to include the craniocervical junction and cervicothoracic junction, and thoracic and lumbar spine, were obtained without intravenous contrast. COMPARISON:  None Available. Cervical spine CT and chest CT 09/08/2019 FINDINGS: MRI CERVICAL SPINE FINDINGS Alignment: There is slight reversal of the normal cervical curvature centered at C6 with trace grade 1 retrolisthesis of C6 on C7. There is no evidence of traumatic malalignment. Vertebrae: Vertebral body heights are preserved. Background marrow signal is normal. There is benign intraosseous hemangioma in the C5 vertebral body. There is no suspicious marrow signal abnormality or marrow edema. Cord: Normal in signal and morphology. Posterior Fossa, vertebral arteries, paraspinal tissues: The imaged posterior fossa is unremarkable. The vertebral artery flow voids are normal. 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 The paraspinal soft tissues are otherwise unremarkable. Disc levels: C2-C3: No significant spinal canal or neural foraminal stenosis. C3-C4: There is mild uncovertebral ridging without significant spinal canal or neural foraminal stenosis C4-C5: No significant spinal canal or neural foraminal stenosis C5-C6: There is mild disc desiccation and narrowing with prominent right-sided uncovertebral ridging resulting moderate to severe right and no significant left neural foraminal stenosis and no significant spinal canal stenosis. C6-C7:  There is mild disc desiccation and narrowing with left worse than right uncovertebral ridging resulting in moderate to severe left and moderate right neural foraminal stenosis without significant spinal canal stenosis C7-T1: No significant spinal canal or neural foraminal stenosis. MRI THORACIC SPINE FINDINGS Alignment:  Normal. Vertebrae:  Vertebral body heights are preserved. Background marrow signal is normal. There is no suspicious marrow signal abnormality or marrow edema. Cord:  Normal in signal and morphology. Paraspinal and other soft tissues: Unremarkable. Disc levels: The disc heights are preserved. There is no significant disc herniation. There is no significant spinal canal or neural foraminal stenosis. MRI LUMBAR SPINE FINDINGS Segmentation: Standard; the lowest formed disc space is designated L5-S1. Alignment:  Normal. Vertebrae: Vertebral body heights are preserved. Background marrow signal is normal. There is no suspicious marrow signal abnormality or marrow edema. There is a benign intraosseous hemangioma in the L5 vertebral body. Conus medullaris and cauda equina: Conus extends to the L1 level. Conus and cauda equina appear normal. Paraspinal and other soft tissues: Unremarkable. Disc levels: T12-L1: No significant spinal canal or neural foraminal stenosis L1-L2: No significant spinal canal or neural foraminal stenosis L2-L3: No significant spinal canal or neural foraminal stenosis. L3-L4: There is a left subarticular zone/foraminal protrusion and mild facet arthropathy with small effusions resulting in mild left and no significant right neural foraminal stenosis and no significant spinal canal stenosis. L4-L5: There is a small right foraminal protrusion and moderate right and mild left facet arthropathy resulting in mild right and no significant left neural foraminal stenosis and no significant spinal canal stenosis L5-S1: Mild facet arthropathy without significant spinal canal or neural foraminal stenosis. IMPRESSION: 1. 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 2. Unremarkable MRI of the thoracic spine with no significant degenerative change, and no significant spinal canal or neural foraminal  stenosis. 3. Left subarticular zone/foraminal protrusion and mild facet arthropathy at L3-L4 resulting in mild left neural foraminal stenosis. 4. Small right foraminal protrusion and moderate right and mild left facet arthropathy at L4-L5 resulting in mild right neural foraminal stenosis. 5. No significant spinal canal stenosis at any level in the spine. Electronically Signed   By: Lesia Hausen M.D.   On: 08/27/2022 19:59   MR THORACIC SPINE WO CONTRAST  Result Date: 08/27/2022 CLINICAL DATA:  Weakness EXAM: MRI CERVICAL, THORACIC AND LUMBAR SPINE WITHOUT CONTRAST TECHNIQUE: Multiplanar and multiecho pulse sequences of the cervical spine, to include the craniocervical junction and cervicothoracic junction, and thoracic and lumbar spine, were obtained without intravenous contrast. COMPARISON:  None Available. Cervical spine CT and chest CT 09/08/2019 FINDINGS: MRI CERVICAL SPINE FINDINGS Alignment: There is slight reversal of the normal cervical curvature centered at C6 with trace grade 1 retrolisthesis of C6 on C7. There is no evidence of traumatic malalignment. Vertebrae: Vertebral body heights are preserved. Background marrow signal is normal. There is benign intraosseous hemangioma in the C5 vertebral body. There is no suspicious marrow signal abnormality or marrow edema. Cord: Normal in signal and morphology. Posterior Fossa, vertebral arteries, paraspinal tissues: The imaged posterior fossa is unremarkable. The vertebral artery flow voids are normal. 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 The paraspinal soft tissues are otherwise unremarkable. Disc levels: C2-C3: No significant spinal canal or neural foraminal stenosis. C3-C4: There is mild uncovertebral ridging without significant spinal  canal or neural foraminal stenosis C4-C5: No significant spinal canal or neural foraminal stenosis C5-C6: There is mild disc  desiccation and narrowing with prominent right-sided uncovertebral ridging resulting moderate to severe right and no significant left neural foraminal stenosis and no significant spinal canal stenosis. C6-C7: There is mild disc desiccation and narrowing with left worse than right uncovertebral ridging resulting in moderate to severe left and moderate right neural foraminal stenosis without significant spinal canal stenosis C7-T1: No significant spinal canal or neural foraminal stenosis. MRI THORACIC SPINE FINDINGS Alignment:  Normal. Vertebrae: Vertebral body heights are preserved. Background marrow signal is normal. There is no suspicious marrow signal abnormality or marrow edema. Cord:  Normal in signal and morphology. Paraspinal and other soft tissues: Unremarkable. Disc levels: The disc heights are preserved. There is no significant disc herniation. There is no significant spinal canal or neural foraminal stenosis. MRI LUMBAR SPINE FINDINGS Segmentation: Standard; the lowest formed disc space is designated L5-S1. Alignment:  Normal. Vertebrae: Vertebral body heights are preserved. Background marrow signal is normal. There is no suspicious marrow signal abnormality or marrow edema. There is a benign intraosseous hemangioma in the L5 vertebral body. Conus medullaris and cauda equina: Conus extends to the L1 level. Conus and cauda equina appear normal. Paraspinal and other soft tissues: Unremarkable. Disc levels: T12-L1: No significant spinal canal or neural foraminal stenosis L1-L2: No significant spinal canal or neural foraminal stenosis L2-L3: No significant spinal canal or neural foraminal stenosis. L3-L4: There is a left subarticular zone/foraminal protrusion and mild facet arthropathy with small effusions resulting in mild left and no significant right neural foraminal stenosis and no significant spinal canal stenosis. L4-L5: There is a small right foraminal protrusion and moderate right and mild left facet  arthropathy resulting in mild right and no significant left neural foraminal stenosis and no significant spinal canal stenosis L5-S1: Mild facet arthropathy without significant spinal canal or neural foraminal stenosis. IMPRESSION: 1. 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 2. Unremarkable MRI of the thoracic spine with no significant degenerative change, and no significant spinal canal or neural foraminal stenosis. 3. Left subarticular zone/foraminal protrusion and mild facet arthropathy at L3-L4 resulting in mild left neural foraminal stenosis. 4. Small right foraminal protrusion and moderate right and mild left facet arthropathy at L4-L5 resulting in mild right neural foraminal stenosis. 5. No significant spinal canal stenosis at any level in the spine. Electronically Signed   By: Lesia Hausen M.D.   On: 08/27/2022 19:59   MR LUMBAR SPINE WO CONTRAST  Result Date: 08/27/2022 CLINICAL DATA:  Weakness EXAM: MRI CERVICAL, THORACIC AND LUMBAR SPINE WITHOUT CONTRAST TECHNIQUE: Multiplanar and multiecho pulse sequences of the cervical spine, to include the craniocervical junction and cervicothoracic junction, and thoracic and lumbar spine, were obtained without intravenous contrast. COMPARISON:  None Available. Cervical spine CT and chest CT 09/08/2019 FINDINGS: MRI CERVICAL SPINE FINDINGS Alignment: There is slight reversal of the normal cervical curvature centered at C6 with trace grade 1 retrolisthesis of C6 on C7. There is no evidence of traumatic malalignment. Vertebrae: Vertebral body heights are preserved. Background marrow signal is normal. There is benign intraosseous hemangioma in the C5 vertebral body. There is no suspicious marrow signal abnormality or marrow edema. Cord: Normal in signal and morphology. Posterior Fossa, vertebral arteries, paraspinal tissues: The  imaged posterior fossa is unremarkable. The vertebral artery flow voids are normal.  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 The paraspinal soft tissues are otherwise unremarkable. Disc levels: C2-C3: No significant spinal canal or neural foraminal stenosis. C3-C4: There is mild uncovertebral ridging without significant spinal canal or neural foraminal stenosis C4-C5: No significant spinal canal or neural foraminal stenosis C5-C6: There is mild disc desiccation and narrowing with prominent right-sided uncovertebral ridging resulting moderate to severe right and no significant left neural foraminal stenosis and no significant spinal canal stenosis. C6-C7: There is mild disc desiccation and narrowing with left worse than right uncovertebral ridging resulting in moderate to severe left and moderate right neural foraminal stenosis without significant spinal canal stenosis C7-T1: No significant spinal canal or neural foraminal stenosis. MRI THORACIC SPINE FINDINGS Alignment:  Normal. Vertebrae: Vertebral body heights are preserved. Background marrow signal is normal. There is no suspicious marrow signal abnormality or marrow edema. Cord:  Normal in signal and morphology. Paraspinal and other soft tissues: Unremarkable. Disc levels: The disc heights are preserved. There is no significant disc herniation. There is no significant spinal canal or neural foraminal stenosis. MRI LUMBAR SPINE FINDINGS Segmentation: Standard; the lowest formed disc space is designated L5-S1. Alignment:  Normal. Vertebrae: Vertebral body heights are preserved. Background marrow signal is normal. There is no suspicious marrow signal abnormality or marrow edema. There is a benign intraosseous hemangioma in the L5 vertebral body. Conus medullaris and cauda equina: Conus extends to the L1 level. Conus and cauda equina appear normal. Paraspinal and other soft  tissues: Unremarkable. Disc levels: T12-L1: No significant spinal canal or neural foraminal stenosis L1-L2: No significant spinal canal or neural foraminal stenosis L2-L3: No significant spinal canal or neural foraminal stenosis. L3-L4: There is a left subarticular zone/foraminal protrusion and mild facet arthropathy with small effusions resulting in mild left and no significant right neural foraminal stenosis and no significant spinal canal stenosis. L4-L5: There is a small right foraminal protrusion and moderate right and mild left facet arthropathy resulting in mild right and no significant left neural foraminal stenosis and no significant spinal canal stenosis L5-S1: Mild facet arthropathy without significant spinal canal or neural foraminal stenosis. IMPRESSION: 1. 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 2. Unremarkable MRI of the thoracic spine with no significant degenerative change, and no significant spinal canal or neural foraminal stenosis. 3. Left subarticular zone/foraminal protrusion and mild facet arthropathy at L3-L4 resulting in mild left neural foraminal stenosis. 4. Small right foraminal protrusion and moderate right and mild left facet arthropathy at L4-L5 resulting in mild right neural foraminal stenosis. 5. No significant spinal canal stenosis at any level in the spine. Electronically Signed   By: Lesia Hausen M.D.   On: 08/27/2022 19:59      Subjective: Patient seen and examined.  Reports some pain but overall improved.  No acute events overnight.  He declined SNF offer and wishes to go home with home health services.  Discharge Exam: Vitals:   08/31/22 0542 08/31/22 0802  BP: 130/70 (!) 151/94  Pulse: 78 86  Resp: 20 16  Temp: 97.8 F (36.6 C) 97.7 F (36.5 C)  SpO2: 97% 96%   Vitals:   08/30/22 1609 08/30/22 2008 08/31/22 0542  08/31/22 0802  BP: (!) 142/82 139/78 130/70 (!) 151/94  Pulse: 93 95 78 86  Resp: 19 20 20  16  Temp: 98.3 F (36.8 C) 98.7 F (37.1 C) 97.8 F (36.6 C) 97.7 F (36.5 C)  TempSrc: Oral  Oral Oral  SpO2: 96% 97% 97% 96%  Weight:      Height:        General: Pt is alert, awake, not in acute distress, on room air communicating well, obese Cardiovascular: RRR, S1/S2 +, no rubs, no gallops Respiratory: CTA bilaterally, no wheezing, no rhonchi Abdominal: Soft, NT, ND, bowel sounds + Extremities: no edema, no cyanosis    The results of significant diagnostics from this hospitalization (including imaging, microbiology, ancillary and laboratory) are listed below for reference.     Microbiology: No results found for this or any previous visit (from the past 240 hour(s)).   Labs: BNP (last 3 results) No results for input(s): "BNP" in the last 8760 hours. Basic Metabolic Panel: Recent Labs  Lab 08/27/22 1633 08/28/22 0453 08/29/22 0436  NA 133* 133* 135  K 3.9 3.8 4.0  CL 100 99 99  CO2 24 26 27   GLUCOSE 142* 107* 179*  BUN 8 14 13   CREATININE 0.80 0.85 0.99  CALCIUM 9.0 9.0 9.1  MG 1.9 2.0 1.9  PHOS  --  4.3 4.3   Liver Function Tests: Recent Labs  Lab 08/27/22 1633 08/28/22 0453 08/29/22 0436  AST 26 31 29   ALT 28 34 34  ALKPHOS 48 52 51  BILITOT 0.7 0.6 0.6  PROT 7.4 6.7 6.6  ALBUMIN 3.9 3.8 3.6   No results for input(s): "LIPASE", "AMYLASE" in the last 168 hours. No results for input(s): "AMMONIA" in the last 168 hours. CBC: Recent Labs  Lab 08/27/22 1633 08/28/22 0453 08/29/22 0436  WBC 7.6 9.0 7.5  NEUTROABS 4.6 6.0 3.7  HGB 15.6 14.6 13.8  HCT 46.1 44.5 40.5  MCV 84.6 84.4 86.0  PLT 221 242 212   Cardiac Enzymes: No results for input(s): "CKTOTAL", "CKMB", "CKMBINDEX", "TROPONINI" in the last 168 hours. BNP: Invalid input(s): "POCBNP" CBG: No results for input(s): "GLUCAP" in the last 168 hours. D-Dimer No results for input(s): "DDIMER" in  the last 72 hours. Hgb A1c No results for input(s): "HGBA1C" in the last 72 hours. Lipid Profile No results for input(s): "CHOL", "HDL", "LDLCALC", "TRIG", "CHOLHDL", "LDLDIRECT" in the last 72 hours. Thyroid function studies No results for input(s): "TSH", "T4TOTAL", "T3FREE", "THYROIDAB" in the last 72 hours.  Invalid input(s): "FREET3" Anemia work up No results for input(s): "VITAMINB12", "FOLATE", "FERRITIN", "TIBC", "IRON", "RETICCTPCT" in the last 72 hours. Urinalysis    Component Value Date/Time   COLORURINE YELLOW 08/27/2022 1805   APPEARANCEUR CLEAR 08/27/2022 1805   LABSPEC 1.011 08/27/2022 1805   PHURINE 5.0 08/27/2022 1805   GLUCOSEU NEGATIVE 08/27/2022 1805   HGBUR NEGATIVE 08/27/2022 1805   BILIRUBINUR NEGATIVE 08/27/2022 1805   KETONESUR NEGATIVE 08/27/2022 1805   PROTEINUR NEGATIVE 08/27/2022 1805   NITRITE NEGATIVE 08/27/2022 1805   LEUKOCYTESUR NEGATIVE 08/27/2022 1805   Sepsis Labs Recent Labs  Lab 08/27/22 1633 08/28/22 0453 08/29/22 0436  WBC 7.6 9.0 7.5   Microbiology No results found for this or any previous visit (from the past 240 hour(s)).   Time coordinating discharge: Over 30 minutes  SIGNED:   Ollen Bowl, MD  [facility] 08/31/2022, 2:03 PM Pager   If 7PM-7AM, please contact night-coverage [email or web address]

## 2022-08-31 NOTE — Progress Notes (Signed)
Physical Therapy Treatment Patient Details Name: Kristopher [name] MRN: [id number] DOB: 1970-12-10 Today's Date: 08/31/2022   History of Present Illness Pt is a 52 y.o male presenting with neck and radiating lower back pain. PMH includes COPD, DM, HTN, Pancreatitis. 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    PT Comments    Pt progressing towards goals per plan of care. Progressed transfer from EOB to standing without physical assistance and RW support. Pt able to maintain standing balance with hand hover support over RW but required physical assistance with terminal knee extension. Progressed ambulation throughout the room without physical assistance but continues to present with bilateral toe drag. Improvement with pain noted following nerve glide technique in supine. Educated on seated nerve glide techniques and AAROM with towel in supine. Pt will continue to benefit with skilled physical therapy in order to facilitate return to prior level of function.     Recommendations for follow up therapy are one component of a multi-disciplinary discharge planning process, led by the attending physician.  Recommendations may be updated based on patient status, additional functional criteria and insurance authorization.  Follow Up Recommendations  Can patient physically be transported by private vehicle: No    Assistance Recommended at Discharge Intermittent Supervision/Assistance  Patient can return home with the following A little help with walking and/or transfers;Assistance with cooking/housework;Assist for transportation;Help with stairs or ramp for entrance   Equipment Recommendations  None recommended by PT    Recommendations for Other Services       Precautions / Restrictions  Precautions Precautions: Fall Restrictions Weight Bearing Restrictions: No     Mobility  Bed Mobility Overal bed mobility: Needs Assistance Bed Mobility: Rolling, Sidelying to Sit Rolling: Modified independent (Device/Increase time) Sidelying to sit: Supervision       General bed mobility comments: HOB lowered to simulate home set up. Able to perform bed mobility without physical assistance.    Transfers Overall transfer level: Needs assistance Equipment used: Rolling walker (2 wheels) Transfers: Sit to/from Stand Sit to Stand: Min guard           General transfer comment: Pt able to perform sit to stand transfer without physical assistance and RW support.    Ambulation/Gait Ambulation/Gait assistance: Min guard Gait Distance (Feet): 20 Feet Assistive device: Rolling walker (2 wheels) Gait Pattern/deviations: Step-to pattern, Decreased step length - right, Decreased step length - left, Decreased stride length, Decreased dorsiflexion - left, Decreased dorsiflexion - right, Trunk flexed, Antalgic Gait velocity: Decreased Gait velocity interpretation: <1.31 ft/sec, indicative of household ambulator   General Gait Details: Pt progressed with amb throughout room to chair at min guard but still presents with toe drag bilaterally and heavy reliance on RW for support.   Stairs             Wheelchair Mobility    Modified Rankin (Stroke Patients Only)       Balance Overall balance assessment: Needs assistance Sitting-balance support: Feet supported, No upper extremity supported Sitting balance-Leahy Scale: Good     Standing balance support: Reliant on assistive device for balance, Bilateral upper extremity supported (Trialed balance with Hand Hover Support over RW.) Standing balance-Leahy Scale: Poor Standing balance comment: Able to progress standing balance to hand hover over RW but required physical assistance to achieve and maintain terminal knee  extension.                            Cognition Arousal/Alertness: Awake/alert Behavior During Therapy: WFL for tasks assessed/performed Overall Cognitive Status: Within Functional Limits for tasks assessed                                          Exercises General Exercises - Lower Extremity Long Arc Quad:  (8 reps) Heel Slides: Supine, AAROM, 5 reps, Both Other Exercises Other Exercises: Nerve Glides Bilaterally, AAROM, pt able to tolerate movement; pain improved with floss technique. Inconsistent dorsiflexion AROM noted with both legs.    General Comments General comments (skin integrity, edema, etc.): Pt adamant about transferring to the shower without assistance. Educated on safety and fall precautions.      Pertinent Vitals/Pain Pain Assessment Pain Assessment: 0-10 Pain Score: 6  Pain Location: Lower Back, Right Leg Pain Descriptors / Indicators: Burning, Constant, Radiating Pain Intervention(s): Limited activity within patient's tolerance, Monitored during session    Home Living                          Prior Function            PT Goals (current goals can now be found in the care plan section) Acute Rehab PT Goals Patient Stated Goal: Want to be able to walk and get in and out of truck with out pain PT Goal Formulation: With patient Time For Goal Achievement: 09/04/22 Potential to Achieve Goals: Fair Progress towards PT goals: Progressing toward goals    Frequency    Min 4X/week      PT Plan Current plan remains appropriate    Co-evaluation              AM-PAC PT "6 Clicks" Mobility   Outcome Measure  Help needed turning from your back to your side while in a flat bed without using bedrails?: A Little Help needed moving from lying on your back to sitting on the side of a flat bed without using bedrails?: A Little Help needed moving to and from a bed to a chair (including a wheelchair)?: A Little Help  needed standing up from a chair using your arms (e.g., wheelchair or bedside chair)?: A Little Help needed to walk in hospital room?: A Lot Help needed climbing 3-5 steps with a railing? : Total 6 Click Score: 15    End of Session Equipment Utilized During Treatment: Gait belt Activity Tolerance: Patient tolerated treatment well Patient left: in chair;with call bell/phone within reach Nurse Communication: Mobility status PT Visit Diagnosis: Unsteadiness on feet (R26.81);Pain;Other symptoms and signs involving the nervous system (R29.898) Pain - Right/Left:  (Lower Back to Right hip) Pain - part of body:  (radiculopathy down to feet)     Time: 1200-1240 PT Time Calculation (min) (ACUTE ONLY): 40 min  Charges:  $Gait Training: 8-22 mins $Therapeutic Exercise: 23-37 mins                     Christene Lye, SPT Acute Rehabilitation Services [phone] Secure chat preferred     Christene Lye 08/31/2022, 3:49 PM

## 2022-08-31 NOTE — TOC Transition Note (Signed)
Transition of Care [facility]) - CM/SW Discharge Note   Patient Details  Name: Kristopher [name] MRN: [id number] Date of Birth: Jul 26, 1970  Transition of Care [facility]) CM/SW Contact:  Kermit Balo, RN Phone Number: 08/31/2022, 2:24 PM   Clinical Narrative:    Pt is discharging home with his cousins. Pt has refused SNF but agreeable to home health services. Cm was able to arrange [facility] with [facility]. Information on the AVS. [facility] will contact him for the first home visit.  No new DME needs.  Medications for home to be delivered to the room per [facility] pharmacy.  Pt has transportation home.    Final next level of care: Home w Home Health Services Barriers to Discharge: No Barriers Identified   Patient Goals and CMS Choice CMS Medicare.gov Compare Post Acute Care list provided to:: Patient Choice offered to / list presented to : Patient  Discharge Placement                         Discharge Plan and Services Additional resources added to the After Visit Summary for     Discharge Planning Services: CM Consult Post Acute Care Choice: IP Rehab                    HH Arranged: PT, OT HH Agency: [facility] Date [facility] Agency Contacted: 08/31/22   Representative spoke with at [facility] Agency: Amy  Social Determinants of Health (SDOH) Interventions SDOH Screenings   Food Insecurity: No Food Insecurity (08/28/2022)  Housing: Medium Risk (08/28/2022)  Transportation Needs: No Transportation Needs (08/28/2022)  Utilities: Not At Risk (08/28/2022)  Tobacco Use: High Risk (08/27/2022)     Readmission Risk Interventions     No data to display

## 2022-09-13 ENCOUNTER — Ambulatory Visit: Payer: 59 | Admitting: Student

## 2022-12-12 ENCOUNTER — Other Ambulatory Visit (HOSPITAL_COMMUNITY)
Admission: EM | Admit: 2022-12-12 | Discharge: 2022-12-13 | Disposition: A | Discharge status: Other Acute Inpatient Hospital | Payer: 59 | Attending: Psychiatry | Admitting: Psychiatry

## 2022-12-12 ENCOUNTER — Ambulatory Visit (HOSPITAL_COMMUNITY)
Admission: EM | Admit: 2022-12-12 | Discharge: 2022-12-12 | Disposition: A | Discharge status: Other Acute Inpatient Hospital | Payer: 59 | Attending: Registered Nurse | Admitting: Registered Nurse

## 2022-12-12 ENCOUNTER — Other Ambulatory Visit: Payer: Self-pay

## 2022-12-12 ENCOUNTER — Encounter (HOSPITAL_COMMUNITY): Payer: Self-pay | Admitting: Registered Nurse

## 2022-12-12 DIAGNOSIS — F1024 Alcohol dependence with alcohol-induced mood disorder: Secondary | ICD-10-CM | POA: Diagnosis not present

## 2022-12-12 DIAGNOSIS — Z0283 Encounter for blood-alcohol and blood-drug test: Secondary | ICD-10-CM | POA: Diagnosis not present

## 2022-12-12 DIAGNOSIS — F1020 Alcohol dependence, uncomplicated: Secondary | ICD-10-CM | POA: Diagnosis present

## 2022-12-12 DIAGNOSIS — F1094 Alcohol use, unspecified with alcohol-induced mood disorder: Secondary | ICD-10-CM | POA: Diagnosis present

## 2022-12-12 DIAGNOSIS — F1090 Alcohol use, unspecified, uncomplicated: Secondary | ICD-10-CM | POA: Diagnosis not present

## 2022-12-12 LAB — POCT URINE DRUG SCREEN - MANUAL ENTRY (I-SCREEN)
POC Amphetamine UR: NOT DETECTED
POC Buprenorphine (BUP): NOT DETECTED
POC Cocaine UR: NOT DETECTED
POC Marijuana UR: NOT DETECTED
POC Methadone UR: NOT DETECTED
POC Methamphetamine UR: NOT DETECTED
POC Morphine: NOT DETECTED
POC Oxazepam (BZO): NOT DETECTED
POC Oxycodone UR: NOT DETECTED
POC Secobarbital (BAR): NOT DETECTED

## 2022-12-12 LAB — URINALYSIS, ROUTINE W REFLEX MICROSCOPIC
Bilirubin Urine: NEGATIVE
Glucose, UA: NEGATIVE mg/dL
Hgb urine dipstick: NEGATIVE
Ketones, ur: NEGATIVE mg/dL
Leukocytes,Ua: NEGATIVE
Nitrite: NEGATIVE
Protein, ur: NEGATIVE mg/dL
Specific Gravity, Urine: 1.015 (ref 1.005–1.030)
pH: 7 (ref 5.0–8.0)

## 2022-12-12 LAB — CBC WITH DIFFERENTIAL/PLATELET
Abs Immature Granulocytes: 0.04 10*3/uL (ref 0.00–0.07)
Basophils Absolute: 0 10*3/uL (ref 0.0–0.1)
Basophils Relative: 1 %
Eosinophils Absolute: 0.1 10*3/uL (ref 0.0–0.5)
Eosinophils Relative: 2 %
HCT: 43 % (ref 39.0–52.0)
Hemoglobin: 14.3 g/dL (ref 13.0–17.0)
Immature Granulocytes: 1 %
Lymphocytes Relative: 27 %
Lymphs Abs: 2.2 10*3/uL (ref 0.7–4.0)
MCH: 28.6 pg (ref 26.0–34.0)
MCHC: 33.3 g/dL (ref 30.0–36.0)
MCV: 86 fL (ref 80.0–100.0)
Monocytes Absolute: 0.4 10*3/uL (ref 0.1–1.0)
Monocytes Relative: 5 %
Neutro Abs: 5.3 10*3/uL (ref 1.7–7.7)
Neutrophils Relative %: 64 %
Platelets: 200 10*3/uL (ref 150–400)
RBC: 5 MIL/uL (ref 4.22–5.81)
RDW: 14.8 % (ref 11.5–15.5)
WBC: 8.1 10*3/uL (ref 4.0–10.5)
nRBC: 0 % (ref 0.0–0.2)

## 2022-12-12 LAB — COMPREHENSIVE METABOLIC PANEL WITH GFR
ALT: 59 U/L — ABNORMAL HIGH (ref 0–44)
AST: 40 U/L (ref 15–41)
Albumin: 3.6 g/dL (ref 3.5–5.0)
Alkaline Phosphatase: 86 U/L (ref 38–126)
Anion gap: 15 (ref 5–15)
BUN: 6 mg/dL (ref 6–20)
CO2: 27 mmol/L (ref 22–32)
Calcium: 9.2 mg/dL (ref 8.9–10.3)
Chloride: 94 mmol/L — ABNORMAL LOW (ref 98–111)
Creatinine, Ser: 0.73 mg/dL (ref 0.61–1.24)
GFR, Estimated: >60 mL/min (ref >60)
Glucose, Bld: 141 mg/dL — ABNORMAL HIGH (ref 70–99)
Potassium: 4 mmol/L (ref 3.5–5.1)
Sodium: 136 mmol/L (ref 135–145)
Total Bilirubin: 0.6 mg/dL (ref 0.3–1.2)
Total Protein: 6.8 g/dL (ref 6.5–8.1)

## 2022-12-12 LAB — HEMOGLOBIN A1C
Hgb A1c MFr Bld: 7.7 % — ABNORMAL HIGH (ref 4.8–5.6)
Mean Plasma Glucose: 174.29 mg/dL

## 2022-12-12 LAB — LIPID PANEL
Cholesterol: 184 mg/dL (ref 0–200)
HDL: 37 mg/dL — ABNORMAL LOW (ref >40)
LDL Cholesterol: 97 mg/dL (ref 0–99)
Total CHOL/HDL Ratio: 5 ratio
Triglycerides: 250 mg/dL — ABNORMAL HIGH (ref <150)
VLDL: 50 mg/dL — ABNORMAL HIGH (ref 0–40)

## 2022-12-12 LAB — MAGNESIUM: Magnesium: 1.7 mg/dL (ref 1.7–2.4)

## 2022-12-12 LAB — TSH: TSH: 3.275 u[IU]/mL (ref 0.350–4.500)

## 2022-12-12 LAB — ETHANOL: Alcohol, Ethyl (B): <10 mg/dL (ref <10)

## 2022-12-12 MED ORDER — ALUM & MAG HYDROXIDE-SIMETH 200-200-20 MG/5ML PO SUSP: 30.0000 mL | ORAL | Status: DC | PRN

## 2022-12-12 MED ORDER — LOPERAMIDE HCL 2 MG PO CAPS: 2.0000 mg | ORAL_CAPSULE | ORAL | Status: DC | PRN

## 2022-12-12 MED ORDER — ALBUTEROL SULFATE HFA 108 (90 BASE) MCG/ACT IN AERS: 1.0000 | INHALATION_SPRAY | Freq: Four times a day (QID) | RESPIRATORY_TRACT | Status: DC | PRN

## 2022-12-12 MED ORDER — ADULT MULTIVITAMIN W/MINERALS CH
1.0000 | ORAL_TABLET | Freq: Every day | ORAL | Status: DC
  Administered 2022-12-12: 1 via ORAL
  Filled 2022-12-12: qty 1

## 2022-12-12 MED ORDER — CHLORDIAZEPOXIDE HCL 25 MG PO CAPS: 25.0000 mg | ORAL_CAPSULE | ORAL | Status: DC

## 2022-12-12 MED ORDER — CHLORDIAZEPOXIDE HCL 25 MG PO CAPS: 25.0000 mg | ORAL_CAPSULE | Freq: Three times a day (TID) | ORAL | Status: DC

## 2022-12-12 MED ORDER — CHLORDIAZEPOXIDE HCL 25 MG PO CAPS
25.0000 mg | ORAL_CAPSULE | Freq: Four times a day (QID) | ORAL | Status: DC
  Administered 2022-12-12: 25 mg via ORAL
  Filled 2022-12-12: qty 1

## 2022-12-12 MED ORDER — LISINOPRIL 5 MG PO TABS
5.0000 mg | ORAL_TABLET | Freq: Every day | ORAL | Status: DC
  Administered 2022-12-13: 5 mg via ORAL
  Filled 2022-12-12: qty 1

## 2022-12-12 MED ORDER — HYDROXYZINE HCL 25 MG PO TABS: 25.0000 mg | ORAL_TABLET | Freq: Four times a day (QID) | ORAL | Status: DC | PRN

## 2022-12-12 MED ORDER — CHLORDIAZEPOXIDE HCL 25 MG PO CAPS: 25.0000 mg | ORAL_CAPSULE | Freq: Every day | ORAL | Status: DC

## 2022-12-12 MED ORDER — LISINOPRIL 5 MG PO TABS
5.0000 mg | ORAL_TABLET | Freq: Every day | ORAL | Status: DC
  Administered 2022-12-12: 5 mg via ORAL
  Filled 2022-12-12: qty 1

## 2022-12-12 MED ORDER — MAGNESIUM HYDROXIDE 400 MG/5ML PO SUSP: 30.0000 mL | Freq: Every day | ORAL | Status: DC | PRN

## 2022-12-12 MED ORDER — GABAPENTIN 300 MG PO CAPS
300.0000 mg | ORAL_CAPSULE | Freq: Three times a day (TID) | ORAL | Status: DC
  Administered 2022-12-12: 300 mg via ORAL
  Filled 2022-12-12: qty 1

## 2022-12-12 MED ORDER — THIAMINE HCL 100 MG/ML IJ SOLN
100.0000 mg | Freq: Once | INTRAMUSCULAR | Status: AC
  Administered 2022-12-12: 100 mg via INTRAMUSCULAR
  Filled 2022-12-12: qty 2

## 2022-12-12 MED ORDER — ADULT MULTIVITAMIN W/MINERALS CH
1.0000 | ORAL_TABLET | Freq: Every day | ORAL | Status: DC
  Administered 2022-12-13: 1 via ORAL
  Filled 2022-12-12: qty 1

## 2022-12-12 MED ORDER — CHLORDIAZEPOXIDE HCL 25 MG PO CAPS
25.0000 mg | ORAL_CAPSULE | Freq: Three times a day (TID) | ORAL | Status: DC
  Administered 2022-12-13 (×2): 25 mg via ORAL
  Filled 2022-12-12 (×2): qty 1

## 2022-12-12 MED ORDER — ONDANSETRON 4 MG PO TBDP: 4.0000 mg | ORAL_TABLET | Freq: Four times a day (QID) | ORAL | Status: DC | PRN

## 2022-12-12 MED ORDER — AMLODIPINE BESYLATE 10 MG PO TABS
10.0000 mg | ORAL_TABLET | Freq: Every day | ORAL | Status: DC
  Administered 2022-12-12: 10 mg via ORAL
  Filled 2022-12-12: qty 1

## 2022-12-12 MED ORDER — CHLORDIAZEPOXIDE HCL 25 MG PO CAPS: 25.0000 mg | ORAL_CAPSULE | Freq: Four times a day (QID) | ORAL | Status: DC | PRN

## 2022-12-12 MED ORDER — GABAPENTIN 300 MG PO CAPS
300.0000 mg | ORAL_CAPSULE | Freq: Three times a day (TID) | ORAL | Status: DC
  Administered 2022-12-12 – 2022-12-13 (×4): 300 mg via ORAL
  Filled 2022-12-12 (×4): qty 1

## 2022-12-12 MED ORDER — HYDROXYZINE HCL 25 MG PO TABS
25.0000 mg | ORAL_TABLET | Freq: Four times a day (QID) | ORAL | Status: DC | PRN
  Administered 2022-12-13: 25 mg via ORAL
  Filled 2022-12-12: qty 1

## 2022-12-12 MED ORDER — CHLORDIAZEPOXIDE HCL 25 MG PO CAPS
25.0000 mg | ORAL_CAPSULE | Freq: Four times a day (QID) | ORAL | Status: AC
  Administered 2022-12-12 – 2022-12-13 (×3): 25 mg via ORAL
  Filled 2022-12-12 (×3): qty 1

## 2022-12-12 MED ORDER — AMLODIPINE BESYLATE 10 MG PO TABS
10.0000 mg | ORAL_TABLET | Freq: Every day | ORAL | Status: DC
  Administered 2022-12-13: 10 mg via ORAL
  Filled 2022-12-12: qty 1

## 2022-12-12 NOTE — ED Notes (Signed)
Patient is in the bedroom sleeping. NAD. Respirations even and unlabored. Will continue to monitor for safety

## 2022-12-12 NOTE — ED Notes (Signed)
Pt was provided lunch

## 2022-12-12 NOTE — ED Notes (Addendum)
Patient educated on the importance of fall prevention strategies such as wearing his yellow bracelet and slip resistant socks at all times as well as calling for help from staff if/when he becomes lightheaded and/or dizzy. Pt voiced understanding.

## 2022-12-12 NOTE — Group Note (Signed)
Group Topic: Relapse and Recovery  Group Date: 12/12/2022 Start Time: 2000 End Time: 2100 Facilitators: Rae Lips B  Department: [facility]  Number of Participants: 3  Group Focus: activities of daily living skills Treatment Modality:  Leisure Development Interventions utilized were leisure development Purpose: express feelings  Name: Kristopher [name] Date of Birth: [birth year]  MR: [id number]    Level of Participation: PT DID NOT ATTEND GROUP Quality of Participation: NA Interactions with others: NA Mood/Affect: NA Triggers (if applicable): NA Cognition: NA Progress: Other Response: NA Plan: patient will be encouraged to go to groups  Patients Problems:  Patient Active Problem List   Diagnosis Date Noted   Alcohol use disorder, severe, dependence (HCC) 12/12/2022   

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
## 2022-12-12 NOTE — Progress Notes (Signed)
   12/12/22 1049  [facility] Triage Screening (Walk-ins at [facility] only)  How Did You Hear About [place]? Self  What Is the Reason for Your Visit/Call Today? Kristopher [name] is a 52 yr old male presenting to the [facility] unaccompanied. Pt is here seeking detox. About 3 weeks ago the pt resigned from his job and noticed his drinking began to worsen. Pt reports that he has struggled with alcoholism since he was a teen. Pt is currently not on any medication or seeing a therapist. Pt reports he had 10 mikes hard drinks last night and reports worsening pain in his liver currently. Pt states, "its like my liver is on fire". Pt seems to need medical attention due to pain in his liver. Pt denies drug use, SI, HI and AVH.  How Long Has This Been Causing You Problems? 1 wk - 1 month  Have You Recently Had Any Thoughts About Hurting Yourself? No  Are You Planning to Commit Suicide/Harm Yourself At This time? No  Have you Recently Had Thoughts About Hurting Someone Karolee Ohs? No  Are You Planning To Harm Someone At This Time? No  Are you currently experiencing any auditory, visual or other hallucinations? No  Have You Used Any Alcohol or Drugs in the Past 24 Hours? Yes  How long ago did you use Drugs or Alcohol? last night  What Did You Use and How Much? 10 mikes hard  Do you have any current medical co-morbidities that require immediate attention? No  Clinician description of patient physical appearance/behavior: calm, cooperative  What Do You Feel Would Help You the Most Today? Alcohol or Drug Use Treatment  If access to [facility] was not available, would you have sought care in the Emergency Department? No  Determination of Need Routine (7 days)  Options For Referral Intensive Outpatient Therapy;Medication Management;Facility-Based Crisis

## 2022-12-12 NOTE — ED Notes (Signed)
Pt discharged to [facility]. AVS reviewed prior to discharge to [facility].  Pt alert, oriented, and ambulatory.  Safety maintained.

## 2022-12-12 NOTE — Discharge Instructions (Addendum)
Patient to be transferred to [facility] once labs results are back
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
## 2022-12-12 NOTE — BH Assessment (Signed)
Comprehensive Clinical Assessment (CCA) Note  12/12/2022 Kristopher [name] [id number]  DISPOSITION: Per Assunta Found NP pt is recommended for admission to [facility] for detox from alcohol   The patient demonstrates the following risk factors for suicide: Chronic risk factors for suicide include: psychiatric disorder of MDD, Recurrent and substance use disorder. Acute risk factors for suicide include: unemployment and loss (financial, interpersonal, professional). Protective factors for this patient include: hope for the future. Considering these factors, the overall suicide risk at this point appears to be low. Patient is appropriate for outpatient follow up   Per Triage assessment: "Kristopher [name] is a 52 yr old male presenting to the [facility] unaccompanied. Pt is here seeking detox. About 3 weeks ago the pt resigned from his job and noticed his drinking began to worsen. Pt reports that he has struggled with alcoholism since he was a teen. Pt is currently not on any medication or seeing a therapist. Pt reports he had 10 mikes hard drinks last night and reports worsening pain in his liver currently. Pt states, "its like my liver is on fire". Pt seems to need medical attention due to pain in his liver. Pt denies drug use, SI, HI and AVH."  With additional assessment: Pt denied SI, HI, NSSH, AVH, paranoia and any other substance use except for alcohol. Pt stated that he is not currently prescribed any psychiatric medications and is not currently seeing an OP therapist. Pt stated that he last saw a therapist about a year ago. Pt stated he has no access to guns and has no current legal issues. Pt stated that he is currently living with his cousin.   No symptoms reported of multiple traumatic situations over his lifetime. Pt stated that his mother went into a coma when he was 59 yrs old and died when he was 52 yo. Pt stated that he was raised by his aunt and uncle. Pt stated that his only son died as an infant in Jan 15, 1999. Pt  stated that his wife had bouts of depression and in 01-14-05, he came home from work to find she had gotten into a lockbox where his medication was kept and was dead. Pt stated he thinks it was the combination on a little bit of his medication and alcohol that killed her.   Pt stated that he has never remarried and has no other children. 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  Pt stated that about 3 weeks ago he was allowed to resign instead of being fired from his job as a Naval architect. Pt stated that alcohol use was not a factor in his resignation. Pt stated that he "made a mistake" on the job.     Chief Complaint:  Chief Complaint  Patient presents with   Alcohol Problem   Visit Diagnosis:  Alcohol Use d/o    CCA Screening, Triage and Referral (STR)  Patient Reported Information How did you hear about [place]? Self  What Is the Reason for Your Visit/Call Today? Kristopher [name] is a 52 yr old male presenting to the [facility] unaccompanied. Pt is here seeking detox. About 3 weeks ago the pt resigned from his job and noticed his drinking began to worsen. Pt reports that he has struggled with alcoholism since he was a teen. Pt is currently not on any medication or seeing a therapist. Pt reports he had 10 mikes hard drinks last night and reports  worsening pain in his liver currently. Pt states, "its like my liver is on fire". Pt seems to need medical attention due to pain in his liver. Pt denies drug use, SI, HI and AVH.  How Long Has This Been Causing You Problems? 1 wk - 1 month  What Do You Feel Would Help You the Most Today? Alcohol or Drug Use Treatment   Have You Recently Had Any Thoughts About Hurting Yourself? No  Are You Planning to Commit Suicide/Harm Yourself At This time? No   Flowsheet Row ED from 12/12/2022 in [facility] ED to Hosp-Admission (Discharged) from 08/27/2022 in [facility]  GENERAL MED/SURG UNIT ED from 06/25/2022 in [facility] Health Urgent Care at [facility] RISK CATEGORY No Risk No Risk No Risk       Have you Recently Had Thoughts About Hurting Someone Karolee Ohs? No  Are You Planning to Harm Someone at This Time? No  Explanation: na  Have You Used Any Alcohol or Drugs in the Past 24 Hours? Yes  What Did You Use and How Much? 10 mikes hard   Do You Currently Have a Therapist/Psychiatrist? No  Name of Therapist/Psychiatrist: Name of Therapist/Psychiatrist: na   Have You Been Recently Discharged From Any Office Practice or Programs? No  Explanation of Discharge From Practice/Program: na     CCA Screening Triage Referral Assessment Type of Contact: Face-to-Face  Telemedicine Service Delivery:   Is this Initial or Reassessment?   Date Telepsych consult ordered in [facility]:    Time Telepsych consult ordered in [facility]:    Location of Assessment: [facility] Assessment Services  Provider Location: [facility] Assessment Services   Collateral Involvement: none   Does Patient Have a Automotive engineer Guardian? No  Legal Guardian Contact Information: na  Copy of Legal Guardianship Form: No - copy requested  Legal Guardian Notified of Arrival: -- (na)  Legal Guardian Notified of Pending Discharge: -- (na)  If Minor and Not Living with Parent(s), Who has Custody? adult  Is CPS involved or ever been involved? Never  Is APS involved or ever been involved? Never   Patient Determined To Be At Risk for Harm To Self or Others Based on Review of Patient Reported Information or Presenting Complaint? No  Method: No Plan  Availability of Means: No access or NA  Intent: Vague intent or NA  Notification Required: No need or identified person  Additional Information for Danger to Others Potential: na Additional Comments for Danger to Others Potential: na  Are There Guns or Other Weapons in Your Home? No (denied)  Types of Guns/Weapons: na  Are These  Weapons Safely Secured?                            -- (na)  Who Could Verify You Are Able To Have These Secured: na  Do You Have any Outstanding Charges, Pending Court Dates, Parole/Probation? none reported  Contacted To Inform of Risk of Harm To Self or Others: -- (na)    Does Patient Present under Involuntary Commitment? No    [place] of Residence: [place]   Patient Currently Receiving the Following Services: Not Receiving Services   Determination of Need: Urgent (48 hours) (Per Shuvon Rankin NP pt is recommended for admission to [facility] for detox from alcohol)   Options For Referral: Facility-Based Crisis     CCA Biopsychosocial Patient Reported Schizophrenia/Schizoaffective Diagnosis in Past: No   Strengths:  able to ask for help and accept help   Mental Health Symptoms Depression:   None (none reported)   Duration of Depressive symptoms:    Mania:   None   Anxiety:    None (none reported)   Psychosis:   None   Duration of Psychotic symptoms:    Trauma:   None (No symptoms reported of multiple traumatic situations over his lifetime.)   Obsessions:   None   Compulsions:   None   Inattention:   N/A   Hyperactivity/Impulsivity:   N/A   Oppositional/Defiant Behaviors:   N/A   Emotional Irregularity:   None (none reported)   Other Mood/Personality Symptoms:   none observed or reported    Mental Status Exam Appearance and self-care  Stature:   Average   Weight:   Overweight   Clothing:   Casual; Neat/clean   Grooming:   Normal   Cosmetic use:   None   Posture/gait:   Normal   Motor activity:   Not Remarkable   Sensorium  Attention:   Normal   Concentration:   Normal   Orientation:   X5   Recall/memory:   Normal   Affect and Mood  Affect:   Flat; Constricted   Mood:   Dysphoric   Relating  Eye contact:   Normal   Facial expression:   Constricted   Attitude toward examiner:   Cooperative   Thought  and Language  Speech flow:  Clear and Coherent; Paucity   Thought content:   Appropriate to Mood and Circumstances   Preoccupation:   None   Hallucinations:   None   Organization:   Intact; Coherent   [employer] of Knowledge:   Average   Intelligence:   Average   Abstraction:   Normal   Judgement:   Fair   Programmer, systems   Insight:   Fair   Decision Making:   Vacilates   Social Functioning  Social Maturity:   Responsible   Social Judgement:   Normal   Stress  Stressors:   Grief/losses; Financial; Work   Coping Ability:   Exhausted   Skill Deficits:   Responsibility; Decision making   Supports:   Friends/Service system; Support needed     Religion: Religion/Spirituality Are You A Religious Person?: Yes What is Your Religious Affiliation?: Baptist How Might This Affect Treatment?: unknown  Leisure/Recreation: Leisure / Recreation Do You Have Hobbies?: Yes Leisure and Hobbies: Fishing  Exercise/Diet: Exercise/Diet Do You Exercise?: No Have You Gained or Lost A Significant Amount of Weight in the Past Six Months?: No Do You Follow a Special Diet?: No Do You Have Any Trouble Sleeping?: No   CCA Employment/Education Employment/Work Situation: Employment / Work Situation Employment Situation: Unemployed (Pt stated that about 3 weeks ago he was allowed to resign instead of 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) Did Theme park manager?: Yes What Type of College Degree Do you Have?: truck driving certification Did You Have An Individualized Education Program (IIEP): No Did You Have Any  Difficulty At School?: No Patient's Education Has Been Impacted by Current Illness: No   CCA Family/Childhood History Family and Relationship  History: Family history Marital status: Widowed Widowed, when?: 2004/12/28 Does patient have children?: No (Pt stated that his only son died as an infant in 12-29-1998.)  Childhood History:  Childhood History By whom was/is the patient raised?: Other (Comment) (aunt and uncle) Did patient suffer any verbal/emotional/physical/sexual abuse as a child?: No Did patient suffer from severe childhood neglect?: No Has patient ever been sexually abused/assaulted/raped as an adolescent or adult?: No Was the patient ever a victim of a crime or a disaster?: No Witnessed domestic violence?: No Has patient been affected by domestic violence as an adult?: No       CCA Substance Use Alcohol/Drug Use: Alcohol / Drug Use Pain Medications: see MAR Prescriptions: see MAR Over the Counter: see MAR History of alcohol / drug use?: Yes Longest period of sobriety (when/how long): unknown Negative Consequences of Use:  (none reported) Withdrawal Symptoms: None (none reported) Substance #1 Name of Substance 1: alcohol 1 - Age of First Use: teen 1 - Amount (size/oz): currently 10 mike's Hard [place] 1 - Frequency: daily 1 - Duration: ongoing 1 - Last Use / Amount: yresterday 1 - Method of Aquiring: purchase 1- Route of Use: drink, oral                       ASAM's:  Six Dimensions of Multidimensional Assessment  Dimension 1:  Acute Intoxication and/or Withdrawal Potential:   Dimension 1:  Description of individual's past and current experiences of substance use and withdrawal: none reported  Dimension 2:  Biomedical Conditions and Complications:   Dimension 2:  Description of patient's biomedical conditions and  complications: current liver issues  Dimension 3:  Emotional, Behavioral, or Cognitive Conditions and Complications:  Dimension 3:   Description of emotional, behavioral, or cognitive conditions and complications: multiple bouts of depression  Dimension 4:  Readiness to Change:     Dimension 5:  Relapse, Continued use, or Continued Problem Potential:     Dimension 6:  Recovery/Living Environment:     ASAM Severity Score: ASAM's Severity Rating Score: 10  ASAM Recommended Level of Treatment: ASAM Recommended Level of Treatment: Level II Intensive Outpatient Treatment   Substance use Disorder (SUD) Substance Use Disorder (SUD)  Checklist Symptoms of Substance Use: Presence of craving or strong urge to use  Recommendations for Services/Supports/Treatments: Recommendations for Services/Supports/Treatments Recommendations For Services/Supports/Treatments: IOP (Intensive Outpatient Program)  Discharge Disposition:    DSM5 Diagnoses: Patient Active Problem List   Diagnosis Date Noted   Alcohol use disorder, severe, dependence (HCC) 12/12/2022   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     Referrals to Alternative Service(s): Referred to Alternative Service(s):   Place:   Date:   Time:    Referred to Alternative Service(s):   Place:   Date:   Time:    Referred to Alternative Service(s):   Place:   Date:   Time:    Referred to Alternative Service(s):   Place:   Date:   Time:  Carolanne Grumbling, Counselor

## 2022-12-12 NOTE — ED Notes (Signed)
Patient Alert and Oriented X 3. He denies SI/HI/ or AVH. Patient contracted for safety. He is currently calm and cooperative. Safety of environment ensured.  Patient belongings placed in locker # 14. Includes 2 bracelets, 1 cell phone, 1 charging cord, 1 large black bag of clothing, one pair of shoes, and one pair of socks. Tour of unit given and patient was introduced to his peers. Patient currently eating dinner. No additional needs at this time. Will continue to monitor for safety.

## 2022-12-13 ENCOUNTER — Encounter (HOSPITAL_COMMUNITY): Payer: Self-pay | Admitting: Registered Nurse

## 2022-12-13 DIAGNOSIS — F1090 Alcohol use, unspecified, uncomplicated: Secondary | ICD-10-CM | POA: Diagnosis not present

## 2022-12-13 LAB — PROLACTIN: Prolactin: 7 ng/mL (ref 3.6–25.2)

## 2022-12-13 MED ORDER — THIAMINE HCL 100 MG PO TABS
100.0000 mg | ORAL_TABLET | Freq: Every day | ORAL | Status: DC
  Administered 2022-12-13: 100 mg via ORAL
  Filled 2022-12-13 (×3): qty 1

## 2022-12-13 MED ORDER — NICOTINE 14 MG/24HR TD PT24
14.0000 mg | MEDICATED_PATCH | Freq: Every day | TRANSDERMAL | Status: DC
  Administered 2022-12-13: 14 mg via TRANSDERMAL
  Filled 2022-12-13: qty 1

## 2022-12-13 MED ORDER — NALTREXONE HCL 50 MG PO TABS
50.0000 mg | ORAL_TABLET | Freq: Every day | ORAL | Status: DC
  Administered 2022-12-13: 50 mg via ORAL
  Filled 2022-12-13: qty 1

## 2022-12-13 MED ORDER — ACETAMINOPHEN 325 MG PO TABS
650.0000 mg | ORAL_TABLET | Freq: Four times a day (QID) | ORAL | Status: DC | PRN
  Administered 2022-12-13: 650 mg via ORAL
  Filled 2022-12-13: qty 2

## 2022-12-13 NOTE — ED Notes (Signed)
Pt fell in between 15 min checks. Pt stats he was attempting to go to restroom and legs his gave out. Fall unwitnessed but tech heard fall while in nearby room.

## 2022-12-13 NOTE — ED Notes (Addendum)
This nurse called patients emergency contact Mrs. Rennis Harding [phone] to make her aware of patients fall and transport to emergency room, however there was no answer.

## 2022-12-13 NOTE — ED Provider Notes (Signed)
Writer was notify by RN that patient fell in his room.  Upon assessment patient is alert and oriented.  Upon entering the room,  patient is observed laying on the floor when asked if he can move his legs patient stated he was unable to move both legs. Patient reports that his legs are burning and that they hurts.  Per the patient this is the second time he has fallen when he was in a rehab facility and it does feel to him the same way where he could move his legs and he had burning to his legs.  Review of patient records show that patient has a history of diabetes COPD, alcohol abuse.  PERRLA.  Patient able to move upper body.  Writer did speak with Dr. Donnald Garre MD. at [facility]. Vital state (see flow sheet). EMS was called to transport patient to [facility]: ED.

## 2022-12-13 NOTE — ED Notes (Addendum)
EMS called to transfer pt to [facility]

## 2022-12-13 NOTE — Group Note (Signed)
Group Topic: Positive Affirmations  Group Date: 12/13/2022 Start Time: 0815 End Time: 0835 Facilitators: Debe Coder, NT  Department: [facility]  Number of Participants: 7  Group Focus: affirmation Treatment Modality:  Solution-Focused Therapy Interventions utilized were group exercise Purpose: express feelings  Name: Kristopher [name] Date of Birth: [birth year]  MR: [id number]    Level of Participation: did not attend Quality of Participation:  Interactions with others:  Mood/Affect:  Triggers (if applicable):  Cognition:  Progress:  Response:  Plan:   Patients Problems:  Patient Active Problem List   Diagnosis Date Noted   Alcohol use disorder, severe, dependence (HCC) 12/12/2022   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

## 2022-12-13 NOTE — ED Notes (Signed)
Patient is in the bedroom sleeping. NAD. Respirations even and unlabored. Will continue to monitor for safety

## 2022-12-13 NOTE — ED Provider Notes (Addendum)
Behavioral Health Progress Note  Date and Time: 12/13/2022 11:27 AM Name: Kristopher [name] MRN:  [id number]  Subjective:  Kristopher [name] is a 52 y.o. male patient presented to [facility] with complaints of alcohol abuse and requesting assistance with detox.   Patient seen this AM. Patient notes feeling terrible and attributes this to his withdrawal symptoms from alcohol. He notes a skin crawling sensation and diaphoresis. He also reports back, stomach, and leg pain which he has been experiencing chronically. Discussed how we will add PRN tylenol and if this does not help we will increase gabapentin. Patient is also agreeable to starting naltrexone to aid with his alcohol cravings and I discussed risks and benefits of the medication. He denies SI, HI, and AVH. He is wanting to start working when he leaves the [facility] but understands that he needs substance use treatment. He is open to going to [facility] and I discussed how CSW will assist in getting him connected.  Diagnosis:  Final diagnoses:  Alcohol use disorder    Total Time spent with patient: 30 minutes  Past Psychiatric History: AUD   Past Medical History: HTN, COPD Family History: DM Social History: lives with cousin and cousin's husband in [place], previously works as a Naval architect but recently unemployed, support system is his cousin  Additional Social History:                         Sleep: Fair  Appetite:  Fair  Current Medications:  Current Facility-Administered Medications  Medication Dose Route Frequency Provider Last Rate Last Admin   acetaminophen (TYLENOL) tablet 650 mg  650 mg Oral Q6H PRN Lance Muss, MD       albuterol (VENTOLIN HFA) 108 (90 Base) MCG/ACT inhaler 1-2 puff  1-2 puff Inhalation Q6H PRN Rankin, Shuvon B, NP       alum & mag hydroxide-simeth (MAALOX/MYLANTA) 200-200-20 MG/5ML suspension 30 mL  30 mL Oral Q4H PRN Rankin, Shuvon B, NP       amLODipine (NORVASC) tablet 10 mg  10 mg Oral Daily Rankin,  Shuvon B, NP   10 mg at 12/13/22 0836   chlordiazePOXIDE (LIBRIUM) capsule 25 mg  25 mg Oral Q6H PRN Rankin, Shuvon B, NP       chlordiazePOXIDE (LIBRIUM) capsule 25 mg  25 mg Oral TID Rankin, Shuvon B, NP       Followed by   Melene Muller ON 12/14/2022] chlordiazePOXIDE (LIBRIUM) capsule 25 mg  25 mg Oral BH-qamhs Rankin, Shuvon B, NP       Followed by   Melene Muller ON 12/15/2022] chlordiazePOXIDE (LIBRIUM) capsule 25 mg  25 mg Oral Daily Rankin, Shuvon B, NP       gabapentin (NEURONTIN) capsule 300 mg  300 mg Oral TID Rankin, Shuvon B, NP   300 mg at 12/13/22 0836   hydrOXYzine (ATARAX) tablet 25 mg  25 mg Oral Q6H PRN Rankin, Shuvon B, NP       lisinopril (ZESTRIL) tablet 5 mg  5 mg Oral Daily Rankin, Shuvon B, NP   5 mg at 12/13/22 0836   loperamide (IMODIUM) capsule 2-4 mg  2-4 mg Oral PRN Rankin, Shuvon B, NP       magnesium hydroxide (MILK OF MAGNESIA) suspension 30 mL  30 mL Oral Daily PRN Rankin, Shuvon B, NP       multivitamin with minerals tablet 1 tablet  1 tablet Oral Daily Rankin, Shuvon B, NP   1 tablet at  12/13/22 0835   naltrexone (DEPADE) tablet 50 mg  50 mg Oral Daily Kizzie Ide B, MD       ondansetron (ZOFRAN-ODT) disintegrating tablet 4 mg  4 mg Oral Q6H PRN Rankin, Shuvon B, NP       Current Outpatient Medications  Medication Sig Dispense Refill   albuterol (VENTOLIN HFA) 108 (90 Base) MCG/ACT inhaler Inhale 1-2 puffs into the lungs every 6 (six) hours as needed for wheezing or shortness of breath. 8 g 2   amLODipine (NORVASC) 10 MG tablet Take 1 tablet (10 mg total) by mouth daily. 30 tablet 0   diclofenac Sodium (VOLTAREN) 1 % GEL Apply 2 g topically 4 (four) times daily as needed (For pain).     gabapentin (NEURONTIN) 300 MG capsule Take 1 capsule (300 mg total) by mouth 3 (three) times daily. 90 capsule 0   lisinopril (ZESTRIL) 5 MG tablet Take 1 tablet (5 mg total) by mouth daily. 30 tablet 0   methocarbamol (ROBAXIN) 750 MG tablet Take 1 tablet (750 mg total) by mouth 3  (three) times daily. (Patient taking differently: Take 750 mg by mouth 3 (three) times daily as needed for muscle spasms.) 20 tablet 0   Multiple Vitamin (MULTIVITAMIN WITH MINERALS) TABS tablet Take 1 tablet by mouth daily. 90 tablet 0   oxyCODONE (OXY IR/ROXICODONE) 5 MG immediate release tablet Take 5 mg by mouth every 6 (six) hours as needed for moderate pain.     polyvinyl alcohol (LIQUIFILM TEARS) 1.4 % ophthalmic solution Place 1-2 drops into both eyes as needed for dry eyes.      Labs  Lab Results:  Admission on 12/12/2022, Discharged on 12/12/2022  Component Date Value Ref Range Status   WBC 12/12/2022 8.1  4.0 - 10.5 K/uL Final   RBC 12/12/2022 5.00  4.22 - 5.81 MIL/uL Final   Hemoglobin 12/12/2022 14.3  13.0 - 17.0 g/dL Final   HCT 16/12/9602 43.0  39.0 - 52.0 % Final   MCV 12/12/2022 86.0  80.0 - 100.0 fL Final   MCH 12/12/2022 28.6  26.0 - 34.0 pg Final   MCHC 12/12/2022 33.3  30.0 - 36.0 g/dL Final   RDW 54/11/8117 14.8  11.5 - 15.5 % Final   Platelets 12/12/2022 200  150 - 400 K/uL Final   nRBC 12/12/2022 0.0  0.0 - 0.2 % Final   Neutrophils Relative % 12/12/2022 64  % Final   Neutro Abs 12/12/2022 5.3  1.7 - 7.7 K/uL Final   Lymphocytes Relative 12/12/2022 27  % Final   Lymphs Abs 12/12/2022 2.2  0.7 - 4.0 K/uL Final   Monocytes Relative 12/12/2022 5  % Final   Monocytes Absolute 12/12/2022 0.4  0.1 - 1.0 K/uL Final   Eosinophils Relative 12/12/2022 2  % Final   Eosinophils Absolute 12/12/2022 0.1  0.0 - 0.5 K/uL Final   Basophils Relative 12/12/2022 1  % Final   Basophils Absolute 12/12/2022 0.0  0.0 - 0.1 K/uL Final   Immature Granulocytes 12/12/2022 1  % Final   Abs Immature Granulocytes 12/12/2022 0.04  0.00 - 0.07 K/uL Final   Performed at [facility] Lab, [address] [place], [place] [address]   Sodium 12/12/2022 136  135 - 145 mmol/L Final   Potassium 12/12/2022 4.0  3.5 - 5.1 mmol/L Final   Chloride 12/12/2022 94 (L)  98 - 111 mmol/L Final   CO2  12/12/2022 27  22 - 32 mmol/L Final   Glucose, Bld 12/12/2022 141 (  H)  70 - 99 mg/dL Final   Glucose reference range applies only to samples taken after fasting for at least 8 hours.   BUN 12/12/2022 6  6 - 20 mg/dL Final   Creatinine, Ser 12/12/2022 0.73  0.61 - 1.24 mg/dL Final   Calcium 16/12/9602 9.2  8.9 - 10.3 mg/dL Final   Total Protein 54/11/8117 6.8  6.5 - 8.1 g/dL Final   Albumin 14/78/2956 3.6  3.5 - 5.0 g/dL Final   AST 21/30/8657 40  15 - 41 U/L Final   ALT 12/12/2022 59 (H)  0 - 44 U/L Final   Alkaline Phosphatase 12/12/2022 86  38 - 126 U/L Final   Total Bilirubin 12/12/2022 0.6  0.3 - 1.2 mg/dL Final   GFR, Estimated 12/12/2022 >60  >60 mL/min Final   Comment: (NOTE) Calculated using the CKD-EPI Creatinine Equation (2021)    Anion gap 12/12/2022 15  5 - 15 Final   Performed at [facility] Lab, [address] [place], [place] [address]   Hgb A1c MFr Bld 12/12/2022 7.7 (H)  4.8 - 5.6 % Final   Comment: (NOTE) Pre diabetes:          5.7%-6.4%  Diabetes:              >6.4%  Glycemic control for   <7.0% adults with diabetes    Mean Plasma Glucose 12/12/2022 174.29  mg/dL Final   Performed at [facility] Lab, [address] [place], [place] [address]   Magnesium 12/12/2022 1.7  1.7 - 2.4 mg/dL Final   Performed at [facility] Lab, [address], [place], [place] [address]   Alcohol, Ethyl (B) 12/12/2022 <10  <10 mg/dL Final   Comment: (NOTE) Lowest detectable limit for serum alcohol is 10 mg/dL.  For medical purposes only. Performed at [facility] Lab, [address] [place], [place] [address]    Cholesterol 12/12/2022 184  0 - 200 mg/dL Final   Triglycerides 72/53/6644 250 (H)  <150 mg/dL Final   HDL 03/47/4259 37 (L)  >40 mg/dL Final   Total CHOL/HDL Ratio 12/12/2022 5.0  RATIO Final   VLDL 12/12/2022 50 (H)  0 - 40 mg/dL Final   LDL Cholesterol 12/12/2022 97  0 - 99 mg/dL Final   Comment:        Total Cholesterol/HDL:CHD Risk Coronary Heart  Disease Risk Table                     Men   Women  1/2 Average Risk   3.4   3.3  Average Risk       5.0   4.4  2 X Average Risk   9.6   7.1  3 X Average Risk  23.4   11.0        Use the calculated Patient Ratio above and the CHD Risk Table to determine the patient's CHD Risk.        ATP III CLASSIFICATION (LDL):  <100     mg/dL   Optimal  563-875  mg/dL   Near or Above                    Optimal  130-159  mg/dL   Borderline  643-329  mg/dL   High  >518     mg/dL   Very High Performed at [facility], [address] [place], [place] [address]    TSH 12/12/2022 3.275  0.350 -  4.500 uIU/mL Final   Comment: Performed by a 3rd Generation assay with a functional sensitivity of <=0.01 uIU/mL. Performed at [facility] Lab, [address] [place], [place] [address]    Prolactin 12/12/2022 7.0  3.6 - 25.2 ng/mL Final   Comment: (NOTE) Performed At: [facility] [address] [place], [place] [id number] Jolene Schimke MD UY:[id number]    Color, Urine 12/12/2022 YELLOW  YELLOW Final   APPearance 12/12/2022 CLEAR  CLEAR Final   Specific Gravity, Urine 12/12/2022 1.015  1.005 - 1.030 Final   pH 12/12/2022 7.0  5.0 - 8.0 Final   Glucose, UA 12/12/2022 NEGATIVE  NEGATIVE mg/dL Final   Hgb urine dipstick 12/12/2022 NEGATIVE  NEGATIVE Final   Bilirubin Urine 12/12/2022 NEGATIVE  NEGATIVE Final   Ketones, ur 12/12/2022 NEGATIVE  NEGATIVE mg/dL Final   Protein, ur 63/87/5643 NEGATIVE  NEGATIVE mg/dL Final   Nitrite 32/95/1884 NEGATIVE  NEGATIVE Final   Leukocytes,Ua 12/12/2022 NEGATIVE  NEGATIVE Final   Performed at [facility] Lab, [address], [place], [place] [address]   POC Amphetamine UR 12/12/2022 None Detected  NONE DETECTED (Cut Off Level 1000 ng/mL) Final   POC Secobarbital (BAR) 12/12/2022 None Detected  NONE DETECTED (Cut Off Level 300 ng/mL) Final   POC Buprenorphine (BUP) 12/12/2022 None Detected  NONE DETECTED (Cut Off Level 10 ng/mL) Final   POC  Oxazepam (BZO) 12/12/2022 None Detected  NONE DETECTED (Cut Off Level 300 ng/mL) Final   POC Cocaine UR 12/12/2022 None Detected  NONE DETECTED (Cut Off Level 300 ng/mL) Final   POC Methamphetamine UR 12/12/2022 None Detected  NONE DETECTED (Cut Off Level 1000 ng/mL) Final   POC Morphine 12/12/2022 None Detected  NONE DETECTED (Cut Off Level 300 ng/mL) Final   POC Methadone UR 12/12/2022 None Detected  NONE DETECTED (Cut Off Level 300 ng/mL) Final   POC Oxycodone UR 12/12/2022 None Detected  NONE DETECTED (Cut Off Level 100 ng/mL) Final   POC Marijuana UR 12/12/2022 None Detected  NONE DETECTED (Cut Off Level 50 ng/mL) Final  Admission on 08/27/2022, Discharged on 08/31/2022  Component Date Value Ref Range Status   WBC 08/27/2022 7.6  4.0 - 10.5 K/uL Final   RBC 08/27/2022 5.45  4.22 - 5.81 MIL/uL Final   Hemoglobin 08/27/2022 15.6  13.0 - 17.0 g/dL Final   HCT 30/16/0109 46.1  39.0 - 52.0 % Final   MCV 08/27/2022 84.6  80.0 - 100.0 fL Final   MCH 08/27/2022 28.6  26.0 - 34.0 pg Final   MCHC 08/27/2022 33.8  30.0 - 36.0 g/dL Final   RDW 32/35/5732 13.9  11.5 - 15.5 % Final   Platelets 08/27/2022 221  150 - 400 K/uL Final   nRBC 08/27/2022 0.0  0.0 - 0.2 % Final   Neutrophils Relative % 08/27/2022 60  % Final   Neutro Abs 08/27/2022 4.6  1.7 - 7.7 K/uL Final   Lymphocytes Relative 08/27/2022 32  % Final   Lymphs Abs 08/27/2022 2.5  0.7 - 4.0 K/uL Final   Monocytes Relative 08/27/2022 5  % Final   Monocytes Absolute 08/27/2022 0.4  0.1 - 1.0 K/uL Final   Eosinophils Relative 08/27/2022 2  % Final   Eosinophils Absolute 08/27/2022 0.1  0.0 - 0.5 K/uL Final   Basophils Relative 08/27/2022 1  % Final   Basophils Absolute 08/27/2022 0.1  0.0 - 0.1 K/uL Final   Immature Granulocytes 08/27/2022 0  % Final   Abs Immature Granulocytes 08/27/2022 0.02  0.00 - 0.07 K/uL Final   Performed at [facility], [address], [place], [place] [address]   Sodium 08/27/2022 133 (L)  135 - 145  mmol/L Final   Potassium 08/27/2022 3.9  3.5 - 5.1 mmol/L Final   Chloride 08/27/2022 100  98 - 111 mmol/L Final   CO2 08/27/2022 24  22 - 32 mmol/L Final   Glucose, Bld 08/27/2022 142 (H)  70 - 99 mg/dL Final   Glucose reference range applies only to samples taken after fasting for at least 8 hours.   BUN 08/27/2022 8  6 - 20 mg/dL Final   Creatinine, Ser 08/27/2022 0.80  0.61 - 1.24 mg/dL Final   Calcium 84/13/2440 9.0  8.9 - 10.3 mg/dL Final   Total Protein 01/20/2535 7.4  6.5 - 8.1 g/dL Final   Albumin 64/40/3474 3.9  3.5 - 5.0 g/dL Final   AST 25/95/6387 26  15 - 41 U/L Final   ALT 08/27/2022 28  0 - 44 U/L Final   Alkaline Phosphatase 08/27/2022 48  38 - 126 U/L Final   Total Bilirubin 08/27/2022 0.7  0.3 - 1.2 mg/dL Final   GFR, Estimated 08/27/2022 >60  >60 mL/min Final   Comment: (NOTE) Calculated using the CKD-EPI Creatinine Equation (2021)    Anion gap 08/27/2022 9  5 - 15 Final   Performed at [facility], [address], [place], [place] [address]   Specimen Source 08/27/2022 URINE, CLEAN CATCH   Final   Color, Urine 08/27/2022 YELLOW  YELLOW Final   APPearance 08/27/2022 CLEAR  CLEAR Final   Specific Gravity, Urine 08/27/2022 1.011  1.005 - 1.030 Final   pH 08/27/2022 5.0  5.0 - 8.0 Final   Glucose, UA 08/27/2022 NEGATIVE  NEGATIVE mg/dL Final   Hgb urine dipstick 08/27/2022 NEGATIVE  NEGATIVE Final   Bilirubin Urine 08/27/2022 NEGATIVE  NEGATIVE Final   Ketones, ur 08/27/2022 NEGATIVE  NEGATIVE mg/dL Final   Protein, ur 29/51/8841 NEGATIVE  NEGATIVE mg/dL Final   Nitrite 66/08/3014 NEGATIVE  NEGATIVE Final   Leukocytes,Ua 08/27/2022 NEGATIVE  NEGATIVE Final   RBC / HPF 08/27/2022 0-5  0 - 5 RBC/hpf Final   WBC, UA 08/27/2022 0-5  0 - 5 WBC/hpf Final   Comment:        Reflex urine culture not performed if WBC <=10, OR if Squamous epithelial cells >5. If Squamous epithelial cells >5 suggest recollection.    Bacteria, UA 08/27/2022 NONE SEEN  NONE SEEN Final    Squamous Epithelial / HPF 08/27/2022 0-5  0 - 5 /HPF Final   Mucus 08/27/2022 PRESENT   Final   Performed at [facility], [address] [place], [place] [address]   Opiates 08/27/2022 POSITIVE (A)  NONE DETECTED Final   Cocaine 08/27/2022 NONE DETECTED  NONE DETECTED Final   Benzodiazepines 08/27/2022 NONE DETECTED  NONE DETECTED Final   Amphetamines 08/27/2022 NONE DETECTED  NONE DETECTED Final   Tetrahydrocannabinol 08/27/2022 NONE DETECTED  NONE DETECTED Final   Barbiturates 08/27/2022 NONE DETECTED  NONE DETECTED Final   Comment: (NOTE) DRUG SCREEN FOR MEDICAL PURPOSES ONLY.  IF CONFIRMATION IS NEEDED FOR ANY PURPOSE, NOTIFY LAB WITHIN 5 DAYS.  LOWEST DETECTABLE LIMITS FOR URINE DRUG SCREEN Drug Class                     Cutoff (ng/mL) Amphetamine and metabolites    1000 Barbiturate and metabolites    200 Benzodiazepine  200 Opiates and metabolites        300 Cocaine and metabolites        300 THC                            50 Performed at [facility], [address], [place], [place] [address]    Magnesium 08/27/2022 1.9  1.7 - 2.4 mg/dL Final   Performed at [facility], [address] [place], [place] [address]   WBC 08/28/2022 9.0  4.0 - 10.5 K/uL Final   RBC 08/28/2022 5.27  4.22 - 5.81 MIL/uL Final   Hemoglobin 08/28/2022 14.6  13.0 - 17.0 g/dL Final   HCT 30/86/5784 44.5  39.0 - 52.0 % Final   MCV 08/28/2022 84.4  80.0 - 100.0 fL Final   MCH 08/28/2022 27.7  26.0 - 34.0 pg Final   MCHC 08/28/2022 32.8  30.0 - 36.0 g/dL Final   RDW 69/62/9528 14.0  11.5 - 15.5 % Final   Platelets 08/28/2022 242  150 - 400 K/uL Final   nRBC 08/28/2022 0.0  0.0 - 0.2 % Final   Neutrophils Relative % 08/28/2022 66  % Final   Neutro Abs 08/28/2022 6.0  1.7 - 7.7 K/uL Final   Lymphocytes Relative 08/28/2022 26  % Final   Lymphs Abs 08/28/2022 2.3  0.7 - 4.0 K/uL Final   Monocytes Relative 08/28/2022 6  % Final   Monocytes Absolute 08/28/2022  0.5  0.1 - 1.0 K/uL Final   Eosinophils Relative 08/28/2022 1  % Final   Eosinophils Absolute 08/28/2022 0.1  0.0 - 0.5 K/uL Final   Basophils Relative 08/28/2022 1  % Final   Basophils Absolute 08/28/2022 0.1  0.0 - 0.1 K/uL Final   Immature Granulocytes 08/28/2022 0  % Final   Abs Immature Granulocytes 08/28/2022 0.03  0.00 - 0.07 K/uL Final   Performed at [facility], [address], [place], [place] [address]   Sodium 08/28/2022 133 (L)  135 - 145 mmol/L Final   Potassium 08/28/2022 3.8  3.5 - 5.1 mmol/L Final   Chloride 08/28/2022 99  98 - 111 mmol/L Final   CO2 08/28/2022 26  22 - 32 mmol/L Final   Glucose, Bld 08/28/2022 107 (H)  70 - 99 mg/dL Final   Glucose reference range applies only to samples taken after fasting for at least 8 hours.   BUN 08/28/2022 14  6 - 20 mg/dL Final   Creatinine, Ser 08/28/2022 0.85  0.61 - 1.24 mg/dL Final   Calcium 40/12/2723 9.0  8.9 - 10.3 mg/dL Final   Total Protein 36/64/4034 6.7  6.5 - 8.1 g/dL Final   Albumin 74/25/9563 3.8  3.5 - 5.0 g/dL Final   AST 87/56/4332 31  15 - 41 U/L Final   ALT 08/28/2022 34  0 - 44 U/L Final   Alkaline Phosphatase 08/28/2022 52  38 - 126 U/L Final   Total Bilirubin 08/28/2022 0.6  0.3 - 1.2 mg/dL Final   GFR, Estimated 08/28/2022 >60  >60 mL/min Final   Comment: (NOTE) Calculated using the CKD-EPI Creatinine Equation (2021)    Anion gap 08/28/2022 8  5 - 15 Final   Performed at [facility], [address] [place], [place] [address]   Magnesium 08/28/2022 2.0  1.7 - 2.4 mg/dL Final   Performed at [facility], [address] [place], [place] [address]  Phosphorus 08/28/2022 4.3  2.5 - 4.6 mg/dL Final   Performed at [facility], [address], [place], [place] [address]   WBC 08/29/2022 7.5  4.0 - 10.5 K/uL Final   RBC 08/29/2022 4.71  4.22 - 5.81 MIL/uL Final   Hemoglobin 08/29/2022 13.8  13.0 - 17.0 g/dL Final   HCT 14/78/2956 40.5  39.0 - 52.0 % Final   MCV 08/29/2022 86.0   80.0 - 100.0 fL Final   MCH 08/29/2022 29.3  26.0 - 34.0 pg Final   MCHC 08/29/2022 34.1  30.0 - 36.0 g/dL Final   RDW 21/30/8657 13.8  11.5 - 15.5 % Final   Platelets 08/29/2022 212  150 - 400 K/uL Final   nRBC 08/29/2022 0.0  0.0 - 0.2 % Final   Neutrophils Relative % 08/29/2022 48  % Final   Neutro Abs 08/29/2022 3.7  1.7 - 7.7 K/uL Final   Lymphocytes Relative 08/29/2022 40  % Final   Lymphs Abs 08/29/2022 3.0  0.7 - 4.0 K/uL Final   Monocytes Relative 08/29/2022 7  % Final   Monocytes Absolute 08/29/2022 0.5  0.1 - 1.0 K/uL Final   Eosinophils Relative 08/29/2022 4  % Final   Eosinophils Absolute 08/29/2022 0.3  0.0 - 0.5 K/uL Final   Basophils Relative 08/29/2022 1  % Final   Basophils Absolute 08/29/2022 0.1  0.0 - 0.1 K/uL Final   Immature Granulocytes 08/29/2022 0  % Final   Abs Immature Granulocytes 08/29/2022 0.02  0.00 - 0.07 K/uL Final   Performed at [facility] Lab, [address], [place], [place] [address]   Sodium 08/29/2022 135  135 - 145 mmol/L Final   Potassium 08/29/2022 4.0  3.5 - 5.1 mmol/L Final   Chloride 08/29/2022 99  98 - 111 mmol/L Final   CO2 08/29/2022 27  22 - 32 mmol/L Final   Glucose, Bld 08/29/2022 179 (H)  70 - 99 mg/dL Final   Glucose reference range applies only to samples taken after fasting for at least 8 hours.   BUN 08/29/2022 13  6 - 20 mg/dL Final   Creatinine, Ser 08/29/2022 0.99  0.61 - 1.24 mg/dL Final   Calcium 29/52/8413 9.1  8.9 - 10.3 mg/dL Final   Total Protein 24/40/1027 6.6  6.5 - 8.1 g/dL Final   Albumin 25/36/6440 3.6  3.5 - 5.0 g/dL Final   AST 34/74/2595 29  15 - 41 U/L Final   ALT 08/29/2022 34  0 - 44 U/L Final   Alkaline Phosphatase 08/29/2022 51  38 - 126 U/L Final   Total Bilirubin 08/29/2022 0.6  0.3 - 1.2 mg/dL Final   GFR, Estimated 08/29/2022 >60  >60 mL/min Final   Comment: (NOTE) Calculated using the CKD-EPI Creatinine Equation (2021)    Anion gap 08/29/2022 9  5 - 15 Final   Performed at [facility] Lab, [address], [place], [place] [address]   Phosphorus 08/29/2022 4.3  2.5 - 4.6 mg/dL Final   Performed at [facility], [address] [place], [place] [address]   Magnesium 08/29/2022 1.9  1.7 - 2.4 mg/dL Final   Performed at [facility], [address] [place], [place] [address]    Blood Alcohol level:  Lab Results  Component Value Date   ETH <10 12/12/2022   ETH 290 (H) 01/24/2019    Metabolic Disorder Labs: Lab Results  Component Value Date   HGBA1C 7.7 (H) 12/12/2022  MPG 174.29 12/12/2022   MPG 136.98 02/10/2020   Lab Results  Component Value Date   PROLACTIN 7.0 12/12/2022   Lab Results  Component Value Date   CHOL 184 12/12/2022   TRIG 250 (H) 12/12/2022   HDL 37 (L) 12/12/2022   CHOLHDL 5.0 12/12/2022   VLDL 50 (H) 12/12/2022   LDLCALC 97 12/12/2022    Therapeutic Lab Levels: No results found for: "LITHIUM" No results found for: "VALPROATE" No results found for: "CBMZ"  Physical Findings   PHQ2-9    Flowsheet Row ED from 12/12/2022 in [facility]  PHQ-2 Total Score 0      Flowsheet Row ED from 12/12/2022 in [facility] Most recent reading at 12/12/2022  5:26 PM ED from 12/12/2022 in [facility] Most recent reading at 12/12/2022  1:56 PM ED to Hosp-Admission (Discharged) from 08/27/2022 in [facility] GENERAL MED/SURG UNIT Most recent reading at 08/28/2022  1:00 AM  C-SSRS RISK CATEGORY No Risk No Risk No Risk        Musculoskeletal  Strength & Muscle Tone: within normal limits Gait & Station: normal Patient leans: N/A  Psychiatric Specialty Exam  Presentation  General Appearance:  Appropriate for Environment  Eye Contact: Good  Speech: Clear and Coherent; Normal Rate  Speech Volume: Normal  Handedness: Right   Mood and Affect  Mood: Depressed; Anxious  Affect: Congruent   Thought Process  Thought  Processes: Coherent; Goal Directed  Descriptions of Associations:Intact  Orientation:Full (Time, Place and Person)  Thought Content:Logical  Diagnosis of Schizophrenia or Schizoaffective disorder in past: No    Hallucinations:Hallucinations: None  Ideas of Reference:None  Suicidal Thoughts:Suicidal Thoughts: No  Homicidal Thoughts:Homicidal Thoughts: No   Sensorium  Memory: Remote Good  Judgment: Fair  Insight: Fair   Art therapist  Concentration: Good  Attention Span: Good  Recall: Good  Fund of Knowledge: Good  Language: Good   Psychomotor Activity  Psychomotor Activity: Psychomotor Activity: Normal   Assets  Assets: Communication Skills; Desire for Improvement; Housing   Sleep  Sleep: Sleep: Fair Number of Hours of Sleep: 6   Nutritional Assessment (For OBS and FBC admissions only) Has the patient had a weight loss or gain of 10 pounds or more in the last 3 months?: No Has the patient had a decrease in food intake/or appetite?: No Does the patient have dental problems?: No Does the patient have eating habits or behaviors that may be indicators of an eating disorder including binging or inducing vomiting?: No Has the patient recently lost weight without trying?: 0 Has the patient been eating poorly because of a decreased appetite?: 0 Malnutrition Screening Tool Score: 0    Physical Exam  Physical Exam Vitals reviewed.  Constitutional:      Appearance: Normal appearance.  HENT:     Head: Normocephalic and atraumatic.  Cardiovascular:     Rate and Rhythm: Normal rate.  Pulmonary:     Effort: Pulmonary effort is normal.  Neurological:     Mental Status: He is alert.    Review of Systems  Constitutional:  Positive for diaphoresis. Negative for fever.  Gastrointestinal:  Negative for nausea and vomiting.  Musculoskeletal:  Positive for back pain, joint pain and myalgias.  Neurological:  Positive for tremors.   Psychiatric/Behavioral:  The patient has insomnia.    Blood pressure 124/79, pulse 87, temperature 97.9 F (36.6 C), temperature source Oral, resp. rate (!) 22, SpO2 96%. There is no  height or weight on file to calculate BMI.  Treatment Plan Summary: Alcohol Use Disorder Alcohol Withdrawal -Start Neltrexone 50 mg daily -Librium taper -CIWA with Librium as needed for CIWA greater than 10  -Last score was 3 at 6 AM on 9/19 -Thiamine 100 mg PO -Multivitamin with minerals daily -Tylenol 650 mg every 6 hours as needed for pain -Zofran 4 mg every 6 hours as needed for nausea or vomiting -Imodium 2 to 4 mg as needed for diarrhea or loose stools  -Maalox/Mylanta 30 mL every 4 hours as needed for indigestion -Milk of Mag 30 mL as needed for constipation    Tobacco Use Disorder -Start nicotine patch  Restart home meds: albuterol, amlodipine, neurontin, lisinopril   Dispo: pending, CDIOP?  Lance Muss, MD 12/13/2022 11:27 AM

## 2022-12-13 NOTE — Progress Notes (Signed)
Pt is awake, alert and oriented X3. Pt complained of back pain. No signs of acute distress noted. Administered scheduled med per order. Pt denies current SI/HI/AVH, plan or intent. Staff will monitor for pt's safety.

## 2022-12-13 NOTE — ED Notes (Signed)
Pt is in his room resting in bed. Pt denies SI/HI/AVH. No acute distress noted. Will continue to monitor for safety.

## 2022-12-13 NOTE — Progress Notes (Signed)
Pt was visible in the milieu and interacted with peers. Pt complained of anxiety. PRN Hydroxyzine administered per order. Staff will monitor for pt's safety.

## 2022-12-13 NOTE — Group Note (Signed)
Group Topic: Positive Affirmations  Group Date: 12/13/2022 Start Time: 1000 End Time: 1020 Facilitators: Priscille Kluver, NT  Department: [facility]  Number of Participants: 4  Group Focus: affirmation Treatment Modality:  Solution-Focused Therapy Interventions utilized were support Purpose: increase insight  Name: Kristopher [name] Date of Birth: 05-05-1970  MR: [id number]    Level of Participation: Pt did not attend group  Patients Problems:  Patient Active Problem List   Diagnosis Date Noted   Alcohol use disorder, severe, dependence (HCC) 12/12/2022   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

## 2022-12-14 ENCOUNTER — Emergency Department (HOSPITAL_COMMUNITY): Payer: 59

## 2022-12-14 ENCOUNTER — Emergency Department (HOSPITAL_COMMUNITY)
Admission: EM | Admit: 2022-12-14 | Discharge: 2022-12-14 | Disposition: A | Discharge status: Other Acute Inpatient Hospital | Payer: 59 | Attending: Emergency Medicine | Admitting: Emergency Medicine

## 2022-12-14 ENCOUNTER — Other Ambulatory Visit: Payer: Self-pay

## 2022-12-14 ENCOUNTER — Encounter (HOSPITAL_COMMUNITY): Payer: Self-pay

## 2022-12-14 ENCOUNTER — Other Ambulatory Visit (HOSPITAL_COMMUNITY)
Admission: EM | Admit: 2022-12-14 | Discharge: 2022-12-18 | Disposition: A | Discharge status: Home / Self Care | Payer: 59 | Attending: Psychiatry | Admitting: Psychiatry

## 2022-12-14 DIAGNOSIS — M545 Low back pain, unspecified: Secondary | ICD-10-CM | POA: Diagnosis present

## 2022-12-14 DIAGNOSIS — E119 Type 2 diabetes mellitus without complications: Secondary | ICD-10-CM | POA: Diagnosis not present

## 2022-12-14 DIAGNOSIS — F1090 Alcohol use, unspecified, uncomplicated: Secondary | ICD-10-CM

## 2022-12-14 DIAGNOSIS — J449 Chronic obstructive pulmonary disease, unspecified: Secondary | ICD-10-CM | POA: Diagnosis not present

## 2022-12-14 DIAGNOSIS — R29898 Other symptoms and signs involving the musculoskeletal system: Secondary | ICD-10-CM | POA: Insufficient documentation

## 2022-12-14 DIAGNOSIS — F17210 Nicotine dependence, cigarettes, uncomplicated: Secondary | ICD-10-CM | POA: Diagnosis not present

## 2022-12-14 DIAGNOSIS — W06XXXA Fall from bed, initial encounter: Secondary | ICD-10-CM | POA: Insufficient documentation

## 2022-12-14 DIAGNOSIS — I10 Essential (primary) hypertension: Secondary | ICD-10-CM | POA: Insufficient documentation

## 2022-12-14 LAB — COMPREHENSIVE METABOLIC PANEL
ALT: 44 U/L (ref 0–44)
AST: 30 U/L (ref 15–41)
Albumin: 3.3 g/dL — ABNORMAL LOW (ref 3.5–5.0)
Alkaline Phosphatase: 80 U/L (ref 38–126)
Anion gap: 8 (ref 5–15)
BUN: 10 mg/dL (ref 6–20)
CO2: 26 mmol/L (ref 22–32)
Calcium: 8.5 mg/dL — ABNORMAL LOW (ref 8.9–10.3)
Chloride: 99 mmol/L (ref 98–111)
Creatinine, Ser: 0.85 mg/dL (ref 0.61–1.24)
GFR, Estimated: >60 mL/min (ref >60)
Glucose, Bld: 215 mg/dL — ABNORMAL HIGH (ref 70–99)
Potassium: 4 mmol/L (ref 3.5–5.1)
Sodium: 133 mmol/L — ABNORMAL LOW (ref 135–145)
Total Bilirubin: 0.6 mg/dL (ref 0.3–1.2)
Total Protein: 6.5 g/dL (ref 6.5–8.1)

## 2022-12-14 LAB — I-STAT CHEM 8, ED
BUN: 11 mg/dL (ref 6–20)
Calcium, Ion: 1.21 mmol/L (ref 1.15–1.40)
Chloride: 100 mmol/L (ref 98–111)
Creatinine, Ser: 0.7 mg/dL (ref 0.61–1.24)
Glucose, Bld: 217 mg/dL — ABNORMAL HIGH (ref 70–99)
HCT: 42 % (ref 39.0–52.0)
Hemoglobin: 14.3 g/dL (ref 13.0–17.0)
Potassium: 3.9 mmol/L (ref 3.5–5.1)
Sodium: 137 mmol/L (ref 135–145)
TCO2: 24 mmol/L (ref 22–32)

## 2022-12-14 LAB — CBC
HCT: 41.2 % (ref 39.0–52.0)
Hemoglobin: 13.3 g/dL (ref 13.0–17.0)
MCH: 28.5 pg (ref 26.0–34.0)
MCHC: 32.3 g/dL (ref 30.0–36.0)
MCV: 88.2 fL (ref 80.0–100.0)
Platelets: 182 10*3/uL (ref 150–400)
RBC: 4.67 MIL/uL (ref 4.22–5.81)
RDW: 15.1 % (ref 11.5–15.5)
WBC: 7.9 10*3/uL (ref 4.0–10.5)
nRBC: 0 % (ref 0.0–0.2)

## 2022-12-14 LAB — I-STAT CG4 LACTIC ACID, ED: Lactic Acid, Venous: 2 mmol/L (ref 0.5–1.9)

## 2022-12-14 LAB — ETHANOL: Alcohol, Ethyl (B): <10 mg/dL (ref <10)

## 2022-12-14 LAB — SAMPLE TO BLOOD BANK

## 2022-12-14 LAB — PROTIME-INR
INR: 0.9 (ref 0.8–1.2)
Prothrombin Time: 12.7 seconds (ref 11.4–15.2)

## 2022-12-14 MED ORDER — GABAPENTIN 300 MG PO CAPS
300.0000 mg | ORAL_CAPSULE | Freq: Three times a day (TID) | ORAL | Status: DC
  Administered 2022-12-14 – 2022-12-18 (×13): 300 mg via ORAL
  Filled 2022-12-14 (×13): qty 1

## 2022-12-14 MED ORDER — DEXAMETHASONE SODIUM PHOSPHATE 10 MG/ML IJ SOLN
10.0000 mg | Freq: Once | INTRAMUSCULAR | Status: AC
  Administered 2022-12-14: 10 mg via INTRAVENOUS
  Filled 2022-12-14: qty 1

## 2022-12-14 MED ORDER — NICOTINE 14 MG/24HR TD PT24
14.0000 mg | MEDICATED_PATCH | Freq: Every day | TRANSDERMAL | Status: DC
  Administered 2022-12-14 – 2022-12-18 (×5): 14 mg via TRANSDERMAL
  Filled 2022-12-14 (×5): qty 1

## 2022-12-14 MED ORDER — HYDROMORPHONE HCL 1 MG/ML IJ SOLN
1.0000 mg | Freq: Once | INTRAMUSCULAR | Status: AC
  Administered 2022-12-14: 1 mg via INTRAVENOUS
  Filled 2022-12-14: qty 1

## 2022-12-14 MED ORDER — TRAZODONE HCL 50 MG PO TABS
50.0000 mg | ORAL_TABLET | Freq: Every evening | ORAL | Status: DC | PRN
  Administered 2022-12-14 – 2022-12-17 (×4): 50 mg via ORAL
  Filled 2022-12-14 (×4): qty 1

## 2022-12-14 MED ORDER — ACETAMINOPHEN 325 MG PO TABS
650.0000 mg | ORAL_TABLET | Freq: Four times a day (QID) | ORAL | Status: DC | PRN
  Administered 2022-12-15 – 2022-12-17 (×10): 650 mg via ORAL
  Filled 2022-12-14 (×10): qty 2

## 2022-12-14 MED ORDER — LOPERAMIDE HCL 2 MG PO CAPS: 2.0000 mg | ORAL_CAPSULE | ORAL | Status: AC | PRN

## 2022-12-14 MED ORDER — THIAMINE MONONITRATE 100 MG PO TABS
100.0000 mg | ORAL_TABLET | Freq: Every day | ORAL | Status: DC
  Administered 2022-12-15 – 2022-12-18 (×4): 100 mg via ORAL
  Filled 2022-12-14 (×4): qty 1

## 2022-12-14 MED ORDER — AMLODIPINE BESYLATE 10 MG PO TABS
10.0000 mg | ORAL_TABLET | Freq: Every day | ORAL | Status: DC
  Administered 2022-12-14 – 2022-12-18 (×5): 10 mg via ORAL
  Filled 2022-12-14 (×5): qty 1

## 2022-12-14 MED ORDER — HYDROXYZINE HCL 25 MG PO TABS
25.0000 mg | ORAL_TABLET | Freq: Four times a day (QID) | ORAL | Status: AC | PRN
  Administered 2022-12-15 – 2022-12-17 (×8): 25 mg via ORAL
  Filled 2022-12-14 (×9): qty 1

## 2022-12-14 MED ORDER — ALBUTEROL SULFATE HFA 108 (90 BASE) MCG/ACT IN AERS: 1.0000 | INHALATION_SPRAY | Freq: Four times a day (QID) | RESPIRATORY_TRACT | Status: DC | PRN

## 2022-12-14 MED ORDER — ADULT MULTIVITAMIN W/MINERALS CH
1.0000 | ORAL_TABLET | Freq: Every day | ORAL | Status: DC
  Administered 2022-12-14 – 2022-12-18 (×5): 1 via ORAL
  Filled 2022-12-14 (×5): qty 1

## 2022-12-14 MED ORDER — ONDANSETRON 4 MG PO TBDP: 4.0000 mg | ORAL_TABLET | Freq: Four times a day (QID) | ORAL | Status: AC | PRN

## 2022-12-14 MED ORDER — LORAZEPAM 2 MG/ML IJ SOLN
2.0000 mg | Freq: Once | INTRAMUSCULAR | Status: AC | PRN
  Administered 2022-12-14: 2 mg via INTRAVENOUS
  Filled 2022-12-14: qty 1

## 2022-12-14 MED ORDER — LISINOPRIL 5 MG PO TABS
5.0000 mg | ORAL_TABLET | Freq: Every day | ORAL | Status: DC
  Administered 2022-12-14 – 2022-12-18 (×5): 5 mg via ORAL
  Filled 2022-12-14 (×5): qty 1

## 2022-12-14 MED ORDER — OXYCODONE HCL 5 MG PO TABS
5.0000 mg | ORAL_TABLET | Freq: Once | ORAL | Status: AC
  Administered 2022-12-14: 5 mg via ORAL
  Filled 2022-12-14: qty 1

## 2022-12-14 NOTE — ED Notes (Signed)
Dr Pilar Plate at bedside.

## 2022-12-14 NOTE — ED Provider Notes (Signed)
Behavioral Health Progress Note  Date and Time: 12/14/2022 11:28 AM Name: CORDON [name] MRN:  [id number]  Subjective:  Kristopher [name] is a 52 y.o. male patient presented to [facility] with complaints of alcohol abuse and requesting assistance with detox.   Overnight, patient went to ED due to a fall from his bed. He was medically cleared and returned to the [facility]. This morning, patient felt very tired. He reports fair sleep and fair appetite. He denies SI, HI, and AVH. There is a concern that patient is med seeking as he received pain medications in the ED for back pain and yesterday he was asking for pain medications after discussing other options like increase gabapentin and adding Tylenol. He continues to request CDIOP services.  Diagnosis:  Final diagnoses:  Alcohol use disorder    Total Time spent with patient: 30 minutes  Past Psychiatric History: AUD   Past Medical History: HTN, COPD Family History: DM Social History: lives with cousin and cousin's husband in [place], previously works as a Naval architect but recently unemployed, support system is his cousin  Additional Social History:                         Sleep: Fair  Appetite:  Fair  Current Medications:  No current facility-administered medications for this encounter.   Current Outpatient Medications  Medication Sig Dispense Refill   albuterol (VENTOLIN HFA) 108 (90 Base) MCG/ACT inhaler Inhale 1-2 puffs into the lungs every 6 (six) hours as needed for wheezing or shortness of breath. 8 g 2   amLODipine (NORVASC) 10 MG tablet Take 1 tablet (10 mg total) by mouth daily. 30 tablet 0   gabapentin (NEURONTIN) 300 MG capsule Take 1 capsule (300 mg total) by mouth 3 (three) times daily. 90 capsule 0   lisinopril (ZESTRIL) 5 MG tablet Take 1 tablet (5 mg total) by mouth daily. 30 tablet 0   methocarbamol (ROBAXIN) 750 MG tablet Take 1 tablet (750 mg total) by mouth 3 (three) times daily. (Patient taking differently: Take  750 mg by mouth 3 (three) times daily as needed for muscle spasms.) 20 tablet 0   oxyCODONE (OXY IR/ROXICODONE) 5 MG immediate release tablet Take 5 mg by mouth every 6 (six) hours as needed for moderate pain.      Labs  Lab Results:  Admission on 12/14/2022, Discharged on 12/14/2022  Component Date Value Ref Range Status   Sodium 12/14/2022 133 (L)  135 - 145 mmol/L Final   Potassium 12/14/2022 4.0  3.5 - 5.1 mmol/L Final   Chloride 12/14/2022 99  98 - 111 mmol/L Final   CO2 12/14/2022 26  22 - 32 mmol/L Final   Glucose, Bld 12/14/2022 215 (H)  70 - 99 mg/dL Final   Glucose reference range applies only to samples taken after fasting for at least 8 hours.   BUN 12/14/2022 10  6 - 20 mg/dL Final   Creatinine, Ser 12/14/2022 0.85  0.61 - 1.24 mg/dL Final   Calcium 08/65/7846 8.5 (L)  8.9 - 10.3 mg/dL Final   Total Protein 96/29/5284 6.5  6.5 - 8.1 g/dL Final   Albumin 13/24/4010 3.3 (L)  3.5 - 5.0 g/dL Final   AST 27/25/3664 30  15 - 41 U/L Final   ALT 12/14/2022 44  0 - 44 U/L Final   Alkaline Phosphatase 12/14/2022 80  38 - 126 U/L Final   Total Bilirubin 12/14/2022 0.6  0.3 - 1.2 mg/dL Final  GFR, Estimated 12/14/2022 >60  >60 mL/min Final   Comment: (NOTE) Calculated using the CKD-EPI Creatinine Equation (2021)    Anion gap 12/14/2022 8  5 - 15 Final   Performed at [facility], [address], [place], [place] [address]   Sodium 12/14/2022 137  135 - 145 mmol/L Final   Potassium 12/14/2022 3.9  3.5 - 5.1 mmol/L Final   Chloride 12/14/2022 100  98 - 111 mmol/L Final   BUN 12/14/2022 11  6 - 20 mg/dL Final   Creatinine, Ser 12/14/2022 0.70  0.61 - 1.24 mg/dL Final   Glucose, Bld 11/91/4782 217 (H)  70 - 99 mg/dL Final   Glucose reference range applies only to samples taken after fasting for at least 8 hours.   Calcium, Ion 12/14/2022 1.21  1.15 - 1.40 mmol/L Final   TCO2 12/14/2022 24  22 - 32 mmol/L Final   Hemoglobin 12/14/2022 14.3  13.0 - 17.0 g/dL Final   HCT  95/62/1308 42.0  39.0 - 52.0 % Final   WBC 12/14/2022 7.9  4.0 - 10.5 K/uL Final   RBC 12/14/2022 4.67  4.22 - 5.81 MIL/uL Final   Hemoglobin 12/14/2022 13.3  13.0 - 17.0 g/dL Final   HCT 65/78/4696 41.2  39.0 - 52.0 % Final   MCV 12/14/2022 88.2  80.0 - 100.0 fL Final   MCH 12/14/2022 28.5  26.0 - 34.0 pg Final   MCHC 12/14/2022 32.3  30.0 - 36.0 g/dL Final   RDW 29/52/8413 15.1  11.5 - 15.5 % Final   Platelets 12/14/2022 182  150 - 400 K/uL Final   nRBC 12/14/2022 0.0  0.0 - 0.2 % Final   Performed at [facility], [address] [place], [place] [address]   Alcohol, Ethyl (B) 12/14/2022 <10  <10 mg/dL Final   Comment: (NOTE) Lowest detectable limit for serum alcohol is 10 mg/dL.  For medical purposes only. Performed at [facility], [address], [place], [place] [address]    Lactic Acid, Venous 12/14/2022 2.0 (HH)  0.5 - 1.9 mmol/L Final   Prothrombin Time 12/14/2022 12.7  11.4 - 15.2 seconds Final   INR 12/14/2022 0.9  0.8 - 1.2 Final   Comment: (NOTE) INR goal varies based on device and disease states. Performed at [facility], [address] [place], [place] [address]    Blood Bank Specimen 12/14/2022 SAMPLE AVAILABLE FOR TESTING   Final   Sample Expiration 12/14/2022    Final                   Value:12/17/2022,2359 Performed at [facility], [address] [place], [place] [address]   Admission on 12/12/2022, Discharged on 12/12/2022  Component Date Value Ref Range Status   WBC 12/12/2022 8.1  4.0 - 10.5 K/uL Final   RBC 12/12/2022 5.00  4.22 - 5.81 MIL/uL Final   Hemoglobin 12/12/2022 14.3  13.0 - 17.0 g/dL Final   HCT 25/95/6387 43.0  39.0 - 52.0 % Final   MCV 12/12/2022 86.0  80.0 - 100.0 fL Final   MCH 12/12/2022 28.6  26.0 - 34.0 pg Final   MCHC 12/12/2022 33.3  30.0 - 36.0 g/dL Final   RDW 56/43/3295 14.8  11.5 - 15.5 % Final   Platelets 12/12/2022 200  150 - 400 K/uL Final   nRBC 12/12/2022 0.0  0.0 - 0.2 % Final   Neutrophils  Relative % 12/12/2022 64  % Final  Neutro Abs 12/12/2022 5.3  1.7 - 7.7 K/uL Final   Lymphocytes Relative 12/12/2022 27  % Final   Lymphs Abs 12/12/2022 2.2  0.7 - 4.0 K/uL Final   Monocytes Relative 12/12/2022 5  % Final   Monocytes Absolute 12/12/2022 0.4  0.1 - 1.0 K/uL Final   Eosinophils Relative 12/12/2022 2  % Final   Eosinophils Absolute 12/12/2022 0.1  0.0 - 0.5 K/uL Final   Basophils Relative 12/12/2022 1  % Final   Basophils Absolute 12/12/2022 0.0  0.0 - 0.1 K/uL Final   Immature Granulocytes 12/12/2022 1  % Final   Abs Immature Granulocytes 12/12/2022 0.04  0.00 - 0.07 K/uL Final   Performed at [facility], [address] [place], [place] [address]   Sodium 12/12/2022 136  135 - 145 mmol/L Final   Potassium 12/12/2022 4.0  3.5 - 5.1 mmol/L Final   Chloride 12/12/2022 94 (L)  98 - 111 mmol/L Final   CO2 12/12/2022 27  22 - 32 mmol/L Final   Glucose, Bld 12/12/2022 141 (H)  70 - 99 mg/dL Final   Glucose reference range applies only to samples taken after fasting for at least 8 hours.   BUN 12/12/2022 6  6 - 20 mg/dL Final   Creatinine, Ser 12/12/2022 0.73  0.61 - 1.24 mg/dL Final   Calcium 60/45/4098 9.2  8.9 - 10.3 mg/dL Final   Total Protein 11/91/4782 6.8  6.5 - 8.1 g/dL Final   Albumin 95/62/1308 3.6  3.5 - 5.0 g/dL Final   AST 65/78/4696 40  15 - 41 U/L Final   ALT 12/12/2022 59 (H)  0 - 44 U/L Final   Alkaline Phosphatase 12/12/2022 86  38 - 126 U/L Final   Total Bilirubin 12/12/2022 0.6  0.3 - 1.2 mg/dL Final   GFR, Estimated 12/12/2022 >60  >60 mL/min Final   Comment: (NOTE) Calculated using the CKD-EPI Creatinine Equation (2021)    Anion gap 12/12/2022 15  5 - 15 Final   Performed at [facility], [address] [place], [place] [address]   Hgb A1c MFr Bld 12/12/2022 7.7 (H)  4.8 - 5.6 % Final   Comment: (NOTE) Pre diabetes:          5.7%-6.4%  Diabetes:              >6.4%  Glycemic control for   <7.0% adults with diabetes    Mean Plasma  Glucose 12/12/2022 174.29  mg/dL Final   Performed at [facility], [address] [place], [place] [address]   Magnesium 12/12/2022 1.7  1.7 - 2.4 mg/dL Final   Performed at [facility], [address], [place], [place] [address]   Alcohol, Ethyl (B) 12/12/2022 <10  <10 mg/dL Final   Comment: (NOTE) Lowest detectable limit for serum alcohol is 10 mg/dL.  For medical purposes only. Performed at [facility], [address] [place], [place] [address]    Cholesterol 12/12/2022 184  0 - 200 mg/dL Final   Triglycerides 64/40/3474 250 (H)  <150 mg/dL Final   HDL 25/95/6387 37 (L)  >40 mg/dL Final   Total CHOL/HDL Ratio 12/12/2022 5.0  RATIO Final   VLDL 12/12/2022 50 (H)  0 - 40 mg/dL Final   LDL Cholesterol 12/12/2022 97  0 - 99 mg/dL Final   Comment:        Total Cholesterol/HDL:CHD Risk Coronary Heart Disease Risk Table  Men   Women  1/2 Average Risk   3.4   3.3  Average Risk       5.0   4.4  2 X Average Risk   9.6   7.1  3 X Average Risk  23.4   11.0        Use the calculated Patient Ratio above and the CHD Risk Table to determine the patient's CHD Risk.        ATP III CLASSIFICATION (LDL):  <100     mg/dL   Optimal  161-096  mg/dL   Near or Above                    Optimal  130-159  mg/dL   Borderline  045-409  mg/dL   High  >811     mg/dL   Very High Performed at [facility], [address] [place], [place] [address]    TSH 12/12/2022 3.275  0.350 - 4.500 uIU/mL Final   Comment: Performed by a 3rd Generation assay with a functional sensitivity of <=0.01 uIU/mL. Performed at [facility], [address], [place], [place] [address]    Prolactin 12/12/2022 7.0  3.6 - 25.2 ng/mL Final   Comment: (NOTE) Performed At: [facility] [address] [place], [place] [id number] Jolene Schimke MD ON:[id number]    Color, Urine 12/12/2022 YELLOW  YELLOW Final   APPearance 12/12/2022 CLEAR  CLEAR Final   Specific  Gravity, Urine 12/12/2022 1.015  1.005 - 1.030 Final   pH 12/12/2022 7.0  5.0 - 8.0 Final   Glucose, UA 12/12/2022 NEGATIVE  NEGATIVE mg/dL Final   Hgb urine dipstick 12/12/2022 NEGATIVE  NEGATIVE Final   Bilirubin Urine 12/12/2022 NEGATIVE  NEGATIVE Final   Ketones, ur 12/12/2022 NEGATIVE  NEGATIVE mg/dL Final   Protein, ur 44/03/270 NEGATIVE  NEGATIVE mg/dL Final   Nitrite 53/66/4403 NEGATIVE  NEGATIVE Final   Leukocytes,Ua 12/12/2022 NEGATIVE  NEGATIVE Final   Performed at [facility], [address], [place], [place] [address]   POC Amphetamine UR 12/12/2022 None Detected  NONE DETECTED (Cut Off Level 1000 ng/mL) Final   POC Secobarbital (BAR) 12/12/2022 None Detected  NONE DETECTED (Cut Off Level 300 ng/mL) Final   POC Buprenorphine (BUP) 12/12/2022 None Detected  NONE DETECTED (Cut Off Level 10 ng/mL) Final   POC Oxazepam (BZO) 12/12/2022 None Detected  NONE DETECTED (Cut Off Level 300 ng/mL) Final   POC Cocaine UR 12/12/2022 None Detected  NONE DETECTED (Cut Off Level 300 ng/mL) Final   POC Methamphetamine UR 12/12/2022 None Detected  NONE DETECTED (Cut Off Level 1000 ng/mL) Final   POC Morphine 12/12/2022 None Detected  NONE DETECTED (Cut Off Level 300 ng/mL) Final   POC Methadone UR 12/12/2022 None Detected  NONE DETECTED (Cut Off Level 300 ng/mL) Final   POC Oxycodone UR 12/12/2022 None Detected  NONE DETECTED (Cut Off Level 100 ng/mL) Final   POC Marijuana UR 12/12/2022 None Detected  NONE DETECTED (Cut Off Level 50 ng/mL) Final  Admission on 08/27/2022, Discharged on 08/31/2022  Component Date Value Ref Range Status   WBC 08/27/2022 7.6  4.0 - 10.5 K/uL Final   RBC 08/27/2022 5.45  4.22 - 5.81 MIL/uL Final   Hemoglobin 08/27/2022 15.6  13.0 - 17.0 g/dL Final   HCT 95/63/8756 46.1  39.0 - 52.0 % Final   MCV 08/27/2022 84.6  80.0 - 100.0 fL Final   MCH 08/27/2022 28.6  26.0 - 34.0  pg Final   MCHC 08/27/2022 33.8  30.0 - 36.0 g/dL Final   RDW 78/29/5621 13.9  11.5 - 15.5  % Final   Platelets 08/27/2022 221  150 - 400 K/uL Final   nRBC 08/27/2022 0.0  0.0 - 0.2 % Final   Neutrophils Relative % 08/27/2022 60  % Final   Neutro Abs 08/27/2022 4.6  1.7 - 7.7 K/uL Final   Lymphocytes Relative 08/27/2022 32  % Final   Lymphs Abs 08/27/2022 2.5  0.7 - 4.0 K/uL Final   Monocytes Relative 08/27/2022 5  % Final   Monocytes Absolute 08/27/2022 0.4  0.1 - 1.0 K/uL Final   Eosinophils Relative 08/27/2022 2  % Final   Eosinophils Absolute 08/27/2022 0.1  0.0 - 0.5 K/uL Final   Basophils Relative 08/27/2022 1  % Final   Basophils Absolute 08/27/2022 0.1  0.0 - 0.1 K/uL Final   Immature Granulocytes 08/27/2022 0  % Final   Abs Immature Granulocytes 08/27/2022 0.02  0.00 - 0.07 K/uL Final   Performed at [facility], [address] [place], [place] [address]   Sodium 08/27/2022 133 (L)  135 - 145 mmol/L Final   Potassium 08/27/2022 3.9  3.5 - 5.1 mmol/L Final   Chloride 08/27/2022 100  98 - 111 mmol/L Final   CO2 08/27/2022 24  22 - 32 mmol/L Final   Glucose, Bld 08/27/2022 142 (H)  70 - 99 mg/dL Final   Glucose reference range applies only to samples taken after fasting for at least 8 hours.   BUN 08/27/2022 8  6 - 20 mg/dL Final   Creatinine, Ser 08/27/2022 0.80  0.61 - 1.24 mg/dL Final   Calcium 78/46/9629 9.0  8.9 - 10.3 mg/dL Final   Total Protein 52/84/1324 7.4  6.5 - 8.1 g/dL Final   Albumin 40/12/2723 3.9  3.5 - 5.0 g/dL Final   AST 36/64/4034 26  15 - 41 U/L Final   ALT 08/27/2022 28  0 - 44 U/L Final   Alkaline Phosphatase 08/27/2022 48  38 - 126 U/L Final   Total Bilirubin 08/27/2022 0.7  0.3 - 1.2 mg/dL Final   GFR, Estimated 08/27/2022 >60  >60 mL/min Final   Comment: (NOTE) Calculated using the CKD-EPI Creatinine Equation (2021)    Anion gap 08/27/2022 9  5 - 15 Final   Performed at [facility], [address] [place], [place] [address]   Specimen Source 08/27/2022 URINE, CLEAN CATCH   Final   Color, Urine 08/27/2022 YELLOW  YELLOW  Final   APPearance 08/27/2022 CLEAR  CLEAR Final   Specific Gravity, Urine 08/27/2022 1.011  1.005 - 1.030 Final   pH 08/27/2022 5.0  5.0 - 8.0 Final   Glucose, UA 08/27/2022 NEGATIVE  NEGATIVE mg/dL Final   Hgb urine dipstick 08/27/2022 NEGATIVE  NEGATIVE Final   Bilirubin Urine 08/27/2022 NEGATIVE  NEGATIVE Final   Ketones, ur 08/27/2022 NEGATIVE  NEGATIVE mg/dL Final   Protein, ur 56/38/7564 NEGATIVE  NEGATIVE mg/dL Final   Nitrite 33/29/5188 NEGATIVE  NEGATIVE Final   Leukocytes,Ua 08/27/2022 NEGATIVE  NEGATIVE Final   RBC / HPF 08/27/2022 0-5  0 - 5 RBC/hpf Final   WBC, UA 08/27/2022 0-5  0 - 5 WBC/hpf Final   Comment:        Reflex urine culture not performed if WBC <=10, OR if Squamous epithelial cells >5. If Squamous epithelial cells >5 suggest recollection.    Bacteria, UA 08/27/2022 NONE SEEN  NONE SEEN Final  Squamous Epithelial / HPF 08/27/2022 0-5  0 - 5 /HPF Final   Mucus 08/27/2022 PRESENT   Final   Performed at [facility], [address] [place], [place] [address]   Opiates 08/27/2022 POSITIVE (A)  NONE DETECTED Final   Cocaine 08/27/2022 NONE DETECTED  NONE DETECTED Final   Benzodiazepines 08/27/2022 NONE DETECTED  NONE DETECTED Final   Amphetamines 08/27/2022 NONE DETECTED  NONE DETECTED Final   Tetrahydrocannabinol 08/27/2022 NONE DETECTED  NONE DETECTED Final   Barbiturates 08/27/2022 NONE DETECTED  NONE DETECTED Final   Comment: (NOTE) DRUG SCREEN FOR MEDICAL PURPOSES ONLY.  IF CONFIRMATION IS NEEDED FOR ANY PURPOSE, NOTIFY LAB WITHIN 5 DAYS.  LOWEST DETECTABLE LIMITS FOR URINE DRUG SCREEN Drug Class                     Cutoff (ng/mL) Amphetamine and metabolites    1000 Barbiturate and metabolites    200 Benzodiazepine                 200 Opiates and metabolites        300 Cocaine and metabolites        300 THC                            50 Performed at [facility], [address], [place], [place] [address]    Magnesium 08/27/2022  1.9  1.7 - 2.4 mg/dL Final   Performed at [facility], [address], [place], [place] [address]   WBC 08/28/2022 9.0  4.0 - 10.5 K/uL Final   RBC 08/28/2022 5.27  4.22 - 5.81 MIL/uL Final   Hemoglobin 08/28/2022 14.6  13.0 - 17.0 g/dL Final   HCT 13/10/6576 44.5  39.0 - 52.0 % Final   MCV 08/28/2022 84.4  80.0 - 100.0 fL Final   MCH 08/28/2022 27.7  26.0 - 34.0 pg Final   MCHC 08/28/2022 32.8  30.0 - 36.0 g/dL Final   RDW 46/96/2952 14.0  11.5 - 15.5 % Final   Platelets 08/28/2022 242  150 - 400 K/uL Final   nRBC 08/28/2022 0.0  0.0 - 0.2 % Final   Neutrophils Relative % 08/28/2022 66  % Final   Neutro Abs 08/28/2022 6.0  1.7 - 7.7 K/uL Final   Lymphocytes Relative 08/28/2022 26  % Final   Lymphs Abs 08/28/2022 2.3  0.7 - 4.0 K/uL Final   Monocytes Relative 08/28/2022 6  % Final   Monocytes Absolute 08/28/2022 0.5  0.1 - 1.0 K/uL Final   Eosinophils Relative 08/28/2022 1  % Final   Eosinophils Absolute 08/28/2022 0.1  0.0 - 0.5 K/uL Final   Basophils Relative 08/28/2022 1  % Final   Basophils Absolute 08/28/2022 0.1  0.0 - 0.1 K/uL Final   Immature Granulocytes 08/28/2022 0  % Final   Abs Immature Granulocytes 08/28/2022 0.03  0.00 - 0.07 K/uL Final   Performed at [facility], [address] [place], [place] [address]   Sodium 08/28/2022 133 (L)  135 - 145 mmol/L Final   Potassium 08/28/2022 3.8  3.5 - 5.1 mmol/L Final   Chloride 08/28/2022 99  98 - 111 mmol/L Final   CO2 08/28/2022 26  22 - 32 mmol/L Final   Glucose, Bld 08/28/2022 107 (H)  70 - 99 mg/dL Final   Glucose reference range applies only to samples taken after fasting for at least  8 hours.   BUN 08/28/2022 14  6 - 20 mg/dL Final   Creatinine, Ser 08/28/2022 0.85  0.61 - 1.24 mg/dL Final   Calcium 40/98/1191 9.0  8.9 - 10.3 mg/dL Final   Total Protein 47/82/9562 6.7  6.5 - 8.1 g/dL Final   Albumin 13/10/6576 3.8  3.5 - 5.0 g/dL Final   AST 46/96/2952 31  15 - 41 U/L Final   ALT 08/28/2022 34  0 - 44  U/L Final   Alkaline Phosphatase 08/28/2022 52  38 - 126 U/L Final   Total Bilirubin 08/28/2022 0.6  0.3 - 1.2 mg/dL Final   GFR, Estimated 08/28/2022 >60  >60 mL/min Final   Comment: (NOTE) Calculated using the CKD-EPI Creatinine Equation (2021)    Anion gap 08/28/2022 8  5 - 15 Final   Performed at [facility], [address] [place], [place] [address]   Magnesium 08/28/2022 2.0  1.7 - 2.4 mg/dL Final   Performed at [facility], [address], [place], [place] [address]   Phosphorus 08/28/2022 4.3  2.5 - 4.6 mg/dL Final   Performed at [facility], [address] [place], [place] [address]   WBC 08/29/2022 7.5  4.0 - 10.5 K/uL Final   RBC 08/29/2022 4.71  4.22 - 5.81 MIL/uL Final   Hemoglobin 08/29/2022 13.8  13.0 - 17.0 g/dL Final   HCT 66/44/0347 40.5  39.0 - 52.0 % Final   MCV 08/29/2022 86.0  80.0 - 100.0 fL Final   MCH 08/29/2022 29.3  26.0 - 34.0 pg Final   MCHC 08/29/2022 34.1  30.0 - 36.0 g/dL Final   RDW 42/59/5638 13.8  11.5 - 15.5 % Final   Platelets 08/29/2022 212  150 - 400 K/uL Final   nRBC 08/29/2022 0.0  0.0 - 0.2 % Final   Neutrophils Relative % 08/29/2022 48  % Final   Neutro Abs 08/29/2022 3.7  1.7 - 7.7 K/uL Final   Lymphocytes Relative 08/29/2022 40  % Final   Lymphs Abs 08/29/2022 3.0  0.7 - 4.0 K/uL Final   Monocytes Relative 08/29/2022 7  % Final   Monocytes Absolute 08/29/2022 0.5  0.1 - 1.0 K/uL Final   Eosinophils Relative 08/29/2022 4  % Final   Eosinophils Absolute 08/29/2022 0.3  0.0 - 0.5 K/uL Final   Basophils Relative 08/29/2022 1  % Final   Basophils Absolute 08/29/2022 0.1  0.0 - 0.1 K/uL Final   Immature Granulocytes 08/29/2022 0  % Final   Abs Immature Granulocytes 08/29/2022 0.02  0.00 - 0.07 K/uL Final   Performed at [facility], [address] [place], [place] [address]   Sodium 08/29/2022 135  135 - 145 mmol/L Final   Potassium 08/29/2022 4.0  3.5 - 5.1 mmol/L Final   Chloride 08/29/2022 99  98 - 111  mmol/L Final   CO2 08/29/2022 27  22 - 32 mmol/L Final   Glucose, Bld 08/29/2022 179 (H)  70 - 99 mg/dL Final   Glucose reference range applies only to samples taken after fasting for at least 8 hours.   BUN 08/29/2022 13  6 - 20 mg/dL Final   Creatinine, Ser 08/29/2022 0.99  0.61 - 1.24 mg/dL Final   Calcium 32/95/1884 9.1  8.9 - 10.3 mg/dL Final   Total Protein 16/60/6301 6.6  6.5 - 8.1 g/dL Final   Albumin 60/12/9321 3.6  3.5 - 5.0 g/dL Final   AST 55/73/2202 29  15 - 41  U/L Final   ALT 08/29/2022 34  0 - 44 U/L Final   Alkaline Phosphatase 08/29/2022 51  38 - 126 U/L Final   Total Bilirubin 08/29/2022 0.6  0.3 - 1.2 mg/dL Final   GFR, Estimated 08/29/2022 >60  >60 mL/min Final   Comment: (NOTE) Calculated using the CKD-EPI Creatinine Equation (2021)    Anion gap 08/29/2022 9  5 - 15 Final   Performed at [facility], [address], [place], [place] [address]   Phosphorus 08/29/2022 4.3  2.5 - 4.6 mg/dL Final   Performed at [facility], [address], [place], [place] [address]   Magnesium 08/29/2022 1.9  1.7 - 2.4 mg/dL Final   Performed at [facility], [address] [place], [place] [address]    Blood Alcohol level:  Lab Results  Component Value Date   [facility] <10 12/14/2022   ETH <10 12/12/2022    Metabolic Disorder Labs: Lab Results  Component Value Date   HGBA1C 7.7 (H) 12/12/2022   MPG 174.29 12/12/2022   MPG 136.98 02/10/2020   Lab Results  Component Value Date   PROLACTIN 7.0 12/12/2022   Lab Results  Component Value Date   CHOL 184 12/12/2022   TRIG 250 (H) 12/12/2022   HDL 37 (L) 12/12/2022   CHOLHDL 5.0 12/12/2022   VLDL 50 (H) 12/12/2022   LDLCALC 97 12/12/2022    Therapeutic Lab Levels: No results found for: "LITHIUM" No results found for: "VALPROATE" No results found for: "CBMZ"  Physical Findings   PHQ2-9    Flowsheet Row ED from 12/14/2022 in [facility] ED from 12/12/2022 in [facility]  PHQ-2 Total Score 0 0      Flowsheet Row ED from 12/14/2022 in [facility] Most recent reading at 12/14/2022  9:31 AM ED from 12/14/2022 in [facility] Emergency Department at [facility] Most recent reading at 12/14/2022 12:40 AM ED from 12/12/2022 in [facility] Most recent reading at 12/12/2022  5:26 PM  C-SSRS RISK CATEGORY No Risk No Risk No Risk        Musculoskeletal  Strength & Muscle Tone: within normal limits Gait & Station: normal Patient leans: N/A  Psychiatric Specialty Exam  Presentation  General Appearance:  Appropriate for Environment  Eye Contact: Minimal  Speech: Clear and Coherent; Normal Rate  Speech Volume: Normal  Handedness: Right   Mood and Affect  Mood: Depressed  Affect: Congruent   Thought Process  Thought Processes: Coherent; Linear  Descriptions of Associations:Intact  Orientation:Full (Time, Place and Person)  Thought Content:Logical  Diagnosis of Schizophrenia or Schizoaffective disorder in past: No    Hallucinations:Hallucinations: None  Ideas of Reference:None  Suicidal Thoughts:Suicidal Thoughts: No  Homicidal Thoughts:Homicidal Thoughts: No   Sensorium  Memory: Remote Good  Judgment: Impaired  Insight: Fair   Art therapist  Concentration: Good  Attention Span: Good  Recall: Good  Fund of Knowledge: Good  Language: Good   Psychomotor Activity  Psychomotor Activity: Psychomotor Activity: Normal   Assets  Assets: Communication Skills; Desire for Improvement; Housing   Sleep  Sleep: Sleep: Fair   Nutritional Assessment (For [facility] and [facility] admissions only) Has the patient had a weight loss or gain of 10 pounds or more in the last 3 months?: No Has the patient had a decrease in food intake/or appetite?: No Does the patient have dental problems?: No Does the patient have  eating habits  or behaviors that may be indicators of an eating disorder including binging or inducing vomiting?: No Has the patient recently lost weight without trying?: 0 Has the patient been eating poorly because of a decreased appetite?: 0 Malnutrition Screening Tool Score: 0    Physical Exam  Physical Exam Vitals reviewed.  Constitutional:      Appearance: Normal appearance.  HENT:     Head: Normocephalic and atraumatic.  Cardiovascular:     Rate and Rhythm: Normal rate.  Pulmonary:     Effort: Pulmonary effort is normal.  Neurological:     Mental Status: He is alert.    Review of Systems  Constitutional:  Negative for diaphoresis and fever.  Gastrointestinal:  Negative for nausea and vomiting.  Musculoskeletal:  Positive for myalgias.  Neurological:  Negative for tremors.   Blood pressure 129/74, pulse 94, temperature 98.3 F (36.8 C), temperature source Oral, resp. rate 18, SpO2 95%. There is no height or weight on file to calculate BMI.  Treatment Plan Summary: Alcohol Use Disorder Alcohol Withdrawal Unfortunately, patient received opioids when he went to the ED so we will NOT continue Naltrexone. -Thiamine 100 mg PO -Multivitamin with minerals daily -Tylenol 650 mg every 6 hours as needed for pain -Zofran 4 mg every 6 hours as needed for nausea or vomiting -Imodium 2 to 4 mg as needed for diarrhea or loose stools    Tobacco Use Disorder -Nicotine patch  Restart home meds: albuterol, amlodipine, neurontin, lisinopril   Dispo: pending, CDIOP?  Lance Muss, MD 12/14/2022 11:28 AM

## 2022-12-14 NOTE — ED Triage Notes (Signed)
Pt at [facility] for alcohol detox. Patient states his legs gave out and he fell back and hit his head. He arrives in a C-collar and is unable to feel sensation in his legs from the navel down. He is able to move upper extremities. He has a hx of neuropathy and is on Gabapentin for that. He also states he takes oxycodone. He describes the pain as 10/10 pain and a burning sensation in the right leg, and back and neck pain. He has an 18G IV 100 mcg of Fentanyl given by EMS. He is alert and oriented x4. He is not anticoagulated and states he did not lose consciousness.

## 2022-12-14 NOTE — Group Note (Signed)
Group Topic: Wellness  Group Date: 12/14/2022 Start Time: 1000 End Time: 1030 Facilitators: Priscille Kluver, NT  Department: [facility]  Number of Participants: 4  Group Focus: goals/reality orientation Treatment Modality:  Skills Training Interventions utilized were support Purpose: reinforce self-care  Name: Kristopher [name] Date of Birth: [birth year]  MR: [id number]    Level of Participation: Pt did not attend group.   Patients Problems:  Patient Active Problem List   Diagnosis Date Noted   Alcohol use disorder, severe, dependence (HCC) 12/12/2022   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

## 2022-12-14 NOTE — ED Provider Notes (Signed)
Patient's MRIs are unremarkable.  Discussing with the [facility] Joaquin Courts and Kizzie Ide), he is excepted back for evaluation of his alcohol problems.  Seems medically stable for transfer.  He will be given a dose of oxycodone as he has been taking this at home but otherwise discussed that we cannot refill his prescription in the emergency department.   Pricilla Loveless, MD 12/14/22 [phone]

## 2022-12-14 NOTE — Progress Notes (Signed)
Patient in bedroom, respirations even and unlabored. No acute distress noted. No concerns voiced. Patient spent most of day in bedroom but interacted regularly with staff and peers. Actively participated in relaxation group. Informed patient to notify staff with any needs or assistance. Patient verbalized understanding or agreement. Safety checks in place per facility policy. Ajmal Kathan, RN [phone] 12/14/2022

## 2022-12-14 NOTE — Progress Notes (Signed)
SPIRITUALITY GROUP NOTE  Spirituality group facilitated by Simone Curia, MDiv, [place].  Group Description:  Group focused on topic of hope.  Patients participated in facilitated discussion around topic, connecting with one another around experiences and definitions for hope.  Group members engaged with visual explorer photos, reflecting on what hope looks like for them today.  Group engaged in discussion around how their definitions of hope are present today in hospital.   Modalities: Psycho-social ed, Adlerian, Narrative, MI Patient Progress: Did not attend
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
## 2022-12-14 NOTE — ED Notes (Signed)
   12/13/22 2327  What Happened  Was fall witnessed? No  Who witnessed fall? MHT YALEIA  Patients activity before fall bathroom-unassisted  Point of contact hip/leg  Was patient injured? Unsure  Patient found on floor  Found by Staff-comment (MHT Esperanza Richters)  Stated prior activity bathroom-unassisted  Provider Notification  Provider Name/Title Glory Buff  Date Provider Notified 12/13/22  Time Provider Notified 2320  Method of Notification Face-to-face  Notification Reason Fall  Provider response At bedside  Date of Provider Response 12/13/22  Time of Provider Response 2320  Follow Up  Family notified Yes - comment  Time family notified 2340  Adult Fall Risk Assessment  Risk Factor Category (scoring not indicated) High fall risk per protocol (document High fall risk)  Patient Fall Risk Level High fall risk  Adult Fall Risk Interventions  Required Bundle Interventions *See Row Information* High fall risk - low, moderate, and high requirements implemented  Fall intervention(s) refused/Patient educated regarding refusal Nonskid socks;Yellow bracelet  Vitals  Temp 97.6 F (36.4 C)  Temp Source Oral  BP (!) 127/90  BP Location Right Arm  BP Method Automatic  Patient Position (if appropriate) Lying  Pulse Rate 99  Resp 20  Oxygen Therapy  SpO2 100 %  O2 Device Room Air  Pain Assessment  Pain Scale 0-10  Pain Score 10  Pain Type Acute pain  Pain Location Leg  Pain Orientation Right;Left  Pain Descriptors / Indicators Aching;Burning  Pain Frequency Constant  Pain Onset On-going  Neurological  Neuro (WDL) WDL  Level of Consciousness Alert  Musculoskeletal  Musculoskeletal (WDL) X  Musculoskeletal Details  Right Lower Leg Weakness

## 2022-12-14 NOTE — Group Note (Signed)
Group Topic: Relaxation  Group Date: 12/14/2022 Start Time: 1640 End Time: 1700 Facilitators: Dickie La, RN  Department: [facility]  Number of Participants: 4  Group Focus: relaxation Treatment Modality:  Leisure Development Interventions utilized were clarification and support Purpose: increase insight  Name: Kristopher [name] Date of Birth: [birth year]  MR: [id number]    Level of Participation: active Quality of Participation: attentive, cooperative, and engaged Interactions with others: gave feedback Mood/Affect: appropriate Triggers (if applicable): none Cognition: coherent/clear and logical Progress: Gaining insight Response: Pt went to the courtyard and participated in relaxation group. 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 Plan: follow-up needed  Patients Problems:  Patient Active Problem List   Diagnosis Date Noted   Alcohol use disorder, severe, dependence (HCC) 12/12/2022   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

## 2022-12-14 NOTE — ED Notes (Signed)
Patient moving all limbs in MRI. Prior assessment he was not able to move them or have feeling. All extremities normal movement.

## 2022-12-14 NOTE — ED Provider Notes (Signed)
[facility]EM[facility] DEPT [facility] Emergency Department Provider Note MRN:  [id number]  Arrival date & time: 12/14/22     Chief Complaint   Fall, Paralysis, and Leg Pain   History of Present Illness   Kristopher [name] is a 52 y.o. year-old male with a history of COPD, diabetes, hypertension presenting to the ED with chief complaint of fall.  Patient has been having some increased lower back pain for the past few days.  Also has been having some abdominal discomfort.  Larey Seat out of bed this evening about 2 or 3 hours ago.  Since then has been really unable to move or feel his legs.  Arms feel okay.  Review of Systems  A thorough review of systems was obtained and all systems are negative except as noted in the HPI and PMH.   Patient's Health History    Past Medical History:  Diagnosis Date   Alcohol dependence (HCC)    COPD (chronic obstructive pulmonary disease) (HCC)    Diabetes mellitus without complication (HCC)    GERD (gastroesophageal reflux disease)    Hypertension    Pancreatitis    Pancreatitis     Past Surgical History:  Procedure Laterality Date   ABDOMINAL SURGERY     ESOPHAGOGASTRODUODENOSCOPY N/A 02/12/2020   Procedure: ESOPHAGOGASTRODUODENOSCOPY (EGD);  Surgeon: Toledo, Boykin Nearing, MD;  Location: [facility] ENDOSCOPY;  Service: Gastroenterology;  Laterality: N/A;    Family History  Problem Relation Age of Onset   Diabetes Other     Social History   Socioeconomic History   Marital status: Married    Spouse name: Not on file   Number of children: Not on file   Years of education: Not on file   Highest education level: Not on file  Occupational History   Not on file  Tobacco Use   Smoking status: Every Day    Current packs/day: 1.00    Types: Cigarettes   Smokeless tobacco: Never  Vaping Use   Vaping status: Never Used  Substance and Sexual Activity   Alcohol use: Yes    Alcohol/week: 18.0 standard drinks of alcohol    Types: 9 Cans of beer, 9 Shots  of liquor per week    Comment: 5 hard lemonades a day   Drug use: Not Currently    Types: Marijuana, Cocaine   Sexual activity: Not on file  Other Topics Concern   Not on file  Social History Narrative   Not on file   Social Determinants of Health   Financial Resource Strain: Not on file  Food Insecurity: No Food Insecurity (12/12/2022)   Hunger Vital Sign    Worried About Running Out of Food in the Last Year: Never true    Ran Out of Food in the Last Year: Never true  Transportation Needs: No Transportation Needs (12/12/2022)   PRAPARE - Administrator, Civil Service (Medical): No    Lack of Transportation (Non-Medical): No  Physical Activity: Not on file  Stress: Not on file  Social Connections: Not on file  Intimate Partner Violence: Not At Risk (12/12/2022)   Humiliation, Afraid, Rape, and Kick questionnaire    Fear of Current or Ex-Partner: No    Emotionally Abused: No    Physically Abused: No    Sexually Abused: No     Physical Exam   Vitals:   12/14/22 0345 12/14/22 0445  BP: 124/89 127/78  Pulse: 83 80  Resp:  18  Temp:    SpO2: 96% 98%  CONSTITUTIONAL: Chronically ill-appearing, NAD NEURO/PSYCH:  Alert and oriented x 3, lack of motor function to the bilateral lower extremities, no real response to pain, upgoing Babinski on the left EYES:  eyes equal and reactive ENT/NECK:  no LAD, no JVD CARDIO: Regular rate, well-perfused, normal S1 and S2 PULM:  CTAB no wheezing or rhonchi GI/GU:  non-distended, non-tender MSK/SPINE:  No gross deformities, no edema SKIN:  no rash, atraumatic   *Additional and/or pertinent findings included in MDM below  Diagnostic and Interventional Summary    EKG Interpretation Date/Time:    Ventricular Rate:    PR Interval:    QRS Duration:    QT Interval:    QTC Calculation:   R Axis:      Text Interpretation:         Labs Reviewed  COMPREHENSIVE METABOLIC PANEL - Abnormal; Notable for the following  components:      Result Value   Sodium 133 (*)    Glucose, Bld 215 (*)    Calcium 8.5 (*)    Albumin 3.3 (*)    All other components within normal limits  I-STAT CHEM 8, ED - Abnormal; Notable for the following components:   Glucose, Bld 217 (*)    All other components within normal limits  I-STAT CG4 LACTIC ACID, ED - Abnormal; Notable for the following components:   Lactic Acid, Venous 2.0 (*)    All other components within normal limits  CBC  ETHANOL  PROTIME-INR  URINALYSIS, ROUTINE W REFLEX MICROSCOPIC  SAMPLE TO BLOOD BANK    MR Cervical Spine Wo Contrast  Final Result    CT HEAD WO CONTRAST ( )  Final Result    CT CERVICAL SPINE WO CONTRAST  Final Result    CT CHEST ABDOMEN PELVIS WO CONTRAST  Final Result    CT T-SPINE NO CHARGE  Final Result    CT L-SPINE NO CHARGE  Final Result    MR THORACIC SPINE WO CONTRAST    (Results Pending)  MR LUMBAR SPINE WO CONTRAST    (Results Pending)    Medications  HYDROmorphone (DILAUDID) injection 1 mg (1 mg Intravenous Given 12/14/22 0104)  HYDROmorphone (DILAUDID) injection 1 mg (1 mg Intravenous Given 12/14/22 0214)  LORazepam (ATIVAN) injection 2 mg (2 mg Intravenous Given 12/14/22 0459)  dexamethasone (DECADRON) injection 10 mg (10 mg Intravenous Given 12/14/22 0324)  HYDROmorphone (DILAUDID) injection 1 mg (1 mg Intravenous Given 12/14/22 0324)  HYDROmorphone (DILAUDID) injection 1 mg (1 mg Intravenous Given 12/14/22 0522)     Procedures  /  Critical Care Procedures  ED Course and Medical Decision Making  Initial Impression and Ddx Concerning presentation for possible spinal fracture with myelopathy.  Strong pulses in the extremities but unable to move them, not really responding to pain, upgoing Babinski.  Obtaining stat CT imaging, will reach out to neurosurgery.  Past medical/surgical history that increases complexity of ED encounter: Diabetes, COPD  Interpretation of Diagnostics I personally reviewed the  laboratory assessment and my interpretation is as follows: Significant blood count or electrolyte disturbance  CT imaging largely unremarkable.  Patient Reassessment and Ultimate Disposition/Management     Neurosurgery recommending MRI, still pending.  Patient was moving his legs a bit during MRI and so there is growing concern for conversion disorder.  Signed out to oncoming provider at shift change.  Patient management required discussion with the following services or consulting groups:  Neurosurgery  Complexity of Problems Addressed Acute illness or injury that poses threat of life  of bodily function  Additional Data Reviewed and Analyzed Further history obtained from: EMS on arrival  Additional Factors Impacting ED Encounter Risk Consideration of hospitalization  Sameul Schrick. Pilar Plate, MD [facility] Emergency Medicine [facility] [email or web address]  Final Clinical Impressions(s) / ED Diagnoses     ICD-10-CM   1. Weakness of both lower extremities  R29.898       ED Discharge Orders     None        Discharge Instructions Discussed with and Provided to Patient:   Discharge Instructions   None      Sabas Sous, MD 12/14/22 [phone]

## 2022-12-14 NOTE — ED Notes (Signed)
Pt in room sleeping, denies SI/HI/AVH. Denies s/s of withdrawal. No noted distress. Will continue to monitor for safety

## 2022-12-14 NOTE — Progress Notes (Signed)
Pt returned from [facility] where he was admitted for evaluation due to unwitnessed fall. Pt is ambulatory with a steady gait. Pt was educated on fall prevention and was advised to alert staff of any issue. Pt is alert and oriented X3 and appears irritable. Skin assessment completed. Pt denies current SI/HI/AVH, plan or intent. 15 minutes safety checks initiated per order. Staff will monitor for pt's safety.

## 2022-12-14 NOTE — ED Notes (Signed)
Patient transported to MRI

## 2022-12-14 NOTE — Group Note (Signed)
Group Topic: Change and Accountability  Group Date: 12/14/2022 Start Time: 0745 End Time: 0820 Facilitators: Debe Coder, NT  Department: [facility]  Number of Participants: 3  Group Focus: goals/reality orientation Treatment Modality:  Individual Therapy Interventions utilized were group exercise Purpose: enhance coping skills  Name: Kristopher [name] Date of Birth: 07-09-1970  MR: [id number]    Level of Participation: did not attend group Quality of Participation:  Interactions with others:  Mood/Affect:  Triggers (if applicable):  Cognition:  Progress:  Response:  Plan:   Patients Problems:  Patient Active Problem List   Diagnosis Date Noted   Alcohol use disorder, severe, dependence (HCC) 12/12/2022   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

## 2022-12-14 NOTE — Discharge Instructions (Addendum)
Patient will be discharging to [facility] on today 12/18/2022 by 6:00pm with transportation provided via [employer]. Address is [address] [place], [place] [address]  [facility] [address], [place], [address] [phone] phone  New Patient Assessment/Therapy Walk-Ins:  Monday and Wednesday: 8 am until slots are full. Every 1st and 2nd Fridays of the month: 1 pm - 5 pm.  NO ASSESSMENT/THERAPY WALK-INS ON TUESDAYS OR THURSDAYS  New Patient Assessment/Medication Management Walk-Ins:  Monday - Friday:  8 am - 11 am.  For all walk-ins, we ask that you arrive by 7:30 am because patients will be seen in the order of arrival.  Availability is limited; therefore, you may not be seen on the same day that you walk-in.  Our goal is to serve and meet the needs of our community to the best of our ability.  SUBSTANCE USE TREATMENT for Medicaid and State Funded/IPRS  [facility] [address], [place], [address] [phone] phone NOTE: [facility] is no longer offering IOP services.  Serves those who are low-income or have no insurance.  [facility] [address], [place], [place], [address] [phone] phone [phone] fax NOTE: Does have Substance Abuse-Intensive Outpatient Program [facility]) as well as transitional housing if eligible.  [facility] [address] [place], [place], [address] [phone] phone [phone] fax  [facility] [phone] [place], [place], [address] [phone] phone [phone] fax  HALFWAY HOUSES:  [facility] [phone]  [employer].oxfordvacancies.com  12 STEP PROGRAMS:  Alcoholics Anonymous of [place] SoftwareChalet.be  Narcotics Anonymous of [place] HitProtect.dk  Al-Anon of [place], [place] www.[place]alanon.org/find-meetings.html  Nar-Anon  https://nar-anon.org/find-a-meetin  List of Residential placements:   [facility] in [place]: [phone]  [facility]: [phone]  [place], [place] [phone]: Male and male facility; 30-day program: (uninsured and Medicaid such as Laurena Bering, [place], [place], partners)  [facility]: [phone]; men and women's facility; 28 days; Can have Medicaid tailored plan Tour manager or Partners)  [facility]: [phone] Karoline Caldwell or Larita Fife; 28 day program; must be fully detox; tailored Medicaid or no insurance  [address] in [place], [place]; [phone]; 28 day all males program; no insurance accepted  [facility] in [place]: Gabriel Rung [phone] (no insurance or Medicaid only); 90 days; outpatient services but provide housing in apartments [place]  [facility] Admission: [phone]: Patient must complete phone screening for placement: [place], [place]; 6 month program; uninsured, Medicaid, and [employer].   [facility]: no insurance required; [phone]  [facility]: [phone]; Intake: Molly Maduro; Must fill out application online; Alecia Lemming Delay [phone] x [address] [facility] in [place], [place]: [phone]; Admissions Coordinators Mr. Maurine Minister or Barron Alvine; 90 day program.  [facility]: [place], [place] [phone]; Co-Ed 9 month to a year program; Online application; Men entry fee is $500 (6-17months);  [employer]: [address] [place], [place] [address]; no fee or insurance required; minimum of 2 years; Highly structured; work based; Intake Coordinator is Thayer Ohm [phone]  [facility] in [place], [place]: [phone]; Fax number is [phone]; website: www.[facility].org; Requires 3-6 page autobiography; 2 year program (18 months and then 26month transitional housing); Admission fee is $300; no insurance needed; work  Automotive engineer in [place], [place]: [employer] Desk Staff: Danise Edge [phone]: They have a Men's Regenerations Program 6-95months. Free program; There is an initial $300 fee however, they are willing to work  with patients regarding that. Application is online.  [facility]: Admissions [phone] Doran Heater ext [phone]; Any 7-90 day program is out of pocket; 12 month program is free of charge; there is a $275   Follow-up recommendations:  Activity:  Normal, as tolerated Diet:  Per PCP recommendation  Patient is instructed prior to discharge to: Take all medications as prescribed by her mental healthcare provider. Report any adverse effects and/or reactions from the medicines to her outpatient provider promptly. Patient has been instructed & cautioned: To not engage in alcohol and or illegal drug use while on prescription medicines.  In the event of worsening symptoms, patient is instructed to call the crisis hotline at 988, 911 and or go to the nearest ED for appropriate evaluation and treatment of symptoms. To follow-up with her primary care provider for your other medical issues, concerns and or health care needs.

## 2022-12-14 NOTE — ED Notes (Signed)
In MRI with patient for monitoring due to medications administered.

## 2022-12-14 NOTE — ED Notes (Signed)
Pt observed/assessed in room sleeping. RR even and unlabored, appearing in no noted distress. Environmental check complete, will continue to monitor for safety

## 2022-12-14 NOTE — ED Notes (Signed)
Attempted report to [facility] x 1

## 2022-12-14 NOTE — ED Notes (Signed)
Pt ambulatory with strong and steady gait at this time.

## 2022-12-14 NOTE — Discharge Planning (Signed)
LCSW was informed by MD that patient would like to move forward with CDIOP upon discharge. Intensive outpatient services have been arranged for the patient with [facility]. Intake appt has been scheduled for Tuesday 12/18/22 at 9:00am. Patient to be informed of plan once group is complete with Chaplain. No other needs to report at this time.   Fernande Boyden, LCSW Clinical Social Worker [facility] Ph: [phone]

## 2022-12-14 NOTE — ED Notes (Signed)
Pt was found on the floor in his room by [facility] during rounding. Patient states his legs gave out and he fell back and hit his head. Pt states he is unable to feel sensation in his legs from the navel down. He is able to move upper extremities and feels pain in his neck. Pt is conscious. Pt reports leg pain of 10/10.

## 2022-12-15 DIAGNOSIS — F1090 Alcohol use, unspecified, uncomplicated: Secondary | ICD-10-CM

## 2022-12-15 NOTE — Group Note (Signed)
Group Topic: Positive Affirmations  Group Date: 12/15/2022 Start Time: 1100 End Time: 1145 Facilitators: Cassandria Anger  Department: Operating Room Services  Number of Participants: 6  Group Focus: affirmation Treatment Modality:  Psychoeducation Interventions utilized were patient education Purpose: express feelings and increase insight  Name: Kristopher [name] Date of Birth: 04-Dec-1970  MR: [id number]    Level of Participation: moderate Quality of Participation: attentive, cooperative, engaged, offered feedback, and supportive Interactions with others: gave feedback Mood/Affect: appropriate, bright, and positive Triggers (if applicable): N/A Cognition: coherent/clear, goal directed, and insightful Progress: Moderate Response: Patient was very positive and insightful. Is goal-directed and expresses his awareness to what need to be achieved Plan: patient will be encouraged to continue to attend group  Patients Problems:  Patient Active Problem List   Diagnosis Date Noted   Alcohol use disorder, severe, dependence (HCC) 12/12/2022   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

## 2022-12-15 NOTE — ED Notes (Signed)
Patient was provided lunch.

## 2022-12-15 NOTE — ED Notes (Signed)
Patient A&Ox4. Denies intent to harm self/others when asked. Denies A/VH. Patient denies any physical complaints when asked. No acute distress noted. Pt states, "when I leave, do I get a script for the meds they gave me at the hospital or do I have to go to a pain clinic for the meds? I can't get the meds here, can I"? Writer informed pt that the provider would use their judgement to determined course of tx if pt was having pain issues. Pt verbalized understanding. Pt sitting in dayroom talking with peers. Routine safety checks conducted according to facility protocol. Encouraged patient to notify staff if thoughts of harm toward self or others arise. Patient verbalize understanding and agreement. Will continue to monitor for safety.

## 2022-12-15 NOTE — ED Notes (Signed)
Patient was provided a snack

## 2022-12-15 NOTE — ED Notes (Signed)
Pt sleeping at present, no distress noted.  Monitoring for safety. 

## 2022-12-15 NOTE — ED Notes (Signed)
Pt sitting in dayroom watching tv and interacting with peers. No acute distress noted. No concerns voiced. Informed pt to notify staff with any needs or assistance. Pt verbalized understanding or agreement. Will continue to monitor for safety.

## 2022-12-15 NOTE — ED Notes (Signed)
Voices c/o mild headache and anxiety. Requested  tylenol and anxiety med. PRN meds given per order. Will continue to monitor for safety

## 2022-12-15 NOTE — ED Provider Notes (Signed)
Behavioral Health Progress Note  Date and Time: 12/15/2022 4:15 PM Name: Kristopher [name] MRN:  [id number]  Subjective:  Kristopher [name] is a 52 y.o. male patient presented to [facility] with complaints of alcohol abuse and requesting assistance with detox.   On assessment today, patient reports good mood and improving sleep and appetite. He does report some anxiety surrounding the unknown regarding his job and living arrangements with family. He does not believe that they will allow him back on Monday unless they have a call explaining the IOP program to them. He otherwise denies acute concerns or complaints today.  He denies SI, HI, and AVH. There was a concern that patient is med seeking brought to my attention today, but patient did not request additional medications during our time speaking. He worries that the CDIOP will hinder him from starting a new job, and he is encouraged to have his health managed now and seek employment at a later time once he is more stable. He is receptive.   He denies somatic concerns or complaints as well as adverse effects of his medications. He denies alcohol cravings today.   Diagnosis:  Final diagnoses:  Alcohol use disorder    Total Time spent with patient: 30 minutes  Past Psychiatric History: AUD   Past Medical History: HTN, COPD Family History: DM Social History: lives with cousin and cousin's husband in [place], previously works as a Naval architect but recently unemployed, support system is his cousin  Additional Social History:                         Sleep: Fair  Appetite:  Fair  Current Medications:  Current Facility-Administered Medications  Medication Dose Route Frequency Provider Last Rate Last Admin   acetaminophen (TYLENOL) tablet 650 mg  650 mg Oral Q6H PRN Kizzie Ide B, MD   650 mg at 12/15/22 1524   albuterol (VENTOLIN HFA) 108 (90 Base) MCG/ACT inhaler 1-2 puff  1-2 puff Inhalation Q6H PRN Kizzie Ide B, MD        amLODipine (NORVASC) tablet 10 mg  10 mg Oral Daily Kizzie Ide B, MD   10 mg at 12/15/22 0911   gabapentin (NEURONTIN) capsule 300 mg  300 mg Oral TID Kizzie Ide B, MD   300 mg at 12/15/22 1524   hydrOXYzine (ATARAX) tablet 25 mg  25 mg Oral Q6H PRN Kizzie Ide B, MD   25 mg at 12/15/22 1524   lisinopril (ZESTRIL) tablet 5 mg  5 mg Oral Daily Kizzie Ide B, MD   5 mg at 12/15/22 6440   loperamide (IMODIUM) capsule 2-4 mg  2-4 mg Oral PRN Lance Muss, MD       multivitamin with minerals tablet 1 tablet  1 tablet Oral Daily Kizzie Ide B, MD   1 tablet at 12/15/22 0911   nicotine (NICODERM CQ - dosed in mg/24 hours) patch 14 mg  14 mg Transdermal Daily Kizzie Ide B, MD   14 mg at 12/15/22 0910   ondansetron (ZOFRAN-ODT) disintegrating tablet 4 mg  4 mg Oral Q6H PRN Kizzie Ide B, MD       thiamine (VITAMIN B1) tablet 100 mg  100 mg Oral Daily Kizzie Ide B, MD   100 mg at 12/15/22 0912   traZODone (DESYREL) tablet 50 mg  50 mg Oral QHS PRN Lance Muss, MD   50 mg at 12/14/22 2142   Current Outpatient Medications  Medication Sig  Dispense Refill   albuterol (VENTOLIN HFA) 108 (90 Base) MCG/ACT inhaler Inhale 1-2 puffs into the lungs every 6 (six) hours as needed for wheezing or shortness of breath. 8 g 2   amLODipine (NORVASC) 10 MG tablet Take 1 tablet (10 mg total) by mouth daily. 30 tablet 0   gabapentin (NEURONTIN) 300 MG capsule Take 1 capsule (300 mg total) by mouth 3 (three) times daily. 90 capsule 0   lisinopril (ZESTRIL) 5 MG tablet Take 1 tablet (5 mg total) by mouth daily. 30 tablet 0   methocarbamol (ROBAXIN) 750 MG tablet Take 1 tablet (750 mg total) by mouth 3 (three) times daily. (Patient taking differently: Take 750 mg by mouth 3 (three) times daily as needed for muscle spasms.) 20 tablet 0   oxyCODONE (OXY IR/ROXICODONE) 5 MG immediate release tablet Take 5 mg by mouth every 6 (six) hours as needed for moderate pain.      Labs  Lab Results:   Admission on 12/14/2022, Discharged on 12/14/2022  Component Date Value Ref Range Status   Sodium 12/14/2022 133 (L)  135 - 145 mmol/L Final   Potassium 12/14/2022 4.0  3.5 - 5.1 mmol/L Final   Chloride 12/14/2022 99  98 - 111 mmol/L Final   CO2 12/14/2022 26  22 - 32 mmol/L Final   Glucose, Bld 12/14/2022 215 (H)  70 - 99 mg/dL Final   Glucose reference range applies only to samples taken after fasting for at least 8 hours.   BUN 12/14/2022 10  6 - 20 mg/dL Final   Creatinine, Ser 12/14/2022 0.85  0.61 - 1.24 mg/dL Final   Calcium 16/12/9602 8.5 (L)  8.9 - 10.3 mg/dL Final   Total Protein 54/11/8117 6.5  6.5 - 8.1 g/dL Final   Albumin 14/78/2956 3.3 (L)  3.5 - 5.0 g/dL Final   AST 21/30/8657 30  15 - 41 U/L Final   ALT 12/14/2022 44  0 - 44 U/L Final   Alkaline Phosphatase 12/14/2022 80  38 - 126 U/L Final   Total Bilirubin 12/14/2022 0.6  0.3 - 1.2 mg/dL Final   GFR, Estimated 12/14/2022 >60  >60 mL/min Final   Comment: (NOTE) Calculated using the CKD-EPI Creatinine Equation (2021)    Anion gap 12/14/2022 8  5 - 15 Final   Performed at [facility], [address], [place], [place] [address]   Sodium 12/14/2022 137  135 - 145 mmol/L Final   Potassium 12/14/2022 3.9  3.5 - 5.1 mmol/L Final   Chloride 12/14/2022 100  98 - 111 mmol/L Final   BUN 12/14/2022 11  6 - 20 mg/dL Final   Creatinine, Ser 12/14/2022 0.70  0.61 - 1.24 mg/dL Final   Glucose, Bld 29/52/8413 217 (H)  70 - 99 mg/dL Final   Glucose reference range applies only to samples taken after fasting for at least 8 hours.   Calcium, Ion 12/14/2022 1.21  1.15 - 1.40 mmol/L Final   TCO2 12/14/2022 24  22 - 32 mmol/L Final   Hemoglobin 12/14/2022 14.3  13.0 - 17.0 g/dL Final   HCT 24/40/1027 42.0  39.0 - 52.0 % Final   WBC 12/14/2022 7.9  4.0 - 10.5 K/uL Final   RBC 12/14/2022 4.67  4.22 - 5.81 MIL/uL Final   Hemoglobin 12/14/2022 13.3  13.0 - 17.0 g/dL Final   HCT 25/36/6440 41.2  39.0 - 52.0 % Final   MCV  12/14/2022 88.2  80.0 - 100.0 fL Final   MCH 12/14/2022 28.5  26.0 - 34.0 pg Final   MCHC 12/14/2022 32.3  30.0 - 36.0 g/dL Final   RDW 82/95/6213 15.1  11.5 - 15.5 % Final   Platelets 12/14/2022 182  150 - 400 K/uL Final   nRBC 12/14/2022 0.0  0.0 - 0.2 % Final   Performed at [facility], [address] [place], [place] [address]   Alcohol, Ethyl (B) 12/14/2022 <10  <10 mg/dL Final   Comment: (NOTE) Lowest detectable limit for serum alcohol is 10 mg/dL.  For medical purposes only. Performed at [facility], [address], [place], [place] [address]    Lactic Acid, Venous 12/14/2022 2.0 (HH)  0.5 - 1.9 mmol/L Final   Prothrombin Time 12/14/2022 12.7  11.4 - 15.2 seconds Final   INR 12/14/2022 0.9  0.8 - 1.2 Final   Comment: (NOTE) INR goal varies based on device and disease states. Performed at [facility], [address], [place], [place] [address]    Blood Bank Specimen 12/14/2022 SAMPLE AVAILABLE FOR TESTING   Final   Sample Expiration 12/14/2022    Final                   Value:12/17/2022,2359 Performed at [facility], [address], [place], [place] [address]   Admission on 12/12/2022, Discharged on 12/12/2022  Component Date Value Ref Range Status   WBC 12/12/2022 8.1  4.0 - 10.5 K/uL Final   RBC 12/12/2022 5.00  4.22 - 5.81 MIL/uL Final   Hemoglobin 12/12/2022 14.3  13.0 - 17.0 g/dL Final   HCT 40/12/2723 43.0  39.0 - 52.0 % Final   MCV 12/12/2022 86.0  80.0 - 100.0 fL Final   MCH 12/12/2022 28.6  26.0 - 34.0 pg Final   MCHC 12/12/2022 33.3  30.0 - 36.0 g/dL Final   RDW 36/64/4034 14.8  11.5 - 15.5 % Final   Platelets 12/12/2022 200  150 - 400 K/uL Final   nRBC 12/12/2022 0.0  0.0 - 0.2 % Final   Neutrophils Relative % 12/12/2022 64  % Final   Neutro Abs 12/12/2022 5.3  1.7 - 7.7 K/uL Final   Lymphocytes Relative 12/12/2022 27  % Final   Lymphs Abs 12/12/2022 2.2  0.7 - 4.0 K/uL Final   Monocytes Relative 12/12/2022 5  % Final    Monocytes Absolute 12/12/2022 0.4  0.1 - 1.0 K/uL Final   Eosinophils Relative 12/12/2022 2  % Final   Eosinophils Absolute 12/12/2022 0.1  0.0 - 0.5 K/uL Final   Basophils Relative 12/12/2022 1  % Final   Basophils Absolute 12/12/2022 0.0  0.0 - 0.1 K/uL Final   Immature Granulocytes 12/12/2022 1  % Final   Abs Immature Granulocytes 12/12/2022 0.04  0.00 - 0.07 K/uL Final   Performed at [facility], [address] [place], [place] [address]   Sodium 12/12/2022 136  135 - 145 mmol/L Final   Potassium 12/12/2022 4.0  3.5 - 5.1 mmol/L Final   Chloride 12/12/2022 94 (L)  98 - 111 mmol/L Final   CO2 12/12/2022 27  22 - 32 mmol/L Final   Glucose, Bld 12/12/2022 141 (H)  70 - 99 mg/dL Final   Glucose reference range applies only to samples taken after fasting for at least 8 hours.   BUN 12/12/2022 6  6 - 20 mg/dL Final   Creatinine, Ser 12/12/2022 0.73  0.61 - 1.24 mg/dL Final   Calcium 56/38/7564 9.2  8.9 - 10.3  mg/dL Final   Total Protein 09/81/1914 6.8  6.5 - 8.1 g/dL Final   Albumin 78/29/5621 3.6  3.5 - 5.0 g/dL Final   AST 30/86/5784 40  15 - 41 U/L Final   ALT 12/12/2022 59 (H)  0 - 44 U/L Final   Alkaline Phosphatase 12/12/2022 86  38 - 126 U/L Final   Total Bilirubin 12/12/2022 0.6  0.3 - 1.2 mg/dL Final   GFR, Estimated 12/12/2022 >60  >60 mL/min Final   Comment: (NOTE) Calculated using the CKD-EPI Creatinine Equation (2021)    Anion gap 12/12/2022 15  5 - 15 Final   Performed at [facility], [address], [place], [place] [address]   Hgb A1c MFr Bld 12/12/2022 7.7 (H)  4.8 - 5.6 % Final   Comment: (NOTE) Pre diabetes:          5.7%-6.4%  Diabetes:              >6.4%  Glycemic control for   <7.0% adults with diabetes    Mean Plasma Glucose 12/12/2022 174.29  mg/dL Final   Performed at [facility], [address] [place], [place] [address]   Magnesium 12/12/2022 1.7  1.7 - 2.4 mg/dL Final   Performed at [facility], [address],  [place], [place] [address]   Alcohol, Ethyl (B) 12/12/2022 <10  <10 mg/dL Final   Comment: (NOTE) Lowest detectable limit for serum alcohol is 10 mg/dL.  For medical purposes only. Performed at [facility], [address] [place], [place] [address]    Cholesterol 12/12/2022 184  0 - 200 mg/dL Final   Triglycerides 53/66/4403 250 (H)  <150 mg/dL Final   HDL 47/42/5956 37 (L)  >40 mg/dL Final   Total CHOL/HDL Ratio 12/12/2022 5.0  RATIO Final   VLDL 12/12/2022 50 (H)  0 - 40 mg/dL Final   LDL Cholesterol 12/12/2022 97  0 - 99 mg/dL Final   Comment:        Total Cholesterol/HDL:CHD Risk Coronary Heart Disease Risk Table                     Men   Women  1/2 Average Risk   3.4   3.3  Average Risk       5.0   4.4  2 X Average Risk   9.6   7.1  3 X Average Risk  23.4   11.0        Use the calculated Patient Ratio above and the CHD Risk Table to determine the patient's CHD Risk.        ATP III CLASSIFICATION (LDL):  <100     mg/dL   Optimal  387-564  mg/dL   Near or Above                    Optimal  130-159  mg/dL   Borderline  332-951  mg/dL   High  >884     mg/dL   Very High Performed at [facility], [address], [place], [place] [address]    TSH 12/12/2022 3.275  0.350 - 4.500 uIU/mL Final   Comment: Performed by a 3rd Generation assay with a functional sensitivity of <=0.01 uIU/mL. Performed at [facility], [address] [place], [place] [address]    Prolactin 12/12/2022 7.0  3.6 - 25.2 ng/mL Final   Comment: (NOTE) Performed At: [facility] [address]  [address] [place], [place] [id number] Jolene Schimke MD VF:[id number]    Color, Urine 12/12/2022 YELLOW  YELLOW Final   APPearance 12/12/2022 CLEAR  CLEAR Final   Specific Gravity, Urine 12/12/2022 1.015  1.005 - 1.030 Final   pH 12/12/2022 7.0  5.0 - 8.0 Final   Glucose, UA 12/12/2022 NEGATIVE  NEGATIVE mg/dL Final   Hgb urine dipstick 12/12/2022 NEGATIVE  NEGATIVE Final   Bilirubin Urine  12/12/2022 NEGATIVE  NEGATIVE Final   Ketones, ur 12/12/2022 NEGATIVE  NEGATIVE mg/dL Final   Protein, ur 41/66/0630 NEGATIVE  NEGATIVE mg/dL Final   Nitrite 16/03/930 NEGATIVE  NEGATIVE Final   Leukocytes,Ua 12/12/2022 NEGATIVE  NEGATIVE Final   Performed at [facility], [address], [place], [place] [address]   POC Amphetamine UR 12/12/2022 None Detected  NONE DETECTED (Cut Off Level 1000 ng/mL) Final   POC Secobarbital (BAR) 12/12/2022 None Detected  NONE DETECTED (Cut Off Level 300 ng/mL) Final   POC Buprenorphine (BUP) 12/12/2022 None Detected  NONE DETECTED (Cut Off Level 10 ng/mL) Final   POC Oxazepam (BZO) 12/12/2022 None Detected  NONE DETECTED (Cut Off Level 300 ng/mL) Final   POC Cocaine UR 12/12/2022 None Detected  NONE DETECTED (Cut Off Level 300 ng/mL) Final   POC Methamphetamine UR 12/12/2022 None Detected  NONE DETECTED (Cut Off Level 1000 ng/mL) Final   POC Morphine 12/12/2022 None Detected  NONE DETECTED (Cut Off Level 300 ng/mL) Final   POC Methadone UR 12/12/2022 None Detected  NONE DETECTED (Cut Off Level 300 ng/mL) Final   POC Oxycodone UR 12/12/2022 None Detected  NONE DETECTED (Cut Off Level 100 ng/mL) Final   POC Marijuana UR 12/12/2022 None Detected  NONE DETECTED (Cut Off Level 50 ng/mL) Final  Admission on 08/27/2022, Discharged on 08/31/2022  Component Date Value Ref Range Status   WBC 08/27/2022 7.6  4.0 - 10.5 K/uL Final   RBC 08/27/2022 5.45  4.22 - 5.81 MIL/uL Final   Hemoglobin 08/27/2022 15.6  13.0 - 17.0 g/dL Final   HCT 22/04/5425 46.1  39.0 - 52.0 % Final   MCV 08/27/2022 84.6  80.0 - 100.0 fL Final   MCH 08/27/2022 28.6  26.0 - 34.0 pg Final   MCHC 08/27/2022 33.8  30.0 - 36.0 g/dL Final   RDW 09/16/7626 13.9  11.5 - 15.5 % Final   Platelets 08/27/2022 221  150 - 400 K/uL Final   nRBC 08/27/2022 0.0  0.0 - 0.2 % Final   Neutrophils Relative % 08/27/2022 60  % Final   Neutro Abs 08/27/2022 4.6  1.7 - 7.7 K/uL Final   Lymphocytes Relative  08/27/2022 32  % Final   Lymphs Abs 08/27/2022 2.5  0.7 - 4.0 K/uL Final   Monocytes Relative 08/27/2022 5  % Final   Monocytes Absolute 08/27/2022 0.4  0.1 - 1.0 K/uL Final   Eosinophils Relative 08/27/2022 2  % Final   Eosinophils Absolute 08/27/2022 0.1  0.0 - 0.5 K/uL Final   Basophils Relative 08/27/2022 1  % Final   Basophils Absolute 08/27/2022 0.1  0.0 - 0.1 K/uL Final   Immature Granulocytes 08/27/2022 0  % Final   Abs Immature Granulocytes 08/27/2022 0.02  0.00 - 0.07 K/uL Final   Performed at [facility], [address], [place], [place] [address]   Sodium 08/27/2022 133 (L)  135 - 145 mmol/L Final   Potassium 08/27/2022 3.9  3.5 - 5.1 mmol/L Final   Chloride 08/27/2022 100  98 - 111 mmol/L Final  CO2 08/27/2022 24  22 - 32 mmol/L Final   Glucose, Bld 08/27/2022 142 (H)  70 - 99 mg/dL Final   Glucose reference range applies only to samples taken after fasting for at least 8 hours.   BUN 08/27/2022 8  6 - 20 mg/dL Final   Creatinine, Ser 08/27/2022 0.80  0.61 - 1.24 mg/dL Final   Calcium 82/95/6213 9.0  8.9 - 10.3 mg/dL Final   Total Protein 08/65/7846 7.4  6.5 - 8.1 g/dL Final   Albumin 96/29/5284 3.9  3.5 - 5.0 g/dL Final   AST 13/24/4010 26  15 - 41 U/L Final   ALT 08/27/2022 28  0 - 44 U/L Final   Alkaline Phosphatase 08/27/2022 48  38 - 126 U/L Final   Total Bilirubin 08/27/2022 0.7  0.3 - 1.2 mg/dL Final   GFR, Estimated 08/27/2022 >60  >60 mL/min Final   Comment: (NOTE) Calculated using the CKD-EPI Creatinine Equation (2021)    Anion gap 08/27/2022 9  5 - 15 Final   Performed at [facility], [address] [place], [place] [address]   Specimen Source 08/27/2022 URINE, CLEAN CATCH   Final   Color, Urine 08/27/2022 YELLOW  YELLOW Final   APPearance 08/27/2022 CLEAR  CLEAR Final   Specific Gravity, Urine 08/27/2022 1.011  1.005 - 1.030 Final   pH 08/27/2022 5.0  5.0 - 8.0 Final   Glucose, UA 08/27/2022 NEGATIVE  NEGATIVE mg/dL Final   Hgb urine  dipstick 08/27/2022 NEGATIVE  NEGATIVE Final   Bilirubin Urine 08/27/2022 NEGATIVE  NEGATIVE Final   Ketones, ur 08/27/2022 NEGATIVE  NEGATIVE mg/dL Final   Protein, ur 66/44/0347 NEGATIVE  NEGATIVE mg/dL Final   Nitrite 42/59/5638 NEGATIVE  NEGATIVE Final   Leukocytes,Ua 08/27/2022 NEGATIVE  NEGATIVE Final   RBC / HPF 08/27/2022 0-5  0 - 5 RBC/hpf Final   WBC, UA 08/27/2022 0-5  0 - 5 WBC/hpf Final   Comment:        Reflex urine culture not performed if WBC <=10, OR if Squamous epithelial cells >5. If Squamous epithelial cells >5 suggest recollection.    Bacteria, UA 08/27/2022 NONE SEEN  NONE SEEN Final   Squamous Epithelial / HPF 08/27/2022 0-5  0 - 5 /HPF Final   Mucus 08/27/2022 PRESENT   Final   Performed at [facility], [address] [place], [place] [address]   Opiates 08/27/2022 POSITIVE (A)  NONE DETECTED Final   Cocaine 08/27/2022 NONE DETECTED  NONE DETECTED Final   Benzodiazepines 08/27/2022 NONE DETECTED  NONE DETECTED Final   Amphetamines 08/27/2022 NONE DETECTED  NONE DETECTED Final   Tetrahydrocannabinol 08/27/2022 NONE DETECTED  NONE DETECTED Final   Barbiturates 08/27/2022 NONE DETECTED  NONE DETECTED Final   Comment: (NOTE) DRUG SCREEN FOR MEDICAL PURPOSES ONLY.  IF CONFIRMATION IS NEEDED FOR ANY PURPOSE, NOTIFY LAB WITHIN 5 DAYS.  LOWEST DETECTABLE LIMITS FOR URINE DRUG SCREEN Drug Class                     Cutoff (ng/mL) Amphetamine and metabolites    1000 Barbiturate and metabolites    200 Benzodiazepine                 200 Opiates and metabolites        300 Cocaine and metabolites        300 THC  50 Performed at [facility], [address], [place], [place] [address]    Magnesium 08/27/2022 1.9  1.7 - 2.4 mg/dL Final   Performed at [facility], [address], [place], [place] [address]   WBC 08/28/2022 9.0  4.0 - 10.5 K/uL Final   RBC 08/28/2022 5.27  4.22 - 5.81 MIL/uL Final   Hemoglobin  08/28/2022 14.6  13.0 - 17.0 g/dL Final   HCT 95/63/8756 44.5  39.0 - 52.0 % Final   MCV 08/28/2022 84.4  80.0 - 100.0 fL Final   MCH 08/28/2022 27.7  26.0 - 34.0 pg Final   MCHC 08/28/2022 32.8  30.0 - 36.0 g/dL Final   RDW 43/32/9518 14.0  11.5 - 15.5 % Final   Platelets 08/28/2022 242  150 - 400 K/uL Final   nRBC 08/28/2022 0.0  0.0 - 0.2 % Final   Neutrophils Relative % 08/28/2022 66  % Final   Neutro Abs 08/28/2022 6.0  1.7 - 7.7 K/uL Final   Lymphocytes Relative 08/28/2022 26  % Final   Lymphs Abs 08/28/2022 2.3  0.7 - 4.0 K/uL Final   Monocytes Relative 08/28/2022 6  % Final   Monocytes Absolute 08/28/2022 0.5  0.1 - 1.0 K/uL Final   Eosinophils Relative 08/28/2022 1  % Final   Eosinophils Absolute 08/28/2022 0.1  0.0 - 0.5 K/uL Final   Basophils Relative 08/28/2022 1  % Final   Basophils Absolute 08/28/2022 0.1  0.0 - 0.1 K/uL Final   Immature Granulocytes 08/28/2022 0  % Final   Abs Immature Granulocytes 08/28/2022 0.03  0.00 - 0.07 K/uL Final   Performed at [facility], [address] [place], [place] [address]   Sodium 08/28/2022 133 (L)  135 - 145 mmol/L Final   Potassium 08/28/2022 3.8  3.5 - 5.1 mmol/L Final   Chloride 08/28/2022 99  98 - 111 mmol/L Final   CO2 08/28/2022 26  22 - 32 mmol/L Final   Glucose, Bld 08/28/2022 107 (H)  70 - 99 mg/dL Final   Glucose reference range applies only to samples taken after fasting for at least 8 hours.   BUN 08/28/2022 14  6 - 20 mg/dL Final   Creatinine, Ser 08/28/2022 0.85  0.61 - 1.24 mg/dL Final   Calcium 09/23/1599 9.0  8.9 - 10.3 mg/dL Final   Total Protein 09/32/3557 6.7  6.5 - 8.1 g/dL Final   Albumin 32/20/2542 3.8  3.5 - 5.0 g/dL Final   AST 70/62/3762 31  15 - 41 U/L Final   ALT 08/28/2022 34  0 - 44 U/L Final   Alkaline Phosphatase 08/28/2022 52  38 - 126 U/L Final   Total Bilirubin 08/28/2022 0.6  0.3 - 1.2 mg/dL Final   GFR, Estimated 08/28/2022 >60  >60 mL/min Final   Comment: (NOTE) Calculated using the  CKD-EPI Creatinine Equation (2021)    Anion gap 08/28/2022 8  5 - 15 Final   Performed at [facility], [address] [place], [place] [address]   Magnesium 08/28/2022 2.0  1.7 - 2.4 mg/dL Final   Performed at [facility], [address] [place], [place] [address]   Phosphorus 08/28/2022 4.3  2.5 - 4.6 mg/dL Final   Performed at [facility], [address], [place], [place] [address]   WBC 08/29/2022 7.5  4.0 - 10.5 K/uL Final   RBC 08/29/2022 4.71  4.22 - 5.81 MIL/uL Final   Hemoglobin 08/29/2022  13.8  13.0 - 17.0 g/dL Final   HCT 47/82/9562 40.5  39.0 - 52.0 % Final   MCV 08/29/2022 86.0  80.0 - 100.0 fL Final   MCH 08/29/2022 29.3  26.0 - 34.0 pg Final   MCHC 08/29/2022 34.1  30.0 - 36.0 g/dL Final   RDW 13/10/6576 13.8  11.5 - 15.5 % Final   Platelets 08/29/2022 212  150 - 400 K/uL Final   nRBC 08/29/2022 0.0  0.0 - 0.2 % Final   Neutrophils Relative % 08/29/2022 48  % Final   Neutro Abs 08/29/2022 3.7  1.7 - 7.7 K/uL Final   Lymphocytes Relative 08/29/2022 40  % Final   Lymphs Abs 08/29/2022 3.0  0.7 - 4.0 K/uL Final   Monocytes Relative 08/29/2022 7  % Final   Monocytes Absolute 08/29/2022 0.5  0.1 - 1.0 K/uL Final   Eosinophils Relative 08/29/2022 4  % Final   Eosinophils Absolute 08/29/2022 0.3  0.0 - 0.5 K/uL Final   Basophils Relative 08/29/2022 1  % Final   Basophils Absolute 08/29/2022 0.1  0.0 - 0.1 K/uL Final   Immature Granulocytes 08/29/2022 0  % Final   Abs Immature Granulocytes 08/29/2022 0.02  0.00 - 0.07 K/uL Final   Performed at [facility], [address] [place], [place] [address]   Sodium 08/29/2022 135  135 - 145 mmol/L Final   Potassium 08/29/2022 4.0  3.5 - 5.1 mmol/L Final   Chloride 08/29/2022 99  98 - 111 mmol/L Final   CO2 08/29/2022 27  22 - 32 mmol/L Final   Glucose, Bld 08/29/2022 179 (H)  70 - 99 mg/dL Final   Glucose reference range applies only to samples taken after fasting for at least 8 hours.   BUN 08/29/2022  13  6 - 20 mg/dL Final   Creatinine, Ser 08/29/2022 0.99  0.61 - 1.24 mg/dL Final   Calcium 95/28/4132 9.1  8.9 - 10.3 mg/dL Final   Total Protein 44/03/270 6.6  6.5 - 8.1 g/dL Final   Albumin 53/66/4403 3.6  3.5 - 5.0 g/dL Final   AST 47/42/5956 29  15 - 41 U/L Final   ALT 08/29/2022 34  0 - 44 U/L Final   Alkaline Phosphatase 08/29/2022 51  38 - 126 U/L Final   Total Bilirubin 08/29/2022 0.6  0.3 - 1.2 mg/dL Final   GFR, Estimated 08/29/2022 >60  >60 mL/min Final   Comment: (NOTE) Calculated using the CKD-EPI Creatinine Equation (2021)    Anion gap 08/29/2022 9  5 - 15 Final   Performed at [facility], [address] [place], [place] [address]   Phosphorus 08/29/2022 4.3  2.5 - 4.6 mg/dL Final   Performed at [facility], [address], [place], [place] [address]   Magnesium 08/29/2022 1.9  1.7 - 2.4 mg/dL Final   Performed at [facility], [address], [place], [place] [address]    Blood Alcohol level:  Lab Results  Component Value Date   [facility] <10 12/14/2022   ETH <10 12/12/2022    Metabolic Disorder Labs: Lab Results  Component Value Date   HGBA1C 7.7 (H) 12/12/2022   MPG 174.29 12/12/2022   MPG 136.98 02/10/2020   Lab Results  Component Value Date   PROLACTIN 7.0 12/12/2022   Lab Results  Component Value Date   CHOL 184 12/12/2022   TRIG 250 (H) 12/12/2022   HDL 37 (L) 12/12/2022   CHOLHDL 5.0 12/12/2022  VLDL 50 (H) 12/12/2022   LDLCALC 97 12/12/2022    Therapeutic Lab Levels: No results found for: "LITHIUM" No results found for: "VALPROATE" No results found for: "CBMZ"  Physical Findings   PHQ2-9    Flowsheet Row ED from 12/14/2022 in [facility] ED from 12/12/2022 in [facility]  PHQ-2 Total Score 0 0      Flowsheet Row ED from 12/14/2022 in [facility] Most recent reading at 12/14/2022  9:31 AM ED from 12/14/2022 in [facility] Emergency  Department at [facility] Most recent reading at 12/14/2022 12:40 AM ED from 12/12/2022 in [facility] Most recent reading at 12/12/2022  5:26 PM  C-SSRS RISK CATEGORY No Risk No Risk No Risk        Musculoskeletal  Strength & Muscle Tone: within normal limits Gait & Station: normal Patient leans: N/A  Psychiatric Specialty Exam  Presentation  General Appearance:  Appropriate for Environment; Casual  Eye Contact: Good  Speech: Clear and Coherent; Normal Rate  Speech Volume: Normal  Handedness: Right   Mood and Affect  Mood: Anxious; Depressed  Affect: Congruent   Thought Process  Thought Processes: Coherent; Linear  Descriptions of Associations:Intact  Orientation:Full (Time, Place and Person)  Thought Content:WDL  Diagnosis of Schizophrenia or Schizoaffective disorder in past: No    Hallucinations:Hallucinations: None  Ideas of Reference:None  Suicidal Thoughts:Suicidal Thoughts: No  Homicidal Thoughts:Homicidal Thoughts: No   Sensorium  Memory: Recent Good; Remote Good  Judgment: Fair  Insight: Fair   Art therapist  Concentration: Good  Attention Span: Good  Recall: Good  Fund of Knowledge: Fair  Language: Fair   Psychomotor Activity  Psychomotor Activity: Psychomotor Activity: Normal   Assets  Assets: Communication Skills; Desire for Improvement; Social Support; Housing   Sleep  Sleep: Sleep: Fair   No data recorded   Physical Exam  Physical Exam Vitals reviewed.  Constitutional:      Appearance: Normal appearance.  HENT:     Head: Normocephalic and atraumatic.  Cardiovascular:     Rate and Rhythm: Normal rate.  Pulmonary:     Effort: Pulmonary effort is normal.  Neurological:     Mental Status: He is alert.    Review of Systems  Constitutional:  Negative for diaphoresis and fever.  Gastrointestinal:  Negative for nausea and vomiting.  Musculoskeletal:   Positive for myalgias.  Neurological:  Negative for tremors.   Blood pressure 111/72, pulse 84, temperature 98.3 F (36.8 C), temperature source Oral, resp. rate 20, SpO2 98%. There is no height or weight on file to calculate BMI.  Treatment Plan Summary: JAMARION RIEKER is a 52 y.o. male patient presented to [facility] with complaints of alcohol abuse and requesting assistance with detox. 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 Encouraged him to focus on his recovery first, and he voices understanding. Plan to d/c likely Tuesday to minimize time home before going to [facility] at [facility] on Wednesday morning. Patient poses no safety concerns at this time.    Alcohol Use Disorder Alcohol Withdrawal Unfortunately, patient received opioids when he went to the ED so we will NOT continue Naltrexone. -Thiamine 100 mg PO -Multivitamin with minerals daily -Tylenol 650 mg every 6 hours as needed for pain -Zofran 4 mg every 6 hours  as needed for nausea or vomiting -Imodium 2 to 4 mg as needed for diarrhea or loose stools - Continue Gabapentin 300 mg TID - Continue Trazodone 50 mg at bedtime PRN insomnia    Tobacco Use Disorder -Nicotine patch 14 mg   Continue home meds: albuterol, amlodipine, neurontin, lisinopril   Dispo: [facility]OP at [facility] on Wednesday. Likely d/c Tuesday  Lamar Sprinkles, MD 12/15/2022 4:15 PM

## 2022-12-15 NOTE — ED Notes (Signed)
Pt A&O x 4, no distress noted, in dayroom watching TV at present, monitoring for safety.

## 2022-12-15 NOTE — ED Notes (Addendum)
Pt c/o increased anxiety and  back pain 6/10. Hydroxyzine 25mg  and Tylenol 650mg  given. Pt tolerated well. No further complaints noted. Will continue to monitor.

## 2022-12-15 NOTE — ED Notes (Signed)
Pt sleeping in no acute distress. RR even and unlabored. Environment secured. Will continue to monitor for safety. 

## 2022-12-15 NOTE — ED Notes (Signed)
 Patient was provided dinner

## 2022-12-15 NOTE — ED Notes (Signed)
Pt observed/assessed in room sleeping. RR even and unlabored, appearing in no noted distress. Environmental check complete, will continue to monitor for safety 

## 2022-12-15 NOTE — ED Notes (Signed)
Patient was provided breakfast

## 2022-12-16 DIAGNOSIS — F1090 Alcohol use, unspecified, uncomplicated: Secondary | ICD-10-CM | POA: Diagnosis not present

## 2022-12-16 NOTE — Group Note (Signed)
Group Topic: Communication  Group Date: 12/16/2022 Start Time: 2000 End Time: 2100 Facilitators: Rae Lips B  Department: [facility]  Number of Participants: 5  Group Focus: acceptance and activities of daily living skills Treatment Modality:  Individual Therapy Interventions utilized were leisure development Purpose: enhance coping skills and express feelings  Name: Kristopher [name] Date of Birth: 14-Sep-1970  MR: [id number]    Level of Participation: active Quality of Participation: attentive Interactions with others: gave feedback Mood/Affect: appropriate Triggers (if applicable): NA Cognition: coherent/clear Progress: Gaining insight Response: NA Plan: patient will be encouraged to keep going to groups.   Patients Problems:  Patient Active Problem List   Diagnosis Date Noted   Alcohol use disorder, severe, dependence (HCC) 12/12/2022   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

## 2022-12-16 NOTE — ED Notes (Signed)
Pt sitting in dayroom interacting with peers and watching tv. No acute distress noted. No concerns voiced. Informed pt to notify staff with any needs or assistance. Pt verbalized understanding or agreement. Will continue to monitor for safety. 

## 2022-12-16 NOTE — ED Notes (Signed)
Pt approached nurses station c/o lower back pain rating 8/10 and increased anxiety. Pt given Tylenol 650mg  and Vistaril 25mg  along with scheduled medication for sx relief. Informed pt to notify staff with any needs or concerns. Pt verbalized understanding and agreement. Safety maintained.

## 2022-12-16 NOTE — ED Notes (Signed)
Patient is sleeping. Respirations equal and unlabored, skin warm and dry. No change in assessment or acuity. Routine safety checks conducted according to facility protocol. Will continue to monitor for safety.   

## 2022-12-16 NOTE — ED Notes (Signed)
Pt sleeping at present, no distress noted.  Monitoring for safety. 

## 2022-12-16 NOTE — ED Notes (Signed)
Pt is in the dayroom watching TV with peers. Pt denies SI/HI/AVH. No acute distress noted. Will continue to monitor for safety. 

## 2022-12-16 NOTE — ED Notes (Addendum)
Pt sitting in bedroom on side of bed. No acute distress noted. Denies concerns or needs. Informed pt to notify staff with any needs or assistance. Pt verbalized understanding or agreement. Will continue to monitor for safety.

## 2022-12-16 NOTE — Group Note (Signed)
Group Topic: Decisional Balance/Substance Abuse  Group Date: 12/16/2022 Start Time: 1000 End Time: 1100 Facilitators: Ninfa Linden, NT +3 MHT 2 Department: [facility]  Number of Participants: 4  Group Focus: substance abuse education Treatment Modality:  Psychoeducation Interventions utilized were patient education Purpose: relapse prevention strategies  Name: Kristopher [name] Date of Birth: [birth year]  MR: [id number]    Level of Participation: Patient did not Attend Group Quality of Participation: N/A Interactions with others: N/A Mood/Affect: N/A Triggers (if applicable): N/A Cognition: N/A Progress: N/A Response: N/A Plan: N/A  Patients Problems:  Patient Active Problem List   Diagnosis Date Noted   Alcohol use disorder, severe, dependence (HCC) 12/12/2022   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

## 2022-12-16 NOTE — Group Note (Signed)
Group Topic: Wellness  Group Date: 12/16/2022 Start Time: 0900 End Time: 0945 Facilitators: Fortunato Curling, LPN  Department: [facility]  Number of Participants: 4  Group Focus: nursing group Treatment Modality:  Patient-Centered Therapy Interventions utilized were patient education Purpose: reinforce self-care  Name: Kristopher [name] Date of Birth: 14-Aug-1970  MR: [id number]    Level of Participation: active Quality of Participation: attention seeking, attentive, and cooperative Interactions with others: gave feedback Mood/Affect: appropriate Triggers (if applicable): none noted Cognition: coherent/clear, insightful, and logical Progress: Gaining insight Response: pt states that he will do everything it takes to get clean. Plan: patient will be encouraged to keep a positive attitude and to use available resources for recovery.  Patients Problems:  Patient Active Problem List   Diagnosis Date Noted   Alcohol use disorder, severe, dependence (HCC) 12/12/2022   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

## 2022-12-16 NOTE — ED Notes (Signed)
Patient A&Ox4. Denies intent to harm self/others when asked. Denies A/VH. Patient denies any physical complaints when asked. No acute distress noted. Support and encouragement provided. Routine safety checks conducted according to facility protocol. Encouraged patient to notify staff if thoughts of harm toward self or others arise. Patient verbalize understanding and agreement. Will continue to monitor for safety.    

## 2022-12-16 NOTE — ED Provider Notes (Signed)
Behavioral Health Progress Note  Date and Time: 12/16/2022 9:47 AM Name: Kristopher [name] MRN:  [id number]  Subjective:  Kristopher [name] is a 52 y.o. male patient presented to [facility] with complaints of alcohol abuse and requesting assistance with detox.   On assessment today, patient reports good mood, sleep, and appetite. He does report some anxiety surrounding the unknown regarding his family and their response to CDIOP versus residential treatment. He is advised that a member of  our team will complete safety planning with a family member and, at that time, can inform about his insurance situation preventing acceptance to a residential facility. He is Adult nurse. He does display improved insight into the need to care for himself and his sobriety before jumping back into the workforce or trying to move to [facility] with his girlfriend. Additional motivational interviewing is provided, and he is receptive. He otherwise denies acute concerns or complaints today.  He denies SI, HI, and AVH.   He denies somatic concerns or complaints as well as adverse effects of his medications. He denies alcohol cravings today.   Diagnosis:  Final diagnoses:  Alcohol use disorder    Total Time spent with patient: 30 minutes  Past Psychiatric History: AUD   Past Medical History: HTN, COPD Family History: DM Social History: lives with cousin and cousin's husband in [place], previously works as a Naval architect but recently unemployed, support system is his cousin  Additional Social History:       Sleep: Fair, improving  Appetite:  Fair, improving  Current Medications:  Current Facility-Administered Medications  Medication Dose Route Frequency Provider Last Rate Last Admin   acetaminophen (TYLENOL) tablet 650 mg  650 mg Oral Q6H PRN Kizzie Ide B, MD   650 mg at 12/16/22 0912   albuterol (VENTOLIN HFA) 108 (90 Base) MCG/ACT inhaler 1-2 puff  1-2 puff Inhalation Q6H PRN Kizzie Ide B, MD        amLODipine (NORVASC) tablet 10 mg  10 mg Oral Daily Kizzie Ide B, MD   10 mg at 12/16/22 0912   gabapentin (NEURONTIN) capsule 300 mg  300 mg Oral TID Kizzie Ide B, MD   300 mg at 12/16/22 0912   hydrOXYzine (ATARAX) tablet 25 mg  25 mg Oral Q6H PRN Kizzie Ide B, MD   25 mg at 12/16/22 0912   lisinopril (ZESTRIL) tablet 5 mg  5 mg Oral Daily Kizzie Ide B, MD   5 mg at 12/16/22 0912   loperamide (IMODIUM) capsule 2-4 mg  2-4 mg Oral PRN Lance Muss, MD       multivitamin with minerals tablet 1 tablet  1 tablet Oral Daily Kizzie Ide B, MD   1 tablet at 12/16/22 0912   nicotine (NICODERM CQ - dosed in mg/24 hours) patch 14 mg  14 mg Transdermal Daily Kizzie Ide B, MD   14 mg at 12/16/22 0912   ondansetron (ZOFRAN-ODT) disintegrating tablet 4 mg  4 mg Oral Q6H PRN Kizzie Ide B, MD       thiamine (VITAMIN B1) tablet 100 mg  100 mg Oral Daily Kizzie Ide B, MD   100 mg at 12/16/22 0912   traZODone (DESYREL) tablet 50 mg  50 mg Oral QHS PRN Kizzie Ide B, MD   50 mg at 12/15/22 2140   Current Outpatient Medications  Medication Sig Dispense Refill   albuterol (VENTOLIN HFA) 108 (90 Base) MCG/ACT inhaler Inhale 1-2 puffs into the lungs every 6 (six) hours as needed for  wheezing or shortness of breath. 8 g 2   amLODipine (NORVASC) 10 MG tablet Take 1 tablet (10 mg total) by mouth daily. 30 tablet 0   gabapentin (NEURONTIN) 300 MG capsule Take 1 capsule (300 mg total) by mouth 3 (three) times daily. 90 capsule 0   lisinopril (ZESTRIL) 5 MG tablet Take 1 tablet (5 mg total) by mouth daily. 30 tablet 0   methocarbamol (ROBAXIN) 750 MG tablet Take 1 tablet (750 mg total) by mouth 3 (three) times daily. (Patient taking differently: Take 750 mg by mouth 3 (three) times daily as needed for muscle spasms.) 20 tablet 0   oxyCODONE (OXY IR/ROXICODONE) 5 MG immediate release tablet Take 5 mg by mouth every 6 (six) hours as needed for moderate pain.      Labs  Lab Results:   Admission on 12/14/2022, Discharged on 12/14/2022  Component Date Value Ref Range Status   Sodium 12/14/2022 133 (L)  135 - 145 mmol/L Final   Potassium 12/14/2022 4.0  3.5 - 5.1 mmol/L Final   Chloride 12/14/2022 99  98 - 111 mmol/L Final   CO2 12/14/2022 26  22 - 32 mmol/L Final   Glucose, Bld 12/14/2022 215 (H)  70 - 99 mg/dL Final   Glucose reference range applies only to samples taken after fasting for at least 8 hours.   BUN 12/14/2022 10  6 - 20 mg/dL Final   Creatinine, Ser 12/14/2022 0.85  0.61 - 1.24 mg/dL Final   Calcium 16/12/9602 8.5 (L)  8.9 - 10.3 mg/dL Final   Total Protein 54/11/8117 6.5  6.5 - 8.1 g/dL Final   Albumin 14/78/2956 3.3 (L)  3.5 - 5.0 g/dL Final   AST 21/30/8657 30  15 - 41 U/L Final   ALT 12/14/2022 44  0 - 44 U/L Final   Alkaline Phosphatase 12/14/2022 80  38 - 126 U/L Final   Total Bilirubin 12/14/2022 0.6  0.3 - 1.2 mg/dL Final   GFR, Estimated 12/14/2022 >60  >60 mL/min Final   Comment: (NOTE) Calculated using the CKD-EPI Creatinine Equation (2021)    Anion gap 12/14/2022 8  5 - 15 Final   Performed at [facility], [address] [place], [place] [address]   Sodium 12/14/2022 137  135 - 145 mmol/L Final   Potassium 12/14/2022 3.9  3.5 - 5.1 mmol/L Final   Chloride 12/14/2022 100  98 - 111 mmol/L Final   BUN 12/14/2022 11  6 - 20 mg/dL Final   Creatinine, Ser 12/14/2022 0.70  0.61 - 1.24 mg/dL Final   Glucose, Bld 29/52/8413 217 (H)  70 - 99 mg/dL Final   Glucose reference range applies only to samples taken after fasting for at least 8 hours.   Calcium, Ion 12/14/2022 1.21  1.15 - 1.40 mmol/L Final   TCO2 12/14/2022 24  22 - 32 mmol/L Final   Hemoglobin 12/14/2022 14.3  13.0 - 17.0 g/dL Final   HCT 24/40/1027 42.0  39.0 - 52.0 % Final   WBC 12/14/2022 7.9  4.0 - 10.5 K/uL Final   RBC 12/14/2022 4.67  4.22 - 5.81 MIL/uL Final   Hemoglobin 12/14/2022 13.3  13.0 - 17.0 g/dL Final   HCT 25/36/6440 41.2  39.0 - 52.0 % Final   MCV  12/14/2022 88.2  80.0 - 100.0 fL Final   MCH 12/14/2022 28.5  26.0 - 34.0 pg Final   MCHC 12/14/2022 32.3  30.0 - 36.0 g/dL Final   RDW 34/74/2595 15.1  11.5 -  15.5 % Final   Platelets 12/14/2022 182  150 - 400 K/uL Final   nRBC 12/14/2022 0.0  0.0 - 0.2 % Final   Performed at [facility], [address] [place], [place] [address]   Alcohol, Ethyl (B) 12/14/2022 <10  <10 mg/dL Final   Comment: (NOTE) Lowest detectable limit for serum alcohol is 10 mg/dL.  For medical purposes only. Performed at [facility], [address] [place], [place] [address]    Lactic Acid, Venous 12/14/2022 2.0 (HH)  0.5 - 1.9 mmol/L Final   Prothrombin Time 12/14/2022 12.7  11.4 - 15.2 seconds Final   INR 12/14/2022 0.9  0.8 - 1.2 Final   Comment: (NOTE) INR goal varies based on device and disease states. Performed at [facility], [address], [place], [place] [address]    Blood Bank Specimen 12/14/2022 SAMPLE AVAILABLE FOR TESTING   Final   Sample Expiration 12/14/2022    Final                   Value:12/17/2022,2359 Performed at [facility], [address] [place], [place] [address]   Admission on 12/12/2022, Discharged on 12/12/2022  Component Date Value Ref Range Status   WBC 12/12/2022 8.1  4.0 - 10.5 K/uL Final   RBC 12/12/2022 5.00  4.22 - 5.81 MIL/uL Final   Hemoglobin 12/12/2022 14.3  13.0 - 17.0 g/dL Final   HCT 30/16/0109 43.0  39.0 - 52.0 % Final   MCV 12/12/2022 86.0  80.0 - 100.0 fL Final   MCH 12/12/2022 28.6  26.0 - 34.0 pg Final   MCHC 12/12/2022 33.3  30.0 - 36.0 g/dL Final   RDW 32/35/5732 14.8  11.5 - 15.5 % Final   Platelets 12/12/2022 200  150 - 400 K/uL Final   nRBC 12/12/2022 0.0  0.0 - 0.2 % Final   Neutrophils Relative % 12/12/2022 64  % Final   Neutro Abs 12/12/2022 5.3  1.7 - 7.7 K/uL Final   Lymphocytes Relative 12/12/2022 27  % Final   Lymphs Abs 12/12/2022 2.2  0.7 - 4.0 K/uL Final   Monocytes Relative 12/12/2022 5  % Final    Monocytes Absolute 12/12/2022 0.4  0.1 - 1.0 K/uL Final   Eosinophils Relative 12/12/2022 2  % Final   Eosinophils Absolute 12/12/2022 0.1  0.0 - 0.5 K/uL Final   Basophils Relative 12/12/2022 1  % Final   Basophils Absolute 12/12/2022 0.0  0.0 - 0.1 K/uL Final   Immature Granulocytes 12/12/2022 1  % Final   Abs Immature Granulocytes 12/12/2022 0.04  0.00 - 0.07 K/uL Final   Performed at [facility], [address], [place], [place] [address]   Sodium 12/12/2022 136  135 - 145 mmol/L Final   Potassium 12/12/2022 4.0  3.5 - 5.1 mmol/L Final   Chloride 12/12/2022 94 (L)  98 - 111 mmol/L Final   CO2 12/12/2022 27  22 - 32 mmol/L Final   Glucose, Bld 12/12/2022 141 (H)  70 - 99 mg/dL Final   Glucose reference range applies only to samples taken after fasting for at least 8 hours.   BUN 12/12/2022 6  6 - 20 mg/dL Final   Creatinine, Ser 12/12/2022 0.73  0.61 - 1.24 mg/dL Final   Calcium 27/08/2374 9.2  8.9 - 10.3 mg/dL Final   Total Protein 28/31/5176 6.8  6.5 - 8.1 g/dL Final   Albumin 16/09/3708 3.6  3.5 - 5.0 g/dL  Final   AST 12/12/2022 40  15 - 41 U/L Final   ALT 12/12/2022 59 (H)  0 - 44 U/L Final   Alkaline Phosphatase 12/12/2022 86  38 - 126 U/L Final   Total Bilirubin 12/12/2022 0.6  0.3 - 1.2 mg/dL Final   GFR, Estimated 12/12/2022 >60  >60 mL/min Final   Comment: (NOTE) Calculated using the CKD-EPI Creatinine Equation (2021)    Anion gap 12/12/2022 15  5 - 15 Final   Performed at [facility], [address], [place], [place] [address]   Hgb A1c MFr Bld 12/12/2022 7.7 (H)  4.8 - 5.6 % Final   Comment: (NOTE) Pre diabetes:          5.7%-6.4%  Diabetes:              >6.4%  Glycemic control for   <7.0% adults with diabetes    Mean Plasma Glucose 12/12/2022 174.29  mg/dL Final   Performed at [facility], [address] [place], [place] [address]   Magnesium 12/12/2022 1.7  1.7 - 2.4 mg/dL Final   Performed at [facility], [address],  [place], [place] [address]   Alcohol, Ethyl (B) 12/12/2022 <10  <10 mg/dL Final   Comment: (NOTE) Lowest detectable limit for serum alcohol is 10 mg/dL.  For medical purposes only. Performed at [facility], [address], [place], [place] [address]    Cholesterol 12/12/2022 184  0 - 200 mg/dL Final   Triglycerides 29/56/2130 250 (H)  <150 mg/dL Final   HDL 86/57/8469 37 (L)  >40 mg/dL Final   Total CHOL/HDL Ratio 12/12/2022 5.0  RATIO Final   VLDL 12/12/2022 50 (H)  0 - 40 mg/dL Final   LDL Cholesterol 12/12/2022 97  0 - 99 mg/dL Final   Comment:        Total Cholesterol/HDL:CHD Risk Coronary Heart Disease Risk Table                     Men   Women  1/2 Average Risk   3.4   3.3  Average Risk       5.0   4.4  2 X Average Risk   9.6   7.1  3 X Average Risk  23.4   11.0        Use the calculated Patient Ratio above and the CHD Risk Table to determine the patient's CHD Risk.        ATP III CLASSIFICATION (LDL):  <100     mg/dL   Optimal  629-528  mg/dL   Near or Above                    Optimal  130-159  mg/dL   Borderline  413-244  mg/dL   High  >010     mg/dL   Very High Performed at [facility], [address], [place], [place] [address]    TSH 12/12/2022 3.275  0.350 - 4.500 uIU/mL Final   Comment: Performed by a 3rd Generation assay with a functional sensitivity of <=0.01 uIU/mL. Performed at [facility], [address], [place], [place] [address]    Prolactin 12/12/2022 7.0  3.6 - 25.2 ng/mL Final   Comment: (NOTE) Performed At: [facility] [address] [place], [place] [id number] Jolene Schimke MD LO:[id number]    Color, Urine 12/12/2022 YELLOW  YELLOW Final   APPearance 12/12/2022 CLEAR  CLEAR Final   Specific Gravity, Urine 12/12/2022 1.015  1.005 - 1.030 Final   pH 12/12/2022 7.0  5.0 - 8.0 Final   Glucose, UA 12/12/2022 NEGATIVE  NEGATIVE mg/dL Final   Hgb urine dipstick 12/12/2022 NEGATIVE  NEGATIVE Final   Bilirubin Urine  12/12/2022 NEGATIVE  NEGATIVE Final   Ketones, ur 12/12/2022 NEGATIVE  NEGATIVE mg/dL Final   Protein, ur 40/98/1191 NEGATIVE  NEGATIVE mg/dL Final   Nitrite 47/82/9562 NEGATIVE  NEGATIVE Final   Leukocytes,Ua 12/12/2022 NEGATIVE  NEGATIVE Final   Performed at [facility], [address] [place], [place] [address]   POC Amphetamine UR 12/12/2022 None Detected  NONE DETECTED (Cut Off Level 1000 ng/mL) Final   POC Secobarbital (BAR) 12/12/2022 None Detected  NONE DETECTED (Cut Off Level 300 ng/mL) Final   POC Buprenorphine (BUP) 12/12/2022 None Detected  NONE DETECTED (Cut Off Level 10 ng/mL) Final   POC Oxazepam (BZO) 12/12/2022 None Detected  NONE DETECTED (Cut Off Level 300 ng/mL) Final   POC Cocaine UR 12/12/2022 None Detected  NONE DETECTED (Cut Off Level 300 ng/mL) Final   POC Methamphetamine UR 12/12/2022 None Detected  NONE DETECTED (Cut Off Level 1000 ng/mL) Final   POC Morphine 12/12/2022 None Detected  NONE DETECTED (Cut Off Level 300 ng/mL) Final   POC Methadone UR 12/12/2022 None Detected  NONE DETECTED (Cut Off Level 300 ng/mL) Final   POC Oxycodone UR 12/12/2022 None Detected  NONE DETECTED (Cut Off Level 100 ng/mL) Final   POC Marijuana UR 12/12/2022 None Detected  NONE DETECTED (Cut Off Level 50 ng/mL) Final  Admission on 08/27/2022, Discharged on 08/31/2022  Component Date Value Ref Range Status   WBC 08/27/2022 7.6  4.0 - 10.5 K/uL Final   RBC 08/27/2022 5.45  4.22 - 5.81 MIL/uL Final   Hemoglobin 08/27/2022 15.6  13.0 - 17.0 g/dL Final   HCT 57/84/6962 46.1  39.0 - 52.0 % Final   MCV 08/27/2022 84.6  80.0 - 100.0 fL Final   MCH 08/27/2022 28.6  26.0 - 34.0 pg Final   MCHC 08/27/2022 33.8  30.0 - 36.0 g/dL Final   RDW 95/28/4132 13.9  11.5 - 15.5 % Final   Platelets 08/27/2022 221  150 - 400 K/uL Final   nRBC 08/27/2022 0.0  0.0 - 0.2 % Final   Neutrophils Relative % 08/27/2022 60  % Final   Neutro Abs 08/27/2022 4.6  1.7 - 7.7 K/uL Final   Lymphocytes Relative  08/27/2022 32  % Final   Lymphs Abs 08/27/2022 2.5  0.7 - 4.0 K/uL Final   Monocytes Relative 08/27/2022 5  % Final   Monocytes Absolute 08/27/2022 0.4  0.1 - 1.0 K/uL Final   Eosinophils Relative 08/27/2022 2  % Final   Eosinophils Absolute 08/27/2022 0.1  0.0 - 0.5 K/uL Final   Basophils Relative 08/27/2022 1  % Final   Basophils Absolute 08/27/2022 0.1  0.0 - 0.1 K/uL Final   Immature Granulocytes 08/27/2022 0  % Final   Abs Immature Granulocytes 08/27/2022 0.02  0.00 - 0.07 K/uL Final   Performed at [facility], [address], [place], [place] [address]   Sodium 08/27/2022 133 (L)  135 - 145 mmol/L Final   Potassium 08/27/2022 3.9  3.5 - 5.1 mmol/L Final   Chloride 08/27/2022 100  98 - 111 mmol/L Final   CO2 08/27/2022 24  22 - 32 mmol/L Final   Glucose, Bld 08/27/2022 142 (H)  70 - 99 mg/dL Final  Glucose reference range applies only to samples taken after fasting for at least 8 hours.   BUN 08/27/2022 8  6 - 20 mg/dL Final   Creatinine, Ser 08/27/2022 0.80  0.61 - 1.24 mg/dL Final   Calcium 16/12/9602 9.0  8.9 - 10.3 mg/dL Final   Total Protein 54/11/8117 7.4  6.5 - 8.1 g/dL Final   Albumin 14/78/2956 3.9  3.5 - 5.0 g/dL Final   AST 21/30/8657 26  15 - 41 U/L Final   ALT 08/27/2022 28  0 - 44 U/L Final   Alkaline Phosphatase 08/27/2022 48  38 - 126 U/L Final   Total Bilirubin 08/27/2022 0.7  0.3 - 1.2 mg/dL Final   GFR, Estimated 08/27/2022 >60  >60 mL/min Final   Comment: (NOTE) Calculated using the CKD-EPI Creatinine Equation (2021)    Anion gap 08/27/2022 9  5 - 15 Final   Performed at [facility], [address], [place], [place] [address]   Specimen Source 08/27/2022 URINE, CLEAN CATCH   Final   Color, Urine 08/27/2022 YELLOW  YELLOW Final   APPearance 08/27/2022 CLEAR  CLEAR Final   Specific Gravity, Urine 08/27/2022 1.011  1.005 - 1.030 Final   pH 08/27/2022 5.0  5.0 - 8.0 Final   Glucose, UA 08/27/2022 NEGATIVE  NEGATIVE mg/dL Final   Hgb urine  dipstick 08/27/2022 NEGATIVE  NEGATIVE Final   Bilirubin Urine 08/27/2022 NEGATIVE  NEGATIVE Final   Ketones, ur 08/27/2022 NEGATIVE  NEGATIVE mg/dL Final   Protein, ur 29/52/8413 NEGATIVE  NEGATIVE mg/dL Final   Nitrite 24/40/1027 NEGATIVE  NEGATIVE Final   Leukocytes,Ua 08/27/2022 NEGATIVE  NEGATIVE Final   RBC / HPF 08/27/2022 0-5  0 - 5 RBC/hpf Final   WBC, UA 08/27/2022 0-5  0 - 5 WBC/hpf Final   Comment:        Reflex urine culture not performed if WBC <=10, OR if Squamous epithelial cells >5. If Squamous epithelial cells >5 suggest recollection.    Bacteria, UA 08/27/2022 NONE SEEN  NONE SEEN Final   Squamous Epithelial / HPF 08/27/2022 0-5  0 - 5 /HPF Final   Mucus 08/27/2022 PRESENT   Final   Performed at [facility], [address], [place], [place] [address]   Opiates 08/27/2022 POSITIVE (A)  NONE DETECTED Final   Cocaine 08/27/2022 NONE DETECTED  NONE DETECTED Final   Benzodiazepines 08/27/2022 NONE DETECTED  NONE DETECTED Final   Amphetamines 08/27/2022 NONE DETECTED  NONE DETECTED Final   Tetrahydrocannabinol 08/27/2022 NONE DETECTED  NONE DETECTED Final   Barbiturates 08/27/2022 NONE DETECTED  NONE DETECTED Final   Comment: (NOTE) DRUG SCREEN FOR MEDICAL PURPOSES ONLY.  IF CONFIRMATION IS NEEDED FOR ANY PURPOSE, NOTIFY LAB WITHIN 5 DAYS.  LOWEST DETECTABLE LIMITS FOR URINE DRUG SCREEN Drug Class                     Cutoff (ng/mL) Amphetamine and metabolites    1000 Barbiturate and metabolites    200 Benzodiazepine                 200 Opiates and metabolites        300 Cocaine and metabolites        300 THC                            50 Performed at [facility], [address], [place], [place] [address]  Magnesium 08/27/2022 1.9  1.7 - 2.4 mg/dL Final   Performed at [facility], [address], [place], [place] [address]   WBC 08/28/2022 9.0  4.0 - 10.5 K/uL Final   RBC 08/28/2022 5.27  4.22 - 5.81 MIL/uL Final   Hemoglobin  08/28/2022 14.6  13.0 - 17.0 g/dL Final   HCT 78/29/5621 44.5  39.0 - 52.0 % Final   MCV 08/28/2022 84.4  80.0 - 100.0 fL Final   MCH 08/28/2022 27.7  26.0 - 34.0 pg Final   MCHC 08/28/2022 32.8  30.0 - 36.0 g/dL Final   RDW 30/86/5784 14.0  11.5 - 15.5 % Final   Platelets 08/28/2022 242  150 - 400 K/uL Final   nRBC 08/28/2022 0.0  0.0 - 0.2 % Final   Neutrophils Relative % 08/28/2022 66  % Final   Neutro Abs 08/28/2022 6.0  1.7 - 7.7 K/uL Final   Lymphocytes Relative 08/28/2022 26  % Final   Lymphs Abs 08/28/2022 2.3  0.7 - 4.0 K/uL Final   Monocytes Relative 08/28/2022 6  % Final   Monocytes Absolute 08/28/2022 0.5  0.1 - 1.0 K/uL Final   Eosinophils Relative 08/28/2022 1  % Final   Eosinophils Absolute 08/28/2022 0.1  0.0 - 0.5 K/uL Final   Basophils Relative 08/28/2022 1  % Final   Basophils Absolute 08/28/2022 0.1  0.0 - 0.1 K/uL Final   Immature Granulocytes 08/28/2022 0  % Final   Abs Immature Granulocytes 08/28/2022 0.03  0.00 - 0.07 K/uL Final   Performed at [facility], [address] [place], [place] [address]   Sodium 08/28/2022 133 (L)  135 - 145 mmol/L Final   Potassium 08/28/2022 3.8  3.5 - 5.1 mmol/L Final   Chloride 08/28/2022 99  98 - 111 mmol/L Final   CO2 08/28/2022 26  22 - 32 mmol/L Final   Glucose, Bld 08/28/2022 107 (H)  70 - 99 mg/dL Final   Glucose reference range applies only to samples taken after fasting for at least 8 hours.   BUN 08/28/2022 14  6 - 20 mg/dL Final   Creatinine, Ser 08/28/2022 0.85  0.61 - 1.24 mg/dL Final   Calcium 52/84/1324 9.0  8.9 - 10.3 mg/dL Final   Total Protein 40/12/2723 6.7  6.5 - 8.1 g/dL Final   Albumin 36/64/4034 3.8  3.5 - 5.0 g/dL Final   AST 74/25/9563 31  15 - 41 U/L Final   ALT 08/28/2022 34  0 - 44 U/L Final   Alkaline Phosphatase 08/28/2022 52  38 - 126 U/L Final   Total Bilirubin 08/28/2022 0.6  0.3 - 1.2 mg/dL Final   GFR, Estimated 08/28/2022 >60  >60 mL/min Final   Comment: (NOTE) Calculated using the  CKD-EPI Creatinine Equation (2021)    Anion gap 08/28/2022 8  5 - 15 Final   Performed at [facility], [address] [place] [address] [place], [place] [address]   Magnesium 08/28/2022 2.0  1.7 - 2.4 mg/dL Final   Performed at [facility], [address] [place], [place] [address]   Phosphorus 08/28/2022 4.3  2.5 - 4.6 mg/dL Final   Performed at [facility], [address] [place], [place] [address]   WBC 08/29/2022 7.5  4.0 - 10.5 K/uL Final   RBC 08/29/2022 4.71  4.22 - 5.81 MIL/uL Final   Hemoglobin 08/29/2022 13.8  13.0 - 17.0 g/dL Final   HCT 66/08/3014 40.5  39.0 - 52.0 %  Final   MCV 08/29/2022 86.0  80.0 - 100.0 fL Final   MCH 08/29/2022 29.3  26.0 - 34.0 pg Final   MCHC 08/29/2022 34.1  30.0 - 36.0 g/dL Final   RDW 60/45/4098 13.8  11.5 - 15.5 % Final   Platelets 08/29/2022 212  150 - 400 K/uL Final   nRBC 08/29/2022 0.0  0.0 - 0.2 % Final   Neutrophils Relative % 08/29/2022 48  % Final   Neutro Abs 08/29/2022 3.7  1.7 - 7.7 K/uL Final   Lymphocytes Relative 08/29/2022 40  % Final   Lymphs Abs 08/29/2022 3.0  0.7 - 4.0 K/uL Final   Monocytes Relative 08/29/2022 7  % Final   Monocytes Absolute 08/29/2022 0.5  0.1 - 1.0 K/uL Final   Eosinophils Relative 08/29/2022 4  % Final   Eosinophils Absolute 08/29/2022 0.3  0.0 - 0.5 K/uL Final   Basophils Relative 08/29/2022 1  % Final   Basophils Absolute 08/29/2022 0.1  0.0 - 0.1 K/uL Final   Immature Granulocytes 08/29/2022 0  % Final   Abs Immature Granulocytes 08/29/2022 0.02  0.00 - 0.07 K/uL Final   Performed at [facility], [address], [place], [place] [address]   Sodium 08/29/2022 135  135 - 145 mmol/L Final   Potassium 08/29/2022 4.0  3.5 - 5.1 mmol/L Final   Chloride 08/29/2022 99  98 - 111 mmol/L Final   CO2 08/29/2022 27  22 - 32 mmol/L Final   Glucose, Bld 08/29/2022 179 (H)  70 - 99 mg/dL Final   Glucose reference range applies only to samples taken after fasting for at least 8 hours.   BUN 08/29/2022  13  6 - 20 mg/dL Final   Creatinine, Ser 08/29/2022 0.99  0.61 - 1.24 mg/dL Final   Calcium 78/29/5621 9.1  8.9 - 10.3 mg/dL Final   Total Protein 30/86/5784 6.6  6.5 - 8.1 g/dL Final   Albumin 69/62/9528 3.6  3.5 - 5.0 g/dL Final   AST 41/32/4401 29  15 - 41 U/L Final   ALT 08/29/2022 34  0 - 44 U/L Final   Alkaline Phosphatase 08/29/2022 51  38 - 126 U/L Final   Total Bilirubin 08/29/2022 0.6  0.3 - 1.2 mg/dL Final   GFR, Estimated 08/29/2022 >60  >60 mL/min Final   Comment: (NOTE) Calculated using the CKD-EPI Creatinine Equation (2021)    Anion gap 08/29/2022 9  5 - 15 Final   Performed at [facility], [address] [place], [place] [address]   Phosphorus 08/29/2022 4.3  2.5 - 4.6 mg/dL Final   Performed at [facility], [address] [place], [place] [address]   Magnesium 08/29/2022 1.9  1.7 - 2.4 mg/dL Final   Performed at [facility], [address], [place], [place] [address]    Blood Alcohol level:  Lab Results  Component Value Date   [facility] <10 12/14/2022   ETH <10 12/12/2022    Metabolic Disorder Labs: Lab Results  Component Value Date   HGBA1C 7.7 (H) 12/12/2022   MPG 174.29 12/12/2022   MPG 136.98 02/10/2020   Lab Results  Component Value Date   PROLACTIN 7.0 12/12/2022   Lab Results  Component Value Date   CHOL 184 12/12/2022   TRIG 250 (H) 12/12/2022   HDL 37 (L) 12/12/2022   CHOLHDL 5.0 12/12/2022   VLDL 50 (H) 12/12/2022   LDLCALC 97 12/12/2022    Therapeutic Lab Levels: No  results found for: "LITHIUM" No results found for: "VALPROATE" No results found for: "CBMZ"  Physical Findings   PHQ2-9    Flowsheet Row ED from 12/14/2022 in [facility] ED from 12/12/2022 in [facility]  PHQ-2 Total Score 0 0      Flowsheet Row ED from 12/14/2022 in [facility] Most recent reading at 12/14/2022  9:31 AM ED from 12/14/2022 in [facility] at [facility] Most recent reading at 12/14/2022 12:40 AM ED from 12/12/2022 in [facility] Most recent reading at 12/12/2022  5:26 PM  C-SSRS RISK CATEGORY No Risk No Risk No Risk        Musculoskeletal  Strength & Muscle Tone: within normal limits Gait & Station: normal Patient leans: N/A  Psychiatric Specialty Exam  Presentation  General Appearance:  Appropriate for Environment; Casual  Eye Contact: Good  Speech: Clear and Coherent; Normal Rate  Speech Volume: Normal  Handedness: Right   Mood and Affect  Mood: Anxious; Depressed  Affect: Congruent   Thought Process  Thought Processes: Coherent; Linear  Descriptions of Associations:Intact  Orientation:Full (Time, Place and Person)  Thought Content:WDL  Diagnosis of Schizophrenia or Schizoaffective disorder in past: No    Hallucinations:Hallucinations: None  Ideas of Reference:None  Suicidal Thoughts:Suicidal Thoughts: No  Homicidal Thoughts:Homicidal Thoughts: No   Sensorium  Memory: Recent Good; Remote Good  Judgment: Fair  Insight: Fair   Art therapist  Concentration: Good  Attention Span: Good  Recall: Good  Fund of Knowledge: Fair  Language: Fair   Psychomotor Activity  Psychomotor Activity: Psychomotor Activity: Normal   Assets  Assets: Communication Skills; Desire for Improvement; Social Support; Housing   Sleep  Sleep: Sleep: Fair   No data recorded   Physical Exam  Physical Exam Vitals reviewed.  Constitutional:      Appearance: Normal appearance.  HENT:     Head: Normocephalic and atraumatic.  Cardiovascular:     Rate and Rhythm: Normal rate.  Pulmonary:     Effort: Pulmonary effort is normal.  Neurological:     Mental Status: He is alert.    Review of Systems  Constitutional:  Negative for diaphoresis and fever.  Gastrointestinal:  Negative for nausea and vomiting.  Musculoskeletal:   Positive for myalgias.  Neurological:  Negative for tremors.   Blood pressure 131/77, pulse 76, temperature 98.2 F (36.8 C), temperature source Oral, resp. rate 18, SpO2 96%. There is no height or weight on file to calculate BMI.  Treatment Plan Summary: BEJAMIN GREENSLADE is a 52 y.o. male patient presented to [facility] with complaints of alcohol abuse and requesting assistance with detox. 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 He reflected on his relationship, his past health complications 2/2 alcohol consumption, and the desire to maintain a clean driving record to continue working and verbalizes the need to focus on his recovery first, without prompting. Plan to d/c tomorrow, 9/23, to minimize time home before going to CDIOP at [facility] on Tuesday morning. Patient poses no safety concerns at this time.    Alcohol Use Disorder Alcohol Withdrawal Unfortunately, patient received opioids when he went to the ED so we will NOT continue Naltrexone. Otherwise will continue: -Thiamine 100 mg PO -Multivitamin with minerals daily -Tylenol 650 mg every 6 hours as needed for pain -Zofran 4  mg every 6 hours as needed for nausea or vomiting -Imodium 2 to 4 mg as needed for diarrhea or loose stools - Continue Gabapentin 300 mg TID - Continue Trazodone 50 mg at bedtime PRN insomnia    Tobacco Use Disorder -Nicotine patch 14 mg   Continue home meds: albuterol, amlodipine, neurontin, lisinopril   Dispo: CDIOP at [facility] on Tuesday, 9/24. D/c home on 9/23.  Lamar Sprinkles, MD 12/16/2022 9:47 AM

## 2022-12-17 DIAGNOSIS — F1090 Alcohol use, unspecified, uncomplicated: Secondary | ICD-10-CM | POA: Diagnosis not present

## 2022-12-17 MED ORDER — GABAPENTIN 300 MG PO CAPS: 300.0000 mg | ORAL_CAPSULE | Freq: Three times a day (TID) | ORAL | 0 refills | Status: DC

## 2022-12-17 MED ORDER — TRAZODONE HCL 50 MG PO TABS: 50.0000 mg | ORAL_TABLET | Freq: Every evening | ORAL | 0 refills | Status: DC | PRN

## 2022-12-17 MED ORDER — NICOTINE 14 MG/24HR TD PT24: 14.0000 mg | MEDICATED_PATCH | Freq: Every day | TRANSDERMAL | 0 refills | Status: DC

## 2022-12-17 NOTE — ED Notes (Signed)
Pt c/o lower back aches 7/10 and increased anxiety. Tylenol and Vistaril given for sx relief. Safety maintained.

## 2022-12-17 NOTE — ED Notes (Signed)
Patient A&Ox4. Denies intent to harm self/others when asked. Denies A/VH. Patient denies any physical complaints when asked. No acute distress noted. Support and encouragement provided. Routine safety checks conducted according to facility protocol. Encouraged patient to notify staff if thoughts of harm toward self or others arise. Patient verbalize understanding and agreement. Will continue to monitor for safety.

## 2022-12-17 NOTE — Discharge Planning (Signed)
LCSW was provided an update by MD that patient will be discharging back home with his cousin on today with plans to follow up at [facility] on tomorrow at 9:00am for intake appt for CDIOP. Patient aware of additional resources for further treatment as needed. No concerns were reported by the patient at this time. LCSW to sign off. Please inform if further LCSW needs arise prior to discharge.    Fernande Boyden, LCSW Clinical Social Worker [facility] Ph: [phone]

## 2022-12-17 NOTE — Group Note (Signed)
Group Topic: Communication  Group Date: 12/17/2022 Start Time: 0900 End Time: 0950 Facilitators: Prentice Docker, RN  Department: [facility]  Number of Participants: 6  Group Focus: other medication education Treatment Modality:  Individual Therapy Interventions utilized were patient education Purpose: increase insight  Name: Kristopher [name] Date of Birth: 12-Nov-1970  MR: [id number]      Patients Problems:  Level of Participation: active Quality of Participation: cooperative Interactions with others: individual communication Mood/Affect: appropriate Triggers (if applicable): n/a Cognition: coherent/clear Progress: Gaining insight Response: verbalized understanding of medication administered Plan: patient will be encouraged to continue medication and tx compliance  Patients Problems:  Patient Active Problem List   Diagnosis Date Noted   Alcohol dependence (HCC) 12/16/2022   GAD (generalized anxiety disorder) 01/05/2011

## 2022-12-17 NOTE — ED Notes (Signed)
Pt approached nurses station requesting cousin, Tawanna Cooler, phone number, after learning that he was not allowed to return to his cousin's home upon discharge from [facility]. Pt's cousin confirmed that pt was not able to return to his home. Pt requested to speak with provider and requested to extend his stay another day to "come up with a plan B because I don't want to go to a shelter or end up in the streets". Provider and SW spoke with pt and informed pt that he could discharge later today or early in the am due to having an appt at 0900 for CD IOP tx. Pt verbalized understanding. Pt currently working on finding a place to secure residency. Will continue to monitor for safety.

## 2022-12-17 NOTE — Discharge Planning (Signed)
LCSW contacted the patient's cousin Narda Amber [phone] regarding disposition plan. Cousin immediately stated that patient is no longer welcome in his home so he will not be returning back. Cousin reports "he will have to figure it out". Cousin reports he is a recovering alcohol, and caring for the patient has put a strain on him that he is not willing to bare anymore. Cousin reports he thought the plan was for the patient to go to [facility]. Cousin made aware that [facility] was not an option due to insurance. Cousin reported "well, he's going to have to figure it out because he's not coming back into my home". LCSW expressed understanding and appreciation for conversation and update. LCSW will speak with patient to inform.   LCSW went and spoke with patient to provide an update. Patient reports some frustration regarding his cousin decision, however reports he will try to figure it out. Option for [employer] was provided to the patient and patient reports that will not work for him and the type of work he does. Patient also aware that the only other alternative would be a local shelter, and patient reports he will not be doing that. Patient reports he wants to follow up with his girlfriend to see if he can come up with a plan. LCSW will provide updates as received.   Fernande Boyden, LCSW Clinical Social Worker [facility] Ph: [phone]

## 2022-12-17 NOTE — Group Note (Signed)
Group Topic: Recovery Basics  Group Date: 12/17/2022 Start Time: 1000 End Time: 1050 Facilitators: Priscille Kluver, NT  Department: [facility]  Number of Participants: 5  Group Focus: community group Treatment Modality:  Solution-Focused Therapy Interventions utilized were support Purpose: enhance coping skills and relapse prevention strategies  Name: Kristopher [name] Date of Birth: 1970-08-14  MR: [id number]    Level of Participation: active Quality of Participation: attentive Interactions with others: gave feedback Mood/Affect: positive Cognition: insightful Progress: Moderate  Patients Problems:  Patient Active Problem List   Diagnosis Date Noted   Alcohol use disorder, severe, dependence (HCC) 12/12/2022   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

## 2022-12-17 NOTE — ED Provider Notes (Cosign Needed Addendum)
[facility] ASAP Discharge Summary  Date and Time: 12/17/2022 11:22 AM  Name: Kristopher [name]  MRN:  [id number]   Discharge Diagnoses:  Final diagnoses:  Alcohol use disorder    Subjective: Kristopher [name] is a 52 y.o. male patient presented to [facility] with complaints of alcohol abuse and requesting assistance with detox.   Patient seen this AM. He notes feeling well. He reports good sleep and appetite. He denies withdrawal symptoms and adverse effects from his medications. He denies SI, HI, and AVH. He agrees to discharge today with the plan to go to [facility] for CDIOP services tomorrow AM. He feelings thankful for his time spent in [facility] and feels like he learned coping skills regarding substance use while he was here.  Stay Summary: The patient was evaluated each day by a clinical provider to ascertain response to treatment. Improvement was noted by the patient's report of decreasing symptoms, improved sleep and appetite, affect, medication tolerance, behavior, and participation in unit programming.  Patient was asked each day to complete a self inventory noting mood, mental status, pain, new symptoms, anxiety and concerns.  The patient's medications were managed with the following directions: Librium taper completed Restarted home albuterol, amlodipine, gabapentin, lisinopril Started naltrexone and d/c after returning from the ED  Patient responded well to medication and being in a therapeutic and supportive environment. Positive and appropriate behavior was noted and the patient was motivated for recovery. The patient worked closely with the treatment team and case manager to develop a discharge plan with appropriate goals. Coping skills, problem solving as well as relaxation therapies were also part of the unit programming.   Total Time spent with patient: 20 minutes  Past Psychiatric History: AUD  Past Medical History: HTN, COPD Family History: DM Social History: lives with cousin and cousin's  husband in [place], previously works as a Naval architect but recently unemployed, support system is his cousin  Current Medications:  Current Facility-Administered Medications  Medication Dose Route Frequency Provider Last Rate Last Admin   acetaminophen (TYLENOL) tablet 650 mg  650 mg Oral Q6H PRN Kizzie Ide B, MD   650 mg at 12/17/22 0910   albuterol (VENTOLIN HFA) 108 (90 Base) MCG/ACT inhaler 1-2 puff  1-2 puff Inhalation Q6H PRN Kizzie Ide B, MD       amLODipine (NORVASC) tablet 10 mg  10 mg Oral Daily Kizzie Ide B, MD   10 mg at 12/17/22 0910   gabapentin (NEURONTIN) capsule 300 mg  300 mg Oral TID Kizzie Ide B, MD   300 mg at 12/17/22 0910   hydrOXYzine (ATARAX) tablet 25 mg  25 mg Oral Q6H PRN Kizzie Ide B, MD   25 mg at 12/17/22 0910   lisinopril (ZESTRIL) tablet 5 mg  5 mg Oral Daily Kizzie Ide B, MD   5 mg at 12/17/22 0910   loperamide (IMODIUM) capsule 2-4 mg  2-4 mg Oral PRN Lance Muss, MD       multivitamin with minerals tablet 1 tablet  1 tablet Oral Daily Kizzie Ide B, MD   1 tablet at 12/17/22 0910   nicotine (NICODERM CQ - dosed in mg/24 hours) patch 14 mg  14 mg Transdermal Daily Kizzie Ide B, MD   14 mg at 12/17/22 0909   ondansetron (ZOFRAN-ODT) disintegrating tablet 4 mg  4 mg Oral Q6H PRN Kizzie Ide B, MD       thiamine (VITAMIN B1) tablet 100 mg  100 mg Oral Daily Horace, Kasandra Fehr B,  MD   100 mg at 12/17/22 0910   traZODone (DESYREL) tablet 50 mg  50 mg Oral QHS PRN Lance Muss, MD   50 mg at 12/16/22 2111   Current Outpatient Medications  Medication Sig Dispense Refill   albuterol (VENTOLIN HFA) 108 (90 Base) MCG/ACT inhaler Inhale 1-2 puffs into the lungs every 6 (six) hours as needed for wheezing or shortness of breath. 8 g 2   amLODipine (NORVASC) 10 MG tablet Take 1 tablet (10 mg total) by mouth daily. 30 tablet 0   gabapentin (NEURONTIN) 300 MG capsule Take 1 capsule (300 mg total) by mouth 3 (three) times daily. 90 capsule 0    lisinopril (ZESTRIL) 5 MG tablet Take 1 tablet (5 mg total) by mouth daily. 30 tablet 0   methocarbamol (ROBAXIN) 750 MG tablet Take 1 tablet (750 mg total) by mouth 3 (three) times daily. (Patient taking differently: Take 750 mg by mouth 3 (three) times daily as needed for muscle spasms.) 20 tablet 0   [START ON 12/18/2022] nicotine (NICODERM CQ - DOSED IN MG/24 HOURS) 14 mg/24hr patch Place 1 patch (14 mg total) onto the skin daily. 28 patch 0   traZODone (DESYREL) 50 MG tablet Take 1 tablet (50 mg total) by mouth at bedtime as needed for sleep. 30 tablet 0    PTA Medications:  Facility Ordered Medications  Medication   [COMPLETED] HYDROmorphone (DILAUDID) injection 1 mg   [COMPLETED] HYDROmorphone (DILAUDID) injection 1 mg   [COMPLETED] LORazepam (ATIVAN) injection 2 mg   [COMPLETED] dexamethasone (DECADRON) injection 10 mg   [COMPLETED] HYDROmorphone (DILAUDID) injection 1 mg   [COMPLETED] HYDROmorphone (DILAUDID) injection 1 mg   [COMPLETED] oxyCODONE (Oxy IR/ROXICODONE) immediate release tablet 5 mg   albuterol (VENTOLIN HFA) 108 (90 Base) MCG/ACT inhaler 1-2 puff   amLODipine (NORVASC) tablet 10 mg   gabapentin (NEURONTIN) capsule 300 mg   lisinopril (ZESTRIL) tablet 5 mg   thiamine (VITAMIN B1) tablet 100 mg   multivitamin with minerals tablet 1 tablet   hydrOXYzine (ATARAX) tablet 25 mg   loperamide (IMODIUM) capsule 2-4 mg   ondansetron (ZOFRAN-ODT) disintegrating tablet 4 mg   acetaminophen (TYLENOL) tablet 650 mg   traZODone (DESYREL) tablet 50 mg   nicotine (NICODERM CQ - dosed in mg/24 hours) patch 14 mg   PTA Medications  Medication Sig   albuterol (VENTOLIN HFA) 108 (90 Base) MCG/ACT inhaler Inhale 1-2 puffs into the lungs every 6 (six) hours as needed for wheezing or shortness of breath.   amLODipine (NORVASC) 10 MG tablet Take 1 tablet (10 mg total) by mouth daily.   lisinopril (ZESTRIL) 5 MG tablet Take 1 tablet (5 mg total) by mouth daily.   methocarbamol  (ROBAXIN) 750 MG tablet Take 1 tablet (750 mg total) by mouth 3 (three) times daily. (Patient taking differently: Take 750 mg by mouth 3 (three) times daily as needed for muscle spasms.)   [START ON 12/18/2022] nicotine (NICODERM CQ - DOSED IN MG/24 HOURS) 14 mg/24hr patch Place 1 patch (14 mg total) onto the skin daily.   traZODone (DESYREL) 50 MG tablet Take 1 tablet (50 mg total) by mouth at bedtime as needed for sleep.   gabapentin (NEURONTIN) 300 MG capsule Take 1 capsule (300 mg total) by mouth 3 (three) times daily.       12/17/2022    9:29 AM 12/14/2022   11:24 AM 12/13/2022   10:51 AM  Depression screen PHQ 2/9  Decreased Interest 0 0 0  Down, Depressed,  Hopeless 0 0 0  PHQ - 2 Score 0 0 0    Flowsheet Row ED from 12/14/2022 in [facility] Most recent reading at 12/14/2022  9:31 AM ED from 12/14/2022 in [facility] Emergency Department at [facility] Most recent reading at 12/14/2022 12:40 AM ED from 12/12/2022 in [facility] Most recent reading at 12/12/2022  5:26 PM  C-SSRS RISK CATEGORY No Risk No Risk No Risk       Musculoskeletal  Strength & Muscle Tone: within normal limits Gait & Station: normal Patient leans: N/A  Psychiatric Specialty Exam  Presentation  General Appearance:  Appropriate for Environment; Casual  Eye Contact: Good  Speech: Clear and Coherent; Normal Rate  Speech Volume: Normal  Handedness: Right   Mood and Affect  Mood: Anxious  Affect: Congruent; Appropriate   Thought Process  Thought Processes: Coherent; Goal Directed; Linear  Descriptions of Associations:Intact  Orientation:Full (Time, Place and Person)  Thought Content:Logical; WDL  Diagnosis of Schizophrenia or Schizoaffective disorder in past: No    Hallucinations:Hallucinations: None  Ideas of Reference:None  Suicidal Thoughts:Suicidal Thoughts: No  Homicidal Thoughts:Homicidal Thoughts:  No   Sensorium  Memory: Remote Good  Judgment: Fair  Insight: Fair   Art therapist  Concentration: Good  Attention Span: Good  Recall: Good  Fund of Knowledge: Good  Language: Good   Psychomotor Activity  Psychomotor Activity:Psychomotor Activity: Normal   Assets  Assets: Communication Skills; Desire for Improvement; Social Support; Housing   Sleep  Sleep:Sleep: Fair    Physical Exam  Physical Exam Vitals reviewed.  Constitutional:      Appearance: Normal appearance.  HENT:     Head: Normocephalic and atraumatic.  Cardiovascular:     Rate and Rhythm: Normal rate.  Pulmonary:     Effort: Pulmonary effort is normal.  Musculoskeletal:        General: Normal range of motion.  Neurological:     Mental Status: He is alert.    Review of Systems  Constitutional:  Negative for chills and fever.  Respiratory:  Negative for cough and hemoptysis.   Cardiovascular:  Negative for chest pain and palpitations.  Gastrointestinal:  Negative for nausea and vomiting.   Blood pressure 118/70, pulse 83, temperature 97.7 F (36.5 C), temperature source Oral, resp. rate 18, SpO2 95%. There is no height or weight on file to calculate BMI.  Demographic Factors:  Male and Caucasian  Loss Factors: Financial problems/change in socioeconomic status  Historical Factors: Impulsivity  Risk Reduction Factors:   Positive coping skills or problem solving skills  Continued Clinical Symptoms:  Alcohol/Substance Abuse/Dependencies  Cognitive Features That Contribute To Risk:  Closed-mindedness    Suicide Risk:  Mild:  Suicidal ideation of limited frequency, intensity, duration, and specificity.  There are no identifiable plans, no associated intent, mild dysphoria and related symptoms, good self-control (both objective and subjective assessment), few other risk factors, and identifiable protective factors, including available and accessible social  support.  Plan Of Care/Follow-up recommendations:  Activity as tolerated Regular diet Continue prescription medications See PCP for medical conditions   Disposition: [facility] Ricky Stabs, MD 12/17/2022, 11:22 AM

## 2022-12-17 NOTE — ED Provider Notes (Signed)
Behavioral Health Progress Note  Date and Time: 12/17/2022 1:20 PM Name: Kristopher [name] MRN:  [id number]  Subjective:  Kristopher [name] is a 52 y.o. male patient [facility] of alcohol abuse and requesting assistance with detox.    Patient seen this AM. He notes feeling well. He reports good sleep and appetite. He denies withdrawal symptoms and adverse effects from his medications. He denies SI, HI, and AVH. He initially planned to go to his cousin's home then CDIOP tomorrow; however, his cousin denied him from his home. Patient now wants to pursue [facility] for residential treatment. He feels upset with his cousin as he thought he could return. He feelings thankful for his time spent in [facility] and feels like he learned coping skills regarding substance use while he was here.   Diagnosis:  Final diagnoses:  Alcohol use disorder    Total Time spent with patient: 30 minutes  Past Psychiatric History: AUD   Past Medical History: HTN, COPD Family History: DM Social History: lives with cousin and cousin's husband in [place], previously works as a Naval architect but recently unemployed, support system is his cousin  Additional Social History:       Sleep: Fair, improving  Appetite:  Fair, improving  Current Medications:  Current Facility-Administered Medications  Medication Dose Route Frequency Provider Last Rate Last Admin   acetaminophen (TYLENOL) tablet 650 mg  650 mg Oral Q6H PRN Kizzie Ide B, MD   650 mg at 12/17/22 0910   albuterol (VENTOLIN HFA) 108 (90 Base) MCG/ACT inhaler 1-2 puff  1-2 puff Inhalation Q6H PRN Kizzie Ide B, MD       amLODipine (NORVASC) tablet 10 mg  10 mg Oral Daily Kizzie Ide B, MD   10 mg at 12/17/22 0910   gabapentin (NEURONTIN) capsule 300 mg  300 mg Oral TID Kizzie Ide B, MD   300 mg at 12/17/22 0910   lisinopril (ZESTRIL) tablet 5 mg  5 mg Oral Daily Kizzie Ide B, MD   5 mg at 12/17/22 0910   multivitamin with minerals  tablet 1 tablet  1 tablet Oral Daily Kizzie Ide B, MD   1 tablet at 12/17/22 0910   nicotine (NICODERM CQ - dosed in mg/24 hours) patch 14 mg  14 mg Transdermal Daily Kizzie Ide B, MD   14 mg at 12/17/22 5784   thiamine (VITAMIN B1) tablet 100 mg  100 mg Oral Daily Kizzie Ide B, MD   100 mg at 12/17/22 0910   traZODone (DESYREL) tablet 50 mg  50 mg Oral QHS PRN Kizzie Ide B, MD   50 mg at 12/16/22 2111   Current Outpatient Medications  Medication Sig Dispense Refill   albuterol (VENTOLIN HFA) 108 (90 Base) MCG/ACT inhaler Inhale 1-2 puffs into the lungs every 6 (six) hours as needed for wheezing or shortness of breath. 8 g 2   amLODipine (NORVASC) 10 MG tablet Take 1 tablet (10 mg total) by mouth daily. 30 tablet 0   gabapentin (NEURONTIN) 300 MG capsule Take 1 capsule (300 mg total) by mouth 3 (three) times daily. 90 capsule 0   lisinopril (ZESTRIL) 5 MG tablet Take 1 tablet (5 mg total) by mouth daily. 30 tablet 0   methocarbamol (ROBAXIN) 750 MG tablet Take 1 tablet (750 mg total) by mouth 3 (three) times daily. (Patient taking differently: Take 750 mg by mouth 3 (three) times daily as needed for muscle spasms.) 20 tablet 0   [START ON 12/18/2022]  nicotine (NICODERM CQ - DOSED IN MG/24 HOURS) 14 mg/24hr patch Place 1 patch (14 mg total) onto the skin daily. 28 patch 0   traZODone (DESYREL) 50 MG tablet Take 1 tablet (50 mg total) by mouth at bedtime as needed for sleep. 30 tablet 0    Labs  Lab Results:  Admission on 12/14/2022, Discharged on 12/14/2022  Component Date Value Ref Range Status   Sodium 12/14/2022 133 (L)  135 - 145 mmol/L Final   Potassium 12/14/2022 4.0  3.5 - 5.1 mmol/L Final   Chloride 12/14/2022 99  98 - 111 mmol/L Final   CO2 12/14/2022 26  22 - 32 mmol/L Final   Glucose, Bld 12/14/2022 215 (H)  70 - 99 mg/dL Final   Glucose reference range applies only to samples taken after fasting for at least 8 hours.   BUN 12/14/2022 10  6 - 20 mg/dL Final    Creatinine, Ser 12/14/2022 0.85  0.61 - 1.24 mg/dL Final   Calcium 78/46/9629 8.5 (L)  8.9 - 10.3 mg/dL Final   Total Protein 52/84/1324 6.5  6.5 - 8.1 g/dL Final   Albumin 40/12/2723 3.3 (L)  3.5 - 5.0 g/dL Final   AST 36/64/4034 30  15 - 41 U/L Final   ALT 12/14/2022 44  0 - 44 U/L Final   Alkaline Phosphatase 12/14/2022 80  38 - 126 U/L Final   Total Bilirubin 12/14/2022 0.6  0.3 - 1.2 mg/dL Final   GFR, Estimated 12/14/2022 >60  >60 mL/min Final   Comment: (NOTE) Calculated using the CKD-EPI Creatinine Equation (2021)    Anion gap 12/14/2022 8  5 - 15 Final   Performed at [facility], [address] [place], [place] [address]   Sodium 12/14/2022 137  135 - 145 mmol/L Final   Potassium 12/14/2022 3.9  3.5 - 5.1 mmol/L Final   Chloride 12/14/2022 100  98 - 111 mmol/L Final   BUN 12/14/2022 11  6 - 20 mg/dL Final   Creatinine, Ser 12/14/2022 0.70  0.61 - 1.24 mg/dL Final   Glucose, Bld 56/38/7564 217 (H)  70 - 99 mg/dL Final   Glucose reference range applies only to samples taken after fasting for at least 8 hours.   Calcium, Ion 12/14/2022 1.21  1.15 - 1.40 mmol/L Final   TCO2 12/14/2022 24  22 - 32 mmol/L Final   Hemoglobin 12/14/2022 14.3  13.0 - 17.0 g/dL Final   HCT 33/29/5188 42.0  39.0 - 52.0 % Final   WBC 12/14/2022 7.9  4.0 - 10.5 K/uL Final   RBC 12/14/2022 4.67  4.22 - 5.81 MIL/uL Final   Hemoglobin 12/14/2022 13.3  13.0 - 17.0 g/dL Final   HCT 41/66/0630 41.2  39.0 - 52.0 % Final   MCV 12/14/2022 88.2  80.0 - 100.0 fL Final   MCH 12/14/2022 28.5  26.0 - 34.0 pg Final   MCHC 12/14/2022 32.3  30.0 - 36.0 g/dL Final   RDW 16/03/930 15.1  11.5 - 15.5 % Final   Platelets 12/14/2022 182  150 - 400 K/uL Final   nRBC 12/14/2022 0.0  0.0 - 0.2 % Final   Performed at [facility], [address], [place], [place] [address]   Alcohol, Ethyl (B) 12/14/2022 <10  <10 mg/dL Final   Comment: (NOTE) Lowest detectable limit for serum alcohol is 10 mg/dL.  For medical  purposes only. Performed at [facility], [address], [place], [place] [address]  Lactic Acid, Venous 12/14/2022 2.0 (HH)  0.5 - 1.9 mmol/L Final   Prothrombin Time 12/14/2022 12.7  11.4 - 15.2 seconds Final   INR 12/14/2022 0.9  0.8 - 1.2 Final   Comment: (NOTE) INR goal varies based on device and disease states. Performed at [facility], [address], [place], [place] [address]    Blood Bank Specimen 12/14/2022 SAMPLE AVAILABLE FOR TESTING   Final   Sample Expiration 12/14/2022    Final                   Value:12/17/2022,2359 Performed at [facility], [address], [place], [place] [address]   Admission on 12/12/2022, Discharged on 12/12/2022  Component Date Value Ref Range Status   WBC 12/12/2022 8.1  4.0 - 10.5 K/uL Final   RBC 12/12/2022 5.00  4.22 - 5.81 MIL/uL Final   Hemoglobin 12/12/2022 14.3  13.0 - 17.0 g/dL Final   HCT 27/08/2374 43.0  39.0 - 52.0 % Final   MCV 12/12/2022 86.0  80.0 - 100.0 fL Final   MCH 12/12/2022 28.6  26.0 - 34.0 pg Final   MCHC 12/12/2022 33.3  30.0 - 36.0 g/dL Final   RDW 28/31/5176 14.8  11.5 - 15.5 % Final   Platelets 12/12/2022 200  150 - 400 K/uL Final   nRBC 12/12/2022 0.0  0.0 - 0.2 % Final   Neutrophils Relative % 12/12/2022 64  % Final   Neutro Abs 12/12/2022 5.3  1.7 - 7.7 K/uL Final   Lymphocytes Relative 12/12/2022 27  % Final   Lymphs Abs 12/12/2022 2.2  0.7 - 4.0 K/uL Final   Monocytes Relative 12/12/2022 5  % Final   Monocytes Absolute 12/12/2022 0.4  0.1 - 1.0 K/uL Final   Eosinophils Relative 12/12/2022 2  % Final   Eosinophils Absolute 12/12/2022 0.1  0.0 - 0.5 K/uL Final   Basophils Relative 12/12/2022 1  % Final   Basophils Absolute 12/12/2022 0.0  0.0 - 0.1 K/uL Final   Immature Granulocytes 12/12/2022 1  % Final   Abs Immature Granulocytes 12/12/2022 0.04  0.00 - 0.07 K/uL Final   Performed at [facility], [address] [place], [place] [address]   Sodium 12/12/2022 136  135 - 145  mmol/L Final   Potassium 12/12/2022 4.0  3.5 - 5.1 mmol/L Final   Chloride 12/12/2022 94 (L)  98 - 111 mmol/L Final   CO2 12/12/2022 27  22 - 32 mmol/L Final   Glucose, Bld 12/12/2022 141 (H)  70 - 99 mg/dL Final   Glucose reference range applies only to samples taken after fasting for at least 8 hours.   BUN 12/12/2022 6  6 - 20 mg/dL Final   Creatinine, Ser 12/12/2022 0.73  0.61 - 1.24 mg/dL Final   Calcium 71/08/2692 9.2  8.9 - 10.3 mg/dL Final   Total Protein 85/46/2703 6.8  6.5 - 8.1 g/dL Final   Albumin 50/11/3816 3.6  3.5 - 5.0 g/dL Final   AST 29/93/7169 40  15 - 41 U/L Final   ALT 12/12/2022 59 (H)  0 - 44 U/L Final   Alkaline Phosphatase 12/12/2022 86  38 - 126 U/L Final   Total Bilirubin 12/12/2022 0.6  0.3 - 1.2 mg/dL Final   GFR, Estimated 12/12/2022 >60  >60 mL/min Final   Comment: (NOTE) Calculated using the CKD-EPI Creatinine Equation (2021)    Anion gap 12/12/2022 15  5 - 15 Final   Performed at  [facility], [address] [place] [place], [place] [address]   Hgb A1c MFr Bld 12/12/2022 7.7 (H)  4.8 - 5.6 % Final   Comment: (NOTE) Pre diabetes:          5.7%-6.4%  Diabetes:              >6.4%  Glycemic control for   <7.0% adults with diabetes    Mean Plasma Glucose 12/12/2022 174.29  mg/dL Final   Performed at [facility], [address], [place], [place] [address]   Magnesium 12/12/2022 1.7  1.7 - 2.4 mg/dL Final   Performed at [facility], [address], [place], [place] [address]   Alcohol, Ethyl (B) 12/12/2022 <10  <10 mg/dL Final   Comment: (NOTE) Lowest detectable limit for serum alcohol is 10 mg/dL.  For medical purposes only. Performed at [facility], [address], [place], [place] [address]    Cholesterol 12/12/2022 184  0 - 200 mg/dL Final   Triglycerides 29/56/2130 250 (H)  <150 mg/dL Final   HDL 86/57/8469 37 (L)  >40 mg/dL Final   Total CHOL/HDL Ratio 12/12/2022 5.0  RATIO Final   VLDL 12/12/2022 50 (H)  0 - 40 mg/dL  Final   LDL Cholesterol 12/12/2022 97  0 - 99 mg/dL Final   Comment:        Total Cholesterol/HDL:CHD Risk Coronary Heart Disease Risk Table                     Men   Women  1/2 Average Risk   3.4   3.3  Average Risk       5.0   4.4  2 X Average Risk   9.6   7.1  3 X Average Risk  23.4   11.0        Use the calculated Patient Ratio above and the CHD Risk Table to determine the patient's CHD Risk.        ATP III CLASSIFICATION (LDL):  <100     mg/dL   Optimal  629-528  mg/dL   Near or Above                    Optimal  130-159  mg/dL   Borderline  413-244  mg/dL   High  >010     mg/dL   Very High Performed at [facility], [address], [place], [place] [address]    TSH 12/12/2022 3.275  0.350 - 4.500 uIU/mL Final   Comment: Performed by a 3rd Generation assay with a functional sensitivity of <=0.01 uIU/mL. Performed at [facility], [address] [place], [place] [address]    Prolactin 12/12/2022 7.0  3.6 - 25.2 ng/mL Final   Comment: (NOTE) Performed At: [facility] [address] [place], [place] [id number] Jolene Schimke MD LO:[id number]    Color, Urine 12/12/2022 YELLOW  YELLOW Final   APPearance 12/12/2022 CLEAR  CLEAR Final   Specific Gravity, Urine 12/12/2022 1.015  1.005 - 1.030 Final   pH 12/12/2022 7.0  5.0 - 8.0 Final   Glucose, UA 12/12/2022 NEGATIVE  NEGATIVE mg/dL Final   Hgb urine dipstick 12/12/2022 NEGATIVE  NEGATIVE Final   Bilirubin Urine 12/12/2022 NEGATIVE  NEGATIVE Final   Ketones, ur 12/12/2022 NEGATIVE  NEGATIVE mg/dL Final   Protein, ur 88/41/6606 NEGATIVE  NEGATIVE mg/dL Final   Nitrite 30/16/0109 NEGATIVE  NEGATIVE Final   Leukocytes,Ua 12/12/2022  NEGATIVE  NEGATIVE Final   Performed at [facility], [address] [place], [place] [address]   POC Amphetamine UR 12/12/2022 None Detected  NONE DETECTED (Cut Off Level 1000 ng/mL) Final   POC Secobarbital (BAR) 12/12/2022 None Detected  NONE DETECTED (Cut Off Level  300 ng/mL) Final   POC Buprenorphine (BUP) 12/12/2022 None Detected  NONE DETECTED (Cut Off Level 10 ng/mL) Final   POC Oxazepam (BZO) 12/12/2022 None Detected  NONE DETECTED (Cut Off Level 300 ng/mL) Final   POC Cocaine UR 12/12/2022 None Detected  NONE DETECTED (Cut Off Level 300 ng/mL) Final   POC Methamphetamine UR 12/12/2022 None Detected  NONE DETECTED (Cut Off Level 1000 ng/mL) Final   POC Morphine 12/12/2022 None Detected  NONE DETECTED (Cut Off Level 300 ng/mL) Final   POC Methadone UR 12/12/2022 None Detected  NONE DETECTED (Cut Off Level 300 ng/mL) Final   POC Oxycodone UR 12/12/2022 None Detected  NONE DETECTED (Cut Off Level 100 ng/mL) Final   POC Marijuana UR 12/12/2022 None Detected  NONE DETECTED (Cut Off Level 50 ng/mL) Final  Admission on 08/27/2022, Discharged on 08/31/2022  Component Date Value Ref Range Status   WBC 08/27/2022 7.6  4.0 - 10.5 K/uL Final   RBC 08/27/2022 5.45  4.22 - 5.81 MIL/uL Final   Hemoglobin 08/27/2022 15.6  13.0 - 17.0 g/dL Final   HCT 60/45/4098 46.1  39.0 - 52.0 % Final   MCV 08/27/2022 84.6  80.0 - 100.0 fL Final   MCH 08/27/2022 28.6  26.0 - 34.0 pg Final   MCHC 08/27/2022 33.8  30.0 - 36.0 g/dL Final   RDW 11/91/4782 13.9  11.5 - 15.5 % Final   Platelets 08/27/2022 221  150 - 400 K/uL Final   nRBC 08/27/2022 0.0  0.0 - 0.2 % Final   Neutrophils Relative % 08/27/2022 60  % Final   Neutro Abs 08/27/2022 4.6  1.7 - 7.7 K/uL Final   Lymphocytes Relative 08/27/2022 32  % Final   Lymphs Abs 08/27/2022 2.5  0.7 - 4.0 K/uL Final   Monocytes Relative 08/27/2022 5  % Final   Monocytes Absolute 08/27/2022 0.4  0.1 - 1.0 K/uL Final   Eosinophils Relative 08/27/2022 2  % Final   Eosinophils Absolute 08/27/2022 0.1  0.0 - 0.5 K/uL Final   Basophils Relative 08/27/2022 1  % Final   Basophils Absolute 08/27/2022 0.1  0.0 - 0.1 K/uL Final   Immature Granulocytes 08/27/2022 0  % Final   Abs Immature Granulocytes 08/27/2022 0.02  0.00 - 0.07 K/uL Final    Performed at [facility], [address] [place], [place] [address]   Sodium 08/27/2022 133 (L)  135 - 145 mmol/L Final   Potassium 08/27/2022 3.9  3.5 - 5.1 mmol/L Final   Chloride 08/27/2022 100  98 - 111 mmol/L Final   CO2 08/27/2022 24  22 - 32 mmol/L Final   Glucose, Bld 08/27/2022 142 (H)  70 - 99 mg/dL Final   Glucose reference range applies only to samples taken after fasting for at least 8 hours.   BUN 08/27/2022 8  6 - 20 mg/dL Final   Creatinine, Ser 08/27/2022 0.80  0.61 - 1.24 mg/dL Final   Calcium 30/86/5784 9.0  8.9 - 10.3 mg/dL Final   Total Protein 69/62/9528 7.4  6.5 - 8.1 g/dL Final   Albumin 41/32/4401 3.9  3.5 - 5.0 g/dL Final   AST 02/72/5366 26  15 - 41 U/L Final  ALT 08/27/2022 28  0 - 44 U/L Final   Alkaline Phosphatase 08/27/2022 48  38 - 126 U/L Final   Total Bilirubin 08/27/2022 0.7  0.3 - 1.2 mg/dL Final   GFR, Estimated 08/27/2022 >60  >60 mL/min Final   Comment: (NOTE) Calculated using the CKD-EPI Creatinine Equation (2021)    Anion gap 08/27/2022 9  5 - 15 Final   Performed at [facility], [address], [place], [place] [address]   Specimen Source 08/27/2022 URINE, CLEAN CATCH   Final   Color, Urine 08/27/2022 YELLOW  YELLOW Final   APPearance 08/27/2022 CLEAR  CLEAR Final   Specific Gravity, Urine 08/27/2022 1.011  1.005 - 1.030 Final   pH 08/27/2022 5.0  5.0 - 8.0 Final   Glucose, UA 08/27/2022 NEGATIVE  NEGATIVE mg/dL Final   Hgb urine dipstick 08/27/2022 NEGATIVE  NEGATIVE Final   Bilirubin Urine 08/27/2022 NEGATIVE  NEGATIVE Final   Ketones, ur 08/27/2022 NEGATIVE  NEGATIVE mg/dL Final   Protein, ur 32/44/0102 NEGATIVE  NEGATIVE mg/dL Final   Nitrite 72/53/6644 NEGATIVE  NEGATIVE Final   Leukocytes,Ua 08/27/2022 NEGATIVE  NEGATIVE Final   RBC / HPF 08/27/2022 0-5  0 - 5 RBC/hpf Final   WBC, UA 08/27/2022 0-5  0 - 5 WBC/hpf Final   Comment:        Reflex urine culture not performed if WBC <=10, OR if Squamous  epithelial cells >5. If Squamous epithelial cells >5 suggest recollection.    Bacteria, UA 08/27/2022 NONE SEEN  NONE SEEN Final   Squamous Epithelial / HPF 08/27/2022 0-5  0 - 5 /HPF Final   Mucus 08/27/2022 PRESENT   Final   Performed at [facility], [address], [place], [place] [address]   Opiates 08/27/2022 POSITIVE (A)  NONE DETECTED Final   Cocaine 08/27/2022 NONE DETECTED  NONE DETECTED Final   Benzodiazepines 08/27/2022 NONE DETECTED  NONE DETECTED Final   Amphetamines 08/27/2022 NONE DETECTED  NONE DETECTED Final   Tetrahydrocannabinol 08/27/2022 NONE DETECTED  NONE DETECTED Final   Barbiturates 08/27/2022 NONE DETECTED  NONE DETECTED Final   Comment: (NOTE) DRUG SCREEN FOR MEDICAL PURPOSES ONLY.  IF CONFIRMATION IS NEEDED FOR ANY PURPOSE, NOTIFY LAB WITHIN 5 DAYS.  LOWEST DETECTABLE LIMITS FOR URINE DRUG SCREEN Drug Class                     Cutoff (ng/mL) Amphetamine and metabolites    1000 Barbiturate and metabolites    200 Benzodiazepine                 200 Opiates and metabolites        300 Cocaine and metabolites        300 THC                            50 Performed at [facility], [address], [place], [place] [address]    Magnesium 08/27/2022 1.9  1.7 - 2.4 mg/dL Final   Performed at [facility], [address] [place], [place] [address]   WBC 08/28/2022 9.0  4.0 - 10.5 K/uL Final   RBC 08/28/2022 5.27  4.22 - 5.81 MIL/uL Final   Hemoglobin 08/28/2022 14.6  13.0 - 17.0 g/dL Final   HCT 43/32/9518 44.5  39.0 - 52.0 % Final   MCV 08/28/2022 84.4  80.0 - 100.0 fL Final   MCH 08/28/2022 27.7  26.0 - 34.0 pg Final   MCHC 08/28/2022 32.8  30.0 - 36.0 g/dL Final   RDW 60/45/4098 14.0  11.5 - 15.5 % Final   Platelets 08/28/2022 242  150 - 400 K/uL Final   nRBC 08/28/2022 0.0  0.0 - 0.2 % Final   Neutrophils Relative % 08/28/2022 66  % Final   Neutro Abs 08/28/2022 6.0  1.7 - 7.7 K/uL Final   Lymphocytes Relative 08/28/2022 26  %  Final   Lymphs Abs 08/28/2022 2.3  0.7 - 4.0 K/uL Final   Monocytes Relative 08/28/2022 6  % Final   Monocytes Absolute 08/28/2022 0.5  0.1 - 1.0 K/uL Final   Eosinophils Relative 08/28/2022 1  % Final   Eosinophils Absolute 08/28/2022 0.1  0.0 - 0.5 K/uL Final   Basophils Relative 08/28/2022 1  % Final   Basophils Absolute 08/28/2022 0.1  0.0 - 0.1 K/uL Final   Immature Granulocytes 08/28/2022 0  % Final   Abs Immature Granulocytes 08/28/2022 0.03  0.00 - 0.07 K/uL Final   Performed at [facility], [address] [place], [place] [address]   Sodium 08/28/2022 133 (L)  135 - 145 mmol/L Final   Potassium 08/28/2022 3.8  3.5 - 5.1 mmol/L Final   Chloride 08/28/2022 99  98 - 111 mmol/L Final   CO2 08/28/2022 26  22 - 32 mmol/L Final   Glucose, Bld 08/28/2022 107 (H)  70 - 99 mg/dL Final   Glucose reference range applies only to samples taken after fasting for at least 8 hours.   BUN 08/28/2022 14  6 - 20 mg/dL Final   Creatinine, Ser 08/28/2022 0.85  0.61 - 1.24 mg/dL Final   Calcium 78/29/5621 9.0  8.9 - 10.3 mg/dL Final   Total Protein 30/86/5784 6.7  6.5 - 8.1 g/dL Final   Albumin 69/62/9528 3.8  3.5 - 5.0 g/dL Final   AST 41/32/4401 31  15 - 41 U/L Final   ALT 08/28/2022 34  0 - 44 U/L Final   Alkaline Phosphatase 08/28/2022 52  38 - 126 U/L Final   Total Bilirubin 08/28/2022 0.6  0.3 - 1.2 mg/dL Final   GFR, Estimated 08/28/2022 >60  >60 mL/min Final   Comment: (NOTE) Calculated using the CKD-EPI Creatinine Equation (2021)    Anion gap 08/28/2022 8  5 - 15 Final   Performed at [facility], [address], [place], [place] [address]   Magnesium 08/28/2022 2.0  1.7 - 2.4 mg/dL Final   Performed at [facility], [address], [place], [place] [address]   Phosphorus 08/28/2022 4.3  2.5 - 4.6 mg/dL Final   Performed at [facility], [address] [place], [place] [address]   WBC 08/29/2022 7.5  4.0 - 10.5 K/uL Final   RBC 08/29/2022 4.71  4.22 - 5.81  MIL/uL Final   Hemoglobin 08/29/2022 13.8  13.0 - 17.0 g/dL Final   HCT 25/95/6387 40.5  39.0 - 52.0 % Final   MCV 08/29/2022 86.0  80.0 - 100.0 fL Final   MCH 08/29/2022 29.3  26.0 - 34.0 pg Final   MCHC 08/29/2022 34.1  30.0 - 36.0 g/dL Final   RDW 56/43/3295 13.8  11.5 - 15.5 % Final   Platelets 08/29/2022 212  150 - 400 K/uL Final   nRBC 08/29/2022 0.0  0.0 - 0.2 % Final   Neutrophils Relative % 08/29/2022 48  % Final   Neutro Abs 08/29/2022 3.7  1.7 -  7.7 K/uL Final   Lymphocytes Relative 08/29/2022 40  % Final   Lymphs Abs 08/29/2022 3.0  0.7 - 4.0 K/uL Final   Monocytes Relative 08/29/2022 7  % Final   Monocytes Absolute 08/29/2022 0.5  0.1 - 1.0 K/uL Final   Eosinophils Relative 08/29/2022 4  % Final   Eosinophils Absolute 08/29/2022 0.3  0.0 - 0.5 K/uL Final   Basophils Relative 08/29/2022 1  % Final   Basophils Absolute 08/29/2022 0.1  0.0 - 0.1 K/uL Final   Immature Granulocytes 08/29/2022 0  % Final   Abs Immature Granulocytes 08/29/2022 0.02  0.00 - 0.07 K/uL Final   Performed at [facility], [address], [place], [place] [address]   Sodium 08/29/2022 135  135 - 145 mmol/L Final   Potassium 08/29/2022 4.0  3.5 - 5.1 mmol/L Final   Chloride 08/29/2022 99  98 - 111 mmol/L Final   CO2 08/29/2022 27  22 - 32 mmol/L Final   Glucose, Bld 08/29/2022 179 (H)  70 - 99 mg/dL Final   Glucose reference range applies only to samples taken after fasting for at least 8 hours.   BUN 08/29/2022 13  6 - 20 mg/dL Final   Creatinine, Ser 08/29/2022 0.99  0.61 - 1.24 mg/dL Final   Calcium 60/45/4098 9.1  8.9 - 10.3 mg/dL Final   Total Protein 11/91/4782 6.6  6.5 - 8.1 g/dL Final   Albumin 95/62/1308 3.6  3.5 - 5.0 g/dL Final   AST 65/78/4696 29  15 - 41 U/L Final   ALT 08/29/2022 34  0 - 44 U/L Final   Alkaline Phosphatase 08/29/2022 51  38 - 126 U/L Final   Total Bilirubin 08/29/2022 0.6  0.3 - 1.2 mg/dL Final   GFR, Estimated 08/29/2022 >60  >60 mL/min Final   Comment:  (NOTE) Calculated using the CKD-EPI Creatinine Equation (2021)    Anion gap 08/29/2022 9  5 - 15 Final   Performed at [facility], [address], [place], [place] [address]   Phosphorus 08/29/2022 4.3  2.5 - 4.6 mg/dL Final   Performed at [facility], [address] [place], [place] [address]   Magnesium 08/29/2022 1.9  1.7 - 2.4 mg/dL Final   Performed at [facility], [address] [place], [place] [address]    Blood Alcohol level:  Lab Results  Component Value Date   [facility] <10 12/14/2022   ETH <10 12/12/2022    Metabolic Disorder Labs: Lab Results  Component Value Date   HGBA1C 7.7 (H) 12/12/2022   MPG 174.29 12/12/2022   MPG 136.98 02/10/2020   Lab Results  Component Value Date   PROLACTIN 7.0 12/12/2022   Lab Results  Component Value Date   CHOL 184 12/12/2022   TRIG 250 (H) 12/12/2022   HDL 37 (L) 12/12/2022   CHOLHDL 5.0 12/12/2022   VLDL 50 (H) 12/12/2022   LDLCALC 97 12/12/2022    Therapeutic Lab Levels: No results found for: "LITHIUM" No results found for: "VALPROATE" No results found for: "CBMZ"  Physical Findings   PHQ2-9    Flowsheet Row ED from 12/14/2022 in [facility] ED from 12/12/2022 in [place]  PHQ-2 Total Score 0 0      Flowsheet Row ED from 12/14/2022 in [facility] Most recent reading at 12/14/2022  9:31 AM ED from 12/14/2022 in [facility] Emergency Department at [facility] Most  recent reading at 12/14/2022 12:40 AM ED from 12/12/2022 in [facility] Most recent reading at 12/12/2022  5:26 PM  C-SSRS RISK CATEGORY No Risk No Risk No Risk        Musculoskeletal  Strength & Muscle Tone: within normal limits Gait & Station: normal Patient leans: N/A  Psychiatric Specialty Exam  Presentation  General Appearance:  Appropriate for Environment; Casual  Eye Contact: Good  Speech: Clear  and Coherent; Normal Rate  Speech Volume: Normal  Handedness: Right   Mood and Affect  Mood: Anxious  Affect: Congruent; Appropriate   Thought Process  Thought Processes: Coherent; Goal Directed; Linear  Descriptions of Associations:Intact  Orientation:Full (Time, Place and Person)  Thought Content:Logical; WDL  Diagnosis of Schizophrenia or Schizoaffective disorder in past: No    Hallucinations:Hallucinations: None  Ideas of Reference:None  Suicidal Thoughts:Suicidal Thoughts: No  Homicidal Thoughts:Homicidal Thoughts: No   Sensorium  Memory: Remote Good  Judgment: Fair  Insight: Fair   Art therapist  Concentration: Good  Attention Span: Good  Recall: Good  Fund of Knowledge: Good  Language: Good   Psychomotor Activity  Psychomotor Activity: Psychomotor Activity: Normal   Assets  Assets: Communication Skills; Desire for Improvement; Social Support; Housing   Sleep  Sleep: Sleep: Fair   No data recorded   Physical Exam  Physical Exam Vitals reviewed.  Constitutional:      Appearance: Normal appearance.  HENT:     Head: Normocephalic and atraumatic.  Cardiovascular:     Rate and Rhythm: Normal rate.  Pulmonary:     Effort: Pulmonary effort is normal.  Neurological:     Mental Status: He is alert.    Review of Systems  Constitutional:  Negative for diaphoresis and fever.  Gastrointestinal:  Negative for nausea and vomiting.  Musculoskeletal:  Positive for myalgias.  Neurological:  Negative for tremors.   Blood pressure 118/70, pulse 83, temperature 97.7 F (36.5 C), temperature source Oral, resp. rate 18, SpO2 95%. There is no height or weight on file to calculate BMI.  Treatment Plan Summary:   Alcohol Use Disorder Alcohol Withdrawal Unfortunately, patient received opioids when he went to the ED so we will NOT continue Naltrexone. Otherwise will continue: -Thiamine 100 mg PO -Multivitamin with  minerals daily -Tylenol 650 mg every 6 hours as needed for pain -Zofran 4 mg every 6 hours as needed for nausea or vomiting -Imodium 2 to 4 mg as needed for diarrhea or loose stools - Continue Gabapentin 300 mg TID - Continue Trazodone 50 mg at bedtime PRN insomnia    Tobacco Use Disorder -Nicotine patch 14 mg   Continue home meds: albuterol, amlodipine, neurontin, lisinopril   Dispo: [facility] or [facility], will likely discharge tomorrow  Lance Muss, MD 12/17/2022 1:20 PM

## 2022-12-17 NOTE — ED Notes (Signed)
Patient is in the bedroom sleeping.NAD.  Respirations are even and unlabored.  Will continue to monitor for safety.

## 2022-12-17 NOTE — ED Notes (Signed)
Pt sitting in dayroom interacting with peers and watching tv. No acute distress noted. Pleasant and cooperative with staff. No concerns voiced. Informed pt to notify staff with any needs or assistance. Pt verbalized understanding or agreement. Will continue to monitor for safety.

## 2022-12-17 NOTE — ED Notes (Signed)
Pt is in the Dayroom with other patients watching TV and interacting with other. Concern of getting a residential treatment to be d/c to latest by tomorrow.  Other than that NAD, but a little anxious. Respirations are even and unlabored. Will continue to monitor for safety

## 2022-12-18 DIAGNOSIS — F1090 Alcohol use, unspecified, uncomplicated: Secondary | ICD-10-CM | POA: Diagnosis not present

## 2022-12-18 MED ORDER — HYDROXYZINE HCL 25 MG PO TABS
25.0000 mg | ORAL_TABLET | Freq: Three times a day (TID) | ORAL | Status: DC | PRN
  Administered 2022-12-18: 25 mg via ORAL
  Filled 2022-12-18 (×2): qty 1

## 2022-12-18 MED ORDER — HYDROXYZINE HCL 25 MG PO TABS: 25.0000 mg | ORAL_TABLET | Freq: Three times a day (TID) | ORAL | 0 refills | Status: AC | PRN

## 2022-12-18 MED ORDER — GABAPENTIN 300 MG PO CAPS: 300.0000 mg | ORAL_CAPSULE | Freq: Three times a day (TID) | ORAL | 0 refills | Status: AC

## 2022-12-18 MED ORDER — AMLODIPINE BESYLATE 10 MG PO TABS: 10.0000 mg | ORAL_TABLET | Freq: Every day | ORAL | 0 refills | Status: DC

## 2022-12-18 MED ORDER — NICOTINE 14 MG/24HR TD PT24: 14.0000 mg | MEDICATED_PATCH | Freq: Every day | TRANSDERMAL | 0 refills | Status: DC

## 2022-12-18 MED ORDER — TRAZODONE HCL 50 MG PO TABS: 50.0000 mg | ORAL_TABLET | Freq: Every evening | ORAL | 0 refills | Status: DC | PRN

## 2022-12-18 MED ORDER — LISINOPRIL 5 MG PO TABS: 5.0000 mg | ORAL_TABLET | Freq: Every day | ORAL | 0 refills | Status: DC

## 2022-12-18 NOTE — ED Notes (Signed)
Patient is A&O x 4. Pleasant and cooperative. Denies SI, HI, AVH. Patient does not appear to be responding to internal stimuli. Patient reports feeling a good anxiousness r/t acceptance into a residential program today. Patient also reports a bit of apprehension r/t the unknown. Currently communicating with the Sw and Clinical research associate.

## 2022-12-18 NOTE — ED Provider Notes (Signed)
  Notified by nursing that patient been accepted to [facility] and will need to be discharged in a timely manner for patient to present to facility by 6 PM today.  Patient has been seen by [facility] provider today.  Met with patient briefly.  He is pleasant and is looking forward to this neck step in his sobriety.  He was pleased with his phone interview with [facility].  He admits to being a little nervous about this next step.  However he does appear motivated.  He is denying any suicidal or homicidal thoughts.  He is denying any auditory/visual hallucinations.  He appears logical and answers questions appropriately.   Provided printed prescriptions for a 1 month supply as requested by [facility] of the following medications:  Amlodipine 10 mg daily Gabapentin 300 mg 3 times daily Hydroxyzine 25 mg 3 times daily as needed for anxiety Lisinopril 5 mg daily Nicotine 14 mg 24-hour patch Trazodone 50 mg nightly as needed for sleep.   Patient will be transported via safe transport to [facility].

## 2022-12-18 NOTE — ED Notes (Signed)
Patient is in the bedroom sleeping.NAD.  Respirations are even and unlabored.  Will continue to monitor for safety.

## 2022-12-18 NOTE — Discharge Planning (Signed)
LCSW followed up with Admissions Coordinator Dow Adolph at [facility] who reports patient would be a good candidate for their treatment facility. Per Dow Adolph, patient can admit to their facility on today by 6:00pm with a 30 day script. Transportation will be arranged via Psychologist, educational for patient to arrive on time. LCSW spoke with patient to confirm if he is in agreement with plan. Patient expressed appreciation for LCSW assistance. No other needs were reported by patient or facility. MD made aware and will prepare for discharge.   Fernande Boyden, LCSW Clinical Social Worker [facility] Ph: [phone]

## 2022-12-18 NOTE — ED Notes (Signed)
Patient d/c to [facility] in stable condition with all belongings/ scripts. AVS reviewed with patient expressing understanding. Patient denies SI, HI, AVH. Patient does not appear to be responding to internal stimuli. Patient expressed thankfulness for facility staffs support and concern.

## 2022-12-18 NOTE — ED Notes (Signed)
Patient participating in groups in dayroom from 1435 to present.

## 2022-12-18 NOTE — Group Note (Signed)
Group Topic: Relapse and Recovery  Group Date: 12/18/2022 Start Time: 1415 End Time: 1435 Facilitators: Mayer Camel L, RN  Department: [facility]  Number of Participants: 5  Group Focus: nursing group Treatment Modality:  Psychoeducation Interventions utilized were patient education Purpose: express feelings, increase insight, and reinforce self-care  Name: Kristopher [name] Date of Birth: 01-31-1971  MR: [id number]    Level of Participation: active Quality of Participation: attentive, cooperative, engaged, motivated, and offered feedback Interactions with others: gave feedback Mood/Affect: appropriate and positive Triggers (if applicable): n/a Cognition: coherent/clear, goal directed, insightful, and logical Progress: Gaining insight Response: Patient states he has a positive outlook on regaining control of his lif. He is a bit apprehensive about going into a residential space but acknowledges it is what he requires to be mentally and physically refocused. Patient also identified some goals he would like to achieve in th near future.  Plan: follow-up needed  Patients Problems:  Patient Active Problem List   Diagnosis Date Noted   Alcohol use disorder, severe, dependence (HCC) 12/12/2022   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

## 2022-12-18 NOTE — ED Provider Notes (Signed)
Behavioral Health Progress Note  Date and Time: 12/18/2022 8:58 AM Name: Kristopher [name] MRN:  [id number]  Subjective:  Kristopher [name] is a 52 y.o. male patient presented to [facility] with complaints of alcohol abuse and requesting assistance with detox.   Patient seen this AM. He feels anxious regarding discharge planning and has been proactively working to figure out a plan. He reports poor sleep and fair appetite. He denies SI, HI, and AVH.   This patient has been off opioids for the last 24 hours and he is willing to complete inpatient treatment. He is stable for discharge, awaiting placement.  Diagnosis:  Final diagnoses:  Alcohol use disorder    Total Time spent with patient: 30 minutes  Past Psychiatric History: AUD   Past Medical History: HTN, COPD Family History: DM Social History: lives with cousin and cousin's husband in [place], previously works as a Naval architect but recently unemployed, support system is his cousin   Current Medications:  Current Facility-Administered Medications  Medication Dose Route Frequency Provider Last Rate Last Admin   acetaminophen (TYLENOL) tablet 650 mg  650 mg Oral Q6H PRN Kizzie Ide B, MD   650 mg at 12/17/22 2205   albuterol (VENTOLIN HFA) 108 (90 Base) MCG/ACT inhaler 1-2 puff  1-2 puff Inhalation Q6H PRN Kizzie Ide B, MD       amLODipine (NORVASC) tablet 10 mg  10 mg Oral Daily Kizzie Ide B, MD   10 mg at 12/17/22 0910   gabapentin (NEURONTIN) capsule 300 mg  300 mg Oral TID Kizzie Ide B, MD   300 mg at 12/17/22 2204   hydrOXYzine (ATARAX) tablet 25 mg  25 mg Oral TID PRN Kizzie Ide B, MD       lisinopril (ZESTRIL) tablet 5 mg  5 mg Oral Daily Kizzie Ide B, MD   5 mg at 12/17/22 0910   multivitamin with minerals tablet 1 tablet  1 tablet Oral Daily Kizzie Ide B, MD   1 tablet at 12/17/22 0910   nicotine (NICODERM CQ - dosed in mg/24 hours) patch 14 mg  14 mg Transdermal Daily Kizzie Ide B, MD   14 mg at 12/17/22  [id number]   thiamine (VITAMIN B1) tablet 100 mg  100 mg Oral Daily Kizzie Ide B, MD   100 mg at 12/17/22 0910   traZODone (DESYREL) tablet 50 mg  50 mg Oral QHS PRN Kizzie Ide B, MD   50 mg at 12/17/22 2204   Current Outpatient Medications  Medication Sig Dispense Refill   albuterol (VENTOLIN HFA) 108 (90 Base) MCG/ACT inhaler Inhale 1-2 puffs into the lungs every 6 (six) hours as needed for wheezing or shortness of breath. 8 g 2   amLODipine (NORVASC) 10 MG tablet Take 1 tablet (10 mg total) by mouth daily. 30 tablet 0   gabapentin (NEURONTIN) 300 MG capsule Take 1 capsule (300 mg total) by mouth 3 (three) times daily. 90 capsule 0   lisinopril (ZESTRIL) 5 MG tablet Take 1 tablet (5 mg total) by mouth daily. 30 tablet 0   methocarbamol (ROBAXIN) 750 MG tablet Take 1 tablet (750 mg total) by mouth 3 (three) times daily. (Patient taking differently: Take 750 mg by mouth 3 (three) times daily as needed for muscle spasms.) 20 tablet 0   nicotine (NICODERM CQ - DOSED IN MG/24 HOURS) 14 mg/24hr patch Place 1 patch (14 mg total) onto the skin daily. 28 patch 0   traZODone (DESYREL) 50 MG tablet Take 1 tablet (  50 mg total) by mouth at bedtime as needed for sleep. 30 tablet 0    Labs  Lab Results:  Admission on 12/14/2022, Discharged on 12/14/2022  Component Date Value Ref Range Status   Sodium 12/14/2022 133 (L)  135 - 145 mmol/L Final   Potassium 12/14/2022 4.0  3.5 - 5.1 mmol/L Final   Chloride 12/14/2022 99  98 - 111 mmol/L Final   CO2 12/14/2022 26  22 - 32 mmol/L Final   Glucose, Bld 12/14/2022 215 (H)  70 - 99 mg/dL Final   Glucose reference range applies only to samples taken after fasting for at least 8 hours.   BUN 12/14/2022 10  6 - 20 mg/dL Final   Creatinine, Ser 12/14/2022 0.85  0.61 - 1.24 mg/dL Final   Calcium 81/19/1478 8.5 (L)  8.9 - 10.3 mg/dL Final   Total Protein 29/56/2130 6.5  6.5 - 8.1 g/dL Final   Albumin 86/57/8469 3.3 (L)  3.5 - 5.0 g/dL Final   AST 62/95/2841 30   15 - 41 U/L Final   ALT 12/14/2022 44  0 - 44 U/L Final   Alkaline Phosphatase 12/14/2022 80  38 - 126 U/L Final   Total Bilirubin 12/14/2022 0.6  0.3 - 1.2 mg/dL Final   GFR, Estimated 12/14/2022 >60  >60 mL/min Final   Comment: (NOTE) Calculated using the CKD-EPI Creatinine Equation (2021)    Anion gap 12/14/2022 8  5 - 15 Final   Performed at [facility], [address] [place], [place] [address]   Sodium 12/14/2022 137  135 - 145 mmol/L Final   Potassium 12/14/2022 3.9  3.5 - 5.1 mmol/L Final   Chloride 12/14/2022 100  98 - 111 mmol/L Final   BUN 12/14/2022 11  6 - 20 mg/dL Final   Creatinine, Ser 12/14/2022 0.70  0.61 - 1.24 mg/dL Final   Glucose, Bld 01/20/2535 217 (H)  70 - 99 mg/dL Final   Glucose reference range applies only to samples taken after fasting for at least 8 hours.   Calcium, Ion 12/14/2022 1.21  1.15 - 1.40 mmol/L Final   TCO2 12/14/2022 24  22 - 32 mmol/L Final   Hemoglobin 12/14/2022 14.3  13.0 - 17.0 g/dL Final   HCT 64/40/3474 42.0  39.0 - 52.0 % Final   WBC 12/14/2022 7.9  4.0 - 10.5 K/uL Final   RBC 12/14/2022 4.67  4.22 - 5.81 MIL/uL Final   Hemoglobin 12/14/2022 13.3  13.0 - 17.0 g/dL Final   HCT 25/95/6387 41.2  39.0 - 52.0 % Final   MCV 12/14/2022 88.2  80.0 - 100.0 fL Final   MCH 12/14/2022 28.5  26.0 - 34.0 pg Final   MCHC 12/14/2022 32.3  30.0 - 36.0 g/dL Final   RDW 56/43/3295 15.1  11.5 - 15.5 % Final   Platelets 12/14/2022 182  150 - 400 K/uL Final   nRBC 12/14/2022 0.0  0.0 - 0.2 % Final   Performed at [facility], [address] [place], [place] [address]   Alcohol, Ethyl (B) 12/14/2022 <10  <10 mg/dL Final   Comment: (NOTE) Lowest detectable limit for serum alcohol is 10 mg/dL.  For medical purposes only. Performed at [facility], [address], [place], [place] [address]    Lactic Acid, Venous 12/14/2022 2.0 (HH)  0.5 - 1.9 mmol/L Final   Prothrombin Time 12/14/2022 12.7  11.4 - 15.2 seconds Final   INR  12/14/2022 0.9  0.8 - 1.2 Final  Comment: (NOTE) INR goal varies based on device and disease states. Performed at [facility] Lab, [address], [place], [place] [address]    Blood Bank Specimen 12/14/2022 SAMPLE AVAILABLE FOR TESTING   Final   Sample Expiration 12/14/2022    Final                   Value:12/17/2022,2359 Performed at [facility] Lab, [address], [place], [place] [address]   Admission on 12/12/2022, Discharged on 12/12/2022  Component Date Value Ref Range Status   WBC 12/12/2022 8.1  4.0 - 10.5 K/uL Final   RBC 12/12/2022 5.00  4.22 - 5.81 MIL/uL Final   Hemoglobin 12/12/2022 14.3  13.0 - 17.0 g/dL Final   HCT 09/81/1914 43.0  39.0 - 52.0 % Final   MCV 12/12/2022 86.0  80.0 - 100.0 fL Final   MCH 12/12/2022 28.6  26.0 - 34.0 pg Final   MCHC 12/12/2022 33.3  30.0 - 36.0 g/dL Final   RDW 78/29/5621 14.8  11.5 - 15.5 % Final   Platelets 12/12/2022 200  150 - 400 K/uL Final   nRBC 12/12/2022 0.0  0.0 - 0.2 % Final   Neutrophils Relative % 12/12/2022 64  % Final   Neutro Abs 12/12/2022 5.3  1.7 - 7.7 K/uL Final   Lymphocytes Relative 12/12/2022 27  % Final   Lymphs Abs 12/12/2022 2.2  0.7 - 4.0 K/uL Final   Monocytes Relative 12/12/2022 5  % Final   Monocytes Absolute 12/12/2022 0.4  0.1 - 1.0 K/uL Final   Eosinophils Relative 12/12/2022 2  % Final   Eosinophils Absolute 12/12/2022 0.1  0.0 - 0.5 K/uL Final   Basophils Relative 12/12/2022 1  % Final   Basophils Absolute 12/12/2022 0.0  0.0 - 0.1 K/uL Final   Immature Granulocytes 12/12/2022 1  % Final   Abs Immature Granulocytes 12/12/2022 0.04  0.00 - 0.07 K/uL Final   Performed at [facility], [address] [place], [place] [address]   Sodium 12/12/2022 136  135 - 145 mmol/L Final   Potassium 12/12/2022 4.0  3.5 - 5.1 mmol/L Final   Chloride 12/12/2022 94 (L)  98 - 111 mmol/L Final   CO2 12/12/2022 27  22 - 32 mmol/L Final   Glucose, Bld 12/12/2022 141 (H)  70 - 99 mg/dL Final   Glucose  reference range applies only to samples taken after fasting for at least 8 hours.   BUN 12/12/2022 6  6 - 20 mg/dL Final   Creatinine, Ser 12/12/2022 0.73  0.61 - 1.24 mg/dL Final   Calcium 78/46/9629 9.2  8.9 - 10.3 mg/dL Final   Total Protein 52/84/1324 6.8  6.5 - 8.1 g/dL Final   Albumin 40/12/2723 3.6  3.5 - 5.0 g/dL Final   AST 36/64/4034 40  15 - 41 U/L Final   ALT 12/12/2022 59 (H)  0 - 44 U/L Final   Alkaline Phosphatase 12/12/2022 86  38 - 126 U/L Final   Total Bilirubin 12/12/2022 0.6  0.3 - 1.2 mg/dL Final   GFR, Estimated 12/12/2022 >60  >60 mL/min Final   Comment: (NOTE) Calculated using the CKD-EPI Creatinine Equation (2021)    Anion gap 12/12/2022 15  5 - 15 Final   Performed at [facility] Lab, [address], [place], [place] [address]   Hgb A1c MFr Bld 12/12/2022 7.7 (H)  4.8 - 5.6 % Final   Comment: (NOTE) Pre diabetes:  5.7%-6.4%  Diabetes:              >6.4%  Glycemic control for   <7.0% adults with diabetes    Mean Plasma Glucose 12/12/2022 174.29  mg/dL Final   Performed at [facility], [address] [place], [place] [address]   Magnesium 12/12/2022 1.7  1.7 - 2.4 mg/dL Final   Performed at [facility], [address] [place], [place] [address]   Alcohol, Ethyl (B) 12/12/2022 <10  <10 mg/dL Final   Comment: (NOTE) Lowest detectable limit for serum alcohol is 10 mg/dL.  For medical purposes only. Performed at [facility], [address], [place], [place] [address]    Cholesterol 12/12/2022 184  0 - 200 mg/dL Final   Triglycerides 02/54/2706 250 (H)  <150 mg/dL Final   HDL 23/76/2831 37 (L)  >40 mg/dL Final   Total CHOL/HDL Ratio 12/12/2022 5.0  RATIO Final   VLDL 12/12/2022 50 (H)  0 - 40 mg/dL Final   LDL Cholesterol 12/12/2022 97  0 - 99 mg/dL Final   Comment:        Total Cholesterol/HDL:CHD Risk Coronary Heart Disease Risk Table                     Men   Women  1/2 Average Risk   3.4   3.3  Average Risk        5.0   4.4  2 X Average Risk   9.6   7.1  3 X Average Risk  23.4   11.0        Use the calculated Patient Ratio above and the CHD Risk Table to determine the patient's CHD Risk.        ATP III CLASSIFICATION (LDL):  <100     mg/dL   Optimal  517-616  mg/dL   Near or Above                    Optimal  130-159  mg/dL   Borderline  073-710  mg/dL   High  >626     mg/dL   Very High Performed at [facility], [address], [place], [place] [address]    TSH 12/12/2022 3.275  0.350 - 4.500 uIU/mL Final   Comment: Performed by a 3rd Generation assay with a functional sensitivity of <=0.01 uIU/mL. Performed at [facility], [address], [place], [place] [address]    Prolactin 12/12/2022 7.0  3.6 - 25.2 ng/mL Final   Comment: (NOTE) Performed At: [facility] [address] [place], [place] [id number] Jolene Schimke MD XH:[id number]    Color, Urine 12/12/2022 YELLOW  YELLOW Final   APPearance 12/12/2022 CLEAR  CLEAR Final   Specific Gravity, Urine 12/12/2022 1.015  1.005 - 1.030 Final   pH 12/12/2022 7.0  5.0 - 8.0 Final   Glucose, UA 12/12/2022 NEGATIVE  NEGATIVE mg/dL Final   Hgb urine dipstick 12/12/2022 NEGATIVE  NEGATIVE Final   Bilirubin Urine 12/12/2022 NEGATIVE  NEGATIVE Final   Ketones, ur 12/12/2022 NEGATIVE  NEGATIVE mg/dL Final   Protein, ur 81/03/7508 NEGATIVE  NEGATIVE mg/dL Final   Nitrite 25/85/2778 NEGATIVE  NEGATIVE Final   Leukocytes,Ua 12/12/2022 NEGATIVE  NEGATIVE Final   Performed at [facility], [address], [place], [place] [address]   POC Amphetamine UR 12/12/2022 None Detected  NONE DETECTED (Cut Off Level 1000 ng/mL) Final   POC Secobarbital (BAR)  12/12/2022 None Detected  NONE DETECTED (Cut Off Level 300 ng/mL) Final   POC Buprenorphine (BUP) 12/12/2022 None Detected  NONE DETECTED (Cut Off Level 10 ng/mL) Final   POC Oxazepam (BZO) 12/12/2022 None Detected  NONE DETECTED (Cut Off Level 300 ng/mL) Final   POC Cocaine UR  12/12/2022 None Detected  NONE DETECTED (Cut Off Level 300 ng/mL) Final   POC Methamphetamine UR 12/12/2022 None Detected  NONE DETECTED (Cut Off Level 1000 ng/mL) Final   POC Morphine 12/12/2022 None Detected  NONE DETECTED (Cut Off Level 300 ng/mL) Final   POC Methadone UR 12/12/2022 None Detected  NONE DETECTED (Cut Off Level 300 ng/mL) Final   POC Oxycodone UR 12/12/2022 None Detected  NONE DETECTED (Cut Off Level 100 ng/mL) Final   POC Marijuana UR 12/12/2022 None Detected  NONE DETECTED (Cut Off Level 50 ng/mL) Final  Admission on 08/27/2022, Discharged on 08/31/2022  Component Date Value Ref Range Status   WBC 08/27/2022 7.6  4.0 - 10.5 K/uL Final   RBC 08/27/2022 5.45  4.22 - 5.81 MIL/uL Final   Hemoglobin 08/27/2022 15.6  13.0 - 17.0 g/dL Final   HCT 35/57/3220 46.1  39.0 - 52.0 % Final   MCV 08/27/2022 84.6  80.0 - 100.0 fL Final   MCH 08/27/2022 28.6  26.0 - 34.0 pg Final   MCHC 08/27/2022 33.8  30.0 - 36.0 g/dL Final   RDW 25/42/7062 13.9  11.5 - 15.5 % Final   Platelets 08/27/2022 221  150 - 400 K/uL Final   nRBC 08/27/2022 0.0  0.0 - 0.2 % Final   Neutrophils Relative % 08/27/2022 60  % Final   Neutro Abs 08/27/2022 4.6  1.7 - 7.7 K/uL Final   Lymphocytes Relative 08/27/2022 32  % Final   Lymphs Abs 08/27/2022 2.5  0.7 - 4.0 K/uL Final   Monocytes Relative 08/27/2022 5  % Final   Monocytes Absolute 08/27/2022 0.4  0.1 - 1.0 K/uL Final   Eosinophils Relative 08/27/2022 2  % Final   Eosinophils Absolute 08/27/2022 0.1  0.0 - 0.5 K/uL Final   Basophils Relative 08/27/2022 1  % Final   Basophils Absolute 08/27/2022 0.1  0.0 - 0.1 K/uL Final   Immature Granulocytes 08/27/2022 0  % Final   Abs Immature Granulocytes 08/27/2022 0.02  0.00 - 0.07 K/uL Final   Performed at [facility], [address], [place], [place] [address]   Sodium 08/27/2022 133 (L)  135 - 145 mmol/L Final   Potassium 08/27/2022 3.9  3.5 - 5.1 mmol/L Final   Chloride 08/27/2022 100  98 - 111 mmol/L  Final   CO2 08/27/2022 24  22 - 32 mmol/L Final   Glucose, Bld 08/27/2022 142 (H)  70 - 99 mg/dL Final   Glucose reference range applies only to samples taken after fasting for at least 8 hours.   BUN 08/27/2022 8  6 - 20 mg/dL Final   Creatinine, Ser 08/27/2022 0.80  0.61 - 1.24 mg/dL Final   Calcium 31/51/7616 9.0  8.9 - 10.3 mg/dL Final   Total Protein 07/37/1062 7.4  6.5 - 8.1 g/dL Final   Albumin 69/48/5462 3.9  3.5 - 5.0 g/dL Final   AST 70/35/0093 26  15 - 41 U/L Final   ALT 08/27/2022 28  0 - 44 U/L Final   Alkaline Phosphatase 08/27/2022 48  38 - 126 U/L Final   Total Bilirubin 08/27/2022 0.7  0.3 - 1.2 mg/dL Final   GFR, Estimated 08/27/2022 >60  >  60 mL/min Final   Comment: (NOTE) Calculated using the CKD-EPI Creatinine Equation (2021)    Anion gap 08/27/2022 9  5 - 15 Final   Performed at [facility], [address] [place], [place] [address]   Specimen Source 08/27/2022 URINE, CLEAN CATCH   Final   Color, Urine 08/27/2022 YELLOW  YELLOW Final   APPearance 08/27/2022 CLEAR  CLEAR Final   Specific Gravity, Urine 08/27/2022 1.011  1.005 - 1.030 Final   pH 08/27/2022 5.0  5.0 - 8.0 Final   Glucose, UA 08/27/2022 NEGATIVE  NEGATIVE mg/dL Final   Hgb urine dipstick 08/27/2022 NEGATIVE  NEGATIVE Final   Bilirubin Urine 08/27/2022 NEGATIVE  NEGATIVE Final   Ketones, ur 08/27/2022 NEGATIVE  NEGATIVE mg/dL Final   Protein, ur 60/45/4098 NEGATIVE  NEGATIVE mg/dL Final   Nitrite 11/91/4782 NEGATIVE  NEGATIVE Final   Leukocytes,Ua 08/27/2022 NEGATIVE  NEGATIVE Final   RBC / HPF 08/27/2022 0-5  0 - 5 RBC/hpf Final   WBC, UA 08/27/2022 0-5  0 - 5 WBC/hpf Final   Comment:        Reflex urine culture not performed if WBC <=10, OR if Squamous epithelial cells >5. If Squamous epithelial cells >5 suggest recollection.    Bacteria, UA 08/27/2022 NONE SEEN  NONE SEEN Final   Squamous Epithelial / HPF 08/27/2022 0-5  0 - 5 /HPF Final   Mucus 08/27/2022 PRESENT   Final    Performed at [facility], [address] [place], [place] [address]   Opiates 08/27/2022 POSITIVE (A)  NONE DETECTED Final   Cocaine 08/27/2022 NONE DETECTED  NONE DETECTED Final   Benzodiazepines 08/27/2022 NONE DETECTED  NONE DETECTED Final   Amphetamines 08/27/2022 NONE DETECTED  NONE DETECTED Final   Tetrahydrocannabinol 08/27/2022 NONE DETECTED  NONE DETECTED Final   Barbiturates 08/27/2022 NONE DETECTED  NONE DETECTED Final   Comment: (NOTE) DRUG SCREEN FOR MEDICAL PURPOSES ONLY.  IF CONFIRMATION IS NEEDED FOR ANY PURPOSE, NOTIFY LAB WITHIN 5 DAYS.  LOWEST DETECTABLE LIMITS FOR URINE DRUG SCREEN Drug Class                     Cutoff (ng/mL) Amphetamine and metabolites    1000 Barbiturate and metabolites    200 Benzodiazepine                 200 Opiates and metabolites        300 Cocaine and metabolites        300 THC                            50 Performed at [facility] Lab, [address], [place], [place] [address]    Magnesium 08/27/2022 1.9  1.7 - 2.4 mg/dL Final   Performed at [facility], [address], [place], [place] [address]   WBC 08/28/2022 9.0  4.0 - 10.5 K/uL Final   RBC 08/28/2022 5.27  4.22 - 5.81 MIL/uL Final   Hemoglobin 08/28/2022 14.6  13.0 - 17.0 g/dL Final   HCT 62/95/2841 44.5  39.0 - 52.0 % Final   MCV 08/28/2022 84.4  80.0 - 100.0 fL Final   MCH 08/28/2022 27.7  26.0 - 34.0 pg Final   MCHC 08/28/2022 32.8  30.0 - 36.0 g/dL Final   RDW 32/44/0102 14.0  11.5 - 15.5 % Final   Platelets 08/28/2022 242  150 - 400 K/uL Final  nRBC 08/28/2022 0.0  0.0 - 0.2 % Final   Neutrophils Relative % 08/28/2022 66  % Final   Neutro Abs 08/28/2022 6.0  1.7 - 7.7 K/uL Final   Lymphocytes Relative 08/28/2022 26  % Final   Lymphs Abs 08/28/2022 2.3  0.7 - 4.0 K/uL Final   Monocytes Relative 08/28/2022 6  % Final   Monocytes Absolute 08/28/2022 0.5  0.1 - 1.0 K/uL Final   Eosinophils Relative 08/28/2022 1  % Final   Eosinophils Absolute  08/28/2022 0.1  0.0 - 0.5 K/uL Final   Basophils Relative 08/28/2022 1  % Final   Basophils Absolute 08/28/2022 0.1  0.0 - 0.1 K/uL Final   Immature Granulocytes 08/28/2022 0  % Final   Abs Immature Granulocytes 08/28/2022 0.03  0.00 - 0.07 K/uL Final   Performed at [facility], [address], [place], [place] [address]   Sodium 08/28/2022 133 (L)  135 - 145 mmol/L Final   Potassium 08/28/2022 3.8  3.5 - 5.1 mmol/L Final   Chloride 08/28/2022 99  98 - 111 mmol/L Final   CO2 08/28/2022 26  22 - 32 mmol/L Final   Glucose, Bld 08/28/2022 107 (H)  70 - 99 mg/dL Final   Glucose reference range applies only to samples taken after fasting for at least 8 hours.   BUN 08/28/2022 14  6 - 20 mg/dL Final   Creatinine, Ser 08/28/2022 0.85  0.61 - 1.24 mg/dL Final   Calcium 85/46/2703 9.0  8.9 - 10.3 mg/dL Final   Total Protein 50/11/3816 6.7  6.5 - 8.1 g/dL Final   Albumin 29/93/7169 3.8  3.5 - 5.0 g/dL Final   AST 67/89/3810 31  15 - 41 U/L Final   ALT 08/28/2022 34  0 - 44 U/L Final   Alkaline Phosphatase 08/28/2022 52  38 - 126 U/L Final   Total Bilirubin 08/28/2022 0.6  0.3 - 1.2 mg/dL Final   GFR, Estimated 08/28/2022 >60  >60 mL/min Final   Comment: (NOTE) Calculated using the CKD-EPI Creatinine Equation (2021)    Anion gap 08/28/2022 8  5 - 15 Final   Performed at [facility] Lab, [address], [place], [place] [address]   Magnesium 08/28/2022 2.0  1.7 - 2.4 mg/dL Final   Performed at [facility], [address], [place], [place] [address]   Phosphorus 08/28/2022 4.3  2.5 - 4.6 mg/dL Final   Performed at [facility], [address], [place], [place] [address]   WBC 08/29/2022 7.5  4.0 - 10.5 K/uL Final   RBC 08/29/2022 4.71  4.22 - 5.81 MIL/uL Final   Hemoglobin 08/29/2022 13.8  13.0 - 17.0 g/dL Final   HCT 23/53/6144 40.5  39.0 - 52.0 % Final   MCV 08/29/2022 86.0  80.0 - 100.0 fL Final   MCH 08/29/2022 29.3  26.0 - 34.0 pg Final   MCHC 08/29/2022 34.1  30.0  - 36.0 g/dL Final   RDW 31/54/0086 13.8  11.5 - 15.5 % Final   Platelets 08/29/2022 212  150 - 400 K/uL Final   nRBC 08/29/2022 0.0  0.0 - 0.2 % Final   Neutrophils Relative % 08/29/2022 48  % Final   Neutro Abs 08/29/2022 3.7  1.7 - 7.7 K/uL Final   Lymphocytes Relative 08/29/2022 40  % Final   Lymphs Abs 08/29/2022 3.0  0.7 - 4.0 K/uL Final   Monocytes Relative 08/29/2022 7  % Final   Monocytes Absolute 08/29/2022 0.5  0.1 - 1.0 K/uL Final   Eosinophils Relative 08/29/2022 4  % Final   Eosinophils Absolute 08/29/2022 0.3  0.0 - 0.5 K/uL Final   Basophils Relative 08/29/2022 1  % Final   Basophils Absolute 08/29/2022 0.1  0.0 - 0.1 K/uL Final   Immature Granulocytes 08/29/2022 0  % Final   Abs Immature Granulocytes 08/29/2022 0.02  0.00 - 0.07 K/uL Final   Performed at [facility] Lab, [address] [place], [place] [address]   Sodium 08/29/2022 135  135 - 145 mmol/L Final   Potassium 08/29/2022 4.0  3.5 - 5.1 mmol/L Final   Chloride 08/29/2022 99  98 - 111 mmol/L Final   CO2 08/29/2022 27  22 - 32 mmol/L Final   Glucose, Bld 08/29/2022 179 (H)  70 - 99 mg/dL Final   Glucose reference range applies only to samples taken after fasting for at least 8 hours.   BUN 08/29/2022 13  6 - 20 mg/dL Final   Creatinine, Ser 08/29/2022 0.99  0.61 - 1.24 mg/dL Final   Calcium 34/74/2595 9.1  8.9 - 10.3 mg/dL Final   Total Protein 63/87/5643 6.6  6.5 - 8.1 g/dL Final   Albumin 32/95/1884 3.6  3.5 - 5.0 g/dL Final   AST 16/60/6301 29  15 - 41 U/L Final   ALT 08/29/2022 34  0 - 44 U/L Final   Alkaline Phosphatase 08/29/2022 51  38 - 126 U/L Final   Total Bilirubin 08/29/2022 0.6  0.3 - 1.2 mg/dL Final   GFR, Estimated 08/29/2022 >60  >60 mL/min Final   Comment: (NOTE) Calculated using the CKD-EPI Creatinine Equation (2021)    Anion gap 08/29/2022 9  5 - 15 Final   Performed at [facility] Lab, [address], [place], [place] [address]   Phosphorus 08/29/2022 4.3  2.5 - 4.6 mg/dL Final    Performed at [facility] Lab, [address], [place], [place] [address]   Magnesium 08/29/2022 1.9  1.7 - 2.4 mg/dL Final   Performed at [facility], [address], [place], [place] [address]    Blood Alcohol level:  Lab Results  Component Value Date   [facility] <10 12/14/2022   ETH <10 12/12/2022    Metabolic Disorder Labs: Lab Results  Component Value Date   HGBA1C 7.7 (H) 12/12/2022   MPG 174.29 12/12/2022   MPG 136.98 02/10/2020   Lab Results  Component Value Date   PROLACTIN 7.0 12/12/2022   Lab Results  Component Value Date   CHOL 184 12/12/2022   TRIG 250 (H) 12/12/2022   HDL 37 (L) 12/12/2022   CHOLHDL 5.0 12/12/2022   VLDL 50 (H) 12/12/2022   LDLCALC 97 12/12/2022    Therapeutic Lab Levels: No results found for: "LITHIUM" No results found for: "VALPROATE" No results found for: "CBMZ"  Physical Findings   PHQ2-9    Flowsheet Row ED from 12/14/2022 in [facility] ED from 12/12/2022 in [place]  PHQ-2 Total Score 0 0      Flowsheet Row ED from 12/14/2022 in [facility] Most recent reading at 12/14/2022  9:31 AM ED from 12/14/2022 in [facility] Emergency Department at [facility] Most recent reading at 12/14/2022 12:40 AM ED from 12/12/2022 in [facility] Most recent reading at 12/12/2022  5:26 PM  C-SSRS RISK CATEGORY No Risk No Risk No Risk  Musculoskeletal  Strength & Muscle Tone: within normal limits Gait & Station: normal Patient leans: N/A  Psychiatric Specialty Exam  Presentation  General Appearance:  Appropriate for Environment; Casual  Eye Contact: Fair  Speech: Clear and Coherent; Normal Rate  Speech Volume: Normal  Handedness: Right   Mood and Affect  Mood: Anxious  Affect: Congruent   Thought Process  Thought Processes: Coherent; Goal Directed; Linear  Descriptions of  Associations:Intact  Orientation:Full (Time, Place and Person)  Thought Content:Logical; WDL  Diagnosis of Schizophrenia or Schizoaffective disorder in past: No    Hallucinations:Hallucinations: None  Ideas of Reference:None  Suicidal Thoughts:Suicidal Thoughts: No  Homicidal Thoughts:Homicidal Thoughts: No   Sensorium  Memory: Remote Good  Judgment: Fair  Insight: Fair   Art therapist  Concentration: Good  Attention Span: Good  Recall: Good  Fund of Knowledge: Good  Language: Good   Psychomotor Activity  Psychomotor Activity: Psychomotor Activity: Normal   Assets  Assets: Communication Skills; Desire for Improvement   Sleep  Sleep: Sleep: Poor   No data recorded   Physical Exam  Physical Exam Vitals reviewed.  Constitutional:      Appearance: Normal appearance.  HENT:     Head: Normocephalic and atraumatic.  Cardiovascular:     Rate and Rhythm: Normal rate.  Pulmonary:     Effort: Pulmonary effort is normal.  Neurological:     Mental Status: He is alert.    Review of Systems  Constitutional:  Negative for diaphoresis and fever.  Gastrointestinal:  Negative for nausea and vomiting.  Musculoskeletal:  Positive for myalgias.  Neurological:  Negative for tremors.   Blood pressure 138/76, pulse 88, temperature 98.3 F (36.8 C), temperature source Oral, resp. rate 17, SpO2 96%. There is no height or weight on file to calculate BMI.  Treatment Plan Summary:   Alcohol Use Disorder Alcohol Withdrawal Unfortunately, patient received opioids when he went to the ED so we will NOT continue Naltrexone. Otherwise will continue: -Thiamine 100 mg PO -Multivitamin with minerals daily -Tylenol 650 mg every 6 hours as needed for pain -Zofran 4 mg every 6 hours as needed for nausea or vomiting -Imodium 2 to 4 mg as needed for diarrhea or loose stools - Continue Gabapentin 300 mg TID - Hydroxyzine 25 mg q6h PRN for anxiety -  Trazodone 50 mg at bedtime PRN insomnia    Tobacco Use Disorder -Nicotine patch 14 mg   Continue home meds: albuterol, amlodipine, neurontin, lisinopril   Dispo: pending  Lance Muss, MD 12/18/2022 8:58 AM

## 2022-12-18 NOTE — Group Note (Signed)
Group Topic: Change and Accountability  Group Date: 12/18/2022 Start Time: 1435 End Time: 1525 Facilitators: Vonzell Schlatter B  Department: [facility]  Number of Participants: 6  Group Focus: activities of daily living skills and daily focus Treatment Modality:  Psychoeducation Interventions utilized were problem solving and support Purpose: express feelings and regain self-worth  Name: Kristopher [name] Date of Birth: Jan 07, 1971  MR: [id number]    Level of Participation: active Quality of Participation: attentive and cooperative Interactions with others: gave feedback Mood/Affect: positive Triggers (if applicable): n/a Cognition: coherent/clear Progress: Moderate Response: n/a Plan: follow-up needed  Patients Problems:  Patient Active Problem List   Diagnosis Date Noted   Alcohol use disorder, severe, dependence (HCC) 12/12/2022   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

## 2023-09-19 ENCOUNTER — Emergency Department
Admission: EM | Admit: 2023-09-19 | Discharge: 2023-09-19 | Disposition: A | Discharge status: Home / Self Care | Attending: Emergency Medicine | Admitting: Emergency Medicine

## 2023-09-19 ENCOUNTER — Other Ambulatory Visit: Payer: Self-pay

## 2023-09-19 ENCOUNTER — Emergency Department

## 2023-09-19 DIAGNOSIS — R1013 Epigastric pain: Secondary | ICD-10-CM | POA: Insufficient documentation

## 2023-09-19 DIAGNOSIS — R1011 Right upper quadrant pain: Secondary | ICD-10-CM | POA: Insufficient documentation

## 2023-09-19 DIAGNOSIS — R197 Diarrhea, unspecified: Secondary | ICD-10-CM | POA: Insufficient documentation

## 2023-09-19 DIAGNOSIS — J449 Chronic obstructive pulmonary disease, unspecified: Secondary | ICD-10-CM | POA: Diagnosis not present

## 2023-09-19 DIAGNOSIS — R112 Nausea with vomiting, unspecified: Secondary | ICD-10-CM | POA: Insufficient documentation

## 2023-09-19 LAB — URINALYSIS, ROUTINE W REFLEX MICROSCOPIC
Bilirubin Urine: NEGATIVE
Glucose, UA: 50 mg/dL — AB
Hgb urine dipstick: NEGATIVE
Ketones, ur: NEGATIVE mg/dL
Leukocytes,Ua: NEGATIVE
Nitrite: NEGATIVE
Protein, ur: NEGATIVE mg/dL
Specific Gravity, Urine: 1.001 — ABNORMAL LOW (ref 1.005–1.030)
pH: 6 (ref 5.0–8.0)

## 2023-09-19 LAB — LIPASE, BLOOD: Lipase: 25 U/L (ref 11–51)

## 2023-09-19 LAB — CBC
HCT: 45.1 % (ref 39.0–52.0)
Hemoglobin: 15 g/dL (ref 13.0–17.0)
MCH: 28.7 pg (ref 26.0–34.0)
MCHC: 33.3 g/dL (ref 30.0–36.0)
MCV: 86.4 fL (ref 80.0–100.0)
Platelets: 221 10*3/uL (ref 150–400)
RBC: 5.22 MIL/uL (ref 4.22–5.81)
RDW: 13.4 % (ref 11.5–15.5)
WBC: 10.8 10*3/uL — ABNORMAL HIGH (ref 4.0–10.5)
nRBC: 0 % (ref 0.0–0.2)

## 2023-09-19 LAB — COMPREHENSIVE METABOLIC PANEL WITH GFR
ALT: 34 U/L (ref 0–44)
AST: 28 U/L (ref 15–41)
Albumin: 4.3 g/dL (ref 3.5–5.0)
Alkaline Phosphatase: 53 U/L (ref 38–126)
Anion gap: 10 (ref 5–15)
BUN: 13 mg/dL (ref 6–20)
CO2: 25 mmol/L (ref 22–32)
Calcium: 9.1 mg/dL (ref 8.9–10.3)
Chloride: 98 mmol/L (ref 98–111)
Creatinine, Ser: 0.69 mg/dL (ref 0.61–1.24)
GFR, Estimated: >60 mL/min (ref >60)
Glucose, Bld: 186 mg/dL — ABNORMAL HIGH (ref 70–99)
Potassium: 3.5 mmol/L (ref 3.5–5.1)
Sodium: 133 mmol/L — ABNORMAL LOW (ref 135–145)
Total Bilirubin: 0.8 mg/dL (ref 0.0–1.2)
Total Protein: 7.4 g/dL (ref 6.5–8.1)

## 2023-09-19 MED ORDER — HYDROMORPHONE HCL 1 MG/ML IJ SOLN
1.0000 mg | Freq: Once | INTRAMUSCULAR | Status: AC
  Administered 2023-09-19: 1 mg via INTRAVENOUS
  Filled 2023-09-19: qty 1

## 2023-09-19 MED ORDER — OXYCODONE HCL 5 MG PO TABS
5.0000 mg | ORAL_TABLET | Freq: Four times a day (QID) | ORAL | 0 refills | Status: AC | PRN
Start: 2023-09-19 — End: 2023-09-22

## 2023-09-19 MED ORDER — ALUM & MAG HYDROXIDE-SIMETH 200-200-20 MG/5ML PO SUSP
30.0000 mL | Freq: Once | ORAL | Status: AC
  Administered 2023-09-19: 30 mL via ORAL
  Filled 2023-09-19: qty 30

## 2023-09-19 MED ORDER — ONDANSETRON HCL 4 MG/2ML IJ SOLN
4.0000 mg | Freq: Once | INTRAMUSCULAR | Status: DC
  Filled 2023-09-19: qty 2

## 2023-09-19 MED ORDER — ONDANSETRON HCL 4 MG PO TABS: 4.0000 mg | ORAL_TABLET | Freq: Four times a day (QID) | ORAL | 0 refills | Status: AC | PRN

## 2023-09-19 MED ORDER — ONDANSETRON HCL 4 MG/2ML IJ SOLN
4.0000 mg | Freq: Once | INTRAMUSCULAR | Status: AC
  Administered 2023-09-19: 4 mg via INTRAVENOUS
  Filled 2023-09-19: qty 2

## 2023-09-19 MED ORDER — HYDROMORPHONE HCL 1 MG/ML IJ SOLN
0.5000 mg | Freq: Once | INTRAMUSCULAR | Status: AC
  Administered 2023-09-19: 0.5 mg via INTRAVENOUS
  Filled 2023-09-19: qty 0.5

## 2023-09-19 MED ORDER — SODIUM CHLORIDE 0.9 % IV BOLUS
1000.0000 mL | Freq: Once | INTRAVENOUS | Status: AC
  Administered 2023-09-19: 1000 mL via INTRAVENOUS

## 2023-09-19 NOTE — Discharge Instructions (Signed)
 Your testing today fortunately not show emergent cause for your pain.  I sent a short course of pain and nausea medicine to your pharmacy.  Do not drive or operate machinery when taking pain medicine.  Return to the ER for new or worsening symptoms.

## 2023-09-19 NOTE — ED Notes (Signed)
 Patient requesting another dose of pain medication. Patient encouraged to try other forms of pain relief due to recent Dilaudid  administration. MD Ray notified. No new orders.

## 2023-09-19 NOTE — ED Notes (Signed)
 Attempted to initiate an IV on the patient without success. Requested assistance from another nurse.

## 2023-09-19 NOTE — ED Provider Notes (Signed)
 [facility] Provider Note    Event Date/Time   First MD Initiated Contact with Patient 09/19/23 1030     (approximate)   History   Abdominal Pain   HPI  Kristopher [name] is a 53 year old male with history of pancreatitis, COPD, alcohol use disorder presenting to the emergency department for evaluation of epigastric and right upper quadrant abdominal pain.  Pain started last night, consistent with prior episodes of pancreatitis.  Reports associated nausea, vomiting, diarrhea.      Physical Exam   Triage Vital Signs: ED Triage Vitals  Encounter Vitals Group     BP 09/19/23 0909 (!) 163/101     Girls Systolic BP Percentile --      Girls Diastolic BP Percentile --      Boys Systolic BP Percentile --      Boys Diastolic BP Percentile --      Pulse Rate 09/19/23 0909 (!) 127     Resp 09/19/23 0909 18     Temp 09/19/23 0909 98.2 F (36.8 C)     Temp Source 09/19/23 0909 Oral     SpO2 09/19/23 0909 97 %     Weight 09/19/23 0911 274 lb (124.3 kg)     Height 09/19/23 0911 5' 11 (1.803 m)     Head Circumference --      Peak Flow --      Pain Score 09/19/23 0909 9     Pain Loc --      Pain Education --      Exclude from Growth Chart --     Most recent vital signs: Vitals:   09/19/23 0909 09/19/23 1322  BP: (!) 163/101   Pulse: (!) 127 (!) 104  Resp: 18   Temp: 98.2 F (36.8 C)   SpO2: 97% 94%     General: Awake, interactive  CV:  Good peripheral perfusion Resp:  Unlabored respirations.  Abd:  Nondistended, soft, nontender to palpation  Neuro:  Symmetric facial movement, fluid speech   ED Results / Procedures / Treatments   Labs (all labs ordered are listed, but only abnormal results are displayed) Labs Reviewed  COMPREHENSIVE METABOLIC PANEL WITH GFR - Abnormal; Notable for the following components:      Result Value   Sodium 133 (*)    Glucose, Bld 186 (*)    All other components within normal limits  CBC - Abnormal; Notable  for the following components:   WBC 10.8 (*)    All other components within normal limits  URINALYSIS, ROUTINE W REFLEX MICROSCOPIC - Abnormal; Notable for the following components:   Color, Urine COLORLESS (*)    APPearance CLEAR (*)    Specific Gravity, Urine 1.001 (*)    Glucose, UA 50 (*)    All other components within normal limits  LIPASE, BLOOD     EKG EKG independently reviewed and interpreted by myself demonstrates:    RADIOLOGY Imaging independently reviewed and interpreted by myself demonstrates:  RUQ US  without gallstones  Formal Radiology Read:  US  ABDOMEN LIMITED RUQ (LIVER/GB) Result Date: 09/19/2023 CLINICAL DATA:  Right upper quadrant pain times 12 hours. EXAM: ULTRASOUND ABDOMEN LIMITED RIGHT UPPER QUADRANT COMPARISON:  May 10, 2022 FINDINGS: Gallbladder: No gallstones or wall thickening visualized (3.0 mm). No sonographic Murphy sign noted by sonographer. Common bile duct: Diameter: 3.2 mm Liver: No focal lesion identified. Increased echogenicity of the liver parenchyma is noted. Portal vein is patent on color Doppler imaging with normal direction of  blood flow towards the liver. Other: None. IMPRESSION: Hepatic steatosis. Electronically Signed   By: Suzen Dials M.D.   On: 09/19/2023 10:28    PROCEDURES:  Critical Care performed: No  Procedures   MEDICATIONS ORDERED IN ED: Medications  ondansetron  (ZOFRAN ) injection 4 mg (4 mg Intravenous Given 09/19/23 1105)  HYDROmorphone  (DILAUDID ) injection 1 mg (1 mg Intravenous Given 09/19/23 1106)  alum & mag hydroxide-simeth (MAALOX/MYLANTA) 200-200-20 MG/5ML suspension 30 mL (30 mLs Oral Given 09/19/23 1104)  sodium chloride  0.9 % bolus 1,000 mL (1,000 mLs Intravenous New Bag/Given 09/19/23 1104)  HYDROmorphone  (DILAUDID ) injection 0.5 mg (0.5 mg Intravenous Given 09/19/23 1250)     IMPRESSION / MDM / ASSESSMENT AND PLAN / ED COURSE  I reviewed the triage vital signs and the nursing notes.  Differential  diagnosis includes, but is not limited to, pancreatitis, alcoholic gastritis, biliary pathology, lower suspicion other acute intra-abdominal process  Patient's presentation is most consistent with acute presentation with potential threat to life or bodily function.  53 year old male presenting to the emergency department for evaluation of abdominal pain.  Tachycardic on presentation in the setting of acute pain.  Labs with mild leukocytosis.  CMP without critical derangements.  Normal lipase.  Urine without evidence of infection.  Suspect possible component of chronic pancreatitis though alcoholic gastritis is also a consideration.  RUQ US  without biliary pathology.  Patient treated with IV fluids, pain medication, GI cocktail.  Did report improvement with this.  No further episodes of vomiting.  Discussed discharge with short course of pain medication and patient is agreeable.  Strict return precautions provided.  Patient discharged in stable condition.      FINAL CLINICAL IMPRESSION(S) / ED DIAGNOSES   Final diagnoses:  Epigastric pain     Rx / DC Orders   ED Discharge Orders          Ordered    oxyCODONE  (ROXICODONE ) 5 MG immediate release tablet  Every 6 hours PRN        09/19/23 1324    ondansetron  (ZOFRAN ) 4 MG tablet  Every 6 hours PRN        09/19/23 1324             Note:  This document was prepared using Dragon voice recognition software and may include unintentional dictation errors.   Levander Slate, MD 09/19/23 1325

## 2023-09-19 NOTE — ED Triage Notes (Signed)
 Pt arrives via POV with c/o RUQ pain that started last night. Pt has been experiencing severe abdominal pain, N/V/D. Pt has vomitted 3x since last night and yellow bile. Pt has a Hx of pancreatis. Pt is A&Ox4 and ambulatory during triage.

## 2023-10-13 ENCOUNTER — Emergency Department
Admission: EM | Admit: 2023-10-13 | Discharge: 2023-10-13 | Disposition: A | Discharge status: Home / Self Care | Attending: Emergency Medicine | Admitting: Emergency Medicine

## 2023-10-13 ENCOUNTER — Emergency Department

## 2023-10-13 ENCOUNTER — Other Ambulatory Visit: Payer: Self-pay

## 2023-10-13 DIAGNOSIS — R112 Nausea with vomiting, unspecified: Secondary | ICD-10-CM | POA: Insufficient documentation

## 2023-10-13 DIAGNOSIS — F1010 Alcohol abuse, uncomplicated: Secondary | ICD-10-CM | POA: Insufficient documentation

## 2023-10-13 DIAGNOSIS — R1013 Epigastric pain: Secondary | ICD-10-CM | POA: Diagnosis present

## 2023-10-13 DIAGNOSIS — K2920 Alcoholic gastritis without bleeding: Secondary | ICD-10-CM

## 2023-10-13 LAB — CBC
HCT: 46.3 % (ref 39.0–52.0)
Hemoglobin: 15.6 g/dL (ref 13.0–17.0)
MCH: 29.6 pg (ref 26.0–34.0)
MCHC: 33.7 g/dL (ref 30.0–36.0)
MCV: 87.9 fL (ref 80.0–100.0)
Platelets: 236 K/uL (ref 150–400)
RBC: 5.27 MIL/uL (ref 4.22–5.81)
RDW: 16.6 % — ABNORMAL HIGH (ref 11.5–15.5)
WBC: 9.7 K/uL (ref 4.0–10.5)
nRBC: 0 % (ref 0.0–0.2)

## 2023-10-13 LAB — COMPREHENSIVE METABOLIC PANEL WITH GFR
ALT: 51 U/L — ABNORMAL HIGH (ref 0–44)
AST: 70 U/L — ABNORMAL HIGH (ref 15–41)
Albumin: 4 g/dL (ref 3.5–5.0)
Alkaline Phosphatase: 84 U/L (ref 38–126)
Anion gap: 12 (ref 5–15)
BUN: 11 mg/dL (ref 6–20)
CO2: 21 mmol/L — ABNORMAL LOW (ref 22–32)
Calcium: 8.9 mg/dL (ref 8.9–10.3)
Chloride: 102 mmol/L (ref 98–111)
Creatinine, Ser: 0.75 mg/dL (ref 0.61–1.24)
GFR, Estimated: >60 mL/min (ref >60)
Glucose, Bld: 165 mg/dL — ABNORMAL HIGH (ref 70–99)
Potassium: 5 mmol/L (ref 3.5–5.1)
Sodium: 135 mmol/L (ref 135–145)
Total Bilirubin: 1.7 mg/dL — ABNORMAL HIGH (ref 0.0–1.2)
Total Protein: 7.8 g/dL (ref 6.5–8.1)

## 2023-10-13 LAB — URINALYSIS, ROUTINE W REFLEX MICROSCOPIC
Bacteria, UA: NONE SEEN
Bilirubin Urine: NEGATIVE
Glucose, UA: NEGATIVE mg/dL
Hgb urine dipstick: NEGATIVE
Ketones, ur: 20 mg/dL — AB
Leukocytes,Ua: NEGATIVE
Nitrite: NEGATIVE
Protein, ur: 100 mg/dL — AB
Specific Gravity, Urine: 1.019 (ref 1.005–1.030)
pH: 7 (ref 5.0–8.0)

## 2023-10-13 LAB — ETHANOL: Alcohol, Ethyl (B): <15 mg/dL (ref <15)

## 2023-10-13 LAB — LIPASE, BLOOD: Lipase: 27 U/L (ref 11–51)

## 2023-10-13 LAB — CBG MONITORING, ED: Glucose-Capillary: 163 mg/dL — ABNORMAL HIGH (ref 70–99)

## 2023-10-13 MED ORDER — MORPHINE SULFATE (PF) 4 MG/ML IV SOLN
4.0000 mg | INTRAVENOUS | Status: AC | PRN
  Administered 2023-10-13 (×2): 4 mg via INTRAVENOUS
  Filled 2023-10-13 (×2): qty 1

## 2023-10-13 MED ORDER — LACTATED RINGERS IV BOLUS
1000.0000 mL | Freq: Once | INTRAVENOUS | Status: AC
  Administered 2023-10-13: 1000 mL via INTRAVENOUS

## 2023-10-13 MED ORDER — SUCRALFATE 1 G PO TABS: 1.0000 g | ORAL_TABLET | Freq: Three times a day (TID) | ORAL | 2 refills | Status: DC

## 2023-10-13 MED ORDER — LIDOCAINE VISCOUS HCL 2 % MT SOLN
15.0000 mL | Freq: Once | OROMUCOSAL | Status: AC
  Administered 2023-10-13: 15 mL via OROMUCOSAL
  Filled 2023-10-13: qty 15

## 2023-10-13 MED ORDER — ALUM & MAG HYDROXIDE-SIMETH 200-200-20 MG/5ML PO SUSP
30.0000 mL | Freq: Once | ORAL | Status: AC
  Administered 2023-10-13: 30 mL via ORAL
  Filled 2023-10-13: qty 30

## 2023-10-13 MED ORDER — PHENOBARBITAL SODIUM 65 MG/ML IJ SOLN
260.0000 mg | INTRAMUSCULAR | Status: AC
  Administered 2023-10-13: 260 mg via INTRAVENOUS
  Filled 2023-10-13: qty 4

## 2023-10-13 MED ORDER — DIPHENHYDRAMINE HCL 50 MG/ML IJ SOLN
25.0000 mg | INTRAMUSCULAR | Status: DC
  Filled 2023-10-13: qty 1

## 2023-10-13 MED ORDER — OXYCODONE HCL 5 MG PO TABS: 5.0000 mg | ORAL_TABLET | Freq: Four times a day (QID) | ORAL | 0 refills | Status: AC | PRN

## 2023-10-13 MED ORDER — ONDANSETRON HCL 4 MG/2ML IJ SOLN
4.0000 mg | Freq: Once | INTRAMUSCULAR | Status: AC
  Administered 2023-10-13: 4 mg via INTRAVENOUS
  Filled 2023-10-13: qty 2

## 2023-10-13 MED ORDER — DROPERIDOL 2.5 MG/ML IJ SOLN
2.5000 mg | Freq: Once | INTRAMUSCULAR | Status: DC
  Filled 2023-10-13: qty 2

## 2023-10-13 MED ORDER — ONDANSETRON 8 MG PO TBDP: 8.0000 mg | ORAL_TABLET | Freq: Three times a day (TID) | ORAL | 0 refills | Status: DC | PRN

## 2023-10-13 MED ORDER — THIAMINE HCL 100 MG/ML IJ SOLN
100.0000 mg | Freq: Once | INTRAMUSCULAR | Status: AC
  Administered 2023-10-13: 100 mg via INTRAVENOUS
  Filled 2023-10-13: qty 2

## 2023-10-13 MED ORDER — OMEPRAZOLE 40 MG PO CPDR: 40.0000 mg | DELAYED_RELEASE_CAPSULE | Freq: Every day | ORAL | 0 refills | Status: DC

## 2023-10-13 NOTE — ED Notes (Signed)
 Pt presented to ED with c/o abd pain x 3 days and nausea/vomiting starting last night. States heavy alcohol use the past couple of days, states does not know the exact amount he has been drinking but states its a lot. States last  drink was this AM. Pt tachycardiac and diaphoretic, actively vomiting in room. C/o abd pain 9/10. Hx pancreatis.

## 2023-10-13 NOTE — ED Triage Notes (Signed)
 Pt states that he has had abdominal pain and vomiting x 3 days. Pt reports symptoms are similar to when he has had pancreatitis. Pt reports that he has been drinking more ETOH than usual lately.
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
## 2023-11-06 ENCOUNTER — Other Ambulatory Visit: Payer: Self-pay

## 2023-11-06 ENCOUNTER — Emergency Department (HOSPITAL_COMMUNITY)

## 2023-11-06 ENCOUNTER — Inpatient Hospital Stay (HOSPITAL_COMMUNITY)
Admission: EM | Admit: 2023-11-06 | Discharge: 2023-11-11 | DRG: 897 | Disposition: A | Discharge status: Home / Self Care | Attending: Internal Medicine | Admitting: Internal Medicine

## 2023-11-06 DIAGNOSIS — Z833 Family history of diabetes mellitus: Secondary | ICD-10-CM

## 2023-11-06 DIAGNOSIS — M7989 Other specified soft tissue disorders: Secondary | ICD-10-CM | POA: Diagnosis not present

## 2023-11-06 DIAGNOSIS — F10231 Alcohol dependence with withdrawal delirium: Principal | ICD-10-CM | POA: Diagnosis present

## 2023-11-06 DIAGNOSIS — Z7984 Long term (current) use of oral hypoglycemic drugs: Secondary | ICD-10-CM | POA: Diagnosis not present

## 2023-11-06 DIAGNOSIS — Z6838 Body mass index (BMI) 38.0-38.9, adult: Secondary | ICD-10-CM | POA: Diagnosis not present

## 2023-11-06 DIAGNOSIS — E119 Type 2 diabetes mellitus without complications: Secondary | ICD-10-CM | POA: Diagnosis present

## 2023-11-06 DIAGNOSIS — E669 Obesity, unspecified: Secondary | ICD-10-CM | POA: Diagnosis present

## 2023-11-06 DIAGNOSIS — K760 Fatty (change of) liver, not elsewhere classified: Secondary | ICD-10-CM | POA: Diagnosis present

## 2023-11-06 DIAGNOSIS — K219 Gastro-esophageal reflux disease without esophagitis: Secondary | ICD-10-CM | POA: Diagnosis present

## 2023-11-06 DIAGNOSIS — Z91041 Radiographic dye allergy status: Secondary | ICD-10-CM | POA: Diagnosis not present

## 2023-11-06 DIAGNOSIS — I471 Supraventricular tachycardia, unspecified: Secondary | ICD-10-CM | POA: Diagnosis present

## 2023-11-06 DIAGNOSIS — J449 Chronic obstructive pulmonary disease, unspecified: Secondary | ICD-10-CM | POA: Diagnosis present

## 2023-11-06 DIAGNOSIS — Y907 Blood alcohol level of 200-239 mg/100 ml: Secondary | ICD-10-CM | POA: Diagnosis present

## 2023-11-06 DIAGNOSIS — F17210 Nicotine dependence, cigarettes, uncomplicated: Secondary | ICD-10-CM | POA: Diagnosis present

## 2023-11-06 DIAGNOSIS — K860 Alcohol-induced chronic pancreatitis: Secondary | ICD-10-CM | POA: Diagnosis present

## 2023-11-06 DIAGNOSIS — R569 Unspecified convulsions: Secondary | ICD-10-CM | POA: Diagnosis present

## 2023-11-06 DIAGNOSIS — I10 Essential (primary) hypertension: Secondary | ICD-10-CM | POA: Diagnosis present

## 2023-11-06 DIAGNOSIS — Z79899 Other long term (current) drug therapy: Secondary | ICD-10-CM

## 2023-11-06 DIAGNOSIS — E785 Hyperlipidemia, unspecified: Secondary | ICD-10-CM | POA: Diagnosis present

## 2023-11-06 DIAGNOSIS — G8929 Other chronic pain: Secondary | ICD-10-CM | POA: Diagnosis present

## 2023-11-06 DIAGNOSIS — F10930 Alcohol use, unspecified with withdrawal, uncomplicated: Secondary | ICD-10-CM | POA: Diagnosis not present

## 2023-11-06 DIAGNOSIS — R1013 Epigastric pain: Secondary | ICD-10-CM

## 2023-11-06 DIAGNOSIS — F10932 Alcohol use, unspecified with withdrawal with perceptual disturbance: Secondary | ICD-10-CM | POA: Diagnosis present

## 2023-11-06 DIAGNOSIS — F10939 Alcohol use, unspecified with withdrawal, unspecified: Secondary | ICD-10-CM | POA: Diagnosis present

## 2023-11-06 LAB — CBC
HCT: 45.7 % (ref 39.0–52.0)
HCT: 41.3 % (ref 39.0–52.0)
Hemoglobin: 15.2 g/dL (ref 13.0–17.0)
Hemoglobin: 13.8 g/dL (ref 13.0–17.0)
MCH: 29.9 pg (ref 26.0–34.0)
MCH: 29.8 pg (ref 26.0–34.0)
MCHC: 33.3 g/dL (ref 30.0–36.0)
MCHC: 33.4 g/dL (ref 30.0–36.0)
MCV: 90 fL (ref 80.0–100.0)
MCV: 89.2 fL (ref 80.0–100.0)
Platelets: 318 K/uL (ref 150–400)
Platelets: 271 K/uL (ref 150–400)
RBC: 5.08 MIL/uL (ref 4.22–5.81)
RBC: 4.63 MIL/uL (ref 4.22–5.81)
RDW: 16.9 % — ABNORMAL HIGH (ref 11.5–15.5)
RDW: 17.3 % — ABNORMAL HIGH (ref 11.5–15.5)
WBC: 10.1 K/uL (ref 4.0–10.5)
WBC: 9.4 K/uL (ref 4.0–10.5)
nRBC: 0 % (ref 0.0–0.2)
nRBC: 0 % (ref 0.0–0.2)

## 2023-11-06 LAB — HEMOGLOBIN A1C
Hgb A1c MFr Bld: 6.8 % — ABNORMAL HIGH (ref 4.8–5.6)
Mean Plasma Glucose: 148 mg/dL

## 2023-11-06 LAB — HEPATIC FUNCTION PANEL
ALT: 50 U/L — ABNORMAL HIGH (ref 0–44)
AST: 57 U/L — ABNORMAL HIGH (ref 15–41)
Albumin: 3.7 g/dL (ref 3.5–5.0)
Alkaline Phosphatase: 73 U/L (ref 38–126)
Bilirubin, Direct: 0.1 mg/dL (ref 0.0–0.2)
Indirect Bilirubin: 0.3 mg/dL (ref 0.3–0.9)
Total Bilirubin: 0.4 mg/dL (ref 0.0–1.2)
Total Protein: 6.8 g/dL (ref 6.5–8.1)

## 2023-11-06 LAB — BASIC METABOLIC PANEL WITH GFR
Anion gap: 15 (ref 5–15)
BUN: 6 mg/dL (ref 6–20)
CO2: 23 mmol/L (ref 22–32)
Calcium: 9.4 mg/dL (ref 8.9–10.3)
Chloride: 103 mmol/L (ref 98–111)
Creatinine, Ser: 0.78 mg/dL (ref 0.61–1.24)
GFR, Estimated: >60 mL/min (ref >60)
Glucose, Bld: 149 mg/dL — ABNORMAL HIGH (ref 70–99)
Potassium: 4 mmol/L (ref 3.5–5.1)
Sodium: 141 mmol/L (ref 135–145)

## 2023-11-06 LAB — HIV ANTIBODY (ROUTINE TESTING W REFLEX): HIV Screen 4th Generation wRfx: NONREACTIVE

## 2023-11-06 LAB — CREATININE, SERUM
Creatinine, Ser: 0.74 mg/dL (ref 0.61–1.24)
GFR, Estimated: >60 mL/min (ref >60)

## 2023-11-06 LAB — TROPONIN I (HIGH SENSITIVITY)
Troponin I (High Sensitivity): 6 ng/L (ref <18)
Troponin I (High Sensitivity): 5 ng/L (ref <18)

## 2023-11-06 LAB — LIPASE, BLOOD: Lipase: 44 U/L (ref 11–51)

## 2023-11-06 LAB — CBG MONITORING, ED: Glucose-Capillary: 132 mg/dL — ABNORMAL HIGH (ref 70–99)

## 2023-11-06 LAB — AMMONIA: Ammonia: 32 umol/L (ref 9–35)

## 2023-11-06 LAB — ETHANOL: Alcohol, Ethyl (B): 227 mg/dL — ABNORMAL HIGH (ref <15)

## 2023-11-06 MED ORDER — MENTHOL 3 MG MT LOZG
1.0000 | LOZENGE | OROMUCOSAL | Status: DC | PRN
  Administered 2023-11-06 (×2): 3 mg via ORAL
  Filled 2023-11-06: qty 9

## 2023-11-06 MED ORDER — ADULT MULTIVITAMIN W/MINERALS CH
1.0000 | ORAL_TABLET | Freq: Every day | ORAL | Status: DC
  Administered 2023-11-06 – 2023-11-11 (×7): 1 via ORAL
  Filled 2023-11-06 (×6): qty 1

## 2023-11-06 MED ORDER — THIAMINE HCL 100 MG/ML IJ SOLN: 100.0000 mg | Freq: Every day | INTRAMUSCULAR | Status: DC

## 2023-11-06 MED ORDER — DIAZEPAM 5 MG PO TABS
0.0000 mg | ORAL_TABLET | Freq: Four times a day (QID) | ORAL | Status: DC | PRN
  Administered 2023-11-06 (×4): 5 mg via ORAL
  Administered 2023-11-07 (×2): 10 mg via ORAL
  Administered 2023-11-07: 15 mg via ORAL
  Administered 2023-11-07 – 2023-11-08 (×2): 10 mg via ORAL
  Administered 2023-11-09 (×2): 5 mg via ORAL
  Administered 2023-11-10: 10 mg via ORAL
  Administered 2023-11-10 – 2023-11-11 (×3): 5 mg via ORAL
  Filled 2023-11-06: qty 1
  Filled 2023-11-06 (×2): qty 2
  Filled 2023-11-06: qty 3
  Filled 2023-11-06 (×3): qty 1
  Filled 2023-11-06 (×2): qty 2
  Filled 2023-11-06 (×2): qty 1
  Filled 2023-11-06: qty 2
  Filled 2023-11-06: qty 1

## 2023-11-06 MED ORDER — ADULT MULTIVITAMIN W/MINERALS CH: 1.0000 | ORAL_TABLET | Freq: Every day | ORAL | Status: DC

## 2023-11-06 MED ORDER — OXYCODONE HCL 5 MG PO TABS
5.0000 mg | ORAL_TABLET | ORAL | Status: DC | PRN
  Administered 2023-11-06 – 2023-11-08 (×11): 5 mg via ORAL
  Filled 2023-11-06 (×8): qty 1

## 2023-11-06 MED ORDER — SODIUM CHLORIDE 0.9 % IV SOLN
260.0000 mg | Freq: Once | INTRAVENOUS | Status: AC
  Administered 2023-11-06 (×2): 260 mg via INTRAVENOUS
  Filled 2023-11-06: qty 2

## 2023-11-06 MED ORDER — PANTOPRAZOLE SODIUM 40 MG IV SOLR
40.0000 mg | Freq: Once | INTRAVENOUS | Status: AC
  Administered 2023-11-06 (×2): 40 mg via INTRAVENOUS
  Filled 2023-11-06: qty 10

## 2023-11-06 MED ORDER — LORAZEPAM 2 MG/ML IJ SOLN: 1.0000 mg | INTRAMUSCULAR | Status: DC | PRN

## 2023-11-06 MED ORDER — KETOROLAC TROMETHAMINE 15 MG/ML IJ SOLN
15.0000 mg | Freq: Once | INTRAMUSCULAR | Status: AC
  Administered 2023-11-06 (×2): 15 mg via INTRAVENOUS
  Filled 2023-11-06: qty 1

## 2023-11-06 MED ORDER — NICOTINE 14 MG/24HR TD PT24
14.0000 mg | MEDICATED_PATCH | Freq: Every day | TRANSDERMAL | Status: DC
  Administered 2023-11-06 – 2023-11-09 (×5): 14 mg via TRANSDERMAL
  Filled 2023-11-06 (×4): qty 1

## 2023-11-06 MED ORDER — LORAZEPAM 2 MG/ML IJ SOLN: 0.0000 mg | Freq: Two times a day (BID) | INTRAMUSCULAR | Status: DC

## 2023-11-06 MED ORDER — MIDAZOLAM HCL 2 MG/2ML IJ SOLN
1.0000 mg | INTRAMUSCULAR | Status: DC | PRN
  Administered 2023-11-06 – 2023-11-08 (×3): 2 mg via INTRAVENOUS
  Filled 2023-11-06 (×2): qty 2

## 2023-11-06 MED ORDER — THIAMINE HCL 100 MG/ML IJ SOLN
100.0000 mg | Freq: Every day | INTRAMUSCULAR | Status: DC
  Administered 2023-11-07 – 2023-11-08 (×2): 100 mg via INTRAVENOUS
  Filled 2023-11-06 (×4): qty 2

## 2023-11-06 MED ORDER — LORAZEPAM 1 MG PO TABS: 1.0000 mg | ORAL_TABLET | ORAL | Status: DC | PRN

## 2023-11-06 MED ORDER — PANTOPRAZOLE SODIUM 40 MG PO TBEC
40.0000 mg | DELAYED_RELEASE_TABLET | Freq: Every day | ORAL | Status: DC
  Administered 2023-11-06 – 2023-11-11 (×7): 40 mg via ORAL
  Filled 2023-11-06 (×6): qty 1

## 2023-11-06 MED ORDER — THIAMINE MONONITRATE 100 MG PO TABS: 100.0000 mg | ORAL_TABLET | Freq: Every day | ORAL | Status: DC

## 2023-11-06 MED ORDER — ONDANSETRON HCL 4 MG/2ML IJ SOLN: 4.0000 mg | Freq: Four times a day (QID) | INTRAMUSCULAR | Status: DC | PRN

## 2023-11-06 MED ORDER — FOLIC ACID 1 MG PO TABS: 1.0000 mg | ORAL_TABLET | Freq: Every day | ORAL | Status: DC

## 2023-11-06 MED ORDER — LISINOPRIL 5 MG PO TABS
10.0000 mg | ORAL_TABLET | Freq: Every day | ORAL | Status: DC
  Administered 2023-11-06 (×2): 10 mg via ORAL
  Filled 2023-11-06: qty 1

## 2023-11-06 MED ORDER — AMLODIPINE BESYLATE 10 MG PO TABS
10.0000 mg | ORAL_TABLET | Freq: Every day | ORAL | Status: DC
  Administered 2023-11-06 – 2023-11-11 (×7): 10 mg via ORAL
  Filled 2023-11-06: qty 1
  Filled 2023-11-06: qty 2
  Filled 2023-11-06 (×4): qty 1

## 2023-11-06 MED ORDER — LORAZEPAM 2 MG/ML IJ SOLN: 0.0000 mg | Freq: Four times a day (QID) | INTRAMUSCULAR | Status: DC

## 2023-11-06 MED ORDER — FOLIC ACID 1 MG PO TABS
1.0000 mg | ORAL_TABLET | Freq: Every day | ORAL | Status: DC
  Administered 2023-11-06 – 2023-11-11 (×7): 1 mg via ORAL
  Filled 2023-11-06 (×6): qty 1

## 2023-11-06 MED ORDER — PHENOBARBITAL 32.4 MG PO TABS: 32.4000 mg | ORAL_TABLET | Freq: Three times a day (TID) | ORAL | Status: DC

## 2023-11-06 MED ORDER — ONDANSETRON HCL 4 MG/2ML IJ SOLN
4.0000 mg | Freq: Once | INTRAMUSCULAR | Status: AC
  Administered 2023-11-06 (×2): 4 mg via INTRAVENOUS
  Filled 2023-11-06: qty 2

## 2023-11-06 MED ORDER — THIAMINE MONONITRATE 100 MG PO TABS
100.0000 mg | ORAL_TABLET | Freq: Every day | ORAL | Status: DC
  Administered 2023-11-06 (×2): 100 mg via ORAL
  Filled 2023-11-06: qty 1

## 2023-11-06 MED ORDER — ENOXAPARIN SODIUM 40 MG/0.4ML IJ SOSY
40.0000 mg | PREFILLED_SYRINGE | INTRAMUSCULAR | Status: DC
  Administered 2023-11-06 – 2023-11-09 (×5): 40 mg via SUBCUTANEOUS
  Filled 2023-11-06 (×4): qty 0.4

## 2023-11-06 MED ORDER — DIAZEPAM 5 MG/ML IJ SOLN
10.0000 mg | Freq: Once | INTRAMUSCULAR | Status: AC
  Administered 2023-11-06 (×2): 10 mg via INTRAVENOUS
  Filled 2023-11-06: qty 2

## 2023-11-06 MED ORDER — DIAZEPAM 5 MG/ML IJ SOLN
0.0000 mg | Freq: Four times a day (QID) | INTRAMUSCULAR | Status: DC | PRN
  Filled 2023-11-06: qty 2

## 2023-11-06 MED ORDER — HYDROMORPHONE HCL 1 MG/ML IJ SOLN
1.0000 mg | Freq: Once | INTRAMUSCULAR | Status: AC
  Administered 2023-11-06 (×2): 1 mg via INTRAVENOUS
  Filled 2023-11-06: qty 1

## 2023-11-06 MED ORDER — MIDAZOLAM HCL 2 MG/2ML IJ SOLN
2.0000 mg | Freq: Once | INTRAMUSCULAR | Status: AC
  Administered 2023-11-06 (×2): 2 mg via INTRAVENOUS
  Filled 2023-11-06: qty 2

## 2023-11-06 MED ORDER — PHENOBARBITAL 32.4 MG PO TABS
97.2000 mg | ORAL_TABLET | Freq: Three times a day (TID) | ORAL | Status: DC
  Administered 2023-11-06 – 2023-11-07 (×5): 97.2 mg via ORAL
  Filled 2023-11-06 (×3): qty 3

## 2023-11-06 MED ORDER — PHENOBARBITAL 32.4 MG PO TABS: 64.8000 mg | ORAL_TABLET | Freq: Three times a day (TID) | ORAL | Status: DC

## 2023-11-06 MED ORDER — LACTATED RINGERS IV BOLUS
1000.0000 mL | Freq: Once | INTRAVENOUS | Status: AC
  Administered 2023-11-06 (×2): 1000 mL via INTRAVENOUS

## 2023-11-06 MED ORDER — MORPHINE SULFATE (PF) 4 MG/ML IV SOLN
4.0000 mg | Freq: Once | INTRAVENOUS | Status: AC
  Administered 2023-11-06 (×2): 4 mg via INTRAVENOUS
  Filled 2023-11-06: qty 1

## 2023-11-06 MED ORDER — METFORMIN HCL 500 MG PO TABS
1000.0000 mg | ORAL_TABLET | Freq: Two times a day (BID) | ORAL | Status: DC
  Administered 2023-11-06 – 2023-11-11 (×11): 1000 mg via ORAL
  Filled 2023-11-06 (×10): qty 2

## 2023-11-06 NOTE — Discharge Instructions (Signed)

## 2023-11-06 NOTE — Group Note (Deleted)
 Date:  11/06/2023 Time:  2:22 PM  Group Topic/Focus:  Wellness Toolbox:   The focus of this group is to discuss various aspects of wellness, balancing those aspects and exploring ways to increase the ability to experience wellness.  Patients will create a wellness toolbox for use upon discharge.     Participation Level:  {BHH PARTICIPATION OZCZO:77735}  Participation Quality:  {BHH PARTICIPATION QUALITY:22265}  Affect:  {BHH AFFECT:22266}  Cognitive:  {BHH COGNITIVE:22267}  Insight: {BHH Insight2:20797}  Engagement in Group:  {BHH ENGAGEMENT IN HMNLE:77731}  Modes of Intervention:  {BHH MODES OF INTERVENTION:22269}  Additional Comments:  ***  Myra Curtistine BROCKS 11/06/2023, 2:22 PM

## 2023-11-06 NOTE — ED Notes (Signed)
 CCMD notified of patient movement.

## 2023-11-06 NOTE — ED Provider Notes (Signed)
 [place] EMERGENCY DEPARTMENT AT [facility] Provider Note   CSN: [id number] Arrival date & time: 11/06/23  0149     Patient presents with: Delirium Tremens (DTS) and Seizures   Kristopher [name] is a 53 y.o. male.   Patient with a history of hypertension, pancreatitis, alcohol abuse, COPD presents with possible seizure.  States he has had seizures before in the setting of alcohol withdrawal.  His last alcoholic drink was about 2 hours ago.  States he been drinking beer since 4 AM yesterday and continues to drink on a daily basis.  Does not take any seizure medications.  States his wife was driving him to the hospital when she had to pull over because he was having a seizure.  Police officers observed to be having a seizure in the road hitting himself in the face.  Does not think he hit his head.  No tongue biting or incontinence. He has had diffuse abdominal pain that has been ongoing for the past 24 hours similar to previous episodes of pancreatitis.  Several episodes of vomiting. Denies chest pain or shortness of breath.  Denies any other drug use just alcohol use.  No fever, chills, runny nose or sore throat. States he has had seizures in the past from not drinking but never been prescribed any seizure medications.  The history is provided by the patient.  Seizures      Prior to Admission medications   Medication Sig Start Date End Date Taking? Authorizing Provider  albuterol  (VENTOLIN  HFA) 108 (90 Base) MCG/ACT inhaler Inhale 1-2 puffs into the lungs every 6 (six) hours as needed for wheezing or shortness of breath. Patient not taking: Reported on 09/19/2023 06/25/22   Rising, Asberry, PA-C  amLODipine  (NORVASC ) 10 MG tablet Take 1 tablet (10 mg total) by mouth daily. 12/18/22   Mardy Elveria DEL, NP  gabapentin  (NEURONTIN ) 300 MG capsule Take 1 capsule (300 mg total) by mouth 3 (three) times daily. 12/18/22 01/17/23  Mardy Elveria DEL, NP  hydrOXYzine  (ATARAX ) 25 MG  tablet Take 1 tablet (25 mg total) by mouth 3 (three) times daily as needed for anxiety. Patient not taking: Reported on 09/19/2023 12/18/22   Mardy Elveria DEL, NP  lisinopril  (ZESTRIL ) 10 MG tablet Take 10 mg by mouth daily. 09/12/23   [provider]  metFORMIN  (GLUCOPHAGE ) 1000 MG tablet Take 1,000 mg by mouth.  Take 1,000 mg by mouth in the morning and 1,000 mg before bedtime. Patient not taking: Reported on 09/19/2023 03/24/23   [provider]  methocarbamol  (ROBAXIN ) 500 MG tablet Take 500 mg by mouth 3 (three) times daily. Patient not taking: Reported on 09/19/2023 08/30/23   [provider]  nicotine  (NICODERM CQ  - DOSED IN MG/24 HOURS) 14 mg/24hr patch Place 1 patch (14 mg total) onto the skin daily. Patient not taking: Reported on 09/19/2023 12/18/22   Mardy Elveria DEL, NP  omeprazole  (PRILOSEC) 40 MG capsule Take 1 capsule (40 mg total) by mouth daily. 10/13/23   Bradler, Evan K, MD  ondansetron  (ZOFRAN -ODT) 8 MG disintegrating tablet Take 1 tablet (8 mg total) by mouth every 8 (eight) hours as needed for nausea or vomiting. 10/13/23   Bradler, Evan K, MD  oxyCODONE  (OXY IR/ROXICODONE ) 5 MG immediate release tablet Take 1 tablet (5 mg total) by mouth every 6 (six) hours as needed for moderate pain (pain score 4-6) or severe pain (pain score 7-10). 10/13/23   Bradler, Evan K, MD  sucralfate  (CARAFATE ) 1 g tablet  Take 1 tablet (1 g total) by mouth 4 (four) times daily -  with meals and at bedtime. 10/13/23 01/11/24  Bradler, Evan K, MD  traZODone  (DESYREL ) 50 MG tablet Take 1 tablet (50 mg total) by mouth at bedtime as needed for sleep. Patient not taking: Reported on 09/19/2023 12/18/22   Mardy Elveria DEL, NP    Allergies: Ivp dye [iodinated contrast media]    Review of Systems  Constitutional:  Positive for activity change and appetite change. Negative for fever.  HENT:  Negative for congestion and rhinorrhea.   Respiratory:  Negative for cough, chest tightness and  shortness of breath.   Cardiovascular:  Negative for chest pain.  Gastrointestinal:  Positive for abdominal pain, nausea and vomiting. Negative for rectal pain.  Genitourinary:  Negative for dysuria and hematuria.  Musculoskeletal:  Negative for arthralgias and myalgias.  Skin:  Negative for rash.  Neurological:  Positive for seizures.   all other systems are negative except as noted in the HPI and PMH.    Updated Vital Signs BP (!) 152/99   Pulse (!) 111   Temp 98.7 F (37.1 C)   Resp 17   SpO2 97%   Physical Exam Vitals and nursing note reviewed.  Constitutional:      General: He is not in acute distress.    Appearance: He is well-developed. He is obese. He is ill-appearing.  HENT:     Head: Normocephalic and atraumatic.     Mouth/Throat:     Pharynx: No oropharyngeal exudate.  Eyes:     Conjunctiva/sclera: Conjunctivae normal.     Pupils: Pupils are equal, round, and reactive to light.  Neck:     Comments: No meningismus. Cardiovascular:     Rate and Rhythm: Normal rate and regular rhythm.     Heart sounds: Normal heart sounds. No murmur heard. Pulmonary:     Effort: Pulmonary effort is normal. No respiratory distress.     Breath sounds: Normal breath sounds.  Abdominal:     General: There is distension.     Palpations: Abdomen is soft.     Tenderness: There is abdominal tenderness. There is guarding. There is no rebound.     Comments: Abdomen, very tender to palpation.  Voluntary guarding in the epigastrium  Musculoskeletal:        General: No tenderness. Normal range of motion.     Cervical back: Normal range of motion and neck supple.  Skin:    General: Skin is warm.  Neurological:     Mental Status: He is alert and oriented to person, place, and time.     Cranial Nerves: No cranial nerve deficit.     Motor: Weakness present. No abnormal muscle tone.     Coordination: Coordination normal.     Comments: No facial droop.  No tongue fasciculations.  Decreased  grip strength of the right arm compared to left.  He states this has been ongoing for the past 2 weeks Equal strength in lower extremities. Ongoing tremors to arms bilaterally  Psychiatric:        Behavior: Behavior normal.     (all labs ordered are listed, but only abnormal results are displayed) Labs Reviewed  BASIC METABOLIC PANEL WITH GFR - Abnormal; Notable for the following components:      Result Value   Glucose, Bld 149 (*)    All other components within normal limits  CBC - Abnormal; Notable for the following components:   RDW 16.9 (*)  All other components within normal limits  ETHANOL - Abnormal; Notable for the following components:   Alcohol, Ethyl (B) 227 (*)    All other components within normal limits  HEPATIC FUNCTION PANEL - Abnormal; Notable for the following components:   AST 57 (*)    ALT 50 (*)    All other components within normal limits  CBG MONITORING, ED - Abnormal; Notable for the following components:   Glucose-Capillary 132 (*)    All other components within normal limits  LIPASE, BLOOD  AMMONIA  TROPONIN I (HIGH SENSITIVITY)  TROPONIN I (HIGH SENSITIVITY)    EKG: EKG Interpretation Date/Time:  Wednesday November 06 2023 03:18:17 EDT Ventricular Rate:  99 PR Interval:  181 QRS Duration:  109 QT Interval:  345 QTC Calculation: 443 R Axis:   -65  Text Interpretation: Sinus rhythm LAD, consider left anterior fascicular block Abnormal R-wave progression, late transition No significant change was found Confirmed by Carita Senior [phone]) on 11/06/2023 3:22:29 AM  Radiology: CT ABDOMEN PELVIS WO CONTRAST Result Date: 11/06/2023 CLINICAL DATA:  Acute nonlocalized abdominal pain EXAM: CT ABDOMEN AND PELVIS WITHOUT CONTRAST TECHNIQUE: Multidetector CT imaging of the abdomen and pelvis was performed following the standard protocol without IV contrast. RADIATION DOSE REDUCTION: This exam was performed according to the departmental dose-optimization  program which includes automated exposure control, adjustment of the mA and/or kV according to patient size and/or use of iterative reconstruction technique. COMPARISON:  None Available. FINDINGS: Lower chest: No acute abnormality. Hepatobiliary: Moderate hepatic steatosis. No enhancing intrahepatic mass. No intra or extrahepatic biliary ductal dilation. Gallbladder unremarkable. Pancreas: Unremarkable Spleen: Unremarkable Adrenals/Urinary Tract: Adrenal glands are unremarkable. Kidneys are normal, without renal calculi, focal lesion, or hydronephrosis. Bladder is unremarkable. Stomach/Bowel: Mild descending colonic diverticulosis. Stomach, small bowel, and large bowel are otherwise unremarkable. Appendix normal. No evidence of obstruction or focal inflammation. No free intraperitoneal gas or fluid. Vascular/Lymphatic: Aortic atherosclerosis. No enlarged abdominal or pelvic lymph nodes. Reproductive: Prostate is unremarkable. Other: No abdominal wall hernia or abnormality. No abdominopelvic ascites. Musculoskeletal: Multiple healed right rib fractures noted. No acute or significant osseous findings. IMPRESSION: 1. No acute intra-abdominal pathology identified. No definite radiographic explanation for the patient's reported symptoms. 2. Moderate hepatic steatosis. 3. Mild descending colonic diverticulosis. 4. Aortic atherosclerosis. Aortic Atherosclerosis (ICD10-I70.0). Electronically Signed   By: Dorethia Molt M.D.   On: 11/06/2023 03:57   CT Head Wo Contrast Result Date: 11/06/2023 CLINICAL DATA:  Recent seizure activity EXAM: CT HEAD WITHOUT CONTRAST TECHNIQUE: Contiguous axial images were obtained from the base of the skull through the vertex without intravenous contrast. RADIATION DOSE REDUCTION: This exam was performed according to the departmental dose-optimization program which includes automated exposure control, adjustment of the mA and/or kV according to patient size and/or use of iterative  reconstruction technique. COMPARISON:  12/14/2022 FINDINGS: Brain: No evidence of acute infarction, hemorrhage, hydrocephalus, extra-axial collection or mass lesion/mass effect. Vascular: No hyperdense vessel or unexpected calcification. Skull: Normal. Negative for fracture or focal lesion. Sinuses/Orbits: No acute finding. Other: None. IMPRESSION: No acute intracranial abnormality noted. Electronically Signed   By: Oneil Devonshire M.D.   On: 11/06/2023 03:54     .Critical Care  Performed by: Carita Senior, MD Authorized by: Carita Senior, MD   Critical care provider statement:    Critical care time (minutes):  45   Critical care time was exclusive of:  Separately billable procedures and treating other patients   Critical care was necessary to treat or prevent imminent or  life-threatening deterioration of the following conditions: alcohol withdrawal.   Critical care was time spent personally by me on the following activities:  Development of treatment plan with patient or surrogate, discussions with consultants, evaluation of patient's response to treatment, examination of patient, ordering and review of laboratory studies, ordering and review of radiographic studies, ordering and performing treatments and interventions, pulse oximetry, re-evaluation of patient's condition, review of old charts, blood draw for specimens and obtaining history from patient or surrogate   I assumed direction of critical care for this patient from another provider in my specialty: no     Care discussed with: admitting provider      Medications Ordered in the ED  lactated ringers  bolus 1,000 mL (has no administration in time range)  ondansetron  (ZOFRAN ) injection 4 mg (has no administration in time range)  morphine  (PF) 4 MG/ML injection 4 mg (has no administration in time range)  LORazepam  (ATIVAN ) tablet 1-4 mg (has no administration in time range)    Or  LORazepam  (ATIVAN ) injection 1-4 mg (has no  administration in time range)  thiamine  (VITAMIN B1) tablet 100 mg (has no administration in time range)    Or  thiamine  (VITAMIN B1) injection 100 mg (has no administration in time range)  folic acid  (FOLVITE ) tablet 1 mg (has no administration in time range)  multivitamin with minerals tablet 1 tablet (has no administration in time range)  LORazepam  (ATIVAN ) injection 0-4 mg (has no administration in time range)    Followed by  LORazepam  (ATIVAN ) injection 0-4 mg (has no administration in time range)                                    Medical Decision Making Amount and/or Complexity of Data Reviewed Labs: ordered. Decision-making details documented in ED Course. Radiology: ordered and independent interpretation performed. Decision-making details documented in ED Course. ECG/medicine tests: ordered and independent interpretation performed. Decision-making details documented in ED Course.  Risk OTC drugs. Prescription drug management. Decision regarding hospitalization.   Concern for alcohol withdrawal seizure.  He is tachycardic and tremulous on arrival but awake and alert.  Abdomen is tender in the epigastrium.  Will give IV fluids, antiemetics, pain medication.  Initiate CIWA protocol with benzodiazepines, folate and multivitamins.  Patient started phenobarbital  protocol as well as as needed Versed .  Some of his tremor appears to be volitional and improves when distracted.  Ethanol level 227.  No significant leukocytosis Hepatic function and lipase are pending.  CT abdomen pelvis negative for acute findings.  CT head negative for acute findings.  Reports right arm weakness has been ongoing x 2 weeks.  No evidence of acute ischemia on CT scan.  Patient reported to nursing staff that he does feel suicidal.  CT reassuring as above.  Low suspicion for acute CVA given ongoing right arm weakness x 2 weeks.  May require MRI imaging for further evaluation with concern for possible  radiculopathy.  Tachycardia tremors have improved.  Continue treatment for alcohol withdrawal.  No evidence of acute pancreatitis on labs or imaging but states this feels similar.  Pain may be secondary to alcoholic gastritis or esophagitis.  Admission discussed with Dr. Charlton.     Final diagnoses:  None    ED Discharge Orders     None          Tzirel Leonor, Garnette, MD 11/06/23 [phone]

## 2023-11-06 NOTE — ED Notes (Signed)
 Provider to be paged by Diplomatic Services operational officer. No response to secure chat.

## 2023-11-06 NOTE — Progress Notes (Signed)
 CSW added substance abuse resources to patient's AVS.  Niels Portugal, MSW, LCSW Transitions of Care  Clinical Social Worker II [phone]

## 2023-11-06 NOTE — ED Notes (Addendum)
 CIWA updated. Pt has increased tremors and is complaining of generalized abdominal pain. MD notified via secure chat.

## 2023-11-06 NOTE — ED Triage Notes (Signed)
 Pt in with ETOH withdrawls, states he had a seizure PTA - last drink about an hr ago. States cops witnessed him having a seizure tonight and he wanted to drive himself to ED.

## 2023-11-06 NOTE — H&P (Addendum)
 History and Physical    Patient: Kristopher [name] FMW:[id number] DOB: 01-17-1971 DOA: 11/06/2023 DOS: the patient was seen and examined on 11/06/2023 PCP: Patient, No Pcp Per  Patient coming from: Home  Chief Complaint:  Chief Complaint  Patient presents with   Delirium Tremens (DTS)   Seizures   HPI: Kristopher [name] is a 53 y.o. male with medical history significant of alcohol abuse, pancreatitis, COPD, HTN, HLD and GERD p/w acute alcohol withdrawal.  Pt is a limited historian. Majority of history provided from his wife at the bedside, who states that her husband went outside at 0100 (time of last known alcoholic beverage) and called a friend who brought him to the ED. Wife reports that pt drinks two 12 packs of beer or hard lemonade daily. Unable to elucidate history of alcohol withdrawal seizures.  In the ED, pt tachycardic and hypertensive. Labs notable for alcohol level 227 and pt with active tremors c/w withdrawal. CTH head, C-spine, and abdomen w/o acute issues. EDP started phenobarbital  protocol and requested admission.   Review of Systems: As mentioned in the history of present illness. All other systems reviewed and are negative. Past Medical History:  Diagnosis Date   Alcohol dependence (HCC)    COPD (chronic obstructive pulmonary disease) (HCC)    Diabetes mellitus without complication (HCC)    GERD (gastroesophageal reflux disease)    Hypertension    Pancreatitis    Pancreatitis    Past Surgical History:  Procedure Laterality Date   ABDOMINAL SURGERY     ESOPHAGOGASTRODUODENOSCOPY N/A 02/12/2020   Procedure: ESOPHAGOGASTRODUODENOSCOPY (EGD);  Surgeon: Toledo, Ladell MARLA, MD;  Location: [facility] ENDOSCOPY;  Service: Gastroenterology;  Laterality: N/A;   Social History:  reports that he has been smoking cigarettes. He has never used smokeless tobacco. He reports current alcohol use of about 18.0 standard drinks of alcohol per week. He reports that he does not currently  use drugs after having used the following drugs: Marijuana and Cocaine.  Allergies  Allergen Reactions   Ivp Dye [Iodinated Contrast Media] Anaphylaxis    Family History  Problem Relation Age of Onset   Diabetes Other     Prior to Admission medications   Medication Sig Start Date End Date Taking? Authorizing Provider  ondansetron  (ZOFRAN -ODT) 8 MG disintegrating tablet Take 1 tablet (8 mg total) by mouth every 8 (eight) hours as needed for nausea or vomiting. 10/13/23  Yes Bradler, Evan K, MD  oxyCODONE  (OXY IR/ROXICODONE ) 5 MG immediate release tablet Take 1 tablet (5 mg total) by mouth every 6 (six) hours as needed for moderate pain (pain score 4-6) or severe pain (pain score 7-10). 10/13/23  Yes Bradler, Evan K, MD  albuterol  (VENTOLIN  HFA) 108 (90 Base) MCG/ACT inhaler Inhale 1-2 puffs into the lungs every 6 (six) hours as needed for wheezing or shortness of breath. Patient not taking: Reported on 09/19/2023 06/25/22   Rising, Asberry, PA-C  amLODipine  (NORVASC ) 10 MG tablet Take 1 tablet (10 mg total) by mouth daily. Patient not taking: Reported on 11/06/2023 12/18/22   Mardy Elveria DEL, NP  gabapentin  (NEURONTIN ) 300 MG capsule Take 1 capsule (300 mg total) by mouth 3 (three) times daily. Patient not taking: Reported on 11/06/2023 12/18/22 01/17/23  Mardy Elveria DEL, NP  hydrOXYzine  (ATARAX ) 25 MG tablet Take 1 tablet (25 mg total) by mouth 3 (three) times daily as needed for anxiety. Patient not taking: Reported on 09/19/2023 12/18/22   Mardy Elveria DEL, NP  lisinopril  (ZESTRIL ) 10 MG tablet Take  10 mg by mouth daily. Patient not taking: Reported on 11/06/2023 09/12/23   [provider]  metFORMIN  (GLUCOPHAGE ) 1000 MG tablet Take 1,000 mg by mouth.  Take 1,000 mg by mouth in the morning and 1,000 mg before bedtime. Patient not taking: Reported on 09/19/2023 03/24/23   [provider]  methocarbamol  (ROBAXIN ) 500 MG tablet Take 500 mg by mouth 3 (three) times  daily. Patient not taking: Reported on 09/19/2023 08/30/23   [provider]  nicotine  (NICODERM CQ  - DOSED IN MG/24 HOURS) 14 mg/24hr patch Place 1 patch (14 mg total) onto the skin daily. Patient not taking: Reported on 09/19/2023 12/18/22   Mardy Elveria DEL, NP  omeprazole  (PRILOSEC) 40 MG capsule Take 1 capsule (40 mg total) by mouth daily. Patient not taking: Reported on 11/06/2023 10/13/23   Bradler, Evan K, MD  sucralfate  (CARAFATE ) 1 g tablet Take 1 tablet (1 g total) by mouth 4 (four) times daily -  with meals and at bedtime. Patient not taking: Reported on 11/06/2023 10/13/23 01/11/24  Bradler, Evan K, MD  traZODone  (DESYREL ) 50 MG tablet Take 1 tablet (50 mg total) by mouth at bedtime as needed for sleep. Patient not taking: Reported on 09/19/2023 12/18/22   Mardy Elveria DEL, NP    Physical Exam: Vitals:   11/06/23 1115 11/06/23 1136 11/06/23 1139 11/06/23 1140  BP: (!) 148/73 (!) 150/80 (!) 150/80   Pulse: 94  90   Resp: (!) 22     Temp:    (!) 97.5 F (36.4 C)  TempSrc:    Axillary  SpO2: 95%      General: Alert, oriented x3, resting comfortably in no acute distress Respiratory: Lungs clear to auscultation bilaterally with normal respiratory effort; no w/r/r Cardiovascular: Tachycardic; no m/r/g   Data Reviewed:  Lab Results  Component Value Date   WBC 10.1 11/06/2023   HGB 15.2 11/06/2023   HCT 45.7 11/06/2023   MCV 90.0 11/06/2023   PLT 318 11/06/2023   Lab Results  Component Value Date   GLUCOSE 149 (H) 11/06/2023   CALCIUM 9.4 11/06/2023   NA 141 11/06/2023   K 4.0 11/06/2023   CO2 23 11/06/2023   CL 103 11/06/2023   BUN 6 11/06/2023   CREATININE 0.78 11/06/2023   Lab Results  Component Value Date   ALT 50 (H) 11/06/2023   AST 57 (H) 11/06/2023   ALKPHOS 73 11/06/2023   BILITOT 0.4 11/06/2023   Lab Results  Component Value Date   INR 0.9 12/14/2022   INR 1.3 (H) 02/10/2020   INR 0.9 01/24/2019    Radiology: CT Cervical Spine Wo  Contrast Result Date: 11/06/2023 CLINICAL DATA:  53 year old male with recent seizure activity. Right upper extremity weakness, cervical radiculopathy. EXAM: CT CERVICAL SPINE WITHOUT CONTRAST TECHNIQUE: Multidetector CT imaging of the cervical spine was performed without intravenous contrast. Multiplanar CT image reconstructions were also generated. RADIATION DOSE REDUCTION: This exam was performed according to the departmental dose-optimization program which includes automated exposure control, adjustment of the mA and/or kV according to patient size and/or use of iterative reconstruction technique. COMPARISON:  Head CT earlier today. Cervical spine CT 12/14/2022. And cervical spine MRI 12/14/2022. FINDINGS: Alignment: Chronic straightening, mildly increased reversal of cervical lordosis since last year. Cervicothoracic junction alignment is within normal limits. Bilateral posterior element alignment is within normal limits. Skull base and vertebrae: Bone mineralization is within normal limits. Visualized skull base is intact. No atlanto-occipital dissociation. C1 and C2 appear intact and aligned. No  acute osseous abnormality identified. Soft tissues and spinal canal: No prevertebral fluid or swelling. No visible canal hematoma. Negative visible noncontrast neck soft tissues. Disc levels:  C2-C3 and C3-C4 appear negative. C4-C5:  Mild disc space loss.  Mostly anterior endplate spurring. C5-C6: Disc space loss with circumferential disc bulge and endplate spurring which is asymmetric to the right. Evidence of mild to moderate spinal stenosis, and moderate to severe right C6 foraminal stenosis. This level appears stable to the MRI last year. C6-C7: Severe disc space loss. Circumferential disc osteophyte complex which is more symmetric. Mild spinal stenosis, moderate to severe bilateral C7 foraminal stenosis. This level appears stable from the MRI last year. C7-T1:  Negative. Upper chest: Negative visible noncontrast  thoracic inlet. IMPRESSION: 1. No acute osseous abnormality in the cervical spine. 2. 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 Associated spinal stenosis and moderate to severe C6 and C7 nerve level foraminal stenosis which appears stable from MRI 12/14/2022. Electronically Signed   By: VEAR Hurst M.D.   On: 11/06/2023 06:20   CT ABDOMEN PELVIS WO CONTRAST Result Date: 11/06/2023 CLINICAL DATA:  Acute nonlocalized abdominal pain EXAM: CT ABDOMEN AND PELVIS WITHOUT CONTRAST TECHNIQUE: Multidetector CT imaging of the abdomen and pelvis was performed following the standard protocol without IV contrast. RADIATION DOSE REDUCTION: This exam was performed according to the departmental dose-optimization program which includes automated exposure control, adjustment of the mA and/or kV according to patient size and/or use of iterative reconstruction technique. COMPARISON:  None Available. FINDINGS: Lower chest: No acute abnormality. Hepatobiliary: Moderate hepatic steatosis. No enhancing intrahepatic mass. No intra or extrahepatic biliary ductal dilation. Gallbladder unremarkable. Pancreas: Unremarkable Spleen: Unremarkable Adrenals/Urinary Tract: Adrenal glands are unremarkable. Kidneys are normal, without renal calculi, focal lesion, or hydronephrosis. Bladder is unremarkable. Stomach/Bowel: Mild descending colonic diverticulosis. Stomach, small bowel, and large bowel are otherwise unremarkable. Appendix normal. No evidence of obstruction or focal inflammation. No free intraperitoneal gas or fluid. Vascular/Lymphatic: Aortic atherosclerosis. No enlarged abdominal or pelvic lymph nodes. Reproductive: Prostate is unremarkable. Other: No abdominal wall hernia or abnormality. No abdominopelvic ascites. Musculoskeletal: Multiple healed right rib fractures noted. No acute or significant osseous findings. IMPRESSION: 1. No acute intra-abdominal pathology identified. No definite radiographic explanation  for the patient's reported symptoms. 2. Moderate hepatic steatosis. 3. Mild descending colonic diverticulosis. 4. Aortic atherosclerosis. Aortic Atherosclerosis (ICD10-I70.0). Electronically Signed   By: Dorethia Molt M.D.   On: 11/06/2023 03:57   CT Head Wo Contrast Result Date: 11/06/2023 CLINICAL DATA:  Recent seizure activity EXAM: CT HEAD WITHOUT CONTRAST TECHNIQUE: Contiguous axial images were obtained from the base of the skull through the vertex without intravenous contrast. RADIATION DOSE REDUCTION: This exam was performed according to the departmental dose-optimization program which includes automated exposure control, adjustment of the mA and/or kV according to patient size and/or use of iterative reconstruction technique. COMPARISON:  12/14/2022 FINDINGS: Brain: No evidence of acute infarction, hemorrhage, hydrocephalus, extra-axial collection or mass lesion/mass effect. Vascular: No hyperdense vessel or unexpected calcification. Skull: Normal. Negative for fracture or focal lesion. Sinuses/Orbits: No acute finding. Other: None. IMPRESSION: No acute intracranial abnormality noted. Electronically Signed   By: Oneil Devonshire M.D.   On: 11/06/2023 03:54    Assessment and Plan: 55M h/o alcohol abuse, pancreatitis, COPD, HTN, HLD and GERD p/w acute alcohol withdrawal.  Alcohol withdrawal H/o alcohol abuse -Continue PO phenobarbital  taper -CIWA protocol w/ IV/PO diazepam  prn -IV/PO thiamine  per protocol  HTN -PTA amlodipine  and lisinopril   DM2 -PTA  metformin  1000mg  BID -F/u A1c  Nicotice dependence -Nicotine  patch    Advance Care Planning:   Code Status: Prior   Consults: N/A  Family Communication: Wife  Severity of Illness: The appropriate patient status for this patient is INPATIENT. Inpatient status is judged to be reasonable and necessary in order to provide the required intensity of service to ensure the patient's safety. The patient's presenting symptoms, physical exam  findings, and initial radiographic and laboratory data in the context of their chronic comorbidities is felt to place them at high risk for further clinical deterioration. Furthermore, it is not anticipated that the patient will be medically stable for discharge from the hospital within 2 midnights of admission.   * I certify that at the point of admission it is my clinical judgment that the patient will require inpatient hospital care spanning beyond 2 midnights from the point of admission due to high intensity of service, high risk for further deterioration and high frequency of surveillance required.*   ------- I spent 56 minutes reviewing previous notes, at the bedside counseling/discussing the treatment plan, and performing clinical documentation.  Author: Marsha Ada, MD 11/06/2023 1:22 PM  For on call review [email or web address].

## 2023-11-07 ENCOUNTER — Other Ambulatory Visit (HOSPITAL_COMMUNITY): Payer: Self-pay

## 2023-11-07 ENCOUNTER — Telehealth (HOSPITAL_COMMUNITY): Payer: Self-pay | Admitting: Pharmacy Technician

## 2023-11-07 ENCOUNTER — Inpatient Hospital Stay (HOSPITAL_COMMUNITY)

## 2023-11-07 DIAGNOSIS — F10930 Alcohol use, unspecified with withdrawal, uncomplicated: Secondary | ICD-10-CM | POA: Diagnosis not present

## 2023-11-07 DIAGNOSIS — M7989 Other specified soft tissue disorders: Secondary | ICD-10-CM

## 2023-11-07 LAB — CBC WITH DIFFERENTIAL/PLATELET
Abs Immature Granulocytes: 0.03 K/uL (ref 0.00–0.07)
Basophils Absolute: 0.1 K/uL (ref 0.0–0.1)
Basophils Relative: 1 %
Eosinophils Absolute: 0 K/uL (ref 0.0–0.5)
Eosinophils Relative: 1 %
HCT: 40.8 % (ref 39.0–52.0)
Hemoglobin: 13.5 g/dL (ref 13.0–17.0)
Immature Granulocytes: 0 %
Lymphocytes Relative: 31 %
Lymphs Abs: 2.6 K/uL (ref 0.7–4.0)
MCH: 29.9 pg (ref 26.0–34.0)
MCHC: 33.1 g/dL (ref 30.0–36.0)
MCV: 90.3 fL (ref 80.0–100.0)
Monocytes Absolute: 0.4 K/uL (ref 0.1–1.0)
Monocytes Relative: 5 %
Neutro Abs: 5.1 K/uL (ref 1.7–7.7)
Neutrophils Relative %: 62 %
Platelets: 256 K/uL (ref 150–400)
RBC: 4.52 MIL/uL (ref 4.22–5.81)
RDW: 17.2 % — ABNORMAL HIGH (ref 11.5–15.5)
WBC: 8.2 K/uL (ref 4.0–10.5)
nRBC: 0 % (ref 0.0–0.2)

## 2023-11-07 LAB — COMPREHENSIVE METABOLIC PANEL WITH GFR
ALT: 35 U/L (ref 0–44)
AST: 31 U/L (ref 15–41)
Albumin: 3.1 g/dL — ABNORMAL LOW (ref 3.5–5.0)
Alkaline Phosphatase: 62 U/L (ref 38–126)
Anion gap: 7 (ref 5–15)
BUN: 10 mg/dL (ref 6–20)
CO2: 26 mmol/L (ref 22–32)
Calcium: 8.7 mg/dL — ABNORMAL LOW (ref 8.9–10.3)
Chloride: 103 mmol/L (ref 98–111)
Creatinine, Ser: 0.82 mg/dL (ref 0.61–1.24)
GFR, Estimated: >60 mL/min (ref >60)
Glucose, Bld: 147 mg/dL — ABNORMAL HIGH (ref 70–99)
Potassium: 3.7 mmol/L (ref 3.5–5.1)
Sodium: 136 mmol/L (ref 135–145)
Total Bilirubin: 1 mg/dL (ref 0.0–1.2)
Total Protein: 6.3 g/dL — ABNORMAL LOW (ref 6.5–8.1)

## 2023-11-07 LAB — AMMONIA: Ammonia: 49 umol/L — ABNORMAL HIGH (ref 9–35)

## 2023-11-07 LAB — MAGNESIUM: Magnesium: 1.9 mg/dL (ref 1.7–2.4)

## 2023-11-07 MED ORDER — METOPROLOL TARTRATE 25 MG PO TABS
25.0000 mg | ORAL_TABLET | Freq: Two times a day (BID) | ORAL | Status: DC
  Administered 2023-11-07 – 2023-11-08 (×3): 25 mg via ORAL
  Filled 2023-11-07 (×3): qty 1

## 2023-11-07 MED ORDER — PHENOBARBITAL SODIUM 130 MG/ML IJ SOLN
130.0000 mg | Freq: Three times a day (TID) | INTRAMUSCULAR | Status: DC
  Administered 2023-11-07 – 2023-11-08 (×6): 130 mg via INTRAVENOUS
  Filled 2023-11-07 (×7): qty 1

## 2023-11-07 MED ORDER — RIFAXIMIN 550 MG PO TABS
550.0000 mg | ORAL_TABLET | Freq: Two times a day (BID) | ORAL | Status: DC
  Administered 2023-11-07 – 2023-11-11 (×9): 550 mg via ORAL
  Filled 2023-11-07 (×9): qty 1

## 2023-11-07 MED ORDER — MAGNESIUM SULFATE 2 GM/50ML IV SOLN
2.0000 g | Freq: Once | INTRAVENOUS | Status: AC
  Administered 2023-11-07: 2 g via INTRAVENOUS
  Filled 2023-11-07: qty 50

## 2023-11-07 MED ORDER — POTASSIUM CHLORIDE 2 MEQ/ML IV SOLN
INTRAVENOUS | Status: DC
  Filled 2023-11-07 (×3): qty 1000

## 2023-11-07 MED ORDER — CHLORDIAZEPOXIDE HCL 25 MG PO CAPS
25.0000 mg | ORAL_CAPSULE | Freq: Three times a day (TID) | ORAL | Status: DC
  Administered 2023-11-07 – 2023-11-08 (×6): 25 mg via ORAL
  Filled 2023-11-07 (×7): qty 1

## 2023-11-07 MED ORDER — LACTULOSE 10 GM/15ML PO SOLN
30.0000 g | Freq: Two times a day (BID) | ORAL | Status: DC
  Administered 2023-11-07 – 2023-11-09 (×5): 30 g via ORAL
  Filled 2023-11-07 (×6): qty 60

## 2023-11-07 NOTE — Progress Notes (Signed)
 VASCULAR LAB    Right upper extremity venous duplex has been performed.  See CV proc for preliminary results.   Emary Zalar, RVT 11/07/2023, 3:06 PM

## 2023-11-07 NOTE — Telephone Encounter (Signed)
 Pharmacy Patient Advocate Encounter   Received notification from Inpatient Request that prior authorization for Xifaxan  550MG  tablets  is required/requested.   Insurance verification completed.   The patient is insured through [employer] .   Per test claim: PA required; PA submitted to above mentioned insurance via Latent Key/confirmation #/EOC [facility] Status is pending

## 2023-11-07 NOTE — Evaluation (Addendum)
 Physical Therapy Evaluation Patient Details Name: Kristopher [name] MRN: [id number] DOB: [birth year] Today's Date: 11/07/2023  History of Present Illness  Pt is a 53 y/o M admitted on 11/06/23 after presenting with ETOH withdrawal & seizure. PMH: alcohol abuse, pancreatitis, COPD, HTN, HLD, GERD  Clinical Impression  Pt seen for PT evaluation with pt agreeable. Pt reports he has been living in his car & bathing at a truck stop. On this date, pt presents with slightly decreased BUE coordination. Pt ambulates in room, decreased balance 2/2 trying to hold up his pants (no belt in room & pt declined to doff them & wear gown). Pt presents with L knee instability/question some ataxia with gait. Pt is currently a moderate<>high fall risk. Educated pt on recommendation to use RW for mobility at this time. Will continue to follow pt acutely to progress mobility as able.        If plan is discharge home, recommend the following: A little help with bathing/dressing/bathroom;A little help with walking and/or transfers;Assistance with cooking/housework;Assist for transportation;Help with stairs or ramp for entrance   Can travel by private vehicle   Yes    Equipment Recommendations Rolling walker (2 wheels);Wheelchair (measurements PT);Wheelchair cushion (measurements PT)  Recommendations for Other Services    OT consult    Functional Status Assessment Patient has had a recent decline in their functional status and demonstrates the ability to make significant improvements in function in a reasonable and predictable amount of time.     Precautions / Restrictions Precautions Precautions: Fall Restrictions Weight Bearing Restrictions Per Provider Order: No      Mobility  Bed Mobility Overal bed mobility: Needs Assistance Bed Mobility: Supine to Sit   Sidelying to sit: Supervision, HOB elevated, Used rails (extra time to exit L side of bed with HOB elevated, cuing to use bed rails to pull to  sitting)            Transfers Overall transfer level: Needs assistance Equipment used: None Transfers: Sit to/from Stand Sit to Stand: Min assist           General transfer comment: sit>stand from EOB with CGA<>Min assist    Ambulation/Gait Ambulation/Gait assistance: Min assist, +2 safety/equipment Gait Distance (Feet): 15 Feet (+ 20 ft) Assistive device: None Gait Pattern/deviations: Wide base of support, Decreased step length - right, Decreased step length - left, Decreased stride length Gait velocity: decreased     General Gait Details: decreased L knee stability (buckling at times, but unsure if this is weakness or 2/2 ataxia with gait), min assist for ambulation with 2nd person to manage IV pole; pt declined to ambulate in hallway  Stairs            Wheelchair Mobility     Tilt Bed    Modified Rankin (Stroke Patients Only)       Balance Overall balance assessment: Needs assistance Sitting-balance support: Feet supported Sitting balance-Leahy Scale: Fair Sitting balance - Comments: supervision sitting EOB   Standing balance support: No upper extremity supported, During functional activity Standing balance-Leahy Scale: Poor                               Pertinent Vitals/Pain Pain Assessment Pain Assessment: No/denies pain    Home Living Family/patient expects to be discharged to:: Other (Comment) (pt reports he's been living in his car with his girlfriend)  Additional Comments: 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    Prior Function               Mobility Comments: Reports he's ambulatory without AD. ADLs Comments: Bathes at truck stop.     Extremity/Trunk Assessment   Upper Extremity Assessment Upper Extremity Assessment:  (Pt reports he was at [facility] about 2 weeks ago & was told he may have blood clot in RUE; reports decreased sensation distallly & to hand. MD made aware via secure chat.)     Lower Extremity Assessment Lower Extremity Assessment: Generalized weakness       Communication   Communication Communication: No apparent difficulties    Cognition Arousal: Alert Behavior During Therapy: WFL for tasks assessed/performed   PT - Cognitive impairments: Safety/Judgement                       PT - Cognition Comments: AxOx4 Following commands: Intact       Cueing Cueing Techniques: Verbal cues     General Comments General comments (skin integrity, edema, etc.): Pt received with nasal cannula out of nose, pt on room air throughout session & left on room air, SpO2 89-92% -- nurse made aware.    Exercises     Assessment/Plan    PT Assessment Patient needs continued PT services  PT Problem List Decreased coordination;Cardiopulmonary status limiting activity;Decreased range of motion;Decreased activity tolerance;Decreased balance;Decreased mobility;Decreased safety awareness;Decreased knowledge of use of DME;Impaired sensation       PT Treatment Interventions DME instruction;Balance training;Gait training;Neuromuscular re-education;Stair training;Cognitive remediation;Functional mobility training;Therapeutic activities;Therapeutic exercise;Patient/family education    PT Goals (Current goals can be found in the Care Plan section)  Acute Rehab PT Goals Patient Stated Goal: get better PT Goal Formulation: With patient Time For Goal Achievement: 11/21/23 Potential to Achieve Goals: Good    Frequency Min 2X/week     Co-evaluation               AM-PAC PT 6 Clicks Mobility  Outcome Measure Help needed turning from your back to your side while in a flat bed without using bedrails?: A Little Help needed moving from lying on your back to sitting on the side of a flat bed without using bedrails?: A Lot Help needed moving to and from a bed to a chair (including a wheelchair)?: A Little Help needed standing up from a chair using your arms (e.g.,  wheelchair or bedside chair)?: A Little Help needed to walk in hospital room?: A Lot Help needed climbing 3-5 steps with a railing? : A Lot 6 Click Score: 15    End of Session   Activity Tolerance: Patient tolerated treatment well Patient left: in chair;with chair alarm set;with call bell/phone within reach;with family/visitor present Nurse Communication: Mobility status (O2) PT Visit Diagnosis: Unsteadiness on feet (R26.81);Other abnormalities of gait and mobility (R26.89);Difficulty in walking, not elsewhere classified (R26.2)    Time: 8972-8955 PT Time Calculation (min) (ACUTE ONLY): 17 min   Charges:   PT Evaluation $PT Eval Moderate Complexity: 1 Mod   PT General Charges $$ ACUTE PT VISIT: 1 Visit         Richerd Pinal, PT, DPT 11/07/23, 11:18 AM   Richerd CHRISTELLA Pinal 11/07/2023, 11:01 AM

## 2023-11-07 NOTE — Evaluation (Signed)
 Clinical/Bedside Swallow Evaluation Patient Details  Name: Kristopher [name] MRN: [id number] Date of Birth: 03/18/1971  Today's Date: 11/07/2023 Time: SLP Start Time (ACUTE ONLY): 0725 SLP Stop Time (ACUTE ONLY): 0745 SLP Time Calculation (min) (ACUTE ONLY): 20 min  Past Medical History:  Past Medical History:  Diagnosis Date   Alcohol dependence (HCC)    COPD (chronic obstructive pulmonary disease) (HCC)    Diabetes mellitus without complication (HCC)    GERD (gastroesophageal reflux disease)    Hypertension    Pancreatitis    Pancreatitis    Past Surgical History:  Past Surgical History:  Procedure Laterality Date   ABDOMINAL SURGERY     ESOPHAGOGASTRODUODENOSCOPY N/A 02/12/2020   Procedure: ESOPHAGOGASTRODUODENOSCOPY (EGD);  Surgeon: Toledo, Ladell MARLA, MD;  Location: [facility] ENDOSCOPY;  Service: Gastroenterology;  Laterality: N/A;   HPI:  53 y.o. male with medical history significant of alcohol abuse, pancreatitis, COPD, HTN, HLD and GERD p/w acute alcohol withdrawal.  He drinks about 2 cases of 12 pack beer a day, had some interruption in his alcohol intake and went into alcohol withdrawal.    Assessment / Plan / Recommendation  Clinical Impression   Pt presents with a functional oropharyngeal swallow per clinical swallow assessment completed today. Oral prep and transit prompt with complete oral clearance. Pharyngeal swallow initiation appeared timely with laryngeal elevation noted. No overt or subtle s/s of aspiration noted across consistencies. Pt did report a sore throat that started after his seizure activity. He endorsed some odynophagia related to his sore throat, though he is able to physically swallow without concerns for aspiration. No prior hx of dysphagia per chart or pt report.   SLP offered diet modification to a mechanical soft diet in effort to ease odynophagia. Pt reported he is able to select softer items from regular menu. Recommend continue current diet at  tolerated and PO meds as tolerated. No acute SLP needs identified at this time. SLP will sign off. Please re-consult if pt exhibits concerns for aspiration with PO intake.   SLP Visit Diagnosis:  (r/o oropharyngeal dysphagia)    Aspiration Risk  No limitations    Diet Recommendation Regular;Thin liquid    Liquid Administration via: Cup;Straw Medication Administration: Whole meds with liquid Supervision: Patient able to self feed Compensations: Slow rate;Small sips/bites    Other  Recommendations Oral Care Recommendations: Oral care BID     Assistance Recommended at Discharge  None per SLP  Functional Status Assessment Patient has not had a recent decline in their functional status (pertaining to swallowing)  Frequency and Duration  (d/c acute SLP)   (d/c acute SLP)       Prognosis   Good from SLP standpoint     Swallow Study   General Date of Onset: 11/07/23 HPI: 52 y.o. male with medical history significant of alcohol abuse, pancreatitis, COPD, HTN, HLD and GERD p/w acute alcohol withdrawal.  He drinks about 2 cases of 12 pack beer a day, had some interruption in his alcohol intake and went into alcohol withdrawal. Type of Study: Bedside Swallow Evaluation Previous Swallow Assessment: none per chart Diet Prior to this Study: Regular;Thin liquids (Level 0) Temperature Spikes Noted: No Respiratory Status: Nasal cannula History of Recent Intubation: No Behavior/Cognition: Alert;Cooperative;Pleasant mood Oral Cavity Assessment: Within Functional Limits Oral Cavity - Dentition: Edentulous (no dentures) Vision: Functional for self-feeding Self-Feeding Abilities: Able to feed self Patient Positioning: Upright in bed Baseline Vocal Quality: Normal Volitional Cough: Strong Volitional Swallow: Able to elicit    Oral/Motor/Sensory  Function Overall Oral Motor/Sensory Function: Within functional limits   Ice Chips Ice chips: Not tested   Thin Liquid Thin Liquid: Within functional  limits Presentation: Cup;Self Fed    Nectar Thick Nectar Thick Liquid: Not tested   Honey Thick Honey Thick Liquid: Not tested   Puree Puree: Within functional limits Presentation: Self Fed   Solid     Solid: Not tested (pt declined)      Peyton JINNY Rummer 11/07/2023,8:51 AM

## 2023-11-07 NOTE — TOC Initial Note (Addendum)
 Transition of Care [facility]) - Initial/Assessment Note    Patient Details  Name: Kristopher [name] MRN: [id number] Date of Birth: March 05, 1971  Transition [facility] Care [facility]) CM/SW Contact:    Sherline Clack, LCSWA Phone Number: 11/07/2023, 3:10 PM  Clinical Narrative:                  CSW spoke to patient and fiance at bedside with verbal consent. Patient shared he was living in a car with his fiance prior to hospitalization. Patient states he is currently looking for housing and work. CSW offered housing resources, which patient accepted. 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 Patient requested substance use information packet, which CSW provided. CSW presented SNF option to patient, and patient declined, stating he has to get things in order. CSW to follow as needs arise.   Expected Discharge Plan: Home/Self Care Barriers to Discharge: Homeless with medical needs, Continued Medical Work up   Patient Goals and CMS Choice       Patient states their goals for this hospitalization and ongoing recovery are:: To feel better    Expected Discharge Plan and Services       Living arrangements for the past 2 months: No permanent address (Living in car with fiance)                                      Prior Living Arrangements/Services Living arrangements for the past 2 months: No permanent address (Living in car with fiance) Lives with:: Significant Other Patient language and need for interpreter reviewed:: Yes                 Activities of Daily Living      Permission Sought/Granted                  Emotional Assessment Appearance:: Appears stated age Attitude/Demeanor/Rapport: Engaged Affect (typically observed): Appropriate Orientation: : Oriented to Self, Oriented to Place, Oriented to  Time, Oriented to Situation Alcohol / Substance Use: Alcohol Use    Admission  diagnosis:  Alcohol withdrawal (HCC) [F10.939] Epigastric pain [R10.13] Alcohol withdrawal syndrome with perceptual disturbance (HCC) [F10.932] Patient Active Problem List   Diagnosis Date Noted   Alcohol withdrawal (HCC) 11/06/2023   Alcohol use disorder, severe, dependence (HCC) 12/12/2022   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   PCP:  Patient, No Pcp Per Pharmacy:   [id number] [place], [place] - [address] AT [facility] OF [address] [place] [address] Phone: [phone] Fax: [phone]  [facility] - [place], [place] - [address] [place] [place] [address] Phone: [phone] Fax: [phone]  [facility] #[id number] - [place], [place] - [address] AT [facility] OF [address]  [address] [place] [address] Phone: [phone] Fax: [phone]  [facility] [address] [place] [address] [place] [address] Phone: [phone] Fax: [phone]  [facility] [address] [place] - [address] [place] [address] Phone: [phone] Fax: [phone]     Social Drivers of Health (SDOH) Social History: SDOH Screenings   Food Insecurity: No Food Insecurity (12/14/2022)  Housing: Low Risk  (12/14/2022)  Transportation Needs: No Transportation Needs  (12/14/2022)  Utilities: Not At Risk (12/14/2022)  Depression (PHQ2-9): Low Risk  (12/18/2022)  Tobacco Use: High Risk (10/13/2023)   SDOH Interventions:     Readmission Risk Interventions     No data to display

## 2023-11-07 NOTE — Telephone Encounter (Signed)
 Patient Product/process development scientist completed.    The patient is insured through [facility] Patient has [employer], may use a copay card, and/or apply for patient assistance if available.    Ran test claim for Xifaxan  550 mg and Requires Prior Authorization   This test claim was processed through [facility]- copay amounts may vary at other pharmacies due to [employer], or as the patient moves through the different stages of their insurance plan.     Reyes Sharps, CPHT Pharmacy Technician III Certified Patient Advocate [facility] Patient Advocate Team Direct Number: [phone]  Fax: [phone]

## 2023-11-07 NOTE — Progress Notes (Addendum)
 PROGRESS NOTE                                                                                                                                                                                                             Patient Demographics:    Kristopher [name], is a 53 y.o. male, DOB - 1970/04/20, FMW:[id number]  Outpatient Primary MD for the patient is Patient, No Pcp Per    LOS - 1  Admit date - 11/06/2023    Chief Complaint  Patient presents with   Delirium Tremens (DTS)   Seizures       Brief Narrative (HPI from H&P)   53 y.o. male with medical history significant of alcohol abuse, pancreatitis, COPD, HTN, HLD and GERD p/w acute alcohol withdrawal.  He drinks about 2 cases of 12 pack beer a day, had some interruption in his alcohol intake and went into full-blown alcohol withdrawal.  Was brought to the ER by family for the same.   Subjective:    [name] today has, No headache, No chest pain, No abdominal pain - No Nausea, No new weakness tingling or numbness,  no SOB   Assessment  & Plan :    Ongoing heavy alcohol abuse with DTs.  Patient is in full-blown DTs right now despite being on CIWA protocol, continue folic acid  thiamine  supplementation , have added IV phenobarb and high-dose Librium , monitor closely.  IV fluids added as well.   HTN - continue Norvasc , low-dose Lopressor  instead of lisinopril  while he is withdrawing.   DM2 -  metformin  1000mg  BID   Nicotice dependence  - Nicotine  patch, counseled to quit  Obesity.  BMI 38.  Follow-up with PCP  Incidental finding of fatty liver, chronic C-spine changes.  Follow-up with PCP.      Condition -  Guarded  Family Communication  : Wife bedside on 11/07/2023  Code Status : Full code  Consults  :  None  PUD Prophylaxis :  PPI   Procedures  :     CT head, CT C-spine, CT abdomen pelvis.  Nonacute, likely fatty liver, some chronic C-spine  changes.      Disposition Plan  :    Status is: Inpatient   DVT Prophylaxis  :    enoxaparin  (LOVENOX ) injection 40 mg Start: 11/06/23 1815    Lab Results  Component Value Date   PLT 256 11/07/2023    Diet :  Diet Order             Diet regular Room service appropriate? Yes; Fluid consistency: Thin  Diet effective now                    Inpatient Medications  Scheduled Meds:  amLODipine   10 mg Oral Daily   chlordiazePOXIDE   25 mg Oral TID   enoxaparin  (LOVENOX ) injection  40 mg Subcutaneous Q24H   folic acid   1 mg Oral Daily   lactulose   30 g Oral BID   metFORMIN   1,000 mg Oral BID WC   metoprolol  tartrate  25 mg Oral BID   multivitamin with minerals  1 tablet Oral Daily   nicotine   14 mg Transdermal Daily   pantoprazole   40 mg Oral Daily   PHENObarbital   130 mg Intravenous TID   rifaximin   550 mg Oral BID   thiamine   100 mg Intravenous Daily   Continuous Infusions:  lactated ringers  1,000 mL with potassium chloride  20 mEq infusion     magnesium  sulfate bolus IVPB     PRN Meds:.diazepam  **OR** diazepam , menthol -cetylpyridinium, midazolam , ondansetron , oxyCODONE   Antibiotics  :    Anti-infectives (From admission, onward)    Start     Dose/Rate Route Frequency Ordered Stop   11/07/23 1000  rifaximin  (XIFAXAN ) tablet 550 mg        550 mg Oral 2 times daily 11/07/23 0754           Objective:   Vitals:   11/06/23 2000 11/06/23 2200 11/07/23 0000 11/07/23 0449  BP: 125/66  107/66 (!) 99/59  Pulse: 97 89 83   Resp: 18  13   Temp: 98.2 F (36.8 C)  97.7 F (36.5 C) (!) 97.5 F (36.4 C)  TempSrc: Oral  Oral Axillary  SpO2: 94% 93% 94%     Wt Readings from Last 3 Encounters:  09/19/23 124.3 kg  08/27/22 117.9 kg  09/10/21 90.7 kg     Intake/Output Summary (Last 24 hours) at 11/07/2023 0754 Last data filed at 11/06/2023 1900 Gross per 24 hour  Intake --  Output 600 ml  Net -600 ml     Physical Exam  Awake Alert, No new F.N  deficits, in significant DTs with diffuse tremors [place].AT,PERRAL Supple Neck, No JVD,   Symmetrical Chest wall movement, Good air movement bilaterally, CTAB RRR,No Gallops,Rubs or new Murmurs,  +ve B.Sounds, Abd Soft, No tenderness,   No Cyanosis, Clubbing or edema     Data Review:    Recent Labs  Lab 11/06/23 0244 11/06/23 1805 11/07/23 0637  WBC 10.1 9.4 8.2  HGB 15.2 13.8 13.5  HCT 45.7 41.3 40.8  PLT 318 271 256  MCV 90.0 89.2 90.3  MCH 29.9 29.8 29.9  MCHC 33.3 33.4 33.1  RDW 16.9* 17.3* 17.2*  LYMPHSABS  --   --  2.6  MONOABS  --   --  0.4  EOSABS  --   --  0.0  BASOSABS  --   --  0.1    Recent Labs  Lab 11/06/23 0244 11/06/23 0314 11/06/23 1350 11/06/23 1805 11/07/23 0637  NA 141  --   --   --  136  K 4.0  --   --   --  3.7  CL 103  --   --   --  103  CO2 23  --   --   --  26  ANIONGAP 15  --   --   --  7  GLUCOSE 149*  --   --   --  147*  BUN 6  --   --   --  10  CREATININE 0.78  --   --  0.74 0.82  AST  --  57*  --   --  31  ALT  --  50*  --   --  35  ALKPHOS  --  73  --   --  62  BILITOT  --  0.4  --   --  1.0  ALBUMIN  --  3.7  --   --  3.1*  HGBA1C  --   --  6.8*  --   --   AMMONIA  --  32  --   --  49*  MG  --   --   --   --  1.9  CALCIUM 9.4  --   --   --  8.7*      Recent Labs  Lab 11/06/23 0244 11/06/23 0314 11/06/23 1350 11/07/23 0637  HGBA1C  --   --  6.8*  --   AMMONIA  --  32  --  49*  MG  --   --   --  1.9  CALCIUM 9.4  --   --  8.7*    --------------------------------------------------------------------------------------------------------------- Lab Results  Component Value Date   CHOL 184 12/12/2022   HDL 37 (L) 12/12/2022   LDLCALC 97 12/12/2022   TRIG 250 (H) 12/12/2022   CHOLHDL 5.0 12/12/2022    Lab Results  Component Value Date   HGBA1C 6.8 (H) 11/06/2023   No results for input(s): TSH, T4TOTAL, FREET4, T3FREE, THYROIDAB in the last 72 hours. No results for input(s): VITAMINB12, FOLATE,  FERRITIN, TIBC, IRON, RETICCTPCT in the last 72 hours. ------------------------------------------------------------------------------------------------------------------ Cardiac Enzymes No results for input(s): CKMB, TROPONINI, MYOGLOBIN in the last 168 hours.  Invalid input(s): CK  Micro Results No results found for this or any previous visit (from the past 240 hours).  Radiology Report CT Cervical Spine Wo Contrast Result Date: 11/06/2023 CLINICAL DATA:  53 year old male with recent seizure activity. Right upper extremity weakness, cervical radiculopathy. EXAM: CT CERVICAL SPINE WITHOUT CONTRAST TECHNIQUE: Multidetector CT imaging of the cervical spine was performed without intravenous contrast. Multiplanar CT image reconstructions were also generated. RADIATION DOSE REDUCTION: This exam was performed according to the departmental dose-optimization program which includes automated exposure control, adjustment of the mA and/or kV according to patient size and/or use of iterative reconstruction technique. COMPARISON:  Head CT earlier today. Cervical spine CT 12/14/2022. And cervical spine MRI 12/14/2022. FINDINGS: Alignment: Chronic straightening, mildly increased reversal of cervical lordosis since last year. Cervicothoracic junction alignment is within normal limits. Bilateral posterior element alignment is within normal limits. Skull base and vertebrae: Bone mineralization is within normal limits. Visualized skull base is intact. No atlanto-occipital dissociation. C1 and C2 appear intact and aligned. No acute osseous abnormality identified. Soft tissues and spinal canal: No prevertebral fluid or swelling. No visible canal hematoma. Negative visible noncontrast neck soft tissues. Disc levels:  C2-C3 and C3-C4 appear negative. C4-C5:  Mild disc space loss.  Mostly anterior endplate spurring. C5-C6: Disc space loss with circumferential disc bulge and endplate spurring which is asymmetric  to the right. Evidence of mild to moderate spinal stenosis, and moderate to severe right C6 foraminal stenosis. This level appears stable to the MRI last year. C6-C7: Severe disc space loss. Circumferential disc  osteophyte complex which is more symmetric. Mild spinal stenosis, moderate to severe bilateral C7 foraminal stenosis. This level appears stable from the MRI last year. C7-T1:  Negative. Upper chest: Negative visible noncontrast thoracic inlet. IMPRESSION: 1. No acute osseous abnormality in the cervical spine. 2. 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 Associated spinal stenosis and moderate to severe C6 and C7 nerve level foraminal stenosis which appears stable from MRI 12/14/2022. Electronically Signed   By: VEAR Hurst M.D.   On: 11/06/2023 06:20   CT ABDOMEN PELVIS WO CONTRAST Result Date: 11/06/2023 CLINICAL DATA:  Acute nonlocalized abdominal pain EXAM: CT ABDOMEN AND PELVIS WITHOUT CONTRAST TECHNIQUE: Multidetector CT imaging of the abdomen and pelvis was performed following the standard protocol without IV contrast. RADIATION DOSE REDUCTION: This exam was performed according to the departmental dose-optimization program which includes automated exposure control, adjustment of the mA and/or kV according to patient size and/or use of iterative reconstruction technique. COMPARISON:  None Available. FINDINGS: Lower chest: No acute abnormality. Hepatobiliary: Moderate hepatic steatosis. No enhancing intrahepatic mass. No intra or extrahepatic biliary ductal dilation. Gallbladder unremarkable. Pancreas: Unremarkable Spleen: Unremarkable Adrenals/Urinary Tract: Adrenal glands are unremarkable. Kidneys are normal, without renal calculi, focal lesion, or hydronephrosis. Bladder is unremarkable. Stomach/Bowel: Mild descending colonic diverticulosis. Stomach, small bowel, and large bowel are otherwise unremarkable. Appendix normal. No evidence of obstruction or focal inflammation. No  free intraperitoneal gas or fluid. Vascular/Lymphatic: Aortic atherosclerosis. No enlarged abdominal or pelvic lymph nodes. Reproductive: Prostate is unremarkable. Other: No abdominal wall hernia or abnormality. No abdominopelvic ascites. Musculoskeletal: Multiple healed right rib fractures noted. No acute or significant osseous findings. IMPRESSION: 1. No acute intra-abdominal pathology identified. No definite radiographic explanation for the patient's reported symptoms. 2. Moderate hepatic steatosis. 3. Mild descending colonic diverticulosis. 4. Aortic atherosclerosis. Aortic Atherosclerosis (ICD10-I70.0). Electronically Signed   By: Dorethia Molt M.D.   On: 11/06/2023 03:57   CT Head Wo Contrast Result Date: 11/06/2023 CLINICAL DATA:  Recent seizure activity EXAM: CT HEAD WITHOUT CONTRAST TECHNIQUE: Contiguous axial images were obtained from the base of the skull through the vertex without intravenous contrast. RADIATION DOSE REDUCTION: This exam was performed according to the departmental dose-optimization program which includes automated exposure control, adjustment of the mA and/or kV according to patient size and/or use of iterative reconstruction technique. COMPARISON:  12/14/2022 FINDINGS: Brain: No evidence of acute infarction, hemorrhage, hydrocephalus, extra-axial collection or mass lesion/mass effect. Vascular: No hyperdense vessel or unexpected calcification. Skull: Normal. Negative for fracture or focal lesion. Sinuses/Orbits: No acute finding. Other: None. IMPRESSION: No acute intracranial abnormality noted. Electronically Signed   By: Oneil Devonshire M.D.   On: 11/06/2023 03:54     Signature  -   Lavada Stank M.D on 11/07/2023 at 7:54 AM   -  To page go to [email or web address]

## 2023-11-08 DIAGNOSIS — F10930 Alcohol use, unspecified with withdrawal, uncomplicated: Secondary | ICD-10-CM | POA: Diagnosis not present

## 2023-11-08 LAB — CBC WITH DIFFERENTIAL/PLATELET
Abs Immature Granulocytes: 0.03 K/uL (ref 0.00–0.07)
Basophils Absolute: 0.1 K/uL (ref 0.0–0.1)
Basophils Relative: 1 %
Eosinophils Absolute: 0.1 K/uL (ref 0.0–0.5)
Eosinophils Relative: 1 %
HCT: 41.3 % (ref 39.0–52.0)
Hemoglobin: 13.6 g/dL (ref 13.0–17.0)
Immature Granulocytes: 0 %
Lymphocytes Relative: 15 %
Lymphs Abs: 1.5 K/uL (ref 0.7–4.0)
MCH: 29.7 pg (ref 26.0–34.0)
MCHC: 32.9 g/dL (ref 30.0–36.0)
MCV: 90.2 fL (ref 80.0–100.0)
Monocytes Absolute: 0.3 K/uL (ref 0.1–1.0)
Monocytes Relative: 3 %
Neutro Abs: 7.8 K/uL — ABNORMAL HIGH (ref 1.7–7.7)
Neutrophils Relative %: 80 %
Platelets: 205 K/uL (ref 150–400)
RBC: 4.58 MIL/uL (ref 4.22–5.81)
RDW: 16.7 % — ABNORMAL HIGH (ref 11.5–15.5)
WBC: 9.8 K/uL (ref 4.0–10.5)
nRBC: 0 % (ref 0.0–0.2)

## 2023-11-08 LAB — COMPREHENSIVE METABOLIC PANEL WITH GFR
ALT: 36 U/L (ref 0–44)
AST: 37 U/L (ref 15–41)
Albumin: 3.1 g/dL — ABNORMAL LOW (ref 3.5–5.0)
Alkaline Phosphatase: 59 U/L (ref 38–126)
Anion gap: 8 (ref 5–15)
BUN: 11 mg/dL (ref 6–20)
CO2: 24 mmol/L (ref 22–32)
Calcium: 8.8 mg/dL — ABNORMAL LOW (ref 8.9–10.3)
Chloride: 104 mmol/L (ref 98–111)
Creatinine, Ser: 0.74 mg/dL (ref 0.61–1.24)
GFR, Estimated: >60 mL/min (ref >60)
Glucose, Bld: 156 mg/dL — ABNORMAL HIGH (ref 70–99)
Potassium: 3.8 mmol/L (ref 3.5–5.1)
Sodium: 136 mmol/L (ref 135–145)
Total Bilirubin: 0.3 mg/dL (ref 0.0–1.2)
Total Protein: 6.3 g/dL — ABNORMAL LOW (ref 6.5–8.1)

## 2023-11-08 LAB — MAGNESIUM: Magnesium: 1.6 mg/dL — ABNORMAL LOW (ref 1.7–2.4)

## 2023-11-08 LAB — PHOSPHORUS: Phosphorus: 2.9 mg/dL (ref 2.5–4.6)

## 2023-11-08 LAB — AMMONIA: Ammonia: 51 umol/L — ABNORMAL HIGH (ref 9–35)

## 2023-11-08 MED ORDER — HYDRALAZINE HCL 20 MG/ML IJ SOLN: 10.0000 mg | Freq: Four times a day (QID) | INTRAMUSCULAR | Status: DC | PRN

## 2023-11-08 MED ORDER — MAGNESIUM SULFATE 4 GM/100ML IV SOLN
4.0000 g | Freq: Once | INTRAVENOUS | Status: AC
  Administered 2023-11-08: 4 g via INTRAVENOUS
  Filled 2023-11-08: qty 100

## 2023-11-08 MED ORDER — CARVEDILOL 6.25 MG PO TABS
6.2500 mg | ORAL_TABLET | Freq: Two times a day (BID) | ORAL | Status: DC
  Administered 2023-11-08 – 2023-11-11 (×6): 6.25 mg via ORAL
  Filled 2023-11-08 (×7): qty 1

## 2023-11-08 MED ORDER — ISOSORBIDE MONONITRATE ER 30 MG PO TB24
30.0000 mg | ORAL_TABLET | Freq: Every day | ORAL | Status: DC
  Administered 2023-11-08 – 2023-11-11 (×4): 30 mg via ORAL
  Filled 2023-11-08 (×4): qty 1

## 2023-11-08 MED ORDER — OXYCODONE HCL 5 MG PO TABS
5.0000 mg | ORAL_TABLET | Freq: Four times a day (QID) | ORAL | Status: DC | PRN
  Administered 2023-11-08 – 2023-11-09 (×4): 5 mg via ORAL
  Filled 2023-11-08 (×5): qty 1

## 2023-11-08 NOTE — Plan of Care (Signed)

## 2023-11-08 NOTE — Progress Notes (Addendum)
 PROGRESS NOTE                                                                                                                                                                                                             Patient Demographics:    Kristopher [name], is a 53 y.o. male, DOB - [birth year], FMW:[id number]  Outpatient Primary MD for the patient is Patient, No Pcp Per    LOS - 2  Admit date - 11/06/2023    Chief Complaint  Patient presents with   Delirium Tremens (DTS)   Seizures       Brief Narrative (HPI from H&P)   53 y.o. male with medical history significant of alcohol abuse, pancreatitis, COPD, HTN, HLD and GERD p/w acute alcohol withdrawal.  He drinks about 2 cases of 12 pack beer a day, had some interruption in his alcohol intake and went into full-blown alcohol withdrawal.  Was brought to the ER by family for the same.   Subjective:   Patient in bed, appears comfortable, denies any headache, no fever, no chest pain or pressure, no shortness of breath , no abdominal pain. No new focal weakness.   Assessment  & Plan :    Ongoing heavy alcohol abuse with DTs.  Patient is in full-blown DTs right now despite being on CIWA protocol, continue folic acid  thiamine  supplementation , have added IV phenobarb and high-dose Librium , monitor closely.  IV fluids added as well.   HTN - continue Norvasc , added Coreg  and Imdur  for better control, as needed IV hydralazine .   DM2 -  metformin  1000mg  BID   Chronic pain.  On narcotics.  Counseled not to overuse.    Nicotine  dependence  - Nicotine  patch, counseled to quit  Hypomagnesemia.  Replace.    Right arm SVT present on admission.  Patient had been told he might have some blood clots outlying facility prior to admission, confirmed by ultrasound, avoid IVs, supportive care.    Obesity.  BMI 38.  Follow-up with PCP  Incidental finding of fatty liver, chronic C-spine  changes.  Follow-up with PCP.      Condition -  Guarded  Family Communication  : Wife bedside on 11/07/2023 11/08/2023  Code Status : Full code  Consults  :  None  PUD Prophylaxis :  PPI   Procedures  :  CT head, CT C-spine, CT abdomen pelvis.  Nonacute, likely fatty liver, some chronic C-spine changes.      Disposition Plan  :    Status is: Inpatient   DVT Prophylaxis  :    enoxaparin  (LOVENOX ) injection 40 mg Start: 11/06/23 1815    Lab Results  Component Value Date   PLT 205 11/08/2023    Diet :  Diet Order             Diet regular Room service appropriate? Yes; Fluid consistency: Thin  Diet effective now                    Inpatient Medications  Scheduled Meds:  amLODipine   10 mg Oral Daily   carvedilol   6.25 mg Oral BID WC   chlordiazePOXIDE   25 mg Oral TID   enoxaparin  (LOVENOX ) injection  40 mg Subcutaneous Q24H   folic acid   1 mg Oral Daily   isosorbide  mononitrate  30 mg Oral Daily   lactulose   30 g Oral BID   metFORMIN   1,000 mg Oral BID WC   multivitamin with minerals  1 tablet Oral Daily   nicotine   14 mg Transdermal Daily   pantoprazole   40 mg Oral Daily   PHENObarbital   130 mg Intravenous TID   rifaximin   550 mg Oral BID   thiamine   100 mg Intravenous Daily   Continuous Infusions:  magnesium  sulfate bolus IVPB     PRN Meds:.diazepam  **OR** diazepam , hydrALAZINE , menthol -cetylpyridinium, midazolam , ondansetron , oxyCODONE   Antibiotics  :    Anti-infectives (From admission, onward)    Start     Dose/Rate Route Frequency Ordered Stop   11/07/23 1000  rifaximin  (XIFAXAN ) tablet 550 mg        550 mg Oral 2 times daily 11/07/23 0754           Objective:   Vitals:   11/08/23 0400 11/08/23 0810 11/08/23 0941 11/08/23 0955  BP: 103/80 (!) 147/79 (!) 147/79 (!) 150/90  Pulse: 88  88 86  Resp: 18   18  Temp:  99.6 F (37.6 C)    TempSrc:  Oral    SpO2: 90%   92%    Wt Readings from Last 3 Encounters:  09/19/23 124.3  kg  08/27/22 117.9 kg  09/10/21 90.7 kg     Intake/Output Summary (Last 24 hours) at 11/08/2023 1003 Last data filed at 11/08/2023 0400 Gross per 24 hour  Intake 1153.21 ml  Output 600 ml  Net 553.21 ml     Physical Exam  Awake Alert, No new F.N deficits, in significant DTs with diffuse tremors [place].AT,PERRAL Supple Neck, No JVD,   Symmetrical Chest wall movement, Good air movement bilaterally, CTAB RRR,No Gallops,Rubs or new Murmurs,  +ve B.Sounds, Abd Soft, No tenderness,   No Cyanosis, Clubbing or edema     Data Review:    Recent Labs  Lab 11/06/23 0244 11/06/23 1805 11/07/23 0637 11/08/23 0907  WBC 10.1 9.4 8.2 9.8  HGB 15.2 13.8 13.5 13.6  HCT 45.7 41.3 40.8 41.3  PLT 318 271 256 205  MCV 90.0 89.2 90.3 90.2  MCH 29.9 29.8 29.9 29.7  MCHC 33.3 33.4 33.1 32.9  RDW 16.9* 17.3* 17.2* 16.7*  LYMPHSABS  --   --  2.6 1.5  MONOABS  --   --  0.4 0.3  EOSABS  --   --  0.0 0.1  BASOSABS  --   --  0.1 0.1    Recent Labs  Lab 11/06/23 0244 11/06/23 0314 11/06/23 1350 11/06/23 1805 11/07/23 0637 11/08/23 0907 11/08/23 0911  NA 141  --   --   --  136 136  --   K 4.0  --   --   --  3.7 3.8  --   CL 103  --   --   --  103 104  --   CO2 23  --   --   --  26 24  --   ANIONGAP 15  --   --   --  7 8  --   GLUCOSE 149*  --   --   --  147* 156*  --   BUN 6  --   --   --  10 11  --   CREATININE 0.78  --   --  0.74 0.82 0.74  --   AST  --  57*  --   --  31 37  --   ALT  --  50*  --   --  35 36  --   ALKPHOS  --  73  --   --  62 59  --   BILITOT  --  0.4  --   --  1.0 0.3  --   ALBUMIN  --  3.7  --   --  3.1* 3.1*  --   HGBA1C  --   --  6.8*  --   --   --   --   AMMONIA  --  32  --   --  49* 51*  --   MG  --   --   --   --  1.9 1.6*  --   PHOS  --   --   --   --   --   --  2.9  CALCIUM 9.4  --   --   --  8.7* 8.8*  --       Recent Labs  Lab 11/06/23 0244 11/06/23 0314 11/06/23 1350 11/07/23 0637 11/08/23 0907  HGBA1C  --   --  6.8*  --   --   AMMONIA  --   32  --  49* 51*  MG  --   --   --  1.9 1.6*  CALCIUM 9.4  --   --  8.7* 8.8*    --------------------------------------------------------------------------------------------------------------- Lab Results  Component Value Date   CHOL 184 12/12/2022   HDL 37 (L) 12/12/2022   LDLCALC 97 12/12/2022   TRIG 250 (H) 12/12/2022   CHOLHDL 5.0 12/12/2022    Lab Results  Component Value Date   HGBA1C 6.8 (H) 11/06/2023   No results for input(s): TSH, T4TOTAL, FREET4, T3FREE, THYROIDAB in the last 72 hours. No results for input(s): VITAMINB12, FOLATE, FERRITIN, TIBC, IRON, RETICCTPCT in the last 72 hours. ------------------------------------------------------------------------------------------------------------------ Cardiac Enzymes No results for input(s): CKMB, TROPONINI, MYOGLOBIN in the last 168 hours.  Invalid input(s): CK  Micro Results No results found for this or any previous visit (from the past 240 hours).  Radiology Report VAS US  UPPER EXTREMITY VENOUS DUPLEX Result Date: 11/07/2023 UPPER VENOUS STUDY  Patient Name:  BANNER HUCKABA  Date of Exam:   11/07/2023 Medical Rec #: [id number]          Accession #:    [id number] Date of Birth: Dec 30, 1970         Patient Gender: M Patient Age:   80 years Exam Location:  [facility] Procedure:      VAS  US  UPPER EXTREMITY VENOUS DUPLEX Referring Phys: Raidyn Breiner [facility] --------------------------------------------------------------------------------  Indications: Edema, and Numbness of hand, fingers, and forearm Risk Factors: ETOH abuse, withdrawal, pancreatitis. Comparison Study: No prior study on file Performing Technologist: Alberta Lis RVS  Examination Guidelines: A complete evaluation includes B-mode imaging, spectral Doppler, color Doppler, and power Doppler as needed of all accessible portions of each vessel. Bilateral testing is considered an integral part of a complete examination. Limited  examinations for reoccurring indications may be performed as noted.  Right Findings: +----------+------------+---------+-----------+----------+-------+ RIGHT     CompressiblePhasicitySpontaneousPropertiesSummary +----------+------------+---------+-----------+----------+-------+ IJV                      Yes       Yes                      +----------+------------+---------+-----------+----------+-------+ Subclavian               Yes       Yes                      +----------+------------+---------+-----------+----------+-------+ Axillary      Full       Yes       Yes                      +----------+------------+---------+-----------+----------+-------+ Brachial      Full       Yes       Yes                      +----------+------------+---------+-----------+----------+-------+ Radial        Full                                          +----------+------------+---------+-----------+----------+-------+ Ulnar         Full                                          +----------+------------+---------+-----------+----------+-------+ Cephalic    Partial      No        No                Acute  +----------+------------+---------+-----------+----------+-------+ Basilic     Partial      No        No                Acute  +----------+------------+---------+-----------+----------+-------+  Left Findings: +----------+------------+---------+-----------+----------+-------+ LEFT      CompressiblePhasicitySpontaneousPropertiesSummary +----------+------------+---------+-----------+----------+-------+ Subclavian               Yes       Yes                      +----------+------------+---------+-----------+----------+-------+ Arterial and venous flow noted to posterior hand and snuff box  Summary:  Right: No evidence of deep vein thrombosis in the upper extremity. Findings consistent with acute superficial vein thrombosis involving the right basilic vein and  right cephalic vein.  Left: No evidence of thrombosis in the subclavian.  *See table(s) above for measurements and observations.  Diagnosing physician: Gaile New MD Electronically signed by Gaile New MD on 11/07/2023 at 7:13:23 PM.    Final      Signature  -  Lavada Stank M.D on 11/08/2023 at 10:03 AM   -  To page go to [email or web address]

## 2023-11-08 NOTE — Progress Notes (Signed)
 Physical Therapy Treatment Patient Details Name: Kristopher [name] MRN: [id number] DOB: 1970/09/17 Today's Date: 11/08/2023   History of Present Illness Pt is a 53 y/o M admitted on 11/06/23 after presenting with ETOH withdrawal & seizure. PMH: alcohol abuse, pancreatitis, COPD, HTN, HLD, GERD    PT Comments  Patient is agreeable to PT session. Spouse at the bedside. Patient continues to require steadying assistance for ambulation with knee buckling with fatigue. Encouraged patient to monitor for signs of weakness and fatigue, need for rest breaks, and continued use of rolling walker for safety with ambulation and fall prevention. Recommend to continue PT to maximize independence and facilitate return to prior level of function. Anticipate patient may need some caregiver assistance at discharge.    If plan is discharge home, recommend the following: A little help with bathing/dressing/bathroom;A little help with walking and/or transfers;Assistance with cooking/housework;Assist for transportation;Help with stairs or ramp for entrance   Can travel by private vehicle        Equipment Recommendations  Rolling walker (2 wheels)    Recommendations for Other Services       Precautions / Restrictions Precautions Precautions: Fall Recall of Precautions/Restrictions: Impaired Restrictions Weight Bearing Restrictions Per Provider Order: No     Mobility  Bed Mobility Overal bed mobility: Needs Assistance Bed Mobility: Supine to Sit, Sit to Supine     Supine to sit: Supervision, HOB elevated Sit to supine: Supervision, HOB elevated        Transfers Overall transfer level: Needs assistance Equipment used: Rolling walker (2 wheels) Transfers: Sit to/from Stand Sit to Stand: Min assist           General transfer comment: steadying assistance needed. encouraged patient to use rolling walker for safety    Ambulation/Gait Ambulation/Gait assistance: Min assist Gait Distance  (Feet): 32 Feet Assistive device: Rolling walker (2 wheels) Gait Pattern/deviations: Step-through pattern, Decreased stride length Gait velocity: decreased     General Gait Details: patient appears to have bilateral knee buckling with fatigue. he reports this is due to shakiness rather than true weakness. encouraged patient to take frequent rest breaks, and to monitor for need to sit for fall prevention. recommend rolling walker at this time for safety with ambulation efforts   Stairs             Wheelchair Mobility     Tilt Bed    Modified Rankin (Stroke Patients Only)       Balance Overall balance assessment: Needs assistance Sitting-balance support: Feet supported Sitting balance-Leahy Scale: Fair     Standing balance support: Bilateral upper extremity supported Standing balance-Leahy Scale: Poor Standing balance comment: heavy reliance on rolling walker                            Communication Communication Communication: No apparent difficulties  Cognition Arousal: Alert Behavior During Therapy: Impulsive   PT - Cognitive impairments: Safety/Judgement                       PT - Cognition Comments: decreased awareness of need for assistance/rest breaks for fall prevention Following commands: Intact      Cueing Cueing Techniques: Verbal cues  Exercises      General Comments General comments (skin integrity, edema, etc.): Sp02 94% on room air. 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    Prior Function             PT Goals (current goals can now be found in the care plan section) Acute Rehab PT Goals Patient Stated Goal: get better PT Goal Formulation: With patient Time For Goal Achievement: 11/21/23 Potential to Achieve Goals: Good Progress towards PT goals: Progressing toward goals    Frequency    Min 2X/week      PT Plan      Co-evaluation              AM-PAC PT 6 Clicks Mobility   Outcome Measure  Help needed turning from your back to your side while in a flat bed without using bedrails?: A Little Help needed moving from lying on your back to sitting on the side of a flat bed without using bedrails?: A Lot Help needed moving to and from a bed to a chair (including a wheelchair)?: A Little Help needed standing up from a chair using your arms (e.g., wheelchair or bedside chair)?: A Little Help needed to walk in hospital room?: A Lot Help needed climbing 3-5 steps with a railing? : A Lot 6 Click Score: 15    End of Session   Activity Tolerance: Patient tolerated treatment well Patient left: in bed;with call bell/phone within reach;with bed alarm set   PT Visit Diagnosis: Unsteadiness on feet (R26.81);Other abnormalities of gait and mobility (R26.89);Difficulty in walking, not elsewhere classified (R26.2)     Time: 8772-8755 PT Time Calculation (min) (ACUTE ONLY): 17 min  Charges:    $Therapeutic Activity: 8-22 mins PT General Charges $$ ACUTE PT VISIT: 1 Visit                     Randine Essex, PT, MPT    Randine LULLA Essex 11/08/2023, 1:05 PM

## 2023-11-08 NOTE — Evaluation (Signed)
 Occupational Therapy Evaluation Patient Details Name: Kristopher [name] MRN: [id number] DOB: May 23, 1970 Today's Date: 11/08/2023   History of Present Illness   Pt is a 53 y/o M admitted on 11/06/23 after presenting with ETOH withdrawal & seizure. 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 Presents with impulsivity, poor safety awareness and impaired standing balance. He needs min assist and RW to walk to bathroom and demonstrates mild knee buckling with fatigue. He needs set up to moderate assistance for ADLs. Wife in room and notes significant improvement from yesterday. Anticipate continued improvement as he continues to withdraw. May not require post acute OT, depending on progress and length of stay.      If plan is discharge home, recommend the following:   A little help with walking and/or transfers;A lot of help with bathing/dressing/bathroom;Assistance with cooking/housework;Direct supervision/assist for medications management;Direct supervision/assist for financial management;Assist for transportation;Help with stairs or ramp for entrance     Functional Status Assessment   Patient has had a recent decline in their functional status and demonstrates the ability to make significant improvements in function in a reasonable and predictable amount of time.     Equipment Recommendations    (RW)     Recommendations for Other Services         Precautions/Restrictions   Precautions Precautions: Fall Recall of Precautions/Restrictions: Impaired Restrictions Weight Bearing Restrictions Per Provider Order: No     Mobility Bed Mobility Overal bed mobility: Needs Assistance Bed Mobility: Supine to Sit, Sit to Supine     Supine to sit: Supervision Sit to supine: Supervision        Transfers Overall transfer level: Needs  assistance Equipment used: Rolling walker (2 wheels) Transfers: Sit to/from Stand Sit to Stand: Min assist           General transfer comment: steadying assist      Balance Overall balance assessment: Needs assistance Sitting-balance support: Feet supported Sitting balance-Leahy Scale: Fair       Standing balance-Leahy Scale: Poor Standing balance comment: reliant on RW and min assist, legs buckle with fatigue                           ADL either performed or assessed with clinical judgement   ADL Overall ADL's : Needs assistance/impaired Eating/Feeding: Independent;Bed level   Grooming: Wash/dry hands;Wash/dry face;Sitting;Set up   Upper Body Bathing: Minimal assistance;Sitting   Lower Body Bathing: Moderate assistance;Sit to/from stand   Upper Body Dressing : Set up;Sitting   Lower Body Dressing: Moderate assistance;Sit to/from stand   Toilet Transfer: Minimal assistance;Ambulation;Regular Toilet;Rolling walker (2 wheels)   Toileting- Clothing Manipulation and Hygiene: Minimal assistance;Sit to/from stand Toileting - Clothing Manipulation Details (indicate cue type and reason): assist for balance     Functional mobility during ADLs: Minimal assistance;Rolling walker (2 wheels) General ADL Comments: decreased awareness of safety and fall risk     Vision Baseline Vision/History: 1 Wears glasses Ability to See in Adequate Light: 0 Adequate Patient Visual Report: No change from baseline       Perception         Praxis         Pertinent Vitals/Pain Pain Assessment Pain Assessment: No/denies pain     Extremity/Trunk Assessment         Cervical / Trunk Assessment Cervical / Trunk  Assessment: Other exceptions (obesity)   Communication Communication Communication: No apparent difficulties   Cognition Arousal: Alert Behavior During Therapy: Impulsive Cognition: Cognition impaired     Awareness: Intellectual awareness impaired,  Online awareness impaired Memory impairment (select all impairments): Short-term memory Attention impairment (select first level of impairment): Sustained attention Executive functioning impairment (select all impairments): Problem solving, Reasoning                   Following commands: Intact       Cueing  General Comments   Cueing Techniques: Verbal cues      Exercises     Shoulder Instructions      Home Living Family/patient expects to be discharged to:: Shelter/Homeless                                 Additional Comments: 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      Prior Functioning/Environment Prior Level of Function : Independent/Modified Independent                    OT Problem List: Decreased strength;Decreased activity tolerance;Impaired balance (sitting and/or standing);Decreased cognition;Decreased safety awareness;Decreased knowledge of use of DME or AE   OT Treatment/Interventions: Self-care/ADL training;DME and/or AE instruction;Therapeutic activities;Cognitive remediation/compensation;Patient/family education;Balance training      OT Goals(Current goals can be found in the care plan section)   Acute Rehab OT Goals OT Goal Formulation: With patient Time For Goal Achievement: 11/22/23 Potential to Achieve Goals: Good   OT Frequency:  Min 2X/week    Co-evaluation              AM-PAC OT 6 Clicks Daily Activity     Outcome Measure Help from another person eating meals?: None Help from another person taking care of personal grooming?: A Little Help from another person toileting, which includes using toliet, bedpan, or urinal?: A Little Help from another person bathing (including washing, rinsing, drying)?: A Lot Help from another person to put on and taking off regular upper body clothing?: A Little Help from another person to put on and taking off regular lower body clothing?: A Lot 6 Click Score: 17   End of  Session Equipment Utilized During Treatment: Rolling walker (2 wheels);Gait belt Nurse Communication: Mobility status  Activity Tolerance: Patient limited by fatigue Patient left: in bed;with call bell/phone within reach;with bed alarm set;with family/visitor present  OT Visit Diagnosis: Unsteadiness on feet (R26.81);Other abnormalities of gait and mobility (R26.89);Muscle weakness (generalized) (M62.81);Other symptoms and signs involving cognitive function                Time: 1100-1134 OT Time Calculation (min): 34 min Charges:  OT General Charges $OT Visit: 1 Visit OT Evaluation $OT Eval Moderate Complexity: 1 Mod OT Treatments $Self Care/Home Management : 8-22 mins  Mliss HERO, OTR/L Acute Rehabilitation Services Office: [phone]   Kristopher [name] 11/08/2023, 11:43 AM

## 2023-11-09 DIAGNOSIS — F10930 Alcohol use, unspecified with withdrawal, uncomplicated: Secondary | ICD-10-CM | POA: Diagnosis not present

## 2023-11-09 LAB — CBC WITH DIFFERENTIAL/PLATELET
Abs Immature Granulocytes: 0.04 K/uL (ref 0.00–0.07)
Basophils Absolute: 0.1 K/uL (ref 0.0–0.1)
Basophils Relative: 1 %
Eosinophils Absolute: 0.1 K/uL (ref 0.0–0.5)
Eosinophils Relative: 2 %
HCT: 39.6 % (ref 39.0–52.0)
Hemoglobin: 13.3 g/dL (ref 13.0–17.0)
Immature Granulocytes: 1 %
Lymphocytes Relative: 26 %
Lymphs Abs: 2.1 K/uL (ref 0.7–4.0)
MCH: 30.2 pg (ref 26.0–34.0)
MCHC: 33.6 g/dL (ref 30.0–36.0)
MCV: 89.8 fL (ref 80.0–100.0)
Monocytes Absolute: 0.3 K/uL (ref 0.1–1.0)
Monocytes Relative: 3 %
Neutro Abs: 5.7 K/uL (ref 1.7–7.7)
Neutrophils Relative %: 67 %
Platelets: 188 K/uL (ref 150–400)
RBC: 4.41 MIL/uL (ref 4.22–5.81)
RDW: 16.5 % — ABNORMAL HIGH (ref 11.5–15.5)
WBC: 8.3 K/uL (ref 4.0–10.5)
nRBC: 0 % (ref 0.0–0.2)

## 2023-11-09 LAB — AMMONIA: Ammonia: 64 umol/L — ABNORMAL HIGH (ref 9–35)

## 2023-11-09 LAB — PROTIME-INR
INR: 0.9 (ref 0.8–1.2)
Prothrombin Time: 13 s (ref 11.4–15.2)

## 2023-11-09 LAB — COMPREHENSIVE METABOLIC PANEL WITH GFR
ALT: 67 U/L — ABNORMAL HIGH (ref 0–44)
AST: 70 U/L — ABNORMAL HIGH (ref 15–41)
Albumin: 3 g/dL — ABNORMAL LOW (ref 3.5–5.0)
Alkaline Phosphatase: 54 U/L (ref 38–126)
Anion gap: 10 (ref 5–15)
BUN: 9 mg/dL (ref 6–20)
CO2: 21 mmol/L — ABNORMAL LOW (ref 22–32)
Calcium: 8.5 mg/dL — ABNORMAL LOW (ref 8.9–10.3)
Chloride: 104 mmol/L (ref 98–111)
Creatinine, Ser: 0.69 mg/dL (ref 0.61–1.24)
GFR, Estimated: >60 mL/min (ref >60)
Glucose, Bld: 172 mg/dL — ABNORMAL HIGH (ref 70–99)
Potassium: 3.8 mmol/L (ref 3.5–5.1)
Sodium: 135 mmol/L (ref 135–145)
Total Bilirubin: 0.8 mg/dL (ref 0.0–1.2)
Total Protein: 6.3 g/dL — ABNORMAL LOW (ref 6.5–8.1)

## 2023-11-09 LAB — MAGNESIUM: Magnesium: 1.7 mg/dL (ref 1.7–2.4)

## 2023-11-09 MED ORDER — MAGNESIUM SULFATE 4 GM/100ML IV SOLN
4.0000 g | Freq: Once | INTRAVENOUS | Status: AC
  Administered 2023-11-09: 4 g via INTRAVENOUS
  Filled 2023-11-09: qty 100

## 2023-11-09 MED ORDER — THIAMINE MONONITRATE 100 MG PO TABS
100.0000 mg | ORAL_TABLET | Freq: Every day | ORAL | Status: DC
  Administered 2023-11-09 – 2023-11-11 (×3): 100 mg via ORAL
  Filled 2023-11-09 (×3): qty 1

## 2023-11-09 MED ORDER — NICOTINE POLACRILEX 2 MG MT GUM: 2.0000 mg | CHEWING_GUM | OROMUCOSAL | Status: DC | PRN

## 2023-11-09 MED ORDER — CHLORDIAZEPOXIDE HCL 5 MG PO CAPS
15.0000 mg | ORAL_CAPSULE | Freq: Three times a day (TID) | ORAL | Status: DC
  Administered 2023-11-09 (×3): 15 mg via ORAL
  Filled 2023-11-09 (×3): qty 3

## 2023-11-09 MED ORDER — OXYCODONE HCL 5 MG PO TABS
5.0000 mg | ORAL_TABLET | ORAL | Status: DC | PRN
  Administered 2023-11-09 – 2023-11-11 (×11): 5 mg via ORAL
  Filled 2023-11-09 (×11): qty 1

## 2023-11-09 MED ORDER — NICOTINE 21 MG/24HR TD PT24
21.0000 mg | MEDICATED_PATCH | Freq: Every day | TRANSDERMAL | Status: DC
  Administered 2023-11-09 – 2023-11-11 (×3): 21 mg via TRANSDERMAL
  Filled 2023-11-09 (×3): qty 1

## 2023-11-09 MED ORDER — PHENOBARBITAL SODIUM 65 MG/ML IJ SOLN
65.0000 mg | Freq: Three times a day (TID) | INTRAMUSCULAR | Status: DC
  Administered 2023-11-09 (×3): 65 mg via INTRAVENOUS
  Filled 2023-11-09 (×3): qty 1

## 2023-11-09 NOTE — Progress Notes (Signed)
 PROGRESS NOTE                                                                                                                                                                                                             Patient Demographics:    Kristopher [name], is a 53 y.o. male, DOB - [birth year], FMW:[id number]  Outpatient Primary MD for the patient is Patient, No Pcp Per    LOS - 3  Admit date - 11/06/2023    Chief Complaint  Patient presents with   Delirium Tremens (DTS)   Seizures       Brief Narrative (HPI from H&P)   53 y.o. male with medical history significant of alcohol abuse, pancreatitis, COPD, HTN, HLD and GERD p/w acute alcohol withdrawal.  He drinks about 2 cases of 12 pack beer a day, had some interruption in his alcohol intake and went into full-blown alcohol withdrawal.  Was brought to the ER by family for the same.   Subjective:   Patient in bed, appears comfortable, denies any headache, no fever, no chest pain or pressure, no shortness of breath , no abdominal pain. No focal weakness.   Assessment  & Plan :    Ongoing heavy alcohol abuse with DTs.  Patient is in full-blown DTs right now despite being on CIWA protocol, continue folic acid  thiamine  supplementation, DTs have improved start tapering phenobarb and Librium .  Again strictly counseled to abstain from alcohol use and any narcotic abuse.   HTN - continue Norvasc , added Coreg  and Imdur  for better control, as needed IV hydralazine .   DM2 -  metformin  1000 mg BID   Chronic pain.  On narcotics.  Counseled not to overuse.    Nicotine  dependence  - Nicotine  patch, counseled to quit  Hypomagnesemia.  Replace.    Right arm SVT present on admission.  Patient had been told he might have some blood clots outlying facility prior to admission, confirmed by ultrasound, avoid IVs, supportive care.    Obesity.  BMI 38.  Follow-up with PCP  Incidental  finding of fatty liver, chronic C-spine changes.  Follow-up with PCP.      Condition -  Guarded  Family Communication  : Wife bedside on 11/07/2023, 11/08/2023, 11/09/2023  Code Status : Full code  Consults  :  None  PUD Prophylaxis :  PPI  Procedures  :     CT head, CT C-spine, CT abdomen pelvis.  Nonacute, likely fatty liver, some chronic C-spine changes.      Disposition Plan  :    Status is: Inpatient   DVT Prophylaxis  :    enoxaparin  (LOVENOX ) injection 40 mg Start: 11/06/23 1815    Lab Results  Component Value Date   PLT 205 11/08/2023    Diet :  Diet Order             Diet regular Room service appropriate? Yes; Fluid consistency: Thin  Diet effective now                    Inpatient Medications  Scheduled Meds:  amLODipine   10 mg Oral Daily   carvedilol   6.25 mg Oral BID WC   chlordiazePOXIDE   15 mg Oral TID   enoxaparin  (LOVENOX ) injection  40 mg Subcutaneous Q24H   folic acid   1 mg Oral Daily   isosorbide  mononitrate  30 mg Oral Daily   lactulose   30 g Oral BID   metFORMIN   1,000 mg Oral BID WC   multivitamin with minerals  1 tablet Oral Daily   nicotine   14 mg Transdermal Daily   pantoprazole   40 mg Oral Daily   PHENObarbital   65 mg Intravenous TID   rifaximin   550 mg Oral BID   thiamine   100 mg Intravenous Daily   Continuous Infusions:   PRN Meds:.diazepam  **OR** diazepam , hydrALAZINE , menthol -cetylpyridinium, ondansetron , oxyCODONE   Antibiotics  :    Anti-infectives (From admission, onward)    Start     Dose/Rate Route Frequency Ordered Stop   11/07/23 1000  rifaximin  (XIFAXAN ) tablet 550 mg        550 mg Oral 2 times daily 11/07/23 0754           Objective:   Vitals:   11/08/23 2332 11/09/23 0000 11/09/23 0333 11/09/23 0811  BP: (!) 103/52 105/76 106/69 129/75  Pulse: 78 80 73 76  Resp: 15 17 14  (!) 34  Temp: 99.3 F (37.4 C)  98.5 F (36.9 C) 97.6 F (36.4 C)  TempSrc: Oral  Oral Oral  SpO2:  92% 94% 93%     Wt Readings from Last 3 Encounters:  09/19/23 124.3 kg  08/27/22 117.9 kg  09/10/21 90.7 kg     Intake/Output Summary (Last 24 hours) at 11/09/2023 0838 Last data filed at 11/09/2023 0600 Gross per 24 hour  Intake 675.1 ml  Output 1200 ml  Net -524.9 ml     Physical Exam  Awake Alert, No new F.N deficits, in significant DTs with diffuse tremors [name].AT,PERRAL Supple Neck, No JVD,   Symmetrical Chest wall movement, Good air movement bilaterally, CTAB RRR,No Gallops,Rubs or new Murmurs,  +ve B.Sounds, Abd Soft, No tenderness,   No Cyanosis, Clubbing or edema     Data Review:    Recent Labs  Lab 11/06/23 0244 11/06/23 1805 11/07/23 0637 11/08/23 0907  WBC 10.1 9.4 8.2 9.8  HGB 15.2 13.8 13.5 13.6  HCT 45.7 41.3 40.8 41.3  PLT 318 271 256 205  MCV 90.0 89.2 90.3 90.2  MCH 29.9 29.8 29.9 29.7  MCHC 33.3 33.4 33.1 32.9  RDW 16.9* 17.3* 17.2* 16.7*  LYMPHSABS  --   --  2.6 1.5  MONOABS  --   --  0.4 0.3  EOSABS  --   --  0.0 0.1  BASOSABS  --   --  0.1 0.1  Recent Labs  Lab 11/06/23 0244 11/06/23 0314 11/06/23 1350 11/06/23 1805 11/07/23 0637 11/08/23 0907 11/08/23 0911  NA 141  --   --   --  136 136  --   K 4.0  --   --   --  3.7 3.8  --   CL 103  --   --   --  103 104  --   CO2 23  --   --   --  26 24  --   ANIONGAP 15  --   --   --  7 8  --   GLUCOSE 149*  --   --   --  147* 156*  --   BUN 6  --   --   --  10 11  --   CREATININE 0.78  --   --  0.74 0.82 0.74  --   AST  --  57*  --   --  31 37  --   ALT  --  50*  --   --  35 36  --   ALKPHOS  --  73  --   --  62 59  --   BILITOT  --  0.4  --   --  1.0 0.3  --   ALBUMIN  --  3.7  --   --  3.1* 3.1*  --   HGBA1C  --   --  6.8*  --   --   --   --   AMMONIA  --  32  --   --  49* 51*  --   MG  --   --   --   --  1.9 1.6*  --   PHOS  --   --   --   --   --   --  2.9  CALCIUM 9.4  --   --   --  8.7* 8.8*  --       Recent Labs  Lab 11/06/23 0244 11/06/23 0314 11/06/23 1350 11/07/23 0637  11/08/23 0907  HGBA1C  --   --  6.8*  --   --   AMMONIA  --  32  --  49* 51*  MG  --   --   --  1.9 1.6*  CALCIUM 9.4  --   --  8.7* 8.8*    --------------------------------------------------------------------------------------------------------------- Lab Results  Component Value Date   CHOL 184 12/12/2022   HDL 37 (L) 12/12/2022   LDLCALC 97 12/12/2022   TRIG 250 (H) 12/12/2022   CHOLHDL 5.0 12/12/2022    Lab Results  Component Value Date   HGBA1C 6.8 (H) 11/06/2023   No results for input(s): TSH, T4TOTAL, FREET4, T3FREE, THYROIDAB in the last 72 hours. No results for input(s): VITAMINB12, FOLATE, FERRITIN, TIBC, IRON, RETICCTPCT in the last 72 hours. ------------------------------------------------------------------------------------------------------------------ Cardiac Enzymes No results for input(s): CKMB, TROPONINI, MYOGLOBIN in the last 168 hours.  Invalid input(s): CK  Micro Results No results found for this or any previous visit (from the past 240 hours).  Radiology Report VAS US  UPPER EXTREMITY VENOUS DUPLEX Result Date: 11/07/2023 UPPER VENOUS STUDY  Patient Name:  Kristopher [name]  Date of Exam:   11/07/2023 Medical Rec #: [id number]          Accession #:    [id number] Date of Birth: 1970-04-04         Patient Gender: M Patient Age:   35 years Exam Location:  [facility] Procedure:  VAS US  UPPER EXTREMITY VENOUS DUPLEX Referring Phys: [facility] --------------------------------------------------------------------------------  Indications: Edema, and Numbness of hand, fingers, and forearm Risk Factors: ETOH abuse, withdrawal, pancreatitis. Comparison Study: No prior study on file Performing Technologist: Alberta Lis RVS  Examination Guidelines: A complete evaluation includes B-mode imaging, spectral Doppler, color Doppler, and power Doppler as needed of all accessible portions of each vessel. Bilateral testing is  considered an integral part of a complete examination. Limited examinations for reoccurring indications may be performed as noted.  Right Findings: +----------+------------+---------+-----------+----------+-------+ RIGHT     CompressiblePhasicitySpontaneousPropertiesSummary +----------+------------+---------+-----------+----------+-------+ IJV                      Yes       Yes                      +----------+------------+---------+-----------+----------+-------+ Subclavian               Yes       Yes                      +----------+------------+---------+-----------+----------+-------+ Axillary      Full       Yes       Yes                      +----------+------------+---------+-----------+----------+-------+ Brachial      Full       Yes       Yes                      +----------+------------+---------+-----------+----------+-------+ Radial        Full                                          +----------+------------+---------+-----------+----------+-------+ Ulnar         Full                                          +----------+------------+---------+-----------+----------+-------+ Cephalic    Partial      No        No                Acute  +----------+------------+---------+-----------+----------+-------+ Basilic     Partial      No        No                Acute  +----------+------------+---------+-----------+----------+-------+  Left Findings: +----------+------------+---------+-----------+----------+-------+ LEFT      CompressiblePhasicitySpontaneousPropertiesSummary +----------+------------+---------+-----------+----------+-------+ Subclavian               Yes       Yes                      +----------+------------+---------+-----------+----------+-------+ Arterial and venous flow noted to posterior hand and snuff box  Summary:  Right: No evidence of deep vein thrombosis in the upper extremity. Findings consistent with acute  superficial vein thrombosis involving the right basilic vein and right cephalic vein.  Left: No evidence of thrombosis in the subclavian.  *See table(s) above for measurements and observations.  Diagnosing physician: Gaile New MD Electronically signed by Gaile New MD on 11/07/2023 at 7:13:23 PM.    Final  Signature  -   Lavada Stank M.D on 11/09/2023 at 8:38 AM   -  To page go to [email or web address]

## 2023-11-09 NOTE — Progress Notes (Signed)
 PHARMACIST - PHYSICIAN COMMUNICATION  DR:  Dr. Dennise  CONCERNING: IV to Oral Route Change Policy  RECOMMENDATION: This patient is receiving thiamine  by the intravenous route.  Based on criteria approved by the Pharmacy and Therapeutics Committee, the intravenous medication(s) is/are being converted to the equivalent oral dose form(s).   DESCRIPTION: These criteria include: The patient is eating (either orally or via tube) and/or has been taking other orally administered medications for a least 24 hours The patient has no evidence of active gastrointestinal bleeding or impaired GI absorption (gastrectomy, short bowel, patient on TNA or NPO).  If you have questions about this conversion, please contact the Pharmacy Department  []   [phone] )  [facility] []   [phone] )  [facility] [x]   [phone] )  [facility] []   [phone] )  [facility] []   [phone] )  [facility]   Dionicia CROME Duck, [facility] 11/09/2023 8:57 AM

## 2023-11-09 NOTE — Plan of Care (Signed)
  Problem: Clinical Measurements: Goal: Ability to maintain clinical measurements within normal limits will improve Outcome: Progressing   Problem: Activity: Goal: Risk for activity intolerance will decrease Outcome: Progressing   Problem: Nutrition: Goal: Adequate nutrition will be maintained Outcome: Progressing   Problem: Coping: Goal: Level of anxiety will decrease Outcome: Progressing   Problem: Pain Managment: Goal: General experience of comfort will improve and/or be controlled Outcome: Progressing

## 2023-11-09 NOTE — Plan of Care (Signed)

## 2023-11-10 DIAGNOSIS — F10930 Alcohol use, unspecified with withdrawal, uncomplicated: Secondary | ICD-10-CM | POA: Diagnosis not present

## 2023-11-10 LAB — COMPREHENSIVE METABOLIC PANEL WITH GFR
ALT: 52 U/L — ABNORMAL HIGH (ref 0–44)
AST: 39 U/L (ref 15–41)
Albumin: 3 g/dL — ABNORMAL LOW (ref 3.5–5.0)
Alkaline Phosphatase: 50 U/L (ref 38–126)
Anion gap: 11 (ref 5–15)
BUN: 8 mg/dL (ref 6–20)
CO2: 20 mmol/L — ABNORMAL LOW (ref 22–32)
Calcium: 8.4 mg/dL — ABNORMAL LOW (ref 8.9–10.3)
Chloride: 106 mmol/L (ref 98–111)
Creatinine, Ser: 0.76 mg/dL (ref 0.61–1.24)
GFR, Estimated: >60 mL/min (ref >60)
Glucose, Bld: 162 mg/dL — ABNORMAL HIGH (ref 70–99)
Potassium: 4 mmol/L (ref 3.5–5.1)
Sodium: 137 mmol/L (ref 135–145)
Total Bilirubin: 0.5 mg/dL (ref 0.0–1.2)
Total Protein: 6 g/dL — ABNORMAL LOW (ref 6.5–8.1)

## 2023-11-10 LAB — AMMONIA: Ammonia: 61 umol/L — ABNORMAL HIGH (ref 9–35)

## 2023-11-10 LAB — MAGNESIUM: Magnesium: 1.9 mg/dL (ref 1.7–2.4)

## 2023-11-10 MED ORDER — MAGNESIUM SULFATE 2 GM/50ML IV SOLN
2.0000 g | Freq: Once | INTRAVENOUS | Status: AC
  Administered 2023-11-10: 2 g via INTRAVENOUS
  Filled 2023-11-10: qty 50

## 2023-11-10 MED ORDER — PHENOBARBITAL SODIUM 65 MG/ML IJ SOLN
65.0000 mg | Freq: Two times a day (BID) | INTRAMUSCULAR | Status: DC
  Administered 2023-11-10 (×2): 65 mg via INTRAVENOUS
  Filled 2023-11-10 (×2): qty 1

## 2023-11-10 MED ORDER — LACTULOSE 10 GM/15ML PO SOLN
30.0000 g | Freq: Three times a day (TID) | ORAL | Status: DC
  Administered 2023-11-10: 30 g via ORAL
  Filled 2023-11-10 (×4): qty 60

## 2023-11-10 MED ORDER — CHLORDIAZEPOXIDE HCL 5 MG PO CAPS
10.0000 mg | ORAL_CAPSULE | Freq: Three times a day (TID) | ORAL | Status: DC
  Administered 2023-11-10 (×3): 10 mg via ORAL
  Filled 2023-11-10 (×3): qty 2

## 2023-11-10 NOTE — Plan of Care (Signed)

## 2023-11-10 NOTE — Plan of Care (Signed)
  Problem: Clinical Measurements: Goal: Ability to maintain clinical measurements within normal limits will improve Outcome: Progressing   Problem: Activity: Goal: Risk for activity intolerance will decrease Outcome: Progressing   Problem: Nutrition: Goal: Adequate nutrition will be maintained Outcome: Progressing   Problem: Elimination: Goal: Will not experience complications related to bowel motility Outcome: Progressing   Problem: Pain Managment: Goal: General experience of comfort will improve and/or be controlled Outcome: Progressing

## 2023-11-10 NOTE — Progress Notes (Signed)
 PROGRESS NOTE                                                                                                                                                                                                             Patient Demographics:    Kristopher [name], is a 53 y.o. male, DOB - 10-03-1970, FMW:[id number]  Outpatient Primary MD for the patient is Patient, No Pcp Per    LOS - 4  Admit date - 11/06/2023    Chief Complaint  Patient presents with   Delirium Tremens (DTS)   Seizures       Brief Narrative (HPI from H&P)   53 y.o. male with medical history significant of alcohol abuse, pancreatitis, COPD, HTN, HLD and GERD p/w acute alcohol withdrawal.  He drinks about 2 cases of 12 pack beer a day, had some interruption in his alcohol intake and went into full-blown alcohol withdrawal.  Was brought to the ER by family for the same.   Subjective:   Patient in bed, appears comfortable, denies any headache, no fever, no chest pain or pressure, no shortness of breath , no abdominal pain. No new focal weakness.   Assessment  & Plan :    Ongoing heavy alcohol abuse with DTs.  Patient is in full-blown DTs right now despite being on CIWA protocol, continue folic acid  thiamine  supplementation, DTs have improved start tapering phenobarb and Librium .  Again strictly counseled to abstain from alcohol use and any narcotic abuse.   HTN - continue Norvasc , added Coreg  and Imdur  for better control, as needed IV hydralazine .   DM2 -  metformin  1000 mg BID   Chronic pain.  On narcotics.  Counseled not to overuse.    Nicotine  dependence  - Nicotine  patch, counseled to quit  Hypomagnesemia.  Replaced.    Right arm SVT present on admission.  Patient had been told he might have some blood clots outlying facility prior to admission, confirmed by ultrasound, avoid IVs, supportive care.    Obesity.  BMI 38.  Follow-up with  PCP  Incidental finding of fatty liver, chronic C-spine changes.  Follow-up with PCP.      Condition -  Guarded  Family Communication  : Wife bedside on 11/07/2023, 11/08/2023, 11/09/2023  Code Status : Full code  Consults  :  None  PUD Prophylaxis :  PPI   Procedures  :     CT head, CT C-spine, CT abdomen pelvis.  Nonacute, likely fatty liver, some chronic C-spine changes.      Disposition Plan  :    Status is: Inpatient   DVT Prophylaxis  :        Lab Results  Component Value Date   PLT 188 11/09/2023    Diet :  Diet Order             Diet regular Room service appropriate? Yes; Fluid consistency: Thin  Diet effective now                    Inpatient Medications  Scheduled Meds:  amLODipine   10 mg Oral Daily   carvedilol   6.25 mg Oral BID WC   chlordiazePOXIDE   10 mg Oral TID   folic acid   1 mg Oral Daily   isosorbide  mononitrate  30 mg Oral Daily   lactulose   30 g Oral TID   metFORMIN   1,000 mg Oral BID WC   multivitamin with minerals  1 tablet Oral Daily   nicotine   21 mg Transdermal Daily   pantoprazole   40 mg Oral Daily   PHENObarbital   65 mg Intravenous BID   rifaximin   550 mg Oral BID   thiamine   100 mg Oral Daily   Continuous Infusions:  magnesium  sulfate bolus IVPB      PRN Meds:.diazepam  **OR** diazepam , hydrALAZINE , menthol -cetylpyridinium, nicotine  polacrilex, ondansetron , oxyCODONE   Antibiotics  :    Anti-infectives (From admission, onward)    Start     Dose/Rate Route Frequency Ordered Stop   11/07/23 1000  rifaximin  (XIFAXAN ) tablet 550 mg        550 mg Oral 2 times daily 11/07/23 0754           Objective:   Vitals:   11/09/23 1818 11/09/23 1944 11/09/23 2355 11/10/23 0321  BP: 120/65 115/64 118/73 122/73  Pulse: 82 85 83 75  Resp: 20 20 15 12   Temp:  98 F (36.7 C)  98 F (36.7 C)  TempSrc:  Oral  Oral  SpO2: 94% (!) 88% 94% 95%    Wt Readings from Last 3 Encounters:  09/19/23 124.3 kg  08/27/22 117.9 kg   09/10/21 90.7 kg     Intake/Output Summary (Last 24 hours) at 11/10/2023 0817 Last data filed at 11/09/2023 2102 Gross per 24 hour  Intake 98.7 ml  Output 225 ml  Net -126.3 ml     Physical Exam  Awake Alert, No new F.N deficits, in significant DTs with diffuse tremors [place].AT,PERRAL Supple Neck, No JVD,   Symmetrical Chest wall movement, Good air movement bilaterally, CTAB RRR,No Gallops,Rubs or new Murmurs,  +ve B.Sounds, Abd Soft, No tenderness,   No Cyanosis, Clubbing or edema     Data Review:    Recent Labs  Lab 11/06/23 0244 11/06/23 1805 11/07/23 0637 11/08/23 0907 11/09/23 1018  WBC 10.1 9.4 8.2 9.8 8.3  HGB 15.2 13.8 13.5 13.6 13.3  HCT 45.7 41.3 40.8 41.3 39.6  PLT 318 271 256 205 188  MCV 90.0 89.2 90.3 90.2 89.8  MCH 29.9 29.8 29.9 29.7 30.2  MCHC 33.3 33.4 33.1 32.9 33.6  RDW 16.9* 17.3* 17.2* 16.7* 16.5*  LYMPHSABS  --   --  2.6 1.5 2.1  MONOABS  --   --  0.4 0.3 0.3  EOSABS  --   --  0.0 0.1 0.1  BASOSABS  --   --  0.1 0.1 0.1    Recent Labs  Lab 11/06/23 0244 11/06/23 0314 11/06/23 1350 11/06/23 1805 11/07/23 0637 11/08/23 0907 11/08/23 0911 11/09/23 1018 11/10/23 0617  NA 141  --   --   --  136 136  --  135 137  K 4.0  --   --   --  3.7 3.8  --  3.8 4.0  CL 103  --   --   --  103 104  --  104 106  CO2 23  --   --   --  26 24  --  21* 20*  ANIONGAP 15  --   --   --  7 8  --  10 11  GLUCOSE 149*  --   --   --  147* 156*  --  172* 162*  BUN 6  --   --   --  10 11  --  9 8  CREATININE 0.78  --   --  0.74 0.82 0.74  --  0.69 0.76  AST  --  57*  --   --  31 37  --  70* 39  ALT  --  50*  --   --  35 36  --  67* 52*  ALKPHOS  --  73  --   --  62 59  --  54 50  BILITOT  --  0.4  --   --  1.0 0.3  --  0.8 0.5  ALBUMIN  --  3.7  --   --  3.1* 3.1*  --  3.0* 3.0*  INR  --   --   --   --   --   --   --  0.9  --   HGBA1C  --   --  6.8*  --   --   --   --   --   --   AMMONIA  --  32  --   --  49* 51*  --  64* 61*  MG  --   --   --   --  1.9  1.6*  --  1.7 1.9  PHOS  --   --   --   --   --   --  2.9  --   --   CALCIUM 9.4  --   --   --  8.7* 8.8*  --  8.5* 8.4*      Recent Labs  Lab 11/06/23 0244 11/06/23 0314 11/06/23 1350 11/07/23 0637 11/08/23 0907 11/09/23 1018 11/10/23 0617  INR  --   --   --   --   --  0.9  --   HGBA1C  --   --  6.8*  --   --   --   --   AMMONIA  --  32  --  49* 51* 64* 61*  MG  --   --   --  1.9 1.6* 1.7 1.9  CALCIUM 9.4  --   --  8.7* 8.8* 8.5* 8.4*    --------------------------------------------------------------------------------------------------------------- Lab Results  Component Value Date   CHOL 184 12/12/2022   HDL 37 (L) 12/12/2022   LDLCALC 97 12/12/2022   TRIG 250 (H) 12/12/2022   CHOLHDL 5.0 12/12/2022    Lab Results  Component Value Date   HGBA1C 6.8 (H) 11/06/2023   No results for input(s): TSH, T4TOTAL, FREET4, T3FREE, THYROIDAB in the last 72 hours. No results for input(s): VITAMINB12, FOLATE, FERRITIN, TIBC, IRON, RETICCTPCT in the  last 72 hours. ------------------------------------------------------------------------------------------------------------------ Cardiac Enzymes No results for input(s): CKMB, TROPONINI, MYOGLOBIN in the last 168 hours.  Invalid input(s): CK  Micro Results No results found for this or any previous visit (from the past 240 hours).  Radiology Report No results found.    Signature  -   Lavada Stank M.D on 11/10/2023 at 8:17 AM   -  To page go to [email or web address]

## 2023-11-10 NOTE — TOC Progression Note (Signed)
 Transition of Care [facility]) - Progression Note    Patient Details  Name: Kristopher [name] MRN: [id number] Date of Birth: [birth year]  Transition of [facility]) CM/SW Contact  Marval Gell, RN Phone Number: 11/10/2023, 10:03 AM  Clinical Narrative:     [place] w patient and visitor at bedside.  Patient politely declined OP therapies and RW. He states that he has several RWs he can get a family member.   Expected Discharge Plan: Home/Self Care Barriers to Discharge: No Barriers Identified               Expected Discharge Plan and Services       Living arrangements for the past 2 months: No permanent address (Living in car with fiance)                 DME Arranged: N/A (politely declined DME, RW)                     Social Drivers of Health (SDOH) Interventions SDOH Screenings   Food Insecurity: Patient Declined (11/07/2023)  Housing: Patient Declined (11/07/2023)  Transportation Needs: Patient Declined (11/07/2023)  Utilities: Patient Declined (11/07/2023)  Depression (PHQ2-9): Low Risk  (12/18/2022)  Tobacco Use: High Risk (10/13/2023)    Readmission Risk Interventions     No data to display

## 2023-11-11 ENCOUNTER — Other Ambulatory Visit (HOSPITAL_COMMUNITY): Payer: Self-pay

## 2023-11-11 DIAGNOSIS — F10930 Alcohol use, unspecified with withdrawal, uncomplicated: Secondary | ICD-10-CM | POA: Diagnosis not present

## 2023-11-11 MED ORDER — FOLIC ACID 1 MG PO TABS
1.0000 mg | ORAL_TABLET | Freq: Every day | ORAL | 0 refills | Status: AC
  Filled 2023-11-11: qty 30, 30d supply, fill #0

## 2023-11-11 MED ORDER — NICOTINE 14 MG/24HR TD PT24: 14.0000 mg | MEDICATED_PATCH | Freq: Every day | TRANSDERMAL | 0 refills | Status: AC

## 2023-11-11 MED ORDER — LACTULOSE 10 GM/15ML PO SOLN
30.0000 g | Freq: Three times a day (TID) | ORAL | 0 refills | Status: AC
  Filled 2023-11-11: qty 237, 2d supply, fill #0

## 2023-11-11 MED ORDER — THIAMINE HCL 100 MG PO TABS
100.0000 mg | ORAL_TABLET | Freq: Every day | ORAL | 0 refills | Status: AC
  Filled 2023-11-11: qty 30, 30d supply, fill #0

## 2023-11-11 MED ORDER — OMEPRAZOLE 40 MG PO CPDR
40.0000 mg | DELAYED_RELEASE_CAPSULE | Freq: Every day | ORAL | 0 refills | Status: DC
  Filled 2023-11-11: qty 30, 30d supply, fill #0

## 2023-11-11 MED ORDER — CARVEDILOL 6.25 MG PO TABS
6.2500 mg | ORAL_TABLET | Freq: Two times a day (BID) | ORAL | 0 refills | Status: AC
  Filled 2023-11-11: qty 60, 30d supply, fill #0

## 2023-11-11 NOTE — Plan of Care (Signed)
                                      [facility]                            [address] [place], [place] [address]      Kristopher [name] was admitted to the Hospital on 11/06/2023 and Discharged  11/11/2023 , he is stable to go back to work from 11/12/2023.  Call Lavada Stank MD, [facility]  [phone] with questions.  Lavada Stank M.D on 11/11/2023,at 10:31 AM  [facility]   Office  [phone]

## 2023-11-11 NOTE — Progress Notes (Signed)
 AVS, work note and discharge teaching given .  Wife at bedside. Patient expressed verbal understanding of plan of care.   No PIV at discharge.  Transport called to [facility] and emergency exit.

## 2023-11-11 NOTE — Plan of Care (Signed)

## 2023-11-11 NOTE — Progress Notes (Signed)
 Physical Therapy Treatment Patient Details Name: Kristopher [name] MRN: [id number] DOB: [birth year] Today's Date: 11/11/2023   History of Present Illness Pt is a 53 y/o M admitted on 11/06/23 after presenting with ETOH withdrawal & seizure. PMH: alcohol abuse, pancreatitis, COPD, HTN, HLD, GERD    PT Comments  Pt tolerated treatment well today. Pt today was able to ambulate in hallway independently and navigate stairs. DC recs updated to OPPT to which pt states he has an appointment tomorrow. Pt eager to for DC today.    If plan is discharge home, recommend the following: A little help with bathing/dressing/bathroom;A little help with walking and/or transfers;Assistance with cooking/housework;Assist for transportation;Help with stairs or ramp for entrance   Can travel by private vehicle     Yes  Equipment Recommendations  Rolling walker (2 wheels)    Recommendations for Other Services       Precautions / Restrictions Precautions Precautions: Fall Restrictions Weight Bearing Restrictions Per Provider Order: No     Mobility  Bed Mobility               General bed mobility comments: Seated EOB    Transfers Overall transfer level: Modified independent Equipment used: None Transfers: Sit to/from Stand Sit to Stand: Independent                Ambulation/Gait Ambulation/Gait assistance: Independent Gait Distance (Feet): 200 Feet Assistive device: None Gait Pattern/deviations: Step-through pattern, Decreased stride length Gait velocity: decreased     General Gait Details: no LOB noted. Pt reports feeling a bit wobbly however most likely due to being distracted in hallway.   Stairs Stairs: Yes Stairs assistance: Independent Stair Management: Two rails Number of Stairs: 10 General stair comments: no LOB noted.   Wheelchair Mobility     Tilt Bed    Modified Rankin (Stroke Patients Only)       Balance Overall balance assessment: No apparent  balance deficits (not formally assessed)                                          Communication Communication Communication: No apparent difficulties  Cognition Arousal: Alert Behavior During Therapy: WFL for tasks assessed/performed, Restless                             Following commands: Intact      Cueing Cueing Techniques: Verbal cues  Exercises      General Comments General comments (skin integrity, edema, etc.): VSS on RA      Pertinent Vitals/Pain Pain Assessment Pain Assessment: No/denies pain    Home Living                          Prior Function            PT Goals (current goals can now be found in the care plan section) Progress towards PT goals: Progressing toward goals    Frequency    Min 2X/week      PT Plan      Co-evaluation              AM-PAC PT 6 Clicks Mobility   Outcome Measure  Help needed turning from your back to your side while in a flat bed without using bedrails?: None Help needed moving from lying on your  back to sitting on the side of a flat bed without using bedrails?: None Help needed moving to and from a bed to a chair (including a wheelchair)?: None Help needed standing up from a chair using your arms (e.g., wheelchair or bedside chair)?: None Help needed to walk in hospital room?: None Help needed climbing 3-5 steps with a railing? : None 6 Click Score: 24    End of Session Equipment Utilized During Treatment: Gait belt Activity Tolerance: Patient tolerated treatment well Patient left: in bed;with call bell/phone within reach;with family/visitor present Nurse Communication: Mobility status PT Visit Diagnosis: Unsteadiness on feet (R26.81);Other abnormalities of gait and mobility (R26.89);Difficulty in walking, not elsewhere classified (R26.2)     Time: 9046-8994 PT Time Calculation (min) (ACUTE ONLY): 12 min  Charges:    $Gait Training: 8-22 mins PT General  Charges $$ ACUTE PT VISIT: 1 Visit                     Sueellen NOVAK, PT, DPT Acute Rehab Services [phone]    Kaydie Petsch 11/11/2023, 11:38 AM

## 2023-11-11 NOTE — Telephone Encounter (Signed)
 Pharmacy Patient Advocate Encounter  Received notification from [facility] that Prior Authorization for Xifaxan  550 mg tablets has been APPROVED from 11/09/2024 to 05/08/2024. Ran test claim, Copay is $0.00. This test claim was processed through [facility]- copay amounts may vary at other pharmacies due to pharmacy/plan contracts, or as the patient moves through the different stages of their insurance plan.   PA #/Case ID/Reference #: [id number]

## 2023-11-11 NOTE — Progress Notes (Signed)
 Occupational Therapy Treatment Patient Details Name: Kristopher [name] MRN: [id number] DOB: 11/12/1970 Today's Date: 11/11/2023   History of present illness Pt is a 53 y/o M admitted on 11/06/23 after presenting with ETOH withdrawal & seizure. PMH: alcohol abuse, pancreatitis, COPD, HTN, HLD, GERD   OT comments  Pt making good progress towards OT goals; able to manage mobility and simulated ADLs in room without assistance and without AD. Pt somewhat limited in engagement d/t reported recent negative interaction with MD. 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 Will continue to follow acutely though no OT follow up needed upon DC.      If plan is discharge home, recommend the following:  Assist for transportation (d/t recent seizure)   Equipment Recommendations  None recommended by OT    Recommendations for Other Services      Precautions / Restrictions Precautions Precautions: Fall Restrictions Weight Bearing Restrictions Per Provider Order: No       Mobility Bed Mobility Overal bed mobility: Modified Independent                  Transfers Overall transfer level: Modified independent Equipment used: None Transfers: Sit to/from Stand Sit to Stand: Independent                 Balance Overall balance assessment: No apparent balance deficits (not formally assessed)                                         ADL either performed or assessed with clinical judgement   ADL Overall ADL's : Needs assistance/impaired                           Toilet Transfer Details (indicate cue type and reason): reports he has been going to  bathroom independently w/o use of RW         Functional mobility during ADLs: Supervision/safety General ADL Comments: Able to mobilize room without AD and without LOB; able to demo simulations of ADLs (declined to engage in ADLs this AM),  reaching overhead and down to ground without LOB. Able to assist in tidying up bed without issues. Distracted by his reported negative interaction with doctor    Extremity/Trunk Assessment Upper Extremity Assessment Upper Extremity Assessment: Overall WFL for tasks assessed;Right hand dominant   Lower Extremity Assessment Lower Extremity Assessment: Defer to PT evaluation        Vision   Vision Assessment?: No apparent visual deficits   Perception     Praxis     Communication Communication Communication: No apparent difficulties   Cognition Arousal: Alert Behavior During Therapy: WFL for tasks assessed/performed, Restless Cognition: No family/caregiver present to determine baseline     Awareness: Intellectual awareness intact   Attention impairment (select first level of impairment): Selective attention, Alternating attention Executive functioning impairment (select all impairments): Reasoning OT - Cognition Comments: restless but more so due to perseveration on negative interaction with doctor. Improving functional and cognitive abilities, shows insight into prior balance deficits and current improvements. 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                 Following commands: Intact        Cueing   Cueing Techniques:  Verbal cues  Exercises      Shoulder Instructions       General Comments VSS on RA    Pertinent Vitals/ Pain       Pain Assessment Pain Assessment: No/denies pain  Home Living                                          Prior Functioning/Environment              Frequency  Min 2X/week        Progress Toward Goals  OT Goals(current goals can now be found in the care plan section)  Progress towards OT goals: Progressing toward goals  Acute Rehab OT Goals OT Goal Formulation: With patient Time For Goal Achievement:  11/22/23 Potential to Achieve Goals: Good ADL Goals Pt Will Perform Grooming: with modified independence;standing Pt Will Perform Lower Body Bathing: with modified independence;sit to/from stand Pt Will Perform Lower Body Dressing: with modified independence;sit to/from stand Pt Will Transfer to Toilet: with modified independence;ambulating;regular height toilet Pt Will Perform Toileting - Clothing Manipulation and hygiene: with modified independence;sit to/from stand  Plan      Co-evaluation                 AM-PAC OT 6 Clicks Daily Activity     Outcome Measure   Help from another person eating meals?: None Help from another person taking care of personal grooming?: None Help from another person toileting, which includes using toliet, bedpan, or urinal?: None Help from another person bathing (including washing, rinsing, drying)?: A Little Help from another person to put on and taking off regular upper body clothing?: None Help from another person to put on and taking off regular lower body clothing?: A Little 6 Click Score: 22    End of Session    OT Visit Diagnosis: Unsteadiness on feet (R26.81);Other abnormalities of gait and mobility (R26.89);Muscle weakness (generalized) (M62.81);Other symptoms and signs involving cognitive function   Activity Tolerance Patient tolerated treatment well   Patient Left in bed;with call bell/phone within reach   Nurse Communication Mobility status        Time: 9244-9188 OT Time Calculation (min): 16 min  Charges: OT General Charges $OT Visit: 1 Visit OT Treatments $Therapeutic Activity: 8-22 mins  Mliss NOVAK, OTR/L Acute Rehab Services Office: [phone]   Mliss Fish 11/11/2023, 8:20 AM

## 2023-11-11 NOTE — Discharge Summary (Signed)
 MCCRAE SPECIALE FMW:[id number] DOB: 07/08/1970 DOA: 11/06/2023  PCP: Patient, No Pcp Per  Admit date: 11/06/2023  Discharge date: 11/11/2023  Admitted From: Home   Disposition:  Home   Recommendations for Outpatient Follow-up:   Follow up with PCP in 1-2 weeks  PCP Please obtain BMP/CBC, 2 view CXR in 1week,  (see Discharge instructions)   PCP Please follow up on the following pending results:    Home Health: None   Equipment/Devices: None  Consultations: None  Discharge Condition: Stable    CODE STATUS: Full    Diet Recommendation: Heart Healthy     Chief Complaint  Patient presents with   Delirium Tremens (DTS)   Seizures     Brief history of present illness from the day of admission and additional interim summary    53 y.o. male with medical history significant of alcohol abuse, pancreatitis, COPD, HTN, HLD and GERD p/w acute alcohol withdrawal.  He drinks about 2 cases of 12 pack beer a day, had some interruption in his alcohol intake and went into full-blown alcohol withdrawal.  Was brought to the ER by family for the same.                                                                  Hospital Course   Ongoing heavy alcohol abuse with DTs.  He was in severe DTs was placed on scheduled Librium , phenobarb along with CIWA protocol, DTs have now completely resolved, he has been strictly counseled to abstain from alcohol, he promises that he would not indulge in alcohol again, PCP to monitor.   HTN -blood pressure regimen adjusted blood pressure stable PCP to monitor and adjust.   DM2 -  metformin  1000 mg BID   Chronic pain.  On narcotics.  Counseled not to overuse.  Patient could not tell me where he is getting his narcotics refilled from, requested him not to use any street Pat drugs.   Nicotine   dependence  - Nicotine  patch, counseled to quit   Hypomagnesemia.  Replaced.     Right arm SVT present on admission.  Patient had been told he might have some blood clots outlying facility prior to admission, confirmed by ultrasound, improved with supportive care.   Obesity.  BMI 38.  Follow-up with PCP   Incidental finding of fatty liver, chronic C-spine changes.  Follow-up with PCP.    Discharge diagnosis     Principal Problem:   Alcohol withdrawal (HCC)    Discharge instructions    Discharge Instructions     Diet - low sodium heart healthy   Complete by: As directed    Discharge instructions   Complete by: As directed    Follow with Primary MD in 7 days   Get CBC, CMP, Magnesium , 2 view Chest  X ray -  checked next visit with your primary MD    Activity: As tolerated with Full fall precautions use walker/cane & assistance as needed  Disposition Home   Diet: Heart Healthy    Special Instructions: If you have smoked or chewed Tobacco  in the last 2 yrs please stop smoking, stop any regular Alcohol  and or any Recreational drug use.  On your next visit with your primary care physician please Get Medicines reviewed and adjusted.  Please request your Prim.MD to go over all Hospital Tests and Procedure/Radiological results at the follow up, please get all Hospital records sent to your Prim MD by signing hospital release before you go home.  If you experience worsening of your admission symptoms, develop shortness of breath, life threatening emergency, suicidal or homicidal thoughts you must seek medical attention immediately by calling 911 or calling your MD immediately  if symptoms less severe.  You Must read complete instructions/literature along with all the possible adverse reactions/side effects for all the Medicines you take and that have been prescribed to you. Take any new Medicines after you have completely understood and accpet all the possible adverse  reactions/side effects.   Do not drive when taking Pain medications.  Do not take more than prescribed Pain, Sleep and Anxiety Medications  Wear Seat belts while driving.   Increase activity slowly   Complete by: As directed        Discharge Medications   Allergies as of 11/11/2023       Reactions   Ivp Dye [iodinated Contrast Media] Anaphylaxis        Medication List     STOP taking these medications    amLODipine  10 MG tablet Commonly known as: NORVASC    ondansetron  8 MG disintegrating tablet Commonly known as: ZOFRAN -ODT   sucralfate  1 g tablet Commonly known as: Carafate    traZODone  50 MG tablet Commonly known as: DESYREL        TAKE these medications    albuterol  108 (90 Base) MCG/ACT inhaler Commonly known as: VENTOLIN  HFA Inhale 1-2 puffs into the lungs every 6 (six) hours as needed for wheezing or shortness of breath.   carvedilol  6.25 MG tablet Commonly known as: COREG  Take 1 tablet (6.25 mg total) by mouth 2 (two) times daily with a meal.   folic acid  1 MG tablet Commonly known as: FOLVITE  Take 1 tablet (1 mg total) by mouth daily.   gabapentin  300 MG capsule Commonly known as: NEURONTIN  Take 1 capsule (300 mg total) by mouth 3 (three) times daily.   hydrOXYzine  25 MG tablet Commonly known as: ATARAX  Take 1 tablet (25 mg total) by mouth 3 (three) times daily as needed for anxiety.   lactulose  10 GM/15ML solution Commonly known as: CHRONULAC  Take 45 mLs (30 g total) by mouth 3 (three) times daily.   lisinopril  10 MG tablet Commonly known as: ZESTRIL  Take 10 mg by mouth daily.   metFORMIN  1000 MG tablet Commonly known as: GLUCOPHAGE  Take 1,000 mg by mouth.  Take 1,000 mg by mouth in the morning and 1,000 mg before bedtime.   methocarbamol  500 MG tablet Commonly known as: ROBAXIN  Take 500 mg by mouth 3 (three) times daily.   nicotine  14 mg/24hr patch Commonly known as: NICODERM CQ  - dosed in mg/24 hours Place 1 patch (14 mg total)  onto the skin daily.   omeprazole  40 MG capsule Commonly known as: PRILOSEC Take 1 capsule (40 mg total) by mouth daily.   oxyCODONE  5  MG immediate release tablet Commonly known as: Oxy IR/ROXICODONE  Take 1 tablet (5 mg total) by mouth every 6 (six) hours as needed for moderate pain (pain score 4-6) or severe pain (pain score 7-10).   thiamine  100 MG tablet Commonly known as: Vitamin B-1 Take 1 tablet (100 mg total) by mouth daily.         Follow-up Information     [facility]. Schedule an appointment as soon as possible for a visit in 1 week(s).   Contact information: [address] [employer] [place]  [address] [phone]                Major procedures and Radiology Reports - PLEASE review detailed and final reports thoroughly  -      VAS US  UPPER EXTREMITY VENOUS DUPLEX Result Date: 11/07/2023 UPPER VENOUS STUDY  Patient Name:  Kristopher [name]  Date of Exam:   11/07/2023 Medical Rec #: [id number]          Accession #:    [id number] Date of Birth: 18-Jul-1970         Patient Gender: M Patient Age:   72 years Exam Location:  [facility] Procedure:      VAS US  UPPER EXTREMITY VENOUS DUPLEX Referring Phys: LAVADA [facility] --------------------------------------------------------------------------------  Indications: Edema, and Numbness of hand, fingers, and forearm Risk Factors: ETOH abuse, withdrawal, pancreatitis. Comparison Study: No prior study on file Performing Technologist: Alberta Lis RVS  Examination Guidelines: A complete evaluation includes B-mode imaging, spectral Doppler, color Doppler, and power Doppler as needed of all accessible portions of each vessel. Bilateral testing is considered an integral part of a complete examination. Limited examinations for reoccurring indications may be performed as noted.  Right Findings: +----------+------------+---------+-----------+----------+-------+ RIGHT      CompressiblePhasicitySpontaneousPropertiesSummary +----------+------------+---------+-----------+----------+-------+ IJV                      Yes       Yes                      +----------+------------+---------+-----------+----------+-------+ Subclavian               Yes       Yes                      +----------+------------+---------+-----------+----------+-------+ Axillary      Full       Yes       Yes                      +----------+------------+---------+-----------+----------+-------+ Brachial      Full       Yes       Yes                      +----------+------------+---------+-----------+----------+-------+ Radial        Full                                          +----------+------------+---------+-----------+----------+-------+ Ulnar         Full                                          +----------+------------+---------+-----------+----------+-------+ Cephalic  Partial      No        No                Acute  +----------+------------+---------+-----------+----------+-------+ Basilic     Partial      No        No                Acute  +----------+------------+---------+-----------+----------+-------+  Left Findings: +----------+------------+---------+-----------+----------+-------+ LEFT      CompressiblePhasicitySpontaneousPropertiesSummary +----------+------------+---------+-----------+----------+-------+ Subclavian               Yes       Yes                      +----------+------------+---------+-----------+----------+-------+ Arterial and venous flow noted to posterior hand and snuff box  Summary:  Right: No evidence of deep vein thrombosis in the upper extremity. Findings consistent with acute superficial vein thrombosis involving the right basilic vein and right cephalic vein.  Left: No evidence of thrombosis in the subclavian.  *See table(s) above for measurements and observations.  Diagnosing physician: Gaile New  MD Electronically signed by Gaile New MD on 11/07/2023 at 7:13:23 PM.    Final    CT Cervical Spine Wo Contrast Result Date: 11/06/2023 CLINICAL DATA:  53 year old male with recent seizure activity. Right upper extremity weakness, cervical radiculopathy. EXAM: CT CERVICAL SPINE WITHOUT CONTRAST TECHNIQUE: Multidetector CT imaging of the cervical spine was performed without intravenous contrast. Multiplanar CT image reconstructions were also generated. RADIATION DOSE REDUCTION: This exam was performed according to the departmental dose-optimization program which includes automated exposure control, adjustment of the mA and/or kV according to patient size and/or use of iterative reconstruction technique. COMPARISON:  Head CT earlier today. Cervical spine CT 12/14/2022. And cervical spine MRI 12/14/2022. FINDINGS: Alignment: Chronic straightening, mildly increased reversal of cervical lordosis since last year. Cervicothoracic junction alignment is within normal limits. Bilateral posterior element alignment is within normal limits. Skull base and vertebrae: Bone mineralization is within normal limits. Visualized skull base is intact. No atlanto-occipital dissociation. C1 and C2 appear intact and aligned. No acute osseous abnormality identified. Soft tissues and spinal canal: No prevertebral fluid or swelling. No visible canal hematoma. Negative visible noncontrast neck soft tissues. Disc levels:  C2-C3 and C3-C4 appear negative. C4-C5:  Mild disc space loss.  Mostly anterior endplate spurring. C5-C6: Disc space loss with circumferential disc bulge and endplate spurring which is asymmetric to the right. Evidence of mild to moderate spinal stenosis, and moderate to severe right C6 foraminal stenosis. This level appears stable to the MRI last year. C6-C7: Severe disc space loss. Circumferential disc osteophyte complex which is more symmetric. Mild spinal stenosis, moderate to severe bilateral C7 foraminal stenosis.  This level appears stable from the MRI last year. C7-T1:  Negative. Upper chest: Negative visible noncontrast thoracic inlet. IMPRESSION: 1. No acute osseous abnormality in the cervical spine. 2. 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 Associated spinal stenosis and moderate to severe C6 and C7 nerve level foraminal stenosis which appears stable from MRI 12/14/2022. Electronically Signed   By: VEAR Hurst M.D.   On: 11/06/2023 06:20   CT ABDOMEN PELVIS WO CONTRAST Result Date: 11/06/2023 CLINICAL DATA:  Acute nonlocalized abdominal pain EXAM: CT ABDOMEN AND PELVIS WITHOUT CONTRAST TECHNIQUE: Multidetector CT imaging of the abdomen and pelvis was performed following the standard protocol without IV contrast. RADIATION DOSE REDUCTION: This exam was performed according to the departmental dose-optimization program  which includes automated exposure control, adjustment of the mA and/or kV according to patient size and/or use of iterative reconstruction technique. COMPARISON:  None Available. FINDINGS: Lower chest: No acute abnormality. Hepatobiliary: Moderate hepatic steatosis. No enhancing intrahepatic mass. No intra or extrahepatic biliary ductal dilation. Gallbladder unremarkable. Pancreas: Unremarkable Spleen: Unremarkable Adrenals/Urinary Tract: Adrenal glands are unremarkable. Kidneys are normal, without renal calculi, focal lesion, or hydronephrosis. Bladder is unremarkable. Stomach/Bowel: Mild descending colonic diverticulosis. Stomach, small bowel, and large bowel are otherwise unremarkable. Appendix normal. No evidence of obstruction or focal inflammation. No free intraperitoneal gas or fluid. Vascular/Lymphatic: Aortic atherosclerosis. No enlarged abdominal or pelvic lymph nodes. Reproductive: Prostate is unremarkable. Other: No abdominal wall hernia or abnormality. No abdominopelvic ascites. Musculoskeletal: Multiple healed right rib fractures noted. No acute or significant osseous  findings. IMPRESSION: 1. No acute intra-abdominal pathology identified. No definite radiographic explanation for the patient's reported symptoms. 2. Moderate hepatic steatosis. 3. Mild descending colonic diverticulosis. 4. Aortic atherosclerosis. Aortic Atherosclerosis (ICD10-I70.0). Electronically Signed   By: Dorethia Molt M.D.   On: 11/06/2023 03:57   CT Head Wo Contrast Result Date: 11/06/2023 CLINICAL DATA:  Recent seizure activity EXAM: CT HEAD WITHOUT CONTRAST TECHNIQUE: Contiguous axial images were obtained from the base of the skull through the vertex without intravenous contrast. RADIATION DOSE REDUCTION: This exam was performed according to the departmental dose-optimization program which includes automated exposure control, adjustment of the mA and/or kV according to patient size and/or use of iterative reconstruction technique. COMPARISON:  12/14/2022 FINDINGS: Brain: No evidence of acute infarction, hemorrhage, hydrocephalus, extra-axial collection or mass lesion/mass effect. Vascular: No hyperdense vessel or unexpected calcification. Skull: Normal. Negative for fracture or focal lesion. Sinuses/Orbits: No acute finding. Other: None. IMPRESSION: No acute intracranial abnormality noted. Electronically Signed   By: Oneil Devonshire M.D.   On: 11/06/2023 03:54   CT ABDOMEN PELVIS WO CONTRAST Result Date: 10/13/2023 CLINICAL DATA:  Severe abdominal pain and vomiting for 3 days. EXAM: CT ABDOMEN AND PELVIS WITHOUT CONTRAST TECHNIQUE: Multidetector CT imaging of the abdomen and pelvis was performed following the standard protocol without IV contrast. RADIATION DOSE REDUCTION: This exam was performed according to the departmental dose-optimization program which includes automated exposure control, adjustment of the mA and/or kV according to patient size and/or use of iterative reconstruction technique. COMPARISON:  12/14/2022 FINDINGS: Lower chest: The lung bases are clear. Hepatobiliary: Diffuse hepatic  steatosis. Nodular contour the liver is noted with relative hypertrophy of the lateral segment of left lobe and caudate lobe of liver. No focal liver abnormality. Gallbladder appears distended. No calcified stones or pericholecystic inflammation. No bile duct dilatation. Pancreas: Unremarkable. No pancreatic ductal dilatation or surrounding inflammatory changes. Spleen: Normal in size without focal abnormality. Adrenals/Urinary Tract: Adrenal glands are unremarkable. Kidneys are normal, without renal calculi, focal lesion, or hydronephrosis. Bladder is unremarkable. Stomach/Bowel: Stomach is within normal limits. Appendix appears normal. No evidence of bowel wall thickening, distention, or inflammatory changes. Vascular/Lymphatic: Mild aortic atherosclerosis. Recanalized umbilical vein. No abdominopelvic adenopathy. Reproductive: Prostate is unremarkable. Other: No free fluid or fluid collections. Small fat containing umbilical hernia. Musculoskeletal: No acute or significant osseous findings. IMPRESSION: 1. No acute findings within the abdomen or pelvis. 2. Diffuse hepatic steatosis. Nodular contour the liver is noted with relative hypertrophy of the lateral segment of left lobe and caudate lobe of liver. Findings are compatible with cirrhosis. 3. Recanalized umbilical vein compatible suggest at least mild portal hypertension. No ascites or splenomegaly. 4. Small fat containing umbilical hernia. 5.  Aortic  Atherosclerosis (ICD10-I70.0). Electronically Signed   By: Waddell Calk M.D.   On: 10/13/2023 07:53    Micro Results    No results found for this or any previous visit (from the past 240 hours).  Today   Subjective    Kristopher [name] today has no headache,no chest abdominal pain,no new weakness tingling or numbness, feels much better wants to go home today.    Objective   Blood pressure 137/80, pulse 80, temperature 97.6 F (36.4 C), temperature source Oral, resp. rate 19, SpO2  95%.   Intake/Output Summary (Last 24 hours) at 11/11/2023 0847 Last data filed at 11/10/2023 1918 Gross per 24 hour  Intake 39.28 ml  Output --  Net 39.28 ml    Exam  Awake Alert, No new F.N deficits,    [place].AT,PERRAL Supple Neck,   Symmetrical Chest wall movement, Good air movement bilaterally, CTAB RRR,No Gallops,   +ve B.Sounds, Abd Soft, Non tender,  No Cyanosis, Clubbing or edema    Data Review   Recent Labs  Lab 11/06/23 0244 11/06/23 1805 11/07/23 0637 11/08/23 0907 11/09/23 1018  WBC 10.1 9.4 8.2 9.8 8.3  HGB 15.2 13.8 13.5 13.6 13.3  HCT 45.7 41.3 40.8 41.3 39.6  PLT 318 271 256 205 188  MCV 90.0 89.2 90.3 90.2 89.8  MCH 29.9 29.8 29.9 29.7 30.2  MCHC 33.3 33.4 33.1 32.9 33.6  RDW 16.9* 17.3* 17.2* 16.7* 16.5*  LYMPHSABS  --   --  2.6 1.5 2.1  MONOABS  --   --  0.4 0.3 0.3  EOSABS  --   --  0.0 0.1 0.1  BASOSABS  --   --  0.1 0.1 0.1    Recent Labs  Lab 11/06/23 0244 11/06/23 0314 11/06/23 1350 11/06/23 1805 11/07/23 0637 11/08/23 0907 11/08/23 0911 11/09/23 1018 11/10/23 0617  NA 141  --   --   --  136 136  --  135 137  K 4.0  --   --   --  3.7 3.8  --  3.8 4.0  CL 103  --   --   --  103 104  --  104 106  CO2 23  --   --   --  26 24  --  21* 20*  ANIONGAP 15  --   --   --  7 8  --  10 11  GLUCOSE 149*  --   --   --  147* 156*  --  172* 162*  BUN 6  --   --   --  10 11  --  9 8  CREATININE 0.78  --   --  0.74 0.82 0.74  --  0.69 0.76  AST  --  57*  --   --  31 37  --  70* 39  ALT  --  50*  --   --  35 36  --  67* 52*  ALKPHOS  --  73  --   --  62 59  --  54 50  BILITOT  --  0.4  --   --  1.0 0.3  --  0.8 0.5  ALBUMIN  --  3.7  --   --  3.1* 3.1*  --  3.0* 3.0*  INR  --   --   --   --   --   --   --  0.9  --   HGBA1C  --   --  6.8*  --   --   --   --   --   --  AMMONIA  --  32  --   --  49* 51*  --  64* 61*  MG  --   --   --   --  1.9 1.6*  --  1.7 1.9  PHOS  --   --   --   --   --   --  2.9  --   --   CALCIUM 9.4  --   --   --  8.7*  8.8*  --  8.5* 8.4*    Total Time in preparing paper work, data evaluation and todays exam - 35 minutes  Signature  -    Lavada Stank M.D on 11/11/2023 at 8:47 AM   -  To page go to [email or web address]

## 2023-11-19 ENCOUNTER — Emergency Department (HOSPITAL_COMMUNITY)

## 2023-11-19 ENCOUNTER — Encounter (HOSPITAL_COMMUNITY): Payer: Self-pay | Admitting: Family Medicine

## 2023-11-19 ENCOUNTER — Inpatient Hospital Stay (HOSPITAL_COMMUNITY)
Admission: EM | Admit: 2023-11-19 | Discharge: 2023-11-25 | DRG: 897 | Disposition: A | Discharge status: Home / Self Care | Attending: Internal Medicine | Admitting: Internal Medicine

## 2023-11-19 ENCOUNTER — Other Ambulatory Visit: Payer: Self-pay

## 2023-11-19 DIAGNOSIS — E669 Obesity, unspecified: Secondary | ICD-10-CM | POA: Diagnosis present

## 2023-11-19 DIAGNOSIS — Z7984 Long term (current) use of oral hypoglycemic drugs: Secondary | ICD-10-CM

## 2023-11-19 DIAGNOSIS — L03116 Cellulitis of left lower limb: Secondary | ICD-10-CM | POA: Diagnosis present

## 2023-11-19 DIAGNOSIS — F10231 Alcohol dependence with withdrawal delirium: Secondary | ICD-10-CM | POA: Diagnosis present

## 2023-11-19 DIAGNOSIS — R569 Unspecified convulsions: Secondary | ICD-10-CM | POA: Diagnosis present

## 2023-11-19 DIAGNOSIS — F10939 Alcohol use, unspecified with withdrawal, unspecified: Secondary | ICD-10-CM | POA: Diagnosis not present

## 2023-11-19 DIAGNOSIS — E119 Type 2 diabetes mellitus without complications: Secondary | ICD-10-CM | POA: Diagnosis present

## 2023-11-19 DIAGNOSIS — Z79899 Other long term (current) drug therapy: Secondary | ICD-10-CM

## 2023-11-19 DIAGNOSIS — M50323 Other cervical disc degeneration at C6-C7 level: Secondary | ICD-10-CM

## 2023-11-19 DIAGNOSIS — I10 Essential (primary) hypertension: Secondary | ICD-10-CM | POA: Diagnosis present

## 2023-11-19 DIAGNOSIS — M50322 Other cervical disc degeneration at C5-C6 level: Secondary | ICD-10-CM

## 2023-11-19 DIAGNOSIS — F17210 Nicotine dependence, cigarettes, uncomplicated: Secondary | ICD-10-CM | POA: Diagnosis present

## 2023-11-19 DIAGNOSIS — J449 Chronic obstructive pulmonary disease, unspecified: Secondary | ICD-10-CM | POA: Diagnosis present

## 2023-11-19 DIAGNOSIS — Z6838 Body mass index (BMI) 38.0-38.9, adult: Secondary | ICD-10-CM | POA: Diagnosis not present

## 2023-11-19 DIAGNOSIS — Z833 Family history of diabetes mellitus: Secondary | ICD-10-CM | POA: Diagnosis not present

## 2023-11-19 DIAGNOSIS — F10931 Alcohol use, unspecified with withdrawal delirium: Secondary | ICD-10-CM | POA: Diagnosis not present

## 2023-11-19 DIAGNOSIS — K861 Other chronic pancreatitis: Secondary | ICD-10-CM | POA: Diagnosis not present

## 2023-11-19 DIAGNOSIS — L03115 Cellulitis of right lower limb: Secondary | ICD-10-CM | POA: Diagnosis present

## 2023-11-19 DIAGNOSIS — K219 Gastro-esophageal reflux disease without esophagitis: Secondary | ICD-10-CM | POA: Diagnosis present

## 2023-11-19 DIAGNOSIS — M50321 Other cervical disc degeneration at C4-C5 level: Secondary | ICD-10-CM | POA: Diagnosis not present

## 2023-11-19 DIAGNOSIS — K7030 Alcoholic cirrhosis of liver without ascites: Secondary | ICD-10-CM | POA: Diagnosis present

## 2023-11-19 DIAGNOSIS — Z91041 Radiographic dye allergy status: Secondary | ICD-10-CM | POA: Diagnosis not present

## 2023-11-19 DIAGNOSIS — K859 Acute pancreatitis without necrosis or infection, unspecified: Secondary | ICD-10-CM | POA: Diagnosis not present

## 2023-11-19 DIAGNOSIS — G8929 Other chronic pain: Secondary | ICD-10-CM | POA: Diagnosis present

## 2023-11-19 DIAGNOSIS — R1084 Generalized abdominal pain: Secondary | ICD-10-CM | POA: Diagnosis present

## 2023-11-19 DIAGNOSIS — F419 Anxiety disorder, unspecified: Secondary | ICD-10-CM | POA: Diagnosis present

## 2023-11-19 DIAGNOSIS — R1013 Epigastric pain: Secondary | ICD-10-CM

## 2023-11-19 DIAGNOSIS — Y906 Blood alcohol level of 120-199 mg/100 ml: Secondary | ICD-10-CM | POA: Diagnosis present

## 2023-11-19 DIAGNOSIS — R55 Syncope and collapse: Secondary | ICD-10-CM | POA: Diagnosis not present

## 2023-11-19 DIAGNOSIS — F10930 Alcohol use, unspecified with withdrawal, uncomplicated: Secondary | ICD-10-CM

## 2023-11-19 LAB — CBC
HCT: 44.5 % (ref 39.0–52.0)
Hemoglobin: 15.2 g/dL (ref 13.0–17.0)
MCH: 29.8 pg (ref 26.0–34.0)
MCHC: 34.2 g/dL (ref 30.0–36.0)
MCV: 87.3 fL (ref 80.0–100.0)
Platelets: 353 K/uL (ref 150–400)
RBC: 5.1 MIL/uL (ref 4.22–5.81)
RDW: 16.3 % — ABNORMAL HIGH (ref 11.5–15.5)
WBC: 8.6 K/uL (ref 4.0–10.5)
nRBC: 0 % (ref 0.0–0.2)

## 2023-11-19 LAB — COMPREHENSIVE METABOLIC PANEL WITH GFR
ALT: 56 U/L — ABNORMAL HIGH (ref 0–44)
AST: 50 U/L — ABNORMAL HIGH (ref 15–41)
Albumin: 4 g/dL (ref 3.5–5.0)
Alkaline Phosphatase: 69 U/L (ref 38–126)
Anion gap: 15 (ref 5–15)
BUN: <5 mg/dL — ABNORMAL LOW (ref 6–20)
CO2: 23 mmol/L (ref 22–32)
Calcium: 9.2 mg/dL (ref 8.9–10.3)
Chloride: 100 mmol/L (ref 98–111)
Creatinine, Ser: 0.73 mg/dL (ref 0.61–1.24)
GFR, Estimated: >60 mL/min (ref >60)
Glucose, Bld: 98 mg/dL (ref 70–99)
Potassium: 3.7 mmol/L (ref 3.5–5.1)
Sodium: 138 mmol/L (ref 135–145)
Total Bilirubin: 0.5 mg/dL (ref 0.0–1.2)
Total Protein: 6.8 g/dL (ref 6.5–8.1)

## 2023-11-19 LAB — CBG MONITORING, ED: Glucose-Capillary: 130 mg/dL — ABNORMAL HIGH (ref 70–99)

## 2023-11-19 LAB — LIPASE, BLOOD: Lipase: 36 U/L (ref 11–51)

## 2023-11-19 LAB — MAGNESIUM: Magnesium: 2.1 mg/dL (ref 1.7–2.4)

## 2023-11-19 LAB — ETHANOL: Alcohol, Ethyl (B): 156 mg/dL — ABNORMAL HIGH (ref <15)

## 2023-11-19 MED ORDER — LORAZEPAM 1 MG PO TABS
0.0000 mg | ORAL_TABLET | Freq: Three times a day (TID) | ORAL | Status: AC
  Administered 2023-11-21 – 2023-11-23 (×3): 1 mg via ORAL
  Filled 2023-11-19 (×3): qty 1

## 2023-11-19 MED ORDER — PHENOBARBITAL SODIUM 130 MG/ML IJ SOLN
65.0000 mg | Freq: Three times a day (TID) | INTRAMUSCULAR | Status: DC
  Administered 2023-11-21 – 2023-11-23 (×5): 65 mg via INTRAVENOUS
  Filled 2023-11-19 (×5): qty 1

## 2023-11-19 MED ORDER — IPRATROPIUM-ALBUTEROL 0.5-2.5 (3) MG/3ML IN SOLN: 3.0000 mL | Freq: Four times a day (QID) | RESPIRATORY_TRACT | Status: DC | PRN

## 2023-11-19 MED ORDER — PANTOPRAZOLE SODIUM 40 MG PO TBEC
40.0000 mg | DELAYED_RELEASE_TABLET | Freq: Every day | ORAL | Status: DC
  Administered 2023-11-20 – 2023-11-25 (×6): 40 mg via ORAL
  Filled 2023-11-19 (×6): qty 1

## 2023-11-19 MED ORDER — THIAMINE MONONITRATE 100 MG PO TABS
100.0000 mg | ORAL_TABLET | Freq: Every day | ORAL | Status: DC
  Administered 2023-11-19 – 2023-11-25 (×7): 100 mg via ORAL
  Filled 2023-11-19 (×7): qty 1

## 2023-11-19 MED ORDER — PROCHLORPERAZINE EDISYLATE 10 MG/2ML IJ SOLN
5.0000 mg | Freq: Four times a day (QID) | INTRAMUSCULAR | Status: DC | PRN
  Administered 2023-11-20: 5 mg via INTRAVENOUS
  Filled 2023-11-19: qty 2

## 2023-11-19 MED ORDER — LORAZEPAM 1 MG PO TABS
2.0000 mg | ORAL_TABLET | Freq: Once | ORAL | Status: AC
  Administered 2023-11-19: 2 mg via ORAL
  Filled 2023-11-19: qty 2

## 2023-11-19 MED ORDER — OXYCODONE HCL 5 MG PO TABS
5.0000 mg | ORAL_TABLET | ORAL | Status: DC | PRN
  Administered 2023-11-20 – 2023-11-25 (×28): 10 mg via ORAL
  Filled 2023-11-19 (×28): qty 2

## 2023-11-19 MED ORDER — ACETAMINOPHEN 325 MG PO TABS: 650.0000 mg | ORAL_TABLET | Freq: Four times a day (QID) | ORAL | Status: DC | PRN

## 2023-11-19 MED ORDER — LORAZEPAM 2 MG/ML IJ SOLN: 1.0000 mg | INTRAMUSCULAR | Status: AC | PRN

## 2023-11-19 MED ORDER — CARVEDILOL 6.25 MG PO TABS
6.2500 mg | ORAL_TABLET | Freq: Two times a day (BID) | ORAL | Status: DC
  Administered 2023-11-20 – 2023-11-25 (×11): 6.25 mg via ORAL
  Filled 2023-11-19 (×7): qty 1
  Filled 2023-11-19: qty 2
  Filled 2023-11-19 (×3): qty 1

## 2023-11-19 MED ORDER — PHENOBARBITAL SODIUM 130 MG/ML IJ SOLN
130.0000 mg | INTRAMUSCULAR | Status: AC
  Administered 2023-11-19: 130 mg via INTRAVENOUS
  Filled 2023-11-19: qty 1

## 2023-11-19 MED ORDER — ONDANSETRON HCL 4 MG/2ML IJ SOLN
4.0000 mg | Freq: Once | INTRAMUSCULAR | Status: AC
  Administered 2023-11-19: 4 mg via INTRAVENOUS
  Filled 2023-11-19: qty 2

## 2023-11-19 MED ORDER — HYDROMORPHONE HCL 1 MG/ML IJ SOLN
1.0000 mg | Freq: Once | INTRAMUSCULAR | Status: AC
  Administered 2023-11-19: 1 mg via INTRAVENOUS
  Filled 2023-11-19: qty 1

## 2023-11-19 MED ORDER — PHENOBARBITAL SODIUM 130 MG/ML IJ SOLN
97.5000 mg | Freq: Three times a day (TID) | INTRAMUSCULAR | Status: AC
  Administered 2023-11-19 – 2023-11-21 (×6): 97.5 mg via INTRAVENOUS
  Filled 2023-11-19 (×6): qty 1

## 2023-11-19 MED ORDER — LACTULOSE 10 GM/15ML PO SOLN
30.0000 g | Freq: Three times a day (TID) | ORAL | Status: DC
  Administered 2023-11-20: 30 g via ORAL
  Administered 2023-11-21 (×2): 20 g via ORAL
  Administered 2023-11-21 – 2023-11-22 (×2): 30 g via ORAL
  Administered 2023-11-22 – 2023-11-24 (×6): 20 g via ORAL
  Administered 2023-11-25: 30 g via ORAL
  Filled 2023-11-19 (×13): qty 60

## 2023-11-19 MED ORDER — PHENOBARBITAL SODIUM 130 MG/ML IJ SOLN: 130.0000 mg | INTRAMUSCULAR | Status: DC

## 2023-11-19 MED ORDER — FOLIC ACID 1 MG PO TABS
1.0000 mg | ORAL_TABLET | Freq: Every day | ORAL | Status: DC
  Administered 2023-11-19 – 2023-11-25 (×7): 1 mg via ORAL
  Filled 2023-11-19 (×7): qty 1

## 2023-11-19 MED ORDER — LORAZEPAM 1 MG PO TABS: 1.0000 mg | ORAL_TABLET | ORAL | Status: AC | PRN

## 2023-11-19 MED ORDER — ADULT MULTIVITAMIN W/MINERALS CH
1.0000 | ORAL_TABLET | Freq: Every day | ORAL | Status: DC
  Administered 2023-11-19 – 2023-11-25 (×6): 1 via ORAL
  Filled 2023-11-19 (×7): qty 1

## 2023-11-19 MED ORDER — LACTATED RINGERS IV BOLUS
1000.0000 mL | Freq: Once | INTRAVENOUS | Status: AC
  Administered 2023-11-19: 1000 mL via INTRAVENOUS

## 2023-11-19 MED ORDER — FENTANYL CITRATE PF 50 MCG/ML IJ SOSY
50.0000 ug | PREFILLED_SYRINGE | Freq: Once | INTRAMUSCULAR | Status: AC
  Administered 2023-11-19: 50 ug via INTRAVENOUS
  Filled 2023-11-19: qty 1

## 2023-11-19 MED ORDER — LORAZEPAM 1 MG PO TABS
0.0000 mg | ORAL_TABLET | ORAL | Status: AC
  Administered 2023-11-20 – 2023-11-21 (×5): 1 mg via ORAL
  Filled 2023-11-19 (×5): qty 1

## 2023-11-19 MED ORDER — SODIUM CHLORIDE 0.9 % IV SOLN
260.0000 mg | INTRAVENOUS | Status: AC
  Administered 2023-11-19: 260 mg via INTRAVENOUS
  Filled 2023-11-19: qty 2

## 2023-11-19 MED ORDER — INSULIN ASPART 100 UNIT/ML IJ SOLN
0.0000 [IU] | Freq: Three times a day (TID) | INTRAMUSCULAR | Status: DC
  Administered 2023-11-20 – 2023-11-24 (×11): 1 [IU] via SUBCUTANEOUS

## 2023-11-19 MED ORDER — SODIUM CHLORIDE 0.9% FLUSH
3.0000 mL | Freq: Two times a day (BID) | INTRAVENOUS | Status: DC
  Administered 2023-11-19 – 2023-11-24 (×10): 3 mL via INTRAVENOUS

## 2023-11-19 MED ORDER — ENOXAPARIN SODIUM 40 MG/0.4ML IJ SOSY
40.0000 mg | PREFILLED_SYRINGE | INTRAMUSCULAR | Status: DC
  Administered 2023-11-19 – 2023-11-24 (×6): 40 mg via SUBCUTANEOUS
  Filled 2023-11-19 (×6): qty 0.4

## 2023-11-19 MED ORDER — ACETAMINOPHEN 650 MG RE SUPP: 650.0000 mg | Freq: Four times a day (QID) | RECTAL | Status: DC | PRN

## 2023-11-19 MED ORDER — HYDROMORPHONE HCL 1 MG/ML IJ SOLN
0.5000 mg | Freq: Once | INTRAMUSCULAR | Status: AC
  Administered 2023-11-19: 0.5 mg via INTRAVENOUS
  Filled 2023-11-19: qty 1

## 2023-11-19 MED ORDER — THIAMINE HCL 100 MG/ML IJ SOLN
100.0000 mg | Freq: Every day | INTRAMUSCULAR | Status: DC
  Filled 2023-11-19 (×4): qty 2

## 2023-11-19 MED ORDER — METHOCARBAMOL 500 MG PO TABS
500.0000 mg | ORAL_TABLET | Freq: Four times a day (QID) | ORAL | Status: DC | PRN
  Administered 2023-11-20 – 2023-11-22 (×3): 500 mg via ORAL
  Filled 2023-11-19 (×3): qty 1

## 2023-11-19 MED ORDER — PHENOBARBITAL SODIUM 65 MG/ML IJ SOLN: 32.5000 mg | Freq: Three times a day (TID) | INTRAMUSCULAR | Status: DC

## 2023-11-19 MED ORDER — INSULIN ASPART 100 UNIT/ML IJ SOLN: 0.0000 [IU] | Freq: Every day | INTRAMUSCULAR | Status: DC

## 2023-11-19 NOTE — ED Notes (Signed)
 Patient transported to CT

## 2023-11-19 NOTE — ED Triage Notes (Signed)
 Pt arrives POV stating that he had a seizure this am at 0530. Last drink of alcohol being an hour prior to arrival. Normally drinks a couple cases of beer a day. Pt was admitted for same recently. Pt also complains of generalized abdominal pain that started 2 days ago.

## 2023-11-19 NOTE — ED Notes (Signed)
 Notified Dr. Jakie of runs of V-tach and V-Fib. Obtained EKG and printed out strips. RN notified Dr also. Patient currently having vigorous tremors and will not stay still therefore, unable to obtain clear EKG.

## 2023-11-19 NOTE — Consult Note (Signed)
 NAME:  Kristopher [name], MRN:  [id number], DOB:  Nov 04, 1970, LOS: 0 ADMISSION DATE:  11/19/2023, CONSULTATION DATE:  11/19/23 REFERRING MD:  ED, CHIEF COMPLAINT:  abdominal pain   History of Present Illness:  53 year old male with alcohol abuse and recurrent alcohol withdrawal sometimes complicated presents with recurrent alcohol withdrawal symptoms with reported decreased intake of alcohol at home.  He would like to abstain from alcohol.  Wants to get clean.  Has decreased his drinking throughout the day.  Usually drinks 48 natural ice.  Yesterday he drank about 20 mixed drinks or some other drink that is 10% alcohol, unclear the volume.  He thinks it is a relative decrease in the amount he drinks.  Started have withdrawal symptoms through the morning and afternoon.  Presented here.  Similar symptoms in the past.  He has acute on chronic abdominal pain he relates to his pancreatitis.  He seems more interested in pain medicine than alcohol withdrawal treatment repeatedly asking for Dilaudid .  Imaging largely stable with signs of cirrhosis, no concern on CT head or CT cervical spine.  His labs are largely normal.  Essentially unchanged from prior hospitalization with mild elevation in LFTs with normal bilirubin.  Pertinent  Medical History  Alcohol abuse  Significant Hospital Events: Including procedures, antibiotic start and stop dates in addition to other pertinent events     Interim History / Subjective:    Objective    Blood pressure (!) 121/90, pulse 90, temperature 98.5 F (36.9 C), temperature source Oral, resp. rate 12, height 5' 11 (1.803 m), weight 118.4 kg, SpO2 95%.        Intake/Output Summary (Last 24 hours) at 11/19/2023 1821 Last data filed at 11/19/2023 1450 Gross per 24 hour  Intake 1000.05 ml  Output --  Net 1000.05 ml   Filed Weights   11/19/23 1134  Weight: 118.4 kg    Examination: General: Sitting up in bed, repetitive shaking motions of the arms HENT:  Atraumatic normocephalic Lungs: Normal work of breathing on room air Cardiovascular: Regular rate and rhythm Abdomen: Nondistended, mildly tender Extremities: Several linear scratches with some with mild surrounding erythema on the bilateral shins/lower extremities Neuro: Alert conversant noncombative redirectable no focal deficits  Resolved problem list   Assessment and Plan   Acute alcohol withdrawal: Severe alcohol abuse with recurrent withdrawal, concern for DT in the past.  He is scoring high on CIWA but he is alert conversant with no agitation whatsoever.  CIWA score is high with subjective answers. -- Status post IV phenobarbital  -- Pharmacy consult phenobarbital  taper -- 2 mg p.o. Ativan  now, consider additional Ativan  if needed (although higher risk of respiratory suppression with phenobarbital  taper and pain issues) versus starting Precedex as adjunct of therapy in the coming hours if no improvement, this can be done via TRH for 24 hours if felt necessary  Abdominal pain due to acute on chronic pancreatitis: -- Cautious with pain medications, additional one-time dose of IV Dilaudid  now, consider oral agents per primary team  PCCM is available as needed, recommend admission to stepdown at this time.  If patient were to clinically worsen despite Precedex addition if needed, please contact on-call pager for further evaluation.  Best Practice (right click and Reselect all SmartList Selections daily)   Per primary  Labs   CBC: Recent Labs  Lab 11/19/23 1135  WBC 8.6  HGB 15.2  HCT 44.5  MCV 87.3  PLT 353    Basic Metabolic Panel: Recent Labs  Lab  11/19/23 1135  NA 138  K 3.7  CL 100  CO2 23  GLUCOSE 98  BUN <5*  CREATININE 0.73  CALCIUM 9.2   GFR: Estimated Creatinine Clearance: 141.3 mL/min (by C-G formula based on SCr of 0.73 mg/dL). Recent Labs  Lab 11/19/23 1135  WBC 8.6    Liver Function Tests: Recent Labs  Lab 11/19/23 1135  AST 50*  ALT 56*   ALKPHOS 69  BILITOT 0.5  PROT 6.8  ALBUMIN 4.0   Recent Labs  Lab 11/19/23 1135  LIPASE 36   No results for input(s): AMMONIA in the last 168 hours.  ABG    Component Value Date/Time   TCO2 24 12/14/2022 0047     Coagulation Profile: No results for input(s): INR, PROTIME in the last 168 hours.  Cardiac Enzymes: No results for input(s): CKTOTAL, CKMB, CKMBINDEX, TROPONINI in the last 168 hours.  HbA1C: Hgb A1c MFr Bld  Date/Time Value Ref Range Status  11/06/2023 01:50 PM 6.8 (H) 4.8 - 5.6 % Final    Comment:    (NOTE)         Prediabetes: 5.7 - 6.4         Diabetes: >6.4         Glycemic control for adults with diabetes: <7.0   12/12/2022 01:24 PM 7.7 (H) 4.8 - 5.6 % Final    Comment:    (NOTE) Pre diabetes:          5.7%-6.4%  Diabetes:              >6.4%  Glycemic control for   <7.0% adults with diabetes     CBG: No results for input(s): GLUCAP in the last 168 hours.  Review of Systems:   No chest pain no shortness of breath orthopnea or PND comprehensive review of systems otherwise negative  Past Medical History:  He,  has a past medical history of Alcohol dependence (HCC), COPD (chronic obstructive pulmonary disease) (HCC), Diabetes mellitus without complication (HCC), GERD (gastroesophageal reflux disease), Hypertension, Pancreatitis, and Pancreatitis.   Surgical History:   Past Surgical History:  Procedure Laterality Date   ABDOMINAL SURGERY     ESOPHAGOGASTRODUODENOSCOPY N/A 02/12/2020   Procedure: ESOPHAGOGASTRODUODENOSCOPY (EGD);  Surgeon: Toledo, Ladell POUR, MD;  Location: [facility] ENDOSCOPY;  Service: Gastroenterology;  Laterality: N/A;     Social History:   reports that he has been smoking cigarettes. He has never used smokeless tobacco. He reports current alcohol use of about 18.0 standard drinks of alcohol per week. He reports that he does not currently use drugs after having used the following drugs: Marijuana and Cocaine.    Family History:  His family history includes Diabetes in an other family member.   Allergies Allergies  Allergen Reactions   Ivp Dye [Iodinated Contrast Media] Anaphylaxis     Home Medications  Prior to Admission medications   Medication Sig Start Date End Date Taking? Authorizing Provider  carvedilol  (COREG ) 6.25 MG tablet Take 1 tablet (6.25 mg total) by mouth 2 (two) times daily with a meal. Patient taking differently: Take 6.25 mg by mouth daily. 11/11/23  Yes Singh, Prashant K, MD  oxyCODONE  (OXY IR/ROXICODONE ) 5 MG immediate release tablet Take 1 tablet (5 mg total) by mouth every 6 (six) hours as needed for moderate pain (pain score 4-6) or severe pain (pain score 7-10). 10/13/23  Yes Bradler, Evan K, MD  albuterol  (VENTOLIN  HFA) 108 (90 Base) MCG/ACT inhaler Inhale 1-2 puffs into the lungs every 6 (six) hours  as needed for wheezing or shortness of breath. Patient not taking: Reported on 09/19/2023 06/25/22   Rising, Asberry, PA-C  folic acid  (FOLVITE ) 1 MG tablet Take 1 tablet (1 mg total) by mouth daily. Patient not taking: Reported on 11/19/2023 11/11/23   Singh, Prashant K, MD  gabapentin  (NEURONTIN ) 300 MG capsule Take 1 capsule (300 mg total) by mouth 3 (three) times daily. Patient not taking: Reported on 11/06/2023 12/18/22 01/17/23  Mardy Elveria DEL, NP  hydrOXYzine  (ATARAX ) 25 MG tablet Take 1 tablet (25 mg total) by mouth 3 (three) times daily as needed for anxiety. Patient not taking: Reported on 09/19/2023 12/18/22   Mardy Elveria DEL, NP  lactulose  (CHRONULAC ) 10 GM/15ML solution Take 45 mLs (30 g total) by mouth 3 (three) times daily. Patient not taking: Reported on 11/19/2023 11/11/23   Singh, Prashant K, MD  lisinopril  (ZESTRIL ) 10 MG tablet Take 10 mg by mouth daily. Patient not taking: Reported on 11/06/2023 09/12/23   [provider]  metFORMIN  (GLUCOPHAGE ) 1000 MG tablet Take 1,000 mg by mouth.  Take 1,000 mg by mouth in the morning and 1,000 mg before  bedtime. Patient not taking: Reported on 09/19/2023 03/24/23   [provider]  methocarbamol  (ROBAXIN ) 500 MG tablet Take 500 mg by mouth 3 (three) times daily. Patient not taking: Reported on 09/19/2023 08/30/23   [provider]  nicotine  (NICODERM CQ  - DOSED IN MG/24 HOURS) 14 mg/24hr patch Place 1 patch (14 mg total) onto the skin daily. Patient not taking: Reported on 11/19/2023 11/11/23   Singh, Prashant K, MD  omeprazole  (PRILOSEC) 40 MG capsule Take 1 capsule (40 mg total) by mouth daily. Patient not taking: Reported on 11/19/2023 11/11/23   Singh, Prashant K, MD  thiamine  (VITAMIN B1) 100 MG tablet Take 1 tablet (100 mg total) by mouth daily. Patient not taking: Reported on 11/19/2023 11/11/23   Dennise Lavada POUR, MD     Critical care time: n/a

## 2023-11-19 NOTE — ED Notes (Signed)
 ED Provider at bedside.

## 2023-11-19 NOTE — ED Provider Notes (Signed)
  Physical Exam  BP (!) 162/80   Pulse (!) 102   Temp 97.9 F (36.6 C)   Resp 17   Ht 5' 11 (1.803 m)   Wt 118.4 kg   SpO2 98%   BMI 36.40 kg/m   Physical Exam  Procedures  .Critical Care  Performed by: Jerrol Agent, MD Authorized by: Jerrol Agent, MD   Critical care provider statement:    Critical care time (minutes):  30   Critical care was time spent personally by me on the following activities:  Development of treatment plan with patient or surrogate, discussions with consultants, evaluation of patient's response to treatment, examination of patient, ordering and review of laboratory studies, ordering and review of radiographic studies, ordering and performing treatments and interventions, pulse oximetry, re-evaluation of patient's condition and review of old charts   Care discussed with: admitting provider     ED Course / MDM   Clinical Course as of 11/19/23 1551  Tue Nov 19, 2023  1535 Signed out to Dr Jerrol [RP]    Clinical Course User Index [RP] Yolande Lamar BROCKS, MD   Medical Decision Making Amount and/or Complexity of Data Reviewed Labs: ordered. Radiology: ordered.  Risk Prescription drug management. Decision regarding hospitalization.   85M, here with poss seizure, in EtOH withdrawal. EtOH level 156, tremors, got Phenobarbital  130mg  x2, CIWA 18 on arrival, likely admit to hospitalist.   Repeat CIWA 22.  Discussed with the hospitalist, Dr. Kathrin, felt that the patient needed to be admitted to the ICU.  ICU consulted for admission, Norleen Cedar PA engaged for admission, will evaluate. Dr. Annella recommended progressive admission, did not recommend ICU admission. Hospitalist medicine re-paged, Dr. Charlton Accepted and admission.       Jerrol Agent, MD 11/19/23 [phone]

## 2023-11-19 NOTE — ED Notes (Signed)
 This RN notices v-fib on patient monitor. Upon assessment, pt does have tremor and denies chest pain. EKG exported by NT and given to Jakie, MD. CCMD also called to notify this RN, communicated to MD.

## 2023-11-19 NOTE — ED Provider Notes (Addendum)
 [place] EMERGENCY DEPARTMENT AT [facility] Provider Note   CSN: [id number] Arrival date & time: 11/19/23  1027     Patient presents with: Seizures   Kristopher [name] is a 53 y.o. male.   53 year old male with a history of alcohol abuse complicated by DTs pancreatitis who presents to the emergency department due to concerns for alcohol withdrawal.  Patient was recently hospitalized and discharged she said since going home he had some issues with his wife and started drinking again.  Thinks that he drinks two 40 ounce beers per day.  Last drink was around 7 this morning.  Says that he had it after he had what he thinks is a seizure at 5 AM this morning when he woke up on the ground.  Also has been having some abdominal burning as well.       Prior to Admission medications   Medication Sig Start Date End Date Taking? Authorizing Provider  carvedilol  (COREG ) 6.25 MG tablet Take 1 tablet (6.25 mg total) by mouth 2 (two) times daily with a meal. Patient taking differently: Take 6.25 mg by mouth daily. 11/11/23  Yes Singh, Prashant K, MD  oxyCODONE  (OXY IR/ROXICODONE ) 5 MG immediate release tablet Take 1 tablet (5 mg total) by mouth every 6 (six) hours as needed for moderate pain (pain score 4-6) or severe pain (pain score 7-10). 10/13/23  Yes Bradler, Artist MARLA, MD  albuterol  (VENTOLIN  HFA) 108 (90 Base) MCG/ACT inhaler Inhale 1-2 puffs into the lungs every 6 (six) hours as needed for wheezing or shortness of breath. Patient not taking: Reported on 09/19/2023 06/25/22   Rising, Asberry, PA-C  folic acid  (FOLVITE ) 1 MG tablet Take 1 tablet (1 mg total) by mouth daily. Patient not taking: Reported on 11/19/2023 11/11/23   Singh, Prashant K, MD  gabapentin  (NEURONTIN ) 300 MG capsule Take 1 capsule (300 mg total) by mouth 3 (three) times daily. Patient not taking: Reported on 11/06/2023 12/18/22 01/17/23  Mardy Elveria DEL, NP  hydrOXYzine  (ATARAX ) 25 MG tablet Take 1 tablet (25 mg total) by  mouth 3 (three) times daily as needed for anxiety. Patient not taking: Reported on 09/19/2023 12/18/22   Mardy Elveria DEL, NP  lactulose  (CHRONULAC ) 10 GM/15ML solution Take 45 mLs (30 g total) by mouth 3 (three) times daily. Patient not taking: Reported on 11/19/2023 11/11/23   Singh, Prashant K, MD  lisinopril  (ZESTRIL ) 10 MG tablet Take 10 mg by mouth daily. Patient not taking: Reported on 11/06/2023 09/12/23   [provider]  metFORMIN  (GLUCOPHAGE ) 1000 MG tablet Take 1,000 mg by mouth.  Take 1,000 mg by mouth in the morning and 1,000 mg before bedtime. Patient not taking: Reported on 09/19/2023 03/24/23   [provider]  methocarbamol  (ROBAXIN ) 500 MG tablet Take 500 mg by mouth 3 (three) times daily. Patient not taking: Reported on 09/19/2023 08/30/23   [provider]  nicotine  (NICODERM CQ  - DOSED IN MG/24 HOURS) 14 mg/24hr patch Place 1 patch (14 mg total) onto the skin daily. Patient not taking: Reported on 11/19/2023 11/11/23   Singh, Prashant K, MD  omeprazole  (PRILOSEC) 40 MG capsule Take 1 capsule (40 mg total) by mouth daily. Patient not taking: Reported on 11/19/2023 11/11/23   Singh, Prashant K, MD  thiamine  (VITAMIN B1) 100 MG tablet Take 1 tablet (100 mg total) by mouth daily. Patient not taking: Reported on 11/19/2023 11/11/23   Singh, Prashant K, MD    Allergies: Ivp dye [iodinated contrast media]  Review of Systems  Updated Vital Signs BP (!) 171/153   Pulse 97   Temp 98.5 F (36.9 C) (Oral)   Resp 18   Ht 5' 11 (1.803 m)   Wt 118.4 kg   SpO2 96%   BMI 36.40 kg/m   Physical Exam Vitals and nursing note reviewed.  Constitutional:      General: He is not in acute distress.    Appearance: He is well-developed.     Comments: Tremulous   HENT:     Head: Normocephalic and atraumatic.     Right Ear: External ear normal.     Left Ear: External ear normal.     Nose: Nose normal.  Eyes:     Extraocular Movements: Extraocular movements  intact.     Conjunctiva/sclera: Conjunctivae normal.     Pupils: Pupils are equal, round, and reactive to light.  Cardiovascular:     Rate and Rhythm: Regular rhythm. Tachycardia present.     Heart sounds: Normal heart sounds.  Pulmonary:     Effort: Pulmonary effort is normal. No respiratory distress.     Breath sounds: Normal breath sounds.  Abdominal:     General: There is no distension.     Palpations: Abdomen is soft. There is no mass.     Tenderness: There is abdominal tenderness (epigastric). There is no guarding.  Musculoskeletal:     Cervical back: Normal range of motion and neck supple.     Right lower leg: No edema.     Left lower leg: No edema.  Skin:    General: Skin is warm and dry.  Neurological:     Mental Status: He is alert and oriented to person, place, and time. Mental status is at baseline.     Motor: No weakness.  Psychiatric:        Mood and Affect: Mood normal.        Behavior: Behavior normal.     (all labs ordered are listed, but only abnormal results are displayed) Labs Reviewed  COMPREHENSIVE METABOLIC PANEL WITH GFR - Abnormal; Notable for the following components:      Result Value   BUN <5 (*)    AST 50 (*)    ALT 56 (*)    All other components within normal limits  CBC - Abnormal; Notable for the following components:   RDW 16.3 (*)    All other components within normal limits  ETHANOL - Abnormal; Notable for the following components:   Alcohol, Ethyl (B) 156 (*)    All other components within normal limits  LIPASE, BLOOD  URINALYSIS, ROUTINE W REFLEX MICROSCOPIC  MAGNESIUM     EKG: EKG Interpretation Date/Time:  Tuesday November 19 2023 14:47:10 EDT Ventricular Rate:  105 PR Interval:  164 QRS Duration:  103 QT Interval:  338 QTC Calculation: 449 R Axis:   166  Text Interpretation: Right and left arm electrode reversal, interpretation assumes no reversal Atrial flutter Paired ventricular premature complexes Aberrant conduction of  SV complex(es) Probable lateral infarct, age indeterminate Interpretation limited secondary to artifact Confirmed by Yolande Charleston [phone]) on 11/19/2023 3:30:40 PM  Radiology: CT Cervical Spine Wo Contrast Result Date: 11/19/2023 CLINICAL DATA:  Seizure EXAM: CT CERVICAL SPINE WITHOUT CONTRAST TECHNIQUE: Multidetector CT imaging of the cervical spine was performed without intravenous contrast. Multiplanar CT image reconstructions were also generated. RADIATION DOSE REDUCTION: This exam was performed according to the departmental dose-optimization program which includes automated exposure control, adjustment of the mA and/or kV according  to patient size and/or use of iterative reconstruction technique. COMPARISON:  None Available. FINDINGS: No significant alignment abnormality. The craniocervical junction is normal. The atlantoaxial articulation is normal. The dens is normal. There is no fracture identified. Other comments: There is degenerative disc disease at C4-5, C5-6 and C6-7. No significant facet disease. IMPRESSION: Degenerative disc disease at C4-C7. No fracture Electronically Signed   By: Nancyann Burns M.D.   On: 11/19/2023 15:27   CT Head Wo Contrast Result Date: 11/19/2023 CLINICAL DATA:  Seizure EXAM: CT HEAD WITHOUT CONTRAST TECHNIQUE: Contiguous axial images were obtained from the base of the skull through the vertex without intravenous contrast. RADIATION DOSE REDUCTION: This exam was performed according to the departmental dose-optimization program which includes automated exposure control, adjustment of the mA and/or kV according to patient size and/or use of iterative reconstruction technique. COMPARISON:  11/06/2023 FINDINGS: CT HEAD: Attenuation in the brain parenchyma is normal. There is no hemorrhage. No acute ischemic changes. No mass lesion. The ventricles are normal. Skull/sinuses/orbits: No significant abnormality. IMPRESSION: Normal Electronically Signed   By: Nancyann Burns M.D.    On: 11/19/2023 15:25   CT ABDOMEN PELVIS WO CONTRAST Result Date: 11/19/2023 CLINICAL DATA:  Abdominal pain.  History of pancreatitis. EXAM: CT ABDOMEN AND PELVIS WITHOUT CONTRAST TECHNIQUE: Multidetector CT imaging of the abdomen and pelvis was performed following the standard protocol without IV contrast. RADIATION DOSE REDUCTION: This exam was performed according to the departmental dose-optimization program which includes automated exposure control, adjustment of the mA and/or kV according to patient size and/or use of iterative reconstruction technique. COMPARISON:  November 06, 2023. FINDINGS: Lower chest: Minimal bilateral posterior basilar subsegmental atelectasis is noted. Hepatobiliary: No cholelithiasis or biliary dilatation is noted. Nodular hepatic contours are noted suggesting hepatic cirrhosis. Pancreas: Unremarkable. No pancreatic ductal dilatation or surrounding inflammatory changes. Spleen: Normal in size without focal abnormality. Adrenals/Urinary Tract: Adrenal glands are unremarkable. Kidneys are normal, without renal calculi, focal lesion, or hydronephrosis. Bladder is unremarkable. Stomach/Bowel: Stomach is within normal limits. Appendix appears normal. No evidence of bowel wall thickening, distention, or inflammatory changes. Vascular/Lymphatic: Aortic atherosclerosis. No enlarged abdominal or pelvic lymph nodes. Reproductive: Prostate is unremarkable. Other: No abdominal wall hernia or abnormality. No abdominopelvic ascites. Musculoskeletal: No acute or significant osseous findings. IMPRESSION: 1. Nodular hepatic contours are noted suggesting hepatic cirrhosis. 2. No other abnormality seen in the abdomen or pelvis. 3. Aortic atherosclerosis. Aortic Atherosclerosis (ICD10-I70.0). Electronically Signed   By: Lynwood Landy Raddle M.D.   On: 11/19/2023 15:20     Procedures   Medications Ordered in the ED  LORazepam  (ATIVAN ) tablet 2 mg (2 mg Oral Given 11/19/23 1155)  HYDROmorphone  (DILAUDID )  injection 0.5 mg (0.5 mg Intravenous Given 11/19/23 1326)  ondansetron  (ZOFRAN ) injection 4 mg (4 mg Intravenous Given 11/19/23 1330)  PHENObarbital  (LUMINAL) injection 130 mg (130 mg Intravenous Given 11/19/23 1327)  lactated ringers  bolus 1,000 mL (0 mLs Intravenous Stopped 11/19/23 1450)  PHENObarbital  (LUMINAL) injection 130 mg (130 mg Intravenous Given 11/19/23 1444)  HYDROmorphone  (DILAUDID ) injection 0.5 mg (0.5 mg Intravenous Given 11/19/23 1444)  PHENObarbital  (LUMINAL) 260 mg in sodium chloride  0.9 % 100 mL IVPB (0 mg Intravenous Stopped 11/19/23 1601)    Clinical Course as of 11/19/23 1637  Tue Nov 19, 2023  1535 Signed out to Dr Jerrol [RP]    Clinical Course User Index [RP] Yolande Lamar BROCKS, MD  Medical Decision Making Amount and/or Complexity of Data Reviewed Labs: ordered. Radiology: ordered.  Risk Prescription drug management. Decision regarding hospitalization.   Taquan K Cardiff is a 53 year old male with a history of alcohol abuse complicated by DTs pancreatitis who presents to the emergency department due to concerns for alcohol withdrawal.   Initial Ddx:  Alcohol withdrawal, delirium tremens, withdrawal seizures, cryptitis, peptic ulcer disease  MDM/Course:  The patient presents to the emergency department due to concerns for withdrawals.  On arrival he is tachycardic and mildly hypertensive.  Does have significant tremors and appears anxious.  Given the severity of his withdrawals in the past did start him on phenobarbital .  Did receive Ativan  prior to my arrival due to his elevated CIWA score.  He is also having some epigastric tenderness to palpation concerning for chronic pancreatitis.  Since he found himself on the floor and believes he had a seizure he did have a head and C-spine CT does not show acute abnormality.  CT of the abdomen pelvis did not show signs of pancreatitis.  Upon re-evaluation still appeared to be in alcohol  withdrawals and was given more phenobarbital .  Discussed with the oncoming physician awaiting callback from hospitalist.  Of note was called to review the patient's telemetry which there were some concerns about ventricular fibrillation.  After reviewing this appears to be from the patient's tremor and not in fact from any arrhythmia.  This patient presents to the ED for concern of complaints listed in HPI, this involves an extensive number of treatment options, and is a complaint that carries with it a high risk of complications and morbidity. Disposition including potential need for admission considered.   Dispo: Admit  Records reviewed DC Summary The following labs were independently interpreted: Chemistry and show no acute abnormality I independently reviewed the following imaging with scope of interpretation limited to determining acute life threatening conditions related to emergency care: CT Head and agree with the radiologist interpretation with the following exceptions: none I personally reviewed and interpreted cardiac monitoring: normal sinus rhythm  I personally reviewed and interpreted the pt's EKG: see above for interpretation  I have reviewed the patients home medications and made adjustments as needed Social Determinants of health:  Alcohol abuse  CRITICAL CARE Performed by: Lamar JAYSON Shan   Total critical care time: 30 minutes  Critical care time was exclusive of separately billable procedures and treating other patients.  Critical care was necessary to treat or prevent imminent or life-threatening deterioration.  Critical care was time spent personally by me on the following activities: development of treatment plan with patient and/or surrogate as well as nursing, discussions with consultants, evaluation of patient's response to treatment, examination of patient, obtaining history from patient or surrogate, ordering and performing treatments and interventions,  ordering and review of laboratory studies, ordering and review of radiographic studies, pulse oximetry and re-evaluation of patient's condition.   Portions of this note were generated with Scientist, clinical (histocompatibility and immunogenetics). Dictation errors may occur despite best attempts at proofreading.     Final diagnoses:  Alcohol withdrawal syndrome with complication (HCC)  Alcohol withdrawal seizure without complication [facility])  Epigastric pain    ED Discharge Orders     None          Shan Lamar JAYSON, MD 11/19/23 1637    Shan Lamar JAYSON, MD 11/19/23 1640

## 2023-11-19 NOTE — ED Notes (Signed)
 Cardiac monitor showing runs of v-tach. EKG exported, EDP notified. Pt denies chest pain.

## 2023-11-19 NOTE — ED Notes (Signed)
 Pt had to be woke up to receive his nightly meds. Respiratory rate at time of waking him was 12. PT asked for pain meds as well as Ativan  for his shaking. Pt took his medication cup out of this nurses hand and drank a cup of water with no shaking noted. When I mentioned this to his, he says well it comes and goes Informed patient that I was going to give him Phenobarbital  and I was not comfortable giving him other narcotics at this time since I had to wake him. I told him that in an hour if he was still in pain or shaking I would give something else. Pt no longer shaking after being told this.

## 2023-11-19 NOTE — ED Notes (Signed)
 Followed up with main lab to check status of previously added on magnesium . Lab states they are able to run it at this time.

## 2023-11-19 NOTE — ED Notes (Signed)
 CCMD called.

## 2023-11-19 NOTE — ED Notes (Signed)
 Pt continues to sleep.

## 2023-11-19 NOTE — ED Provider Triage Note (Signed)
 Emergency Medicine Provider Triage Evaluation Note  Kristopher [name] , a 53 y.o. male  was evaluated in triage.  Pt complains of alcohol withdrawal seizure this morning as well as abdominal pain/recurrent pancreatitis.  He has been drinking alcohol but less than normal.  Review of Systems  Positive: Abdominal pain, tremor, vomiting Negative: headache  Physical Exam  BP (!) 144/116 (BP Location: Right Arm)   Pulse (!) 110   Temp 97.9 F (36.6 C)   Resp 18   Ht 5' 11 (1.803 m)   Wt 118.4 kg   SpO2 96%   BMI 36.40 kg/m  Gen:   Awake, no distress Resp:  Normal effort MSK:   Moves extremities without difficulty, bilateral hand tremor Other:  Diffuse abdominal tenderness  Medical Decision Making  Medically screening exam initiated at 11:40 AM.  Appropriate orders placed.  Kristopher [name] was informed that the remainder of the evaluation will be completed by another provider, this initial triage assessment does not replace that evaluation, and the importance of remaining in the ED until their evaluation is complete.  P.o. Ativan  ordered as he does not have an IV.  Discussed with triage nurse that he will need a room and is not waiting room appropriate.   Freddi Hamilton, MD 11/19/23 1146

## 2023-11-19 NOTE — ED Notes (Signed)
Pt resting well.

## 2023-11-19 NOTE — H&P (Signed)
 History and Physical    LARK RUNK FMW:[id number] DOB: November 07, 1970 DOA: 11/19/2023  PCP: Patient, No Pcp Per   Patient coming from: Home   Chief Complaint: Tremors, anxiety, possible seizure, abdominal pain  HPI: Kristopher [name] is a 53 y.o. male with medical history significant for pancreatitis, chronic pain, type 2 diabetes mellitus, and alcohol abuse who presents with increased abdominal pain, tremors, anxiety, and possible seizure.  Patient has a difficult time quantifying his alcohol intake but states that it is usually over 20 drinks per 24-hour period.  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  He woke up on the floor and believes that he may have had a seizure early this morning.  His last drink was sometime around 7 or 8 AM today.  He is also concerned about increased upper abdominal pain, described as severe in intensity, burning in character, constant, localized, and associated with nausea and nonbloody vomiting.  ED Course: Upon arrival to the ED, patient is found to be afebrile and saturating well on room air with mild tachycardia and elevated blood pressure.  Labs are most notable for normal creatinine, AST 50, ALT 56, normal bilirubin, normal WBC, normal platelets, normal lipase, and ethanol 156.  CT in the ED is concerning for nodular liver contour.  Critical care was consulted by the ED physician and the patient was given a liter of LR, fentanyl , Dilaudid , phenobarbital , Ativan , and Zofran .  Review of Systems:  All other systems reviewed and apart from HPI, are negative.  Past Medical History:  Diagnosis Date   Alcohol dependence (HCC)    COPD (chronic obstructive pulmonary disease) (HCC)    Diabetes mellitus without complication (HCC)    GERD (gastroesophageal reflux disease)    Hypertension    Pancreatitis    Pancreatitis     Past Surgical History:  Procedure Laterality Date   ABDOMINAL SURGERY      ESOPHAGOGASTRODUODENOSCOPY N/A 02/12/2020   Procedure: ESOPHAGOGASTRODUODENOSCOPY (EGD);  Surgeon: Toledo, Ladell MARLA, MD;  Location: [facility] ENDOSCOPY;  Service: Gastroenterology;  Laterality: N/A;    Social History:   reports that he has been smoking cigarettes. He has never used smokeless tobacco. He reports current alcohol use of about 18.0 standard drinks of alcohol per week. He reports that he does not currently use drugs after having used the following drugs: Marijuana and Cocaine.  Allergies  Allergen Reactions   Ivp Dye [Iodinated Contrast Media] Anaphylaxis    Family History  Problem Relation Age of Onset   Diabetes Other      Prior to Admission medications   Medication Sig Start Date End Date Taking? Authorizing Provider  carvedilol  (COREG ) 6.25 MG tablet Take 1 tablet (6.25 mg total) by mouth 2 (two) times daily with a meal. Patient taking differently: Take 6.25 mg by mouth daily. 11/11/23  Yes Singh, Prashant K, MD  oxyCODONE  (OXY IR/ROXICODONE ) 5 MG immediate release tablet Take 1 tablet (5 mg total) by mouth every 6 (six) hours as needed for moderate pain (pain score 4-6) or severe pain (pain score 7-10). 10/13/23  Yes Bradler, Artist MARLA, MD  albuterol  (VENTOLIN  HFA) 108 (90 Base) MCG/ACT inhaler Inhale 1-2 puffs into the lungs every 6 (six) hours as needed for wheezing or shortness of breath. Patient not taking: Reported on 09/19/2023 06/25/22   Rising, Asberry, PA-C  folic acid  (FOLVITE ) 1 MG tablet Take 1 tablet (1 mg total) by mouth daily. Patient not taking: Reported  on 11/19/2023 11/11/23   Dennise Lavada POUR, MD  gabapentin  (NEURONTIN ) 300 MG capsule Take 1 capsule (300 mg total) by mouth 3 (three) times daily. Patient not taking: Reported on 11/06/2023 12/18/22 01/17/23  Mardy Elveria DEL, NP  hydrOXYzine  (ATARAX ) 25 MG tablet Take 1 tablet (25 mg total) by mouth 3 (three) times daily as needed for anxiety. Patient not taking: Reported on 09/19/2023 12/18/22   Mardy Elveria DEL,  NP  lactulose  (CHRONULAC ) 10 GM/15ML solution Take 45 mLs (30 g total) by mouth 3 (three) times daily. Patient not taking: Reported on 11/19/2023 11/11/23   Singh, Prashant K, MD  lisinopril  (ZESTRIL ) 10 MG tablet Take 10 mg by mouth daily. Patient not taking: Reported on 11/06/2023 09/12/23   [provider]  metFORMIN  (GLUCOPHAGE ) 1000 MG tablet Take 1,000 mg by mouth.  Take 1,000 mg by mouth in the morning and 1,000 mg before bedtime. Patient not taking: Reported on 09/19/2023 03/24/23   [provider]  methocarbamol  (ROBAXIN ) 500 MG tablet Take 500 mg by mouth 3 (three) times daily. Patient not taking: Reported on 09/19/2023 08/30/23   [provider]  nicotine  (NICODERM CQ  - DOSED IN MG/24 HOURS) 14 mg/24hr patch Place 1 patch (14 mg total) onto the skin daily. Patient not taking: Reported on 11/19/2023 11/11/23   Singh, Prashant K, MD  omeprazole  (PRILOSEC) 40 MG capsule Take 1 capsule (40 mg total) by mouth daily. Patient not taking: Reported on 11/19/2023 11/11/23   Singh, Prashant K, MD  thiamine  (VITAMIN B1) 100 MG tablet Take 1 tablet (100 mg total) by mouth daily. Patient not taking: Reported on 11/19/2023 11/11/23   Dennise Lavada POUR, MD    Physical Exam: Vitals:   11/19/23 1815 11/19/23 1830 11/19/23 1845 11/19/23 1854  BP: (!) 160/79 (!) 151/71 (!) 146/80   Pulse: 91 90 87   Resp: 18 19 12 14   Temp:    98 F (36.7 C)  TempSrc:    Oral  SpO2: 96% 92% 99% 100%  Weight:      Height:        Constitutional: NAD, no pallor or diaphoresis   Eyes: PERTLA, lids and conjunctivae normal ENMT: Mucous membranes are moist. Posterior pharynx clear of any exudate or lesions.   Neck: supple, no masses  Respiratory: no wheezing, no crackles. No accessory muscle use.  Cardiovascular: S1 & S2 heard, regular rate and rhythm. Trace lower extremity edema.  Abdomen: Soft, tender in upper abdomen. Bowel sounds active.  Musculoskeletal: no clubbing / cyanosis. No joint  deformity upper and lower extremities.   Skin: excoriations about the bilateral lower legs. Warm, dry, well-perfused. Neurologic: CN 2-12 grossly intact. Moving all extremities. Resting tremor. Alert and oriented.  Psychiatric: Calm. Cooperative.    Labs and Imaging on Admission: I have personally reviewed following labs and imaging studies  CBC: Recent Labs  Lab 11/19/23 1135  WBC 8.6  HGB 15.2  HCT 44.5  MCV 87.3  PLT 353   Basic Metabolic Panel: Recent Labs  Lab 11/19/23 1135 11/19/23 1136  NA 138  --   K 3.7  --   CL 100  --   CO2 23  --   GLUCOSE 98  --   BUN <5*  --   CREATININE 0.73  --   CALCIUM 9.2  --   MG  --  2.1   GFR: Estimated Creatinine Clearance: 141.3 mL/min (by C-G formula based on SCr of 0.73 mg/dL). Liver Function Tests: Recent Labs  Lab 11/19/23 1135  AST 50*  ALT 56*  ALKPHOS 69  BILITOT 0.5  PROT 6.8  ALBUMIN 4.0   Recent Labs  Lab 11/19/23 1135  LIPASE 36   No results for input(s): AMMONIA in the last 168 hours. Coagulation Profile: No results for input(s): INR, PROTIME in the last 168 hours. Cardiac Enzymes: No results for input(s): CKTOTAL, CKMB, CKMBINDEX, TROPONINI in the last 168 hours. BNP (last 3 results) No results for input(s): PROBNP in the last 8760 hours. HbA1C: No results for input(s): HGBA1C in the last 72 hours. CBG: No results for input(s): GLUCAP in the last 168 hours. Lipid Profile: No results for input(s): CHOL, HDL, LDLCALC, TRIG, CHOLHDL, LDLDIRECT in the last 72 hours. Thyroid  Function Tests: No results for input(s): TSH, T4TOTAL, FREET4, T3FREE, THYROIDAB in the last 72 hours. Anemia Panel: No results for input(s): VITAMINB12, FOLATE, FERRITIN, TIBC, IRON, RETICCTPCT in the last 72 hours. Urine analysis:    Component Value Date/Time   COLORURINE YELLOW (A) 10/13/2023 0945   APPEARANCEUR CLEAR (A) 10/13/2023 0945   LABSPEC 1.019 10/13/2023 0945    PHURINE 7.0 10/13/2023 0945   GLUCOSEU NEGATIVE 10/13/2023 0945   HGBUR NEGATIVE 10/13/2023 0945   BILIRUBINUR NEGATIVE 10/13/2023 0945   KETONESUR 20 (A) 10/13/2023 0945   PROTEINUR 100 (A) 10/13/2023 0945   NITRITE NEGATIVE 10/13/2023 0945   LEUKOCYTESUR NEGATIVE 10/13/2023 0945   Sepsis Labs: @LABRCNTIP (procalcitonin:4,lacticidven:4) )No results found for this or any previous visit (from the past 240 hours).   Radiological Exams on Admission: CT Cervical Spine Wo Contrast Result Date: 11/19/2023 CLINICAL DATA:  Seizure EXAM: CT CERVICAL SPINE WITHOUT CONTRAST TECHNIQUE: Multidetector CT imaging of the cervical spine was performed without intravenous contrast. Multiplanar CT image reconstructions were also generated. RADIATION DOSE REDUCTION: This exam was performed according to the departmental dose-optimization program which includes automated exposure control, adjustment of the mA and/or kV according to patient size and/or use of iterative reconstruction technique. COMPARISON:  None Available. FINDINGS: No significant alignment abnormality. The craniocervical junction is normal. The atlantoaxial articulation is normal. The dens is normal. There is no fracture identified. Other comments: There is degenerative disc disease at C4-5, C5-6 and C6-7. No significant facet disease. IMPRESSION: Degenerative disc disease at C4-C7. No fracture Electronically Signed   By: Nancyann Burns M.D.   On: 11/19/2023 15:27   CT Head Wo Contrast Result Date: 11/19/2023 CLINICAL DATA:  Seizure EXAM: CT HEAD WITHOUT CONTRAST TECHNIQUE: Contiguous axial images were obtained from the base of the skull through the vertex without intravenous contrast. RADIATION DOSE REDUCTION: This exam was performed according to the departmental dose-optimization program which includes automated exposure control, adjustment of the mA and/or kV according to patient size and/or use of iterative reconstruction technique. COMPARISON:   11/06/2023 FINDINGS: CT HEAD: Attenuation in the brain parenchyma is normal. There is no hemorrhage. No acute ischemic changes. No mass lesion. The ventricles are normal. Skull/sinuses/orbits: No significant abnormality. IMPRESSION: Normal Electronically Signed   By: Nancyann Burns M.D.   On: 11/19/2023 15:25   CT ABDOMEN PELVIS WO CONTRAST Result Date: 11/19/2023 CLINICAL DATA:  Abdominal pain.  History of pancreatitis. EXAM: CT ABDOMEN AND PELVIS WITHOUT CONTRAST TECHNIQUE: Multidetector CT imaging of the abdomen and pelvis was performed following the standard protocol without IV contrast. RADIATION DOSE REDUCTION: This exam was performed according to the departmental dose-optimization program which includes automated exposure control, adjustment of the mA and/or kV according to patient size and/or use of iterative reconstruction technique. COMPARISON:  November 06, 2023. FINDINGS: Lower chest: Minimal bilateral posterior basilar subsegmental atelectasis is noted. Hepatobiliary: No cholelithiasis or biliary dilatation is noted. Nodular hepatic contours are noted suggesting hepatic cirrhosis. Pancreas: Unremarkable. No pancreatic ductal dilatation or surrounding inflammatory changes. Spleen: Normal in size without focal abnormality. Adrenals/Urinary Tract: Adrenal glands are unremarkable. Kidneys are normal, without renal calculi, focal lesion, or hydronephrosis. Bladder is unremarkable. Stomach/Bowel: Stomach is within normal limits. Appendix appears normal. No evidence of bowel wall thickening, distention, or inflammatory changes. Vascular/Lymphatic: Aortic atherosclerosis. No enlarged abdominal or pelvic lymph nodes. Reproductive: Prostate is unremarkable. Other: No abdominal wall hernia or abnormality. No abdominopelvic ascites. Musculoskeletal: No acute or significant osseous findings. IMPRESSION: 1. Nodular hepatic contours are noted suggesting hepatic cirrhosis. 2. No other abnormality seen in the abdomen or  pelvis. 3. Aortic atherosclerosis. Aortic Atherosclerosis (ICD10-I70.0). Electronically Signed   By: Lynwood Landy Raddle M.D.   On: 11/19/2023 15:20    EKG: Independently reviewed. Sinus tachycardia, rate 108.   Assessment/Plan   1. Alcohol withdrawal  - Appreciate PCCM assistance, plan to continue phenobarbital  taper with as-needed Ativan , vitamin supplementation, TOC consultation    2. Chronic abdominal pain  - No acute findings on CT abdomen/pelvis  - Continue pain-control    3. Type II DM  - A1c was 6.8% in August 2025  - Check CBGs and use low-intensity SSI for now    DVT prophylaxis: Lovenox   Code Status: Full  Level of Care: Level of care: Progressive Family Communication: None present  Disposition Plan:  Patient is from: Home  Anticipated d/c is to: TBD Anticipated d/c date is: 11/22/23  Patient currently: Pending treatment of alcohol withdrawal, disposition planning  Consults called: PCCM  Admission status: Inpatient     Evalene GORMAN Sprinkles, MD [facility]  11/19/2023, 9:36 PM

## 2023-11-20 DIAGNOSIS — F10931 Alcohol use, unspecified with withdrawal delirium: Secondary | ICD-10-CM

## 2023-11-20 LAB — URINALYSIS, ROUTINE W REFLEX MICROSCOPIC
Bacteria, UA: NONE SEEN
Bilirubin Urine: NEGATIVE
Glucose, UA: 50 mg/dL — AB
Hgb urine dipstick: NEGATIVE
Ketones, ur: NEGATIVE mg/dL
Leukocytes,Ua: NEGATIVE
Nitrite: NEGATIVE
Protein, ur: 30 mg/dL — AB
Specific Gravity, Urine: 1.017 (ref 1.005–1.030)
pH: 6 (ref 5.0–8.0)

## 2023-11-20 LAB — COMPREHENSIVE METABOLIC PANEL WITH GFR
ALT: 49 U/L — ABNORMAL HIGH (ref 0–44)
AST: 42 U/L — ABNORMAL HIGH (ref 15–41)
Albumin: 3.5 g/dL (ref 3.5–5.0)
Alkaline Phosphatase: 66 U/L (ref 38–126)
Anion gap: 6 (ref 5–15)
BUN: 5 mg/dL — ABNORMAL LOW (ref 6–20)
CO2: 30 mmol/L (ref 22–32)
Calcium: 9 mg/dL (ref 8.9–10.3)
Chloride: 101 mmol/L (ref 98–111)
Creatinine, Ser: 0.83 mg/dL (ref 0.61–1.24)
GFR, Estimated: >60 mL/min (ref >60)
Glucose, Bld: 134 mg/dL — ABNORMAL HIGH (ref 70–99)
Potassium: 3.8 mmol/L (ref 3.5–5.1)
Sodium: 137 mmol/L (ref 135–145)
Total Bilirubin: 0.6 mg/dL (ref 0.0–1.2)
Total Protein: 6.9 g/dL (ref 6.5–8.1)

## 2023-11-20 LAB — CBC
HCT: 42.4 % (ref 39.0–52.0)
Hemoglobin: 14.2 g/dL (ref 13.0–17.0)
MCH: 29.8 pg (ref 26.0–34.0)
MCHC: 33.5 g/dL (ref 30.0–36.0)
MCV: 89.1 fL (ref 80.0–100.0)
Platelets: 276 K/uL (ref 150–400)
RBC: 4.76 MIL/uL (ref 4.22–5.81)
RDW: 16.8 % — ABNORMAL HIGH (ref 11.5–15.5)
WBC: 6.8 K/uL (ref 4.0–10.5)
nRBC: 0 % (ref 0.0–0.2)

## 2023-11-20 LAB — GLUCOSE, CAPILLARY
Glucose-Capillary: 125 mg/dL — ABNORMAL HIGH (ref 70–99)
Glucose-Capillary: 186 mg/dL — ABNORMAL HIGH (ref 70–99)
Glucose-Capillary: 158 mg/dL — ABNORMAL HIGH (ref 70–99)

## 2023-11-20 LAB — CBG MONITORING, ED: Glucose-Capillary: 124 mg/dL — ABNORMAL HIGH (ref 70–99)

## 2023-11-20 MED ORDER — METOPROLOL TARTRATE 5 MG/5ML IV SOLN: 5.0000 mg | INTRAVENOUS | Status: DC | PRN

## 2023-11-20 MED ORDER — IPRATROPIUM-ALBUTEROL 0.5-2.5 (3) MG/3ML IN SOLN: 3.0000 mL | RESPIRATORY_TRACT | Status: DC | PRN

## 2023-11-20 MED ORDER — CEPHALEXIN 500 MG PO CAPS
500.0000 mg | ORAL_CAPSULE | Freq: Three times a day (TID) | ORAL | Status: AC
  Administered 2023-11-20 – 2023-11-24 (×15): 500 mg via ORAL
  Filled 2023-11-20 (×15): qty 1

## 2023-11-20 MED ORDER — GLUCAGON HCL RDNA (DIAGNOSTIC) 1 MG IJ SOLR: 1.0000 mg | INTRAMUSCULAR | Status: DC | PRN

## 2023-11-20 NOTE — Progress Notes (Signed)
 Pt arrived from ED via stretcher.  Walked from doorway to bed with this RN and NT.  Gait unsteady.  Pt refusing bed alarm right away.  This RN reiterated the purpose and importance of the bed alarm and that it is for his safety.  Pt states he knows the drill and will not get up on his own.  Attempting to get admit VS, pt arms shaking, pt states he is not doing it on purpose.  Pt states this prior to this RN saying anything about the tremors.  Pt states he is due for his meds at 11:15.  This RN states that she will take a look at the orders once he is settled in and admitted to the room and placed on tele.

## 2023-11-20 NOTE — Hospital Course (Addendum)
 Brief Narrative:  53 year old with history of pancreatitis, chronic pain, DM 2, alcohol use comes to the hospital with increasing abdominal pain tremor and anxiety.  Patient reportedly woke up on the floor and possibly could have had seizure which she is not sure of.  In the ER he has normal lipase, CT evidence of nodular liver contour otherwise no acute pancreatitis.  Admitted to the hospital for alcohol withdrawal.   Assessment & Plan:  Principal Problem:   Alcohol withdrawal (HCC) Active Problems:   Acute on chronic pancreatitis (HCC)   Non-insulin  dependent type 2 diabetes mellitus (HCC)     Alcohol withdrawal with possible seizures Currently on phenobarbital  taper.  Additionally will get Ativan  as necessary.  Currently no obvious evidence of seizures but does report that he has had them in the past.  Continue thiamine , multivitamin and folic acid .   Chronic abdominal pain  History of chronic pancreatitis - No acute findings on CT abdomen/pelvis.  Lipase level is normal.  Continue daily PPI. - Continue pain-control     Type II DM  - A1c was 6.8% in August 2025  - Check CBGs and use low-intensity SSI for now   B/L le Cellulitis R>L - Multiple scratches bilateral lower extremity.  Questionable bilateral lower extremity cellulitis right greater than left.  Added 5 days of PO keflex .   DVT prophylaxis: Lovenox     Code Status: Full Code Family Communication:   Status is: Inpatient Remains inpatient appropriate because: Continue hospital stay for evaluation of alcohol withdrawal   PT Follow up Recs:   Subjective:  Seen at bedside no new complaints.  Examination:  General exam: Appears calm and comfortable  Respiratory system: Clear to auscultation. Respiratory effort normal. Cardiovascular system: S1 & S2 heard, RRR. No JVD, murmurs, rubs, gallops or clicks. No pedal edema. Gastrointestinal system: Abdomen is nondistended, soft and nontender. No organomegaly or masses  felt. Normal bowel sounds heard. Central nervous system: Alert and oriented. No focal neurological deficits. Extremities: Symmetric 5 x 5 power. Skin: Bilateral lower extremity scratch mark with mild erythema Psychiatry: Judgement and insight appear normal. Mood & affect appropriate.

## 2023-11-20 NOTE — ED Notes (Signed)
 This phlebotomist stuck the patient twice unable to collect morning labs RN aware

## 2023-11-20 NOTE — Discharge Instructions (Addendum)
 Outpatient Substance Abuse  Treatment- uninsured  Narcotics Anonymous 24-HOUR HELPLINE Pre-recorded for Meeting Schedules [facility] [phone]  [email or web address] ALCOHOLICS ANONYMOUS  [place]   Answering Service [phone] Please Note: All [place] Meetings are Non-smoking [email or web address]  Alcohol and Drug Services -  Insurance: Medicaid /State funding/private insurance Methadone, suboxone/Intensive outpatient  [place]   [phone] Fax: [phone] [address] [place], [place], [address] [place] [phone] Fax: [phone]    [address] [place], [place], [address] ([address] [place], [place], [place], [place], [place], [place], [place], [place]) [facility] [email or web address] Accepts State funding/Medicaid Transitional housing, Intensive Outpatient Treatment, Outpatient treatment, Veterans Services  Phone: [phone] Fax: [phone] Address: [address] [place] [address]   [employer] [employer] [facility] ([email or web address]/) Insurance: Medicaid Case Management, Administrator, arts, Medication Management, Outpatient Therapy, Psychosocial Rehabilitation, Substance Abuse Intensive Outpatient  Phone: [phone] Fax: [phone] [address], [place], [place], [address]  [facility] Medicaid, most private insurance providers Types of Program: Individual/Group Therapy, Substance Abuse Treatment  Phone: [place] [phone] Fax: [phone] [address], [place], [place], [address] [place] [phone] [address] [place], [place], [address]  [facility]  Medicaid Types of Program: [facility]  Phone: [phone] Fax: [phone] [address] [place], [place], [place], [address] [facility] Medicaid/state funds Crisis line [phone] [place] [phone] [place] [phone]  [place] [phone]  [facility] [address];  [place], [place] [address] [phone]  Substance Abuse Intensive Outpatient Program [facility] [address] [place], [place] [address] [phone]- Substance abuse treatment  Successful Transitions  Insurance: [facility], [address], sliding scale Types of Program: substance abuse treatment, transportation assistance Phone: [phone] Fax: [phone] Address: [address], [place] [address] [facility] ([email or web address]) Insurance: UHC, [place], [place], [place] Program: addiction counseling, detoxification,  Phone: [phone]  Fax: [phone] Address: [address] [place], [place] [address]  [facility] (statewide facilities/programs) [address] (Medicaid/state funds) [place], [place] [address]                      [email or web address]/ [phone] [place]- [phone] [place]- [phone] [facility] [place] (2 Locations) (Medicaid/state funds) --[address]  walk in 8:30-12 and 1-2:30 [place], [id number]   [facility]- [phone] --[address] [place], [place] [address]  [phone] walk in 8:30-12 and 2-3:30  [facility] state funds/medicaid [address] [place], [place] [address] [phone] [facility] (Suboxone clinic) Medicaid/state funds  [address]  [place], [place] [address] [phone]   [facility]  [address], [place], [place] [address]  [phone] (24 hours) [place]- [address] [place], [place] [address]  [phone] (24 hours) [place]- [address] [place] [phone] [place]- [address] [place] [phone] [place] [address] [place] [phone] [facility]- Medicaid and state funds  [place]- [address] [place], [place] [address] [phone] (24 hours) [place]- [address] [place],  [place] [address] [phone] [facility]- [address] [place], [place] [address] [phone] (24 hours) [place] [address] [place], [place]  [address] [phone] [place]- [address] [place] [phone]    Do not drive, operate heavy machinery, perform activities at heights, swimming or participation in water activities or provide baby sitting services until you have seen by Primary MD or a Neurologist and advised to do so again.   Follow with Primary MD  in 7 days   Get CBC, CMP, Magnesium , 2 view Chest X ray -  checked next visit with your primary MD   Activity: As tolerated with Full fall precautions use walker/cane & assistance as needed  Disposition Home    Diet: Heart Healthy   Special Instructions: If you have smoked or chewed Tobacco  in the last 2 yrs please stop smoking, stop any regular Alcohol  and or any Recreational drug use.  On your next visit with your primary care physician please Get Medicines reviewed and adjusted.  Please request your Prim.MD to go over all Hospital Tests and Procedure/Radiological results at the follow up, please get all Hospital records sent to your Prim MD by signing hospital release before you go home.  If you experience worsening of your admission symptoms, develop shortness of breath, life threatening emergency, suicidal or homicidal thoughts you must seek medical attention immediately by calling 911 or calling your MD immediately  if symptoms less severe.  You Must read complete instructions/literature along with all the possible adverse reactions/side effects for all the Medicines you take and that have been prescribed to you. Take any new Medicines after you have completely understood and accpet all the possible adverse reactions/side effects.   Do not drive when taking Pain medications.  Do not take more than prescribed Pain, Sleep and Anxiety Medications  Wear Seat belts while driving.     Do not drive, operate heavy  machinery, perform activities at heights, swimming or participation in water activities or provide baby sitting services until you have seen by Primary MD or a Neurologist and advised to do so again.

## 2023-11-20 NOTE — Progress Notes (Signed)
 PROGRESS NOTE    Kristopher [name]  FMW:[id number] DOB: 08-26-1970 DOA: 11/19/2023 PCP: Patient, No Pcp Per    Brief Narrative:  53 year old with history of pancreatitis, chronic pain, DM 2, alcohol use comes to the hospital with increasing abdominal pain tremor and anxiety.  Patient reportedly woke up on the floor and possibly could have had seizure which she is not sure of.  In the ER he has normal lipase, CT evidence of nodular liver contour otherwise no acute pancreatitis.  Admitted to the hospital for alcohol withdrawal.   Assessment & Plan:  Principal Problem:   Alcohol withdrawal (HCC) Active Problems:   Acute on chronic pancreatitis (HCC)   Non-insulin  dependent type 2 diabetes mellitus (HCC)     Alcohol withdrawal with possible seizures Currently on phenobarbital  taper.  Additionally will get Ativan  as necessary.  Currently no obvious evidence of seizures but does report that he has had them in the past.  Continue thiamine , multivitamin and folic acid .   Chronic abdominal pain  History of chronic pancreatitis - No acute findings on CT abdomen/pelvis.  Lipase level is normal.  Continue daily PPI. - Continue pain-control     Type II DM  - A1c was 6.8% in August 2025  - Check CBGs and use low-intensity SSI for now   B/L le Cellulitis R>L - Multiple scratches bilateral lower extremity.  Questionable bilateral lower extremity cellulitis right greater than left.  Added 5 days of PO keflex .   DVT prophylaxis: Lovenox     Code Status: Full Code Family Communication:   Status is: Inpatient Remains inpatient appropriate because: Continue hospital stay for evaluation of alcohol withdrawal   PT Follow up Recs:   Subjective:  Seen at bedside no new complaints.  Examination:  General exam: Appears calm and comfortable  Respiratory system: Clear to auscultation. Respiratory effort normal. Cardiovascular system: S1 & S2 heard, RRR. No JVD, murmurs, rubs, gallops or  clicks. No pedal edema. Gastrointestinal system: Abdomen is nondistended, soft and nontender. No organomegaly or masses felt. Normal bowel sounds heard. Central nervous system: Alert and oriented. No focal neurological deficits. Extremities: Symmetric 5 x 5 power. Skin: Bilateral lower extremity scratch mark with mild erythema Psychiatry: Judgement and insight appear normal. Mood & affect appropriate.                Diet Orders (From admission, onward)     Start     Ordered   11/19/23 1946  Diet regular Room service appropriate? Yes; Fluid consistency: Thin  Diet effective now       Question Answer Comment  Room service appropriate? Yes   Fluid consistency: Thin      11/19/23 1946            Objective: Vitals:   11/20/23 0729 11/20/23 0730 11/20/23 0905 11/20/23 1100  BP: 139/87  125/74   Pulse: 87  85   Resp:   15   Temp:  98.1 F (36.7 C)  97.8 F (36.6 C)  TempSrc:  Oral  Oral  SpO2:   96%   Weight:      Height:        Intake/Output Summary (Last 24 hours) at 11/20/2023 1144 Last data filed at 11/19/2023 1450 Gross per 24 hour  Intake 1000.05 ml  Output --  Net 1000.05 ml   Filed Weights   11/19/23 1134  Weight: 118.4 kg    Scheduled Meds:  carvedilol   6.25 mg Oral BID WC   cephALEXin   500  mg Oral Q8H   enoxaparin  (LOVENOX ) injection  40 mg Subcutaneous Q24H   folic acid   1 mg Oral Daily   insulin  aspart  0-5 Units Subcutaneous QHS   insulin  aspart  0-6 Units Subcutaneous TID WC   lactulose   30 g Oral TID   LORazepam   0-4 mg Oral Q4H   Followed by   NOREEN ON 11/21/2023] LORazepam   0-4 mg Oral Q8H   multivitamin with minerals  1 tablet Oral Daily   pantoprazole   40 mg Oral Daily   PHENObarbital   97.5 mg Intravenous Q8H   Followed by   NOREEN ON 11/21/2023] PHENObarbital   65 mg Intravenous Q8H   Followed by   [START ON 11/23/2023] PHENObarbital   32.5 mg Intravenous Q8H   sodium chloride  flush  3 mL Intravenous Q12H   thiamine   100 mg Oral  Daily   Or   thiamine   100 mg Intravenous Daily   Continuous Infusions:  Nutritional status     Body mass index is 36.4 kg/m.  Data Reviewed:   CBC: Recent Labs  Lab 11/19/23 1135 11/20/23 0627  WBC 8.6 6.8  HGB 15.2 14.2  HCT 44.5 42.4  MCV 87.3 89.1  PLT 353 276   Basic Metabolic Panel: Recent Labs  Lab 11/19/23 1135 11/19/23 1136 11/20/23 0627  NA 138  --  137  K 3.7  --  3.8  CL 100  --  101  CO2 23  --  30  GLUCOSE 98  --  134*  BUN <5*  --  5*  CREATININE 0.73  --  0.83  CALCIUM 9.2  --  9.0  MG  --  2.1  --    GFR: Estimated Creatinine Clearance: 136.2 mL/min (by C-G formula based on SCr of 0.83 mg/dL). Liver Function Tests: Recent Labs  Lab 11/19/23 1135 11/20/23 0627  AST 50* 42*  ALT 56* 49*  ALKPHOS 69 66  BILITOT 0.5 0.6  PROT 6.8 6.9  ALBUMIN 4.0 3.5   Recent Labs  Lab 11/19/23 1135  LIPASE 36   No results for input(s): AMMONIA in the last 168 hours. Coagulation Profile: No results for input(s): INR, PROTIME in the last 168 hours. Cardiac Enzymes: No results for input(s): CKTOTAL, CKMB, CKMBINDEX, TROPONINI in the last 168 hours. BNP (last 3 results) No results for input(s): PROBNP in the last 8760 hours. HbA1C: No results for input(s): HGBA1C in the last 72 hours. CBG: Recent Labs  Lab 11/19/23 2201 11/20/23 0743 11/20/23 1126  GLUCAP 130* 124* 125*   Lipid Profile: No results for input(s): CHOL, HDL, LDLCALC, TRIG, CHOLHDL, LDLDIRECT in the last 72 hours. Thyroid  Function Tests: No results for input(s): TSH, T4TOTAL, FREET4, T3FREE, THYROIDAB in the last 72 hours. Anemia Panel: No results for input(s): VITAMINB12, FOLATE, FERRITIN, TIBC, IRON, RETICCTPCT in the last 72 hours. Sepsis Labs: No results for input(s): PROCALCITON, LATICACIDVEN in the last 168 hours.  No results found for this or any previous visit (from the past 240 hours).       Radiology  Studies: CT Cervical Spine Wo Contrast Result Date: 11/19/2023 CLINICAL DATA:  Seizure EXAM: CT CERVICAL SPINE WITHOUT CONTRAST TECHNIQUE: Multidetector CT imaging of the cervical spine was performed without intravenous contrast. Multiplanar CT image reconstructions were also generated. RADIATION DOSE REDUCTION: This exam was performed according to the departmental dose-optimization program which includes automated exposure control, adjustment of the mA and/or kV according to patient size and/or use of iterative reconstruction technique. COMPARISON:  None Available. FINDINGS:  No significant alignment abnormality. The craniocervical junction is normal. The atlantoaxial articulation is normal. The dens is normal. There is no fracture identified. Other comments: There is degenerative disc disease at C4-5, C5-6 and C6-7. No significant facet disease. IMPRESSION: Degenerative disc disease at C4-C7. No fracture Electronically Signed   By: Nancyann Burns M.D.   On: 11/19/2023 15:27   CT Head Wo Contrast Result Date: 11/19/2023 CLINICAL DATA:  Seizure EXAM: CT HEAD WITHOUT CONTRAST TECHNIQUE: Contiguous axial images were obtained from the base of the skull through the vertex without intravenous contrast. RADIATION DOSE REDUCTION: This exam was performed according to the departmental dose-optimization program which includes automated exposure control, adjustment of the mA and/or kV according to patient size and/or use of iterative reconstruction technique. COMPARISON:  11/06/2023 FINDINGS: CT HEAD: Attenuation in the brain parenchyma is normal. There is no hemorrhage. No acute ischemic changes. No mass lesion. The ventricles are normal. Skull/sinuses/orbits: No significant abnormality. IMPRESSION: Normal Electronically Signed   By: Nancyann Burns M.D.   On: 11/19/2023 15:25   CT ABDOMEN PELVIS WO CONTRAST Result Date: 11/19/2023 CLINICAL DATA:  Abdominal pain.  History of pancreatitis. EXAM: CT ABDOMEN AND PELVIS WITHOUT  CONTRAST TECHNIQUE: Multidetector CT imaging of the abdomen and pelvis was performed following the standard protocol without IV contrast. RADIATION DOSE REDUCTION: This exam was performed according to the departmental dose-optimization program which includes automated exposure control, adjustment of the mA and/or kV according to patient size and/or use of iterative reconstruction technique. COMPARISON:  November 06, 2023. FINDINGS: Lower chest: Minimal bilateral posterior basilar subsegmental atelectasis is noted. Hepatobiliary: No cholelithiasis or biliary dilatation is noted. Nodular hepatic contours are noted suggesting hepatic cirrhosis. Pancreas: Unremarkable. No pancreatic ductal dilatation or surrounding inflammatory changes. Spleen: Normal in size without focal abnormality. Adrenals/Urinary Tract: Adrenal glands are unremarkable. Kidneys are normal, without renal calculi, focal lesion, or hydronephrosis. Bladder is unremarkable. Stomach/Bowel: Stomach is within normal limits. Appendix appears normal. No evidence of bowel wall thickening, distention, or inflammatory changes. Vascular/Lymphatic: Aortic atherosclerosis. No enlarged abdominal or pelvic lymph nodes. Reproductive: Prostate is unremarkable. Other: No abdominal wall hernia or abnormality. No abdominopelvic ascites. Musculoskeletal: No acute or significant osseous findings. IMPRESSION: 1. Nodular hepatic contours are noted suggesting hepatic cirrhosis. 2. No other abnormality seen in the abdomen or pelvis. 3. Aortic atherosclerosis. Aortic Atherosclerosis (ICD10-I70.0). Electronically Signed   By: Lynwood Landy Raddle M.D.   On: 11/19/2023 15:20           LOS: 1 day   Time spent= 35 mins    Burgess JAYSON Dare, MD [facility]  If 7PM-7AM, please contact night-coverage  11/20/2023, 11:44 AM

## 2023-11-20 NOTE — Progress Notes (Signed)
 CSW added substance abuse resources to patient's AVS.  Niels Portugal, MSW, LCSW Transitions of Care  Clinical Social Worker II [phone]

## 2023-11-20 NOTE — Plan of Care (Signed)
  Problem: Coping: Goal: Ability to adjust to condition or change in health will improve Outcome: Progressing   Problem: Fluid Volume: Goal: Ability to maintain a balanced intake and output will improve Outcome: Progressing   Problem: Health Behavior/Discharge Planning: Goal: Ability to manage health-related needs will improve Outcome: Progressing   Problem: Metabolic: Goal: Ability to maintain appropriate glucose levels will improve Outcome: Progressing   Problem: Nutritional: Goal: Maintenance of adequate nutrition will improve Outcome: Progressing   Problem: Skin Integrity: Goal: Risk for impaired skin integrity will decrease Outcome: Progressing   Problem: Tissue Perfusion: Goal: Adequacy of tissue perfusion will improve Outcome: Progressing   Problem: Education: Goal: Knowledge of General Education information will improve Description: Including pain rating scale, medication(s)/side effects and non-pharmacologic comfort measures Outcome: Progressing   Problem: Health Behavior/Discharge Planning: Goal: Ability to manage health-related needs will improve Outcome: Progressing   Problem: Clinical Measurements: Goal: Ability to maintain clinical measurements within normal limits will improve Outcome: Progressing Goal: Will remain free from infection Outcome: Progressing Goal: Diagnostic test results will improve Outcome: Progressing Goal: Respiratory complications will improve Outcome: Progressing Goal: Cardiovascular complication will be avoided Outcome: Progressing   Problem: Activity: Goal: Risk for activity intolerance will decrease Outcome: Progressing

## 2023-11-21 DIAGNOSIS — F10939 Alcohol use, unspecified with withdrawal, unspecified: Secondary | ICD-10-CM | POA: Diagnosis not present

## 2023-11-21 LAB — GLUCOSE, CAPILLARY
Glucose-Capillary: 162 mg/dL — ABNORMAL HIGH (ref 70–99)
Glucose-Capillary: 150 mg/dL — ABNORMAL HIGH (ref 70–99)
Glucose-Capillary: 166 mg/dL — ABNORMAL HIGH (ref 70–99)
Glucose-Capillary: 161 mg/dL — ABNORMAL HIGH (ref 70–99)

## 2023-11-21 LAB — PHOSPHORUS: Phosphorus: 3.9 mg/dL (ref 2.5–4.6)

## 2023-11-21 LAB — MAGNESIUM: Magnesium: 1.9 mg/dL (ref 1.7–2.4)

## 2023-11-21 MED ORDER — HYDRALAZINE HCL 20 MG/ML IJ SOLN: 10.0000 mg | Freq: Four times a day (QID) | INTRAMUSCULAR | Status: DC | PRN

## 2023-11-21 MED ORDER — CHLORDIAZEPOXIDE HCL 5 MG PO CAPS
20.0000 mg | ORAL_CAPSULE | Freq: Three times a day (TID) | ORAL | Status: DC
  Administered 2023-11-21 (×3): 20 mg via ORAL
  Filled 2023-11-21 (×3): qty 4

## 2023-11-21 MED ORDER — POTASSIUM CHLORIDE CRYS ER 20 MEQ PO TBCR
20.0000 meq | EXTENDED_RELEASE_TABLET | Freq: Once | ORAL | Status: AC
  Administered 2023-11-21: 20 meq via ORAL
  Filled 2023-11-21: qty 1

## 2023-11-21 MED ORDER — ENSURE PLUS HIGH PROTEIN PO LIQD
237.0000 mL | Freq: Two times a day (BID) | ORAL | Status: DC
  Administered 2023-11-21 – 2023-11-24 (×7): 237 mL via ORAL

## 2023-11-21 NOTE — Progress Notes (Signed)
 PROGRESS NOTE                                                                                                                                                                                                             Patient Demographics:    Kristopher [name], is a 53 y.o. male, DOB - 1971/03/07, FMW:[id number]  Outpatient Primary MD for the patient is Patient, No Pcp Per    LOS - 2  Admit date - 11/19/2023    Chief Complaint  Patient presents with   Seizures       Brief Narrative (HPI from H&P)   54 y.o. male with medical history significant for pancreatitis, chronic pain, type 2 diabetes mellitus, and alcohol abuse who presents with increased abdominal pain, tremors, anxiety, and possible seizure.  Of note patient was admitted few weeks ago for alcohol withdrawal, he now presented to the hospital with abdominal pain and finding himself on the floor of the house, does not know what happened.  In the ER he was diagnosed with DTs, CT head and C-spine nonacute he was admitted for further care.   Subjective:    Kristopher [name] today has, No headache, No chest pain, No abdominal pain - No Nausea, No new weakness tingling or numbness, no shortness of breath.   Assessment  & Plan :    Severe DTs.  Severe ongoing alcohol abuse he drinks about 20 bottles of beer a day if not more, he was recently admitted few weeks ago for similar issue.  Again he has been strictly counseled to abstain from alcohol, currently on Librium , phenobarb and CIWA protocol, continue to monitor closely.   ?? Passing out at home.  Likely due to alcohol withdrawal, seizure cannot be ruled out, CT head and C-spine unremarkable get EEG.  Management unlikely to change in the setting of ongoing alcohol abuse.  Chronic abdominal pain.  Much improved.  No acute findings on CT abdomen pelvis, lipase stable, supportive care he chronically uses  narcotics.  Hypertension.  On Coreg  added as needed hydralazine  monitor.  History of right arm SVT.  Avoid IV line placement in the right arm.   Obesity with BMI of 38.  Follow with PCP for weight loss.   Alcoholic cirrhosis on CT.  Follow-up with PCP outpatient.  Quit alcohol.    DM type II.  Sliding scale.  CBG (last 3)  Recent Labs    11/20/23 1527 11/20/23 2045 11/21/23 0741  GLUCAP 186* 158* 162*         Condition -fair  Family Communication  : None present  Code Status : Full code  Consults  : None  PUD Prophylaxis :     Procedures  :     CT head and C-spine.  Nonacute.    CT abdomen pelvis.  Cirrhotic liver.      Disposition Plan  :    Status is: Inpatient   DVT Prophylaxis  :    enoxaparin  (LOVENOX ) injection 40 mg Start: 11/19/23 2000    Lab Results  Component Value Date   PLT 276 11/20/2023    Diet :  Diet Order             Diet regular Room service appropriate? Yes; Fluid consistency: Thin  Diet effective now                    Inpatient Medications  Scheduled Meds:  carvedilol   6.25 mg Oral BID WC   cephALEXin   500 mg Oral Q8H   chlordiazePOXIDE   20 mg Oral TID   enoxaparin  (LOVENOX ) injection  40 mg Subcutaneous Q24H   folic acid   1 mg Oral Daily   insulin  aspart  0-5 Units Subcutaneous QHS   insulin  aspart  0-6 Units Subcutaneous TID WC   lactulose   30 g Oral TID   LORazepam   0-4 mg Oral Q4H   Followed by   LORazepam   0-4 mg Oral Q8H   multivitamin with minerals  1 tablet Oral Daily   pantoprazole   40 mg Oral Daily   PHENObarbital   97.5 mg Intravenous Q8H   Followed by   PHENObarbital   65 mg Intravenous Q8H   Followed by   [START ON 11/23/2023] PHENObarbital   32.5 mg Intravenous Q8H   potassium chloride   20 mEq Oral Once   sodium chloride  flush  3 mL Intravenous Q12H   thiamine   100 mg Oral Daily   Or   thiamine   100 mg Intravenous Daily   Continuous Infusions: PRN Meds:.acetaminophen  **OR** acetaminophen ,  glucagon  (human recombinant), hydrALAZINE , ipratropium-albuterol , LORazepam  **OR** LORazepam , methocarbamol , metoprolol  tartrate, oxyCODONE , prochlorperazine   Antibiotics  :    Anti-infectives (From admission, onward)    Start     Dose/Rate Route Frequency Ordered Stop   11/20/23 0945  cephALEXin  (KEFLEX ) capsule 500 mg        500 mg Oral Every 8 hours 11/20/23 0940 11/25/23 0559         Objective:   Vitals:   11/21/23 0307 11/21/23 0310 11/21/23 0400 11/21/23 0738  BP: (!) 151/117 (!) 146/85  (!) 147/78  Pulse:  (!) 102 90 87  Resp:  17  16  Temp:  98 F (36.7 C)  97.9 F (36.6 C)  TempSrc:  Oral  Oral  SpO2:  93%  94%  Weight:      Height:        Wt Readings from Last 3 Encounters:  11/19/23 118.4 kg  09/19/23 124.3 kg  08/27/22 117.9 kg     Intake/Output Summary (Last 24 hours) at 11/21/2023 0823 Last data filed at 11/21/2023 0100 Gross per 24 hour  Intake 480 ml  Output 650 ml  Net -170 ml     Physical Exam  Awake Alert, No new F.N deficits, mild DTs, note he has chronic tremors [place].AT,PERRAL Supple Neck, No JVD,  Symmetrical Chest wall movement, Good air movement bilaterally, CTAB RRR,No Gallops,Rubs or new Murmurs,  +ve B.Sounds, Abd Soft, No tenderness,   No Cyanosis, Clubbing or edema       Data Review:    Recent Labs  Lab 11/19/23 1135 11/20/23 0627  WBC 8.6 6.8  HGB 15.2 14.2  HCT 44.5 42.4  PLT 353 276  MCV 87.3 89.1  MCH 29.8 29.8  MCHC 34.2 33.5  RDW 16.3* 16.8*    Recent Labs  Lab 11/19/23 1135 11/19/23 1136 11/20/23 0627 11/21/23 0622  NA 138  --  137  --   K 3.7  --  3.8  --   CL 100  --  101  --   CO2 23  --  30  --   ANIONGAP 15  --  6  --   GLUCOSE 98  --  134*  --   BUN <5*  --  5*  --   CREATININE 0.73  --  0.83  --   AST 50*  --  42*  --   ALT 56*  --  49*  --   ALKPHOS 69  --  66  --   BILITOT 0.5  --  0.6  --   ALBUMIN 4.0  --  3.5  --   MG  --  2.1  --  1.9  PHOS  --   --   --  3.9  CALCIUM 9.2  --   9.0  --       Recent Labs  Lab 11/19/23 1135 11/19/23 1136 11/20/23 0627 11/21/23 0622  MG  --  2.1  --  1.9  CALCIUM 9.2  --  9.0  --     --------------------------------------------------------------------------------------------------------------- Lab Results  Component Value Date   CHOL 184 12/12/2022   HDL 37 (L) 12/12/2022   LDLCALC 97 12/12/2022   TRIG 250 (H) 12/12/2022   CHOLHDL 5.0 12/12/2022    Lab Results  Component Value Date   HGBA1C 6.8 (H) 11/06/2023    Radiology Report CT Cervical Spine Wo Contrast Result Date: 11/19/2023 CLINICAL DATA:  Seizure EXAM: CT CERVICAL SPINE WITHOUT CONTRAST TECHNIQUE: Multidetector CT imaging of the cervical spine was performed without intravenous contrast. Multiplanar CT image reconstructions were also generated. RADIATION DOSE REDUCTION: This exam was performed according to the departmental dose-optimization program which includes automated exposure control, adjustment of the mA and/or kV according to patient size and/or use of iterative reconstruction technique. COMPARISON:  None Available. FINDINGS: No significant alignment abnormality. The craniocervical junction is normal. The atlantoaxial articulation is normal. The dens is normal. There is no fracture identified. Other comments: There is degenerative disc disease at C4-5, C5-6 and C6-7. No significant facet disease. IMPRESSION: Degenerative disc disease at C4-C7. No fracture Electronically Signed   By: Nancyann Burns M.D.   On: 11/19/2023 15:27   CT Head Wo Contrast Result Date: 11/19/2023 CLINICAL DATA:  Seizure EXAM: CT HEAD WITHOUT CONTRAST TECHNIQUE: Contiguous axial images were obtained from the base of the skull through the vertex without intravenous contrast. RADIATION DOSE REDUCTION: This exam was performed according to the departmental dose-optimization program which includes automated exposure control, adjustment of the mA and/or kV according to patient size and/or use of  iterative reconstruction technique. COMPARISON:  11/06/2023 FINDINGS: CT HEAD: Attenuation in the brain parenchyma is normal. There is no hemorrhage. No acute ischemic changes. No mass lesion. The ventricles are normal. Skull/sinuses/orbits: No significant abnormality. IMPRESSION: Normal Electronically Signed   By: Nancyann  Heck M.D.   On: 11/19/2023 15:25   CT ABDOMEN PELVIS WO CONTRAST Result Date: 11/19/2023 CLINICAL DATA:  Abdominal pain.  History of pancreatitis. EXAM: CT ABDOMEN AND PELVIS WITHOUT CONTRAST TECHNIQUE: Multidetector CT imaging of the abdomen and pelvis was performed following the standard protocol without IV contrast. RADIATION DOSE REDUCTION: This exam was performed according to the departmental dose-optimization program which includes automated exposure control, adjustment of the mA and/or kV according to patient size and/or use of iterative reconstruction technique. COMPARISON:  November 06, 2023. FINDINGS: Lower chest: Minimal bilateral posterior basilar subsegmental atelectasis is noted. Hepatobiliary: No cholelithiasis or biliary dilatation is noted. Nodular hepatic contours are noted suggesting hepatic cirrhosis. Pancreas: Unremarkable. No pancreatic ductal dilatation or surrounding inflammatory changes. Spleen: Normal in size without focal abnormality. Adrenals/Urinary Tract: Adrenal glands are unremarkable. Kidneys are normal, without renal calculi, focal lesion, or hydronephrosis. Bladder is unremarkable. Stomach/Bowel: Stomach is within normal limits. Appendix appears normal. No evidence of bowel wall thickening, distention, or inflammatory changes. Vascular/Lymphatic: Aortic atherosclerosis. No enlarged abdominal or pelvic lymph nodes. Reproductive: Prostate is unremarkable. Other: No abdominal wall hernia or abnormality. No abdominopelvic ascites. Musculoskeletal: No acute or significant osseous findings. IMPRESSION: 1. Nodular hepatic contours are noted suggesting hepatic  cirrhosis. 2. No other abnormality seen in the abdomen or pelvis. 3. Aortic atherosclerosis. Aortic Atherosclerosis (ICD10-I70.0). Electronically Signed   By: Lynwood Landy Raddle M.D.   On: 11/19/2023 15:20     Signature  -   Lavada Stank M.D on 11/21/2023 at 8:23 AM   -  To page go to [email or web address]

## 2023-11-21 NOTE — Plan of Care (Signed)
  Problem: Clinical Measurements: Goal: Respiratory complications will improve Outcome: Progressing Goal: Cardiovascular complication will be avoided Outcome: Progressing   Problem: Clinical Measurements: Goal: Cardiovascular complication will be avoided Outcome: Progressing   Problem: Activity: Goal: Risk for activity intolerance will decrease Outcome: Progressing   Problem: Pain Managment: Goal: General experience of comfort will improve and/or be controlled Outcome: Progressing

## 2023-11-21 NOTE — Plan of Care (Signed)
   Problem: Coping: Goal: Ability to adjust to condition or change in health will improve Outcome: Progressing   Problem: Skin Integrity: Goal: Risk for impaired skin integrity will decrease Outcome: Progressing

## 2023-11-22 DIAGNOSIS — F10939 Alcohol use, unspecified with withdrawal, unspecified: Secondary | ICD-10-CM | POA: Diagnosis not present

## 2023-11-22 LAB — GLUCOSE, CAPILLARY
Glucose-Capillary: 162 mg/dL — ABNORMAL HIGH (ref 70–99)
Glucose-Capillary: 159 mg/dL — ABNORMAL HIGH (ref 70–99)
Glucose-Capillary: 152 mg/dL — ABNORMAL HIGH (ref 70–99)
Glucose-Capillary: 175 mg/dL — ABNORMAL HIGH (ref 70–99)

## 2023-11-22 LAB — COMPREHENSIVE METABOLIC PANEL WITH GFR
ALT: 55 U/L — ABNORMAL HIGH (ref 0–44)
AST: 52 U/L — ABNORMAL HIGH (ref 15–41)
Albumin: 3.2 g/dL — ABNORMAL LOW (ref 3.5–5.0)
Alkaline Phosphatase: 62 U/L (ref 38–126)
Anion gap: 11 (ref 5–15)
BUN: 11 mg/dL (ref 6–20)
CO2: 24 mmol/L (ref 22–32)
Calcium: 8.8 mg/dL — ABNORMAL LOW (ref 8.9–10.3)
Chloride: 101 mmol/L (ref 98–111)
Creatinine, Ser: 0.87 mg/dL (ref 0.61–1.24)
GFR, Estimated: >60 mL/min (ref >60)
Glucose, Bld: 128 mg/dL — ABNORMAL HIGH (ref 70–99)
Potassium: 3.6 mmol/L (ref 3.5–5.1)
Sodium: 136 mmol/L (ref 135–145)
Total Bilirubin: 0.6 mg/dL (ref 0.0–1.2)
Total Protein: 6.4 g/dL — ABNORMAL LOW (ref 6.5–8.1)

## 2023-11-22 LAB — CBC
HCT: 39.3 % (ref 39.0–52.0)
Hemoglobin: 13 g/dL (ref 13.0–17.0)
MCH: 30 pg (ref 26.0–34.0)
MCHC: 33.1 g/dL (ref 30.0–36.0)
MCV: 90.8 fL (ref 80.0–100.0)
Platelets: 238 K/uL (ref 150–400)
RBC: 4.33 MIL/uL (ref 4.22–5.81)
RDW: 16.4 % — ABNORMAL HIGH (ref 11.5–15.5)
WBC: 7.6 K/uL (ref 4.0–10.5)
nRBC: 0 % (ref 0.0–0.2)

## 2023-11-22 LAB — MAGNESIUM: Magnesium: 1.8 mg/dL (ref 1.7–2.4)

## 2023-11-22 MED ORDER — CHLORDIAZEPOXIDE HCL 5 MG PO CAPS
10.0000 mg | ORAL_CAPSULE | Freq: Three times a day (TID) | ORAL | Status: DC
  Administered 2023-11-22 – 2023-11-23 (×6): 10 mg via ORAL
  Filled 2023-11-22 (×6): qty 2

## 2023-11-22 NOTE — Progress Notes (Signed)
 PROGRESS NOTE                                                                                                                                                                                                             Kristopher [name] Demographics:    Kristopher [name], is a 53 y.o. male, DOB - [birth year], FMW:[id number]  Outpatient Primary MD for the Kristopher [name] is Kristopher [name], No Pcp Per    LOS - 3  Admit date - 11/19/2023    Chief Complaint  Kristopher [name] presents with   Seizures       Brief Narrative (HPI from H&P)   53 y.o. male with medical history significant for pancreatitis, chronic pain, type 2 diabetes mellitus, and alcohol abuse who presents with increased abdominal pain, tremors, anxiety, and possible seizure.  Of note Kristopher [name] was admitted few weeks ago for alcohol withdrawal, he now presented to the hospital with abdominal pain and finding himself on the floor of the house, does not know what happened.  In the ER he was diagnosed with DTs, CT head and C-spine nonacute he was admitted for further care.   Subjective:   Kristopher [name] in bed, appears comfortable, denies any headache, no fever, no chest pain or pressure, no shortness of breath , no abdominal pain. No new focal weakness.    Assessment  & Plan :    Severe DTs.  Severe ongoing alcohol abuse he drinks about 20 bottles of beer a day if not more, he was recently admitted few weeks ago for similar issue.  Again he has been strictly counseled to abstain from alcohol, currently on Librium , phenobarb and CIWA protocol, continue to monitor closely.   ?? Passing out at home.  Likely due to alcohol withdrawal, seizure cannot be ruled out, CT head and C-spine unremarkable get EEG.  Management unlikely to change in the setting of ongoing alcohol abuse.  Chronic abdominal pain.  Much improved.  No acute findings on CT abdomen pelvis, lipase stable, supportive care he chronically uses  narcotics.  Hypertension.  On Coreg  added as needed hydralazine  monitor.  History of right arm SVT.  Avoid IV line placement in the right arm.   Obesity with BMI of 38.  Follow with PCP for weight loss.   Alcoholic cirrhosis on CT.  Follow-up with PCP outpatient.  Quit alcohol.    DM  type II.  Sliding scale.  CBG (last 3)  Recent Labs    11/21/23 1615 11/21/23 2138 11/22/23 0746  GLUCAP 166* 161* 162*         Condition -fair  Family Communication  : None present  Code Status : Full code  Consults  : None  PUD Prophylaxis :     Procedures  :     CT head and C-spine.  Nonacute.    CT abdomen pelvis.  Cirrhotic liver.      Disposition Plan  :    Status is: Inpatient   DVT Prophylaxis  :    enoxaparin  (LOVENOX ) injection 40 mg Start: 11/19/23 2000    Lab Results  Component Value Date   PLT 238 11/22/2023    Diet :  Diet Order             Diet regular Room service appropriate? Yes; Fluid consistency: Thin  Diet effective now                    Inpatient Medications  Scheduled Meds:  carvedilol   6.25 mg Oral BID WC   cephALEXin   500 mg Oral Q8H   chlordiazePOXIDE   20 mg Oral TID   enoxaparin  (LOVENOX ) injection  40 mg Subcutaneous Q24H   feeding supplement  237 mL Oral BID BM   folic acid   1 mg Oral Daily   insulin  aspart  0-5 Units Subcutaneous QHS   insulin  aspart  0-6 Units Subcutaneous TID WC   lactulose   30 g Oral TID   LORazepam   0-4 mg Oral Q8H   multivitamin with minerals  1 tablet Oral Daily   pantoprazole   40 mg Oral Daily   PHENObarbital   65 mg Intravenous Q8H   Followed by   [START ON 11/23/2023] PHENObarbital   32.5 mg Intravenous Q8H   sodium chloride  flush  3 mL Intravenous Q12H   thiamine   100 mg Oral Daily   Or   thiamine   100 mg Intravenous Daily   Continuous Infusions: PRN Meds:.acetaminophen  **OR** acetaminophen , glucagon  (human recombinant), hydrALAZINE , ipratropium-albuterol , LORazepam  **OR** LORazepam ,  methocarbamol , metoprolol  tartrate, oxyCODONE , prochlorperazine   Antibiotics  :    Anti-infectives (From admission, onward)    Start     Dose/Rate Route Frequency Ordered Stop   11/20/23 0945  cephALEXin  (KEFLEX ) capsule 500 mg        500 mg Oral Every 8 hours 11/20/23 0940 11/25/23 0559         Objective:   Vitals:   11/22/23 0351 11/22/23 0353 11/22/23 0355 11/22/23 0747  BP: (!) 139/95 (!) 139/95 (!) 139/95 122/72  Pulse: 83 88 80 89  Resp:  17  (!) 25  Temp:  97.6 F (36.4 C)  98.9 F (37.2 C)  TempSrc:  Oral  Oral  SpO2:  95%  (!) 88%  Weight:      Height:        Wt Readings from Last 3 Encounters:  11/19/23 118.4 kg  09/19/23 124.3 kg  08/27/22 117.9 kg     Intake/Output Summary (Last 24 hours) at 11/22/2023 0818 Last data filed at 11/22/2023 0400 Gross per 24 hour  Intake 480 ml  Output --  Net 480 ml     Physical Exam  Awake Alert, No new F.N deficits, mild DTs, note he has chronic tremors [place].AT,PERRAL Supple Neck, No JVD,   Symmetrical Chest wall movement, Good air movement bilaterally, CTAB RRR,No Gallops,Rubs or new Murmurs,  +ve B.Sounds, Abd Soft, No  tenderness,   No Cyanosis, Clubbing or edema       Data Review:    Recent Labs  Lab 11/19/23 1135 11/20/23 0627 11/22/23 0534  WBC 8.6 6.8 7.6  HGB 15.2 14.2 13.0  HCT 44.5 42.4 39.3  PLT 353 276 238  MCV 87.3 89.1 90.8  MCH 29.8 29.8 30.0  MCHC 34.2 33.5 33.1  RDW 16.3* 16.8* 16.4*    Recent Labs  Lab 11/19/23 1135 11/19/23 1136 11/20/23 0627 11/21/23 0622 11/22/23 0534  NA 138  --  137  --  136  K 3.7  --  3.8  --  3.6  CL 100  --  101  --  101  CO2 23  --  30  --  24  ANIONGAP 15  --  6  --  11  GLUCOSE 98  --  134*  --  128*  BUN <5*  --  5*  --  11  CREATININE 0.73  --  0.83  --  0.87  AST 50*  --  42*  --  52*  ALT 56*  --  49*  --  55*  ALKPHOS 69  --  66  --  62  BILITOT 0.5  --  0.6  --  0.6  ALBUMIN 4.0  --  3.5  --  3.2*  MG  --  2.1  --  1.9 1.8  PHOS   --   --   --  3.9  --   CALCIUM 9.2  --  9.0  --  8.8*      Recent Labs  Lab 11/19/23 1135 11/19/23 1136 11/20/23 0627 11/21/23 0622 11/22/23 0534  MG  --  2.1  --  1.9 1.8  CALCIUM 9.2  --  9.0  --  8.8*    --------------------------------------------------------------------------------------------------------------- Lab Results  Component Value Date   CHOL 184 12/12/2022   HDL 37 (L) 12/12/2022   LDLCALC 97 12/12/2022   TRIG 250 (H) 12/12/2022   CHOLHDL 5.0 12/12/2022    Lab Results  Component Value Date   HGBA1C 6.8 (H) 11/06/2023    Radiology Report No results found.    Signature  -   Lavada Stank M.D on 11/22/2023 at 8:18 AM   -  To page go to [email or web address]

## 2023-11-22 NOTE — Plan of Care (Signed)
  Problem: Coping: Goal: Ability to adjust to condition or change in health will improve Outcome: Progressing   Problem: Health Behavior/Discharge Planning: Goal: Ability to manage health-related needs will improve Outcome: Progressing   Problem: Education: Goal: Knowledge of General Education information will improve Description: Including pain rating scale, medication(s)/side effects and non-pharmacologic comfort measures Outcome: Progressing   Problem: Nutrition: Goal: Adequate nutrition will be maintained Outcome: Progressing

## 2023-11-22 NOTE — Plan of Care (Signed)
   Problem: Education: Goal: Ability to describe self-care measures that may prevent or decrease complications (Diabetes Survival Skills Education) will improve Outcome: Progressing   Problem: Metabolic: Goal: Ability to maintain appropriate glucose levels will improve Outcome: Progressing   Problem: Nutritional: Goal: Maintenance of adequate nutrition will improve Outcome: Progressing

## 2023-11-23 ENCOUNTER — Inpatient Hospital Stay (HOSPITAL_COMMUNITY)

## 2023-11-23 DIAGNOSIS — R55 Syncope and collapse: Secondary | ICD-10-CM | POA: Diagnosis not present

## 2023-11-23 DIAGNOSIS — R569 Unspecified convulsions: Secondary | ICD-10-CM | POA: Diagnosis not present

## 2023-11-23 DIAGNOSIS — F10939 Alcohol use, unspecified with withdrawal, unspecified: Secondary | ICD-10-CM | POA: Diagnosis not present

## 2023-11-23 LAB — CBC
HCT: 39.3 % (ref 39.0–52.0)
Hemoglobin: 13 g/dL (ref 13.0–17.0)
MCH: 30.3 pg (ref 26.0–34.0)
MCHC: 33.1 g/dL (ref 30.0–36.0)
MCV: 91.6 fL (ref 80.0–100.0)
Platelets: 229 K/uL (ref 150–400)
RBC: 4.29 MIL/uL (ref 4.22–5.81)
RDW: 16.5 % — ABNORMAL HIGH (ref 11.5–15.5)
WBC: 7.9 K/uL (ref 4.0–10.5)
nRBC: 0 % (ref 0.0–0.2)

## 2023-11-23 LAB — COMPREHENSIVE METABOLIC PANEL WITH GFR
ALT: 63 U/L — ABNORMAL HIGH (ref 0–44)
AST: 56 U/L — ABNORMAL HIGH (ref 15–41)
Albumin: 3.3 g/dL — ABNORMAL LOW (ref 3.5–5.0)
Alkaline Phosphatase: 56 U/L (ref 38–126)
Anion gap: 11 (ref 5–15)
BUN: 11 mg/dL (ref 6–20)
CO2: 23 mmol/L (ref 22–32)
Calcium: 8.8 mg/dL — ABNORMAL LOW (ref 8.9–10.3)
Chloride: 103 mmol/L (ref 98–111)
Creatinine, Ser: 0.69 mg/dL (ref 0.61–1.24)
GFR, Estimated: >60 mL/min (ref >60)
Glucose, Bld: 145 mg/dL — ABNORMAL HIGH (ref 70–99)
Potassium: 3.7 mmol/L (ref 3.5–5.1)
Sodium: 137 mmol/L (ref 135–145)
Total Bilirubin: 0.2 mg/dL (ref 0.0–1.2)
Total Protein: 6.3 g/dL — ABNORMAL LOW (ref 6.5–8.1)

## 2023-11-23 LAB — GLUCOSE, CAPILLARY
Glucose-Capillary: 169 mg/dL — ABNORMAL HIGH (ref 70–99)
Glucose-Capillary: 184 mg/dL — ABNORMAL HIGH (ref 70–99)
Glucose-Capillary: 160 mg/dL — ABNORMAL HIGH (ref 70–99)

## 2023-11-23 LAB — MAGNESIUM: Magnesium: 1.7 mg/dL (ref 1.7–2.4)

## 2023-11-23 MED ORDER — NICOTINE 21 MG/24HR TD PT24
21.0000 mg | MEDICATED_PATCH | Freq: Every day | TRANSDERMAL | Status: DC
  Administered 2023-11-23 – 2023-11-24 (×2): 21 mg via TRANSDERMAL
  Filled 2023-11-23 (×3): qty 1

## 2023-11-23 MED ORDER — PHENOBARBITAL 32.4 MG PO TABS
32.4000 mg | ORAL_TABLET | Freq: Three times a day (TID) | ORAL | Status: DC
  Administered 2023-11-23 – 2023-11-25 (×6): 32.4 mg via ORAL
  Filled 2023-11-23 (×6): qty 1

## 2023-11-23 MED ORDER — PHENOBARBITAL 32.4 MG PO TABS: 32.4000 mg | ORAL_TABLET | Freq: Three times a day (TID) | ORAL | Status: DC

## 2023-11-23 MED ORDER — CLONIDINE HCL 0.2 MG PO TABS
0.2000 mg | ORAL_TABLET | Freq: Three times a day (TID) | ORAL | Status: DC
  Administered 2023-11-23 – 2023-11-25 (×6): 0.2 mg via ORAL
  Filled 2023-11-23 (×6): qty 1

## 2023-11-23 MED ORDER — MAGNESIUM SULFATE 2 GM/50ML IV SOLN
2.0000 g | Freq: Once | INTRAVENOUS | Status: AC
  Administered 2023-11-23: 2 g via INTRAVENOUS
  Filled 2023-11-23: qty 50

## 2023-11-23 NOTE — Progress Notes (Signed)
 PROGRESS NOTE                                                                                                                                                                                                             Patient Demographics:    Kristopher [name], is a 53 y.o. male, DOB - 10/13/1970, FMW:[id number]  Outpatient Primary MD for the patient is Patient, No Pcp Per    LOS - 4  Admit date - 11/19/2023    Chief Complaint  Patient presents with   Seizures       Brief Narrative (HPI from H&P)   53 y.o. male with medical history significant for pancreatitis, chronic pain, type 2 diabetes mellitus, and alcohol abuse who presents with increased abdominal pain, tremors, anxiety, and possible seizure.  Of note patient was admitted few weeks ago for alcohol withdrawal, he now presented to the hospital with abdominal pain and finding himself on the floor of the house, does not know what happened.  In the ER he was diagnosed with DTs, CT head and C-spine nonacute he was admitted for further care.   Subjective:   Patient in bed, appears comfortable, denies any headache, no fever, no chest pain or pressure, no shortness of breath , no abdominal pain. No new focal weakness.   Assessment  & Plan :    Severe DTs.  Severe ongoing alcohol abuse he drinks about 20 bottles of beer a day if not more, he was recently admitted few weeks ago for similar issue.  Again he has been strictly counseled to abstain from alcohol, currently on Librium , phenobarb and CIWA protocol, continue to monitor closely.   ?? Passing out at home.  Likely due to alcohol withdrawal, seizure cannot be ruled out, CT head and C-spine unremarkable get EEG.  Management unlikely to change in the setting of ongoing alcohol abuse.  Chronic abdominal pain.  Much improved.  No acute findings on CT abdomen pelvis, lipase stable, supportive care he chronically uses  narcotics.  Hypertension.  On Coreg  added as needed hydralazine  monitor.  History of right arm SVT.  Avoid IV line placement in the right arm.   Obesity with BMI of 38.  Follow with PCP for weight loss.   Alcoholic cirrhosis on CT.  Follow-up with PCP outpatient.  Quit alcohol.    DM type  II.  Sliding scale.  CBG (last 3)  Recent Labs    11/22/23 1144 11/22/23 1553 11/22/23 1929  GLUCAP 159* 152* 175*         Condition -fair  Family Communication  : None present  Code Status : Full code  Consults  : None  PUD Prophylaxis :     Procedures  :     CT head and C-spine.  Nonacute.    CT abdomen pelvis.  Cirrhotic liver.      Disposition Plan  :    Status is: Inpatient   DVT Prophylaxis  :    enoxaparin  (LOVENOX ) injection 40 mg Start: 11/19/23 2000    Lab Results  Component Value Date   PLT 238 11/22/2023    Diet :  Diet Order             Diet regular Room service appropriate? Yes; Fluid consistency: Thin  Diet effective now                    Inpatient Medications  Scheduled Meds:  carvedilol   6.25 mg Oral BID WC   cephALEXin   500 mg Oral Q8H   chlordiazePOXIDE   10 mg Oral TID   enoxaparin  (LOVENOX ) injection  40 mg Subcutaneous Q24H   feeding supplement  237 mL Oral BID BM   folic acid   1 mg Oral Daily   insulin  aspart  0-5 Units Subcutaneous QHS   insulin  aspart  0-6 Units Subcutaneous TID WC   lactulose   30 g Oral TID   LORazepam   0-4 mg Oral Q8H   multivitamin with minerals  1 tablet Oral Daily   pantoprazole   40 mg Oral Daily   PHENObarbital   65 mg Intravenous Q8H   Followed by   PHENObarbital   32.5 mg Intravenous Q8H   sodium chloride  flush  3 mL Intravenous Q12H   thiamine   100 mg Oral Daily   Or   thiamine   100 mg Intravenous Daily   Continuous Infusions: PRN Meds:.acetaminophen  **OR** acetaminophen , glucagon  (human recombinant), hydrALAZINE , ipratropium-albuterol , methocarbamol , metoprolol  tartrate, oxyCODONE ,  prochlorperazine   Antibiotics  :    Anti-infectives (From admission, onward)    Start     Dose/Rate Route Frequency Ordered Stop   11/20/23 0945  cephALEXin  (KEFLEX ) capsule 500 mg        500 mg Oral Every 8 hours 11/20/23 0940 11/25/23 0559         Objective:   Vitals:   11/22/23 1554 11/22/23 1928 11/22/23 2345 11/23/23 0339  BP: 131/82 (!) 149/85 (!) 140/83 124/71  Pulse: 86     Resp:      Temp: 97.9 F (36.6 C) 98.3 F (36.8 C) 97.9 F (36.6 C) 98.7 F (37.1 C)  TempSrc: Oral Oral Oral Oral  SpO2: 93%     Weight:      Height:        Wt Readings from Last 3 Encounters:  11/19/23 118.4 kg  09/19/23 124.3 kg  08/27/22 117.9 kg    No intake or output data in the 24 hours ending 11/23/23 0725    Physical Exam  Awake Alert, No new F.N deficits, mild DTs, note he has chronic tremors [place].AT,PERRAL Supple Neck, No JVD,   Symmetrical Chest wall movement, Good air movement bilaterally, CTAB RRR,No Gallops,Rubs or new Murmurs,  +ve B.Sounds, Abd Soft, No tenderness,   No Cyanosis, Clubbing or edema       Data Review:    Recent Labs  Lab  11/19/23 1135 11/20/23 0627 11/22/23 0534  WBC 8.6 6.8 7.6  HGB 15.2 14.2 13.0  HCT 44.5 42.4 39.3  PLT 353 276 238  MCV 87.3 89.1 90.8  MCH 29.8 29.8 30.0  MCHC 34.2 33.5 33.1  RDW 16.3* 16.8* 16.4*    Recent Labs  Lab 11/19/23 1135 11/19/23 1136 11/20/23 0627 11/21/23 0622 11/22/23 0534  NA 138  --  137  --  136  K 3.7  --  3.8  --  3.6  CL 100  --  101  --  101  CO2 23  --  30  --  24  ANIONGAP 15  --  6  --  11  GLUCOSE 98  --  134*  --  128*  BUN <5*  --  5*  --  11  CREATININE 0.73  --  0.83  --  0.87  AST 50*  --  42*  --  52*  ALT 56*  --  49*  --  55*  ALKPHOS 69  --  66  --  62  BILITOT 0.5  --  0.6  --  0.6  ALBUMIN 4.0  --  3.5  --  3.2*  MG  --  2.1  --  1.9 1.8  PHOS  --   --   --  3.9  --   CALCIUM 9.2  --  9.0  --  8.8*      Recent Labs  Lab 11/19/23 1135 11/19/23 1136  11/20/23 0627 11/21/23 0622 11/22/23 0534  MG  --  2.1  --  1.9 1.8  CALCIUM 9.2  --  9.0  --  8.8*    --------------------------------------------------------------------------------------------------------------- Lab Results  Component Value Date   CHOL 184 12/12/2022   HDL 37 (L) 12/12/2022   LDLCALC 97 12/12/2022   TRIG 250 (H) 12/12/2022   CHOLHDL 5.0 12/12/2022    Lab Results  Component Value Date   HGBA1C 6.8 (H) 11/06/2023    Radiology Report No results found.    Signature  -   Lavada Stank M.D on 11/23/2023 at 7:25 AM   -  To page go to [email or web address]

## 2023-11-23 NOTE — Procedures (Signed)
 Patient Name: Kristopher [name]  MRN: [id number]  Epilepsy Attending: Arlin MALVA Krebs  Referring Physician/Provider: Dennise Lavada MARLA, MD  Date: 11/23/2023 Duration: 23.27 mins  Patient history: 53yo M with an episode if LOC. EEG to evaluate for seizure  Level of alertness: Awake, asleep  AEDs during EEG study: Phenobarb  Technical aspects: This EEG study was done with scalp electrodes positioned according to the 10-20 International system of electrode placement. Electrical activity was reviewed with band pass filter of 1-70Hz , sensitivity of 7 uV/mm, display speed of 30mm/sec with a 60Hz  notched filter applied as appropriate. EEG data were recorded continuously and digitally stored.  Video monitoring was available and reviewed as appropriate.  Description: The posterior dominant rhythm consists of 9-10 Hz activity of moderate voltage (25-35 uV) seen predominantly in posterior head regions, symmetric and reactive to eye opening and eye closing. Sleep was characterized by vertex waves, sleep spindles (12 to 14 Hz), maximal frontocentral region. Hyperventilation and photic stimulation were not performed.     IMPRESSION: This study is within normal limits. No seizures or epileptiform discharges were seen throughout the recording.  A normal interictal EEG does not exclude the diagnosis of epilepsy.   Lakshmi Sundeen O [place]

## 2023-11-23 NOTE — Progress Notes (Signed)
 Routine EEG completed, results pending Neurology review and interpretation

## 2023-11-23 NOTE — Plan of Care (Signed)

## 2023-11-23 NOTE — Plan of Care (Signed)
   Problem: Coping: Goal: Ability to adjust to condition or change in health will improve Outcome: Progressing

## 2023-11-24 DIAGNOSIS — F10939 Alcohol use, unspecified with withdrawal, unspecified: Secondary | ICD-10-CM | POA: Diagnosis not present

## 2023-11-24 LAB — GLUCOSE, CAPILLARY
Glucose-Capillary: 194 mg/dL — ABNORMAL HIGH (ref 70–99)
Glucose-Capillary: 171 mg/dL — ABNORMAL HIGH (ref 70–99)
Glucose-Capillary: 151 mg/dL — ABNORMAL HIGH (ref 70–99)
Glucose-Capillary: 131 mg/dL — ABNORMAL HIGH (ref 70–99)

## 2023-11-24 MED ORDER — CHLORDIAZEPOXIDE HCL 5 MG PO CAPS
5.0000 mg | ORAL_CAPSULE | Freq: Three times a day (TID) | ORAL | Status: DC
  Administered 2023-11-24 – 2023-11-25 (×4): 5 mg via ORAL
  Filled 2023-11-24 (×4): qty 1

## 2023-11-24 NOTE — Plan of Care (Signed)
   Problem: Nutritional: Goal: Maintenance of adequate nutrition will improve Outcome: Progressing   Problem: Skin Integrity: Goal: Risk for impaired skin integrity will decrease Outcome: Progressing

## 2023-11-24 NOTE — Progress Notes (Addendum)
 PROGRESS NOTE                                                                                                                                                                                                             Patient Demographics:    Kristopher [name], is a 53 y.o. male, DOB - 1970/05/07, FMW:[id number]  Outpatient Primary MD for the patient is Patient, No Pcp Per    LOS - 5  Admit date - 11/19/2023    Chief Complaint  Patient presents with   Seizures       Brief Narrative (HPI from H&P)   53 y.o. male with medical history significant for pancreatitis, chronic pain, type 2 diabetes mellitus, and alcohol abuse who presents with increased abdominal pain, tremors, anxiety, and possible seizure.  Of note patient was admitted few weeks ago for alcohol withdrawal, he now presented to the hospital with abdominal pain and finding himself on the floor of the house, does not know what happened.  In the ER he was diagnosed with DTs, CT head and C-spine nonacute he was admitted for further care.   Subjective:   Patient in bed, appears comfortable, denies any headache, no fever, no chest pain or pressure, no shortness of breath , no abdominal pain. No new focal weakness.   Assessment  & Plan :    Severe DTs.  Severe ongoing alcohol abuse he drinks about 20 bottles of beer a day if not more, he was recently admitted few weeks ago for similar issue.  Again he has been strictly counseled to abstain from alcohol, currently on Librium , phenobarb and CIWA protocol, continue to monitor closely.   ?? Passing out at home.  Likely due to alcohol withdrawal, seizure cannot be ruled out, CT head and C-spine unremarkable stable EEG.  Management unlikely to change in the setting of ongoing alcohol abuse.  Chronic abdominal pain.  Much improved.  No acute findings on CT abdomen pelvis, lipase stable, supportive care he chronically uses  narcotics.  Hypertension.  On Coreg  added as needed hydralazine  monitor.  History of right arm SVT.  Avoid IV line placement in the right arm.   Obesity with BMI of 38.  Follow with PCP for weight loss.   Alcoholic cirrhosis on CT.  Follow-up with PCP outpatient.  Quit alcohol.    DM type  II.  Sliding scale.  CBG (last 3)  Recent Labs    11/23/23 1752 11/23/23 2153 11/24/23 0807  GLUCAP 184* 160* 194*         Condition -fair  Family Communication  : None present  Code Status : Full code  Consults  : None  PUD Prophylaxis :     Procedures  :     EEG - stable  CT head and C-spine.  Nonacute.    CT abdomen pelvis.  Cirrhotic liver.      Disposition Plan  :    Status is: Inpatient   DVT Prophylaxis  :    enoxaparin  (LOVENOX ) injection 40 mg Start: 11/19/23 2000    Lab Results  Component Value Date   PLT 229 11/23/2023    Diet :  Diet Order             Diet regular Room service appropriate? Yes; Fluid consistency: Thin  Diet effective now                    Inpatient Medications  Scheduled Meds:  carvedilol   6.25 mg Oral BID WC   cephALEXin   500 mg Oral Q8H   chlordiazePOXIDE   10 mg Oral TID   cloNIDine   0.2 mg Oral TID   enoxaparin  (LOVENOX ) injection  40 mg Subcutaneous Q24H   feeding supplement  237 mL Oral BID BM   folic acid   1 mg Oral Daily   insulin  aspart  0-5 Units Subcutaneous QHS   insulin  aspart  0-6 Units Subcutaneous TID WC   lactulose   30 g Oral TID   multivitamin with minerals  1 tablet Oral Daily   nicotine   21 mg Transdermal Daily   pantoprazole   40 mg Oral Daily   phenobarbital   32.4 mg Oral Q8H   sodium chloride  flush  3 mL Intravenous Q12H   thiamine   100 mg Oral Daily   Or   thiamine   100 mg Intravenous Daily   Continuous Infusions: PRN Meds:.acetaminophen  **OR** acetaminophen , glucagon  (human recombinant), hydrALAZINE , ipratropium-albuterol , methocarbamol , metoprolol  tartrate, oxyCODONE ,  prochlorperazine   Antibiotics  :    Anti-infectives (From admission, onward)    Start     Dose/Rate Route Frequency Ordered Stop   11/20/23 0945  cephALEXin  (KEFLEX ) capsule 500 mg        500 mg Oral Every 8 hours 11/20/23 0940 11/25/23 0559         Objective:   Vitals:   11/23/23 2058 11/24/23 0811 11/24/23 0831 11/24/23 0900  BP: 110/75 103/71  103/71  Pulse:  80 79 73  Resp:  16 16 16   Temp:  97.6 F (36.4 C)  97.6 F (36.4 C)  TempSrc:  Oral    SpO2:  93% 90% 90%  Weight:      Height:        Wt Readings from Last 3 Encounters:  11/19/23 118.4 kg  09/19/23 124.3 kg  08/27/22 117.9 kg    No intake or output data in the 24 hours ending 11/24/23 0945    Physical Exam  Awake Alert, No new F.N deficits, mild DTs, note he has chronic tremors [place].AT,PERRAL Supple Neck, No JVD,   Symmetrical Chest wall movement, Good air movement bilaterally, CTAB RRR,No Gallops,Rubs or new Murmurs,  +ve B.Sounds, Abd Soft, No tenderness,   No Cyanosis, Clubbing or edema       Data Review:    Recent Labs  Lab 11/19/23 1135 11/20/23 9372 11/22/23 0534 11/23/23 9379  WBC 8.6 6.8 7.6 7.9  HGB 15.2 14.2 13.0 13.0  HCT 44.5 42.4 39.3 39.3  PLT 353 276 238 229  MCV 87.3 89.1 90.8 91.6  MCH 29.8 29.8 30.0 30.3  MCHC 34.2 33.5 33.1 33.1  RDW 16.3* 16.8* 16.4* 16.5*    Recent Labs  Lab 11/19/23 1135 11/19/23 1136 11/20/23 0627 11/21/23 0622 11/22/23 0534 11/23/23 0620  NA 138  --  137  --  136 137  K 3.7  --  3.8  --  3.6 3.7  CL 100  --  101  --  101 103  CO2 23  --  30  --  24 23  ANIONGAP 15  --  6  --  11 11  GLUCOSE 98  --  134*  --  128* 145*  BUN <5*  --  5*  --  11 11  CREATININE 0.73  --  0.83  --  0.87 0.69  AST 50*  --  42*  --  52* 56*  ALT 56*  --  49*  --  55* 63*  ALKPHOS 69  --  66  --  62 56  BILITOT 0.5  --  0.6  --  0.6 0.2  ALBUMIN 4.0  --  3.5  --  3.2* 3.3*  MG  --  2.1  --  1.9 1.8 1.7  PHOS  --   --   --  3.9  --   --   CALCIUM 9.2   --  9.0  --  8.8* 8.8*      Recent Labs  Lab 11/19/23 1135 11/19/23 1136 11/20/23 0627 11/21/23 0622 11/22/23 0534 11/23/23 0620  MG  --  2.1  --  1.9 1.8 1.7  CALCIUM 9.2  --  9.0  --  8.8* 8.8*    --------------------------------------------------------------------------------------------------------------- Lab Results  Component Value Date   CHOL 184 12/12/2022   HDL 37 (L) 12/12/2022   LDLCALC 97 12/12/2022   TRIG 250 (H) 12/12/2022   CHOLHDL 5.0 12/12/2022    Lab Results  Component Value Date   HGBA1C 6.8 (H) 11/06/2023    Radiology Report EEG adult Result Date: 11/23/2023 [place] Arlin KIDD, MD     11/23/2023  9:36 AM Patient Name: RODRICKUS MIN MRN: [id number] Epilepsy Attending: Arlin KIDD [place] Referring Physician/Provider: Dennise Lavada MARLA, MD Date: 11/23/2023 Duration: 23.27 mins Patient history: 53yo M with an episode if LOC. EEG to evaluate for seizure Level of alertness: Awake, asleep AEDs during EEG study: Phenobarb Technical aspects: This EEG study was done with scalp electrodes positioned according to the 10-20 International system of electrode placement. Electrical activity was reviewed with band pass filter of 1-70Hz , sensitivity of 7 uV/mm, display speed of 39mm/sec with a 60Hz  notched filter applied as appropriate. EEG data were recorded continuously and digitally stored.  Video monitoring was available and reviewed as appropriate. Description: The posterior dominant rhythm consists of 9-10 Hz activity of moderate voltage (25-35 uV) seen predominantly in posterior head regions, symmetric and reactive to eye opening and eye closing. Sleep was characterized by vertex waves, sleep spindles (12 to 14 Hz), maximal frontocentral region. Hyperventilation and photic stimulation were not performed.   IMPRESSION: This study is within normal limits. No seizures or epileptiform discharges were seen throughout the recording. A normal interictal EEG does not exclude the  diagnosis of epilepsy. Priyanka O Yadav      Signature  -   Lavada Dennise M.D on 11/24/2023 at 9:45 AM   -  To page go to [email or web address]

## 2023-11-25 DIAGNOSIS — F10939 Alcohol use, unspecified with withdrawal, unspecified: Secondary | ICD-10-CM | POA: Diagnosis not present

## 2023-11-25 LAB — GLUCOSE, CAPILLARY: Glucose-Capillary: 165 mg/dL — ABNORMAL HIGH (ref 70–99)

## 2023-11-25 NOTE — Plan of Care (Signed)
                                      [facility]                            [address]. [place], [place] [address]      Kristopher [name] was admitted to the Hospital on 11/19/2023 and Discharged  11/25/2023    Call Lavada Stank MD, [facility]  [phone] with questions.  Lavada Stank M.D on 11/25/2023,at 8:01 AM  [facility]   Office  [phone]

## 2023-11-25 NOTE — Discharge Summary (Signed)
 Kristopher [name] FMW:[id number] DOB: 16-Sep-1970 DOA: 11/19/2023  PCP: Patient, No Pcp Per  Admit date: 11/19/2023  Discharge date: 11/25/2023  Admitted From: Home   Disposition:  Home   Recommendations for Outpatient Follow-up:   Follow up with PCP in 1-2 weeks  PCP Please obtain BMP/CBC, 2 view CXR in 1week,  (see Discharge instructions)   PCP Please follow up on the following pending results:    Home Health: None   Equipment/Devices: None  Consultations: None  Discharge Condition: Stable    CODE STATUS: Full    Diet Recommendation: Heart Healthy     Chief Complaint  Patient presents with   Seizures     Brief history of present illness from the day of admission and additional interim summary    54 y.o. male with medical history significant for pancreatitis, chronic pain, type 2 diabetes mellitus, and alcohol abuse who presents with increased abdominal pain, tremors, anxiety, and possible seizure.  Of note patient was admitted few weeks ago for alcohol withdrawal, he now presented to the hospital with abdominal pain and finding himself on the floor of the house, does not know what happened.  In the ER he was diagnosed with DTs, CT head and C-spine nonacute he was admitted for further care.                                                                  Hospital Course   DTs.  Severe ongoing alcohol abuse he drinks about 20 bottles of beer a day if not more, he was recently admitted few weeks ago for similar issue.  Again he has been strictly counseled to abstain from alcohol, he was treated for DTs per protocol, now completely DT free will be discharged home, has all his medications from previous discharge 1 week ago will continue taking them unchanged.   ?? Passing out at home.  Likely due to alcohol  withdrawal, CT head, C-spine and EEG stable.  Requested to abstain from alcohol, no seizure-like activity here again negative EEG.  Back to baseline post discharge will follow-up with PCP at [facility] neurology within 1 to 2 weeks.   Chronic abdominal pain.  Much improved.  No acute findings on CT abdomen pelvis, lipase stable, supportive care he chronically uses narcotics.   Hypertension.  Stable on Coreg .   History of right arm SVT.  Avoid IV line placement in the right arm.    Obesity with BMI of 38.  Follow with PCP for weight loss.    Alcoholic cirrhosis on CT.  Follow-up with PCP outpatient.  Quit alcohol.     DM type II.  Continue home regimen  Discharge diagnosis     Principal Problem:   Alcohol withdrawal (HCC) Active Problems:   Acute on chronic pancreatitis (HCC)  Non-insulin  dependent type 2 diabetes mellitus (HCC)    Discharge instructions    Discharge Instructions     Diet - low sodium heart healthy   Complete by: As directed    Discharge instructions   Complete by: As directed    Do not drive, operate heavy machinery, perform activities at heights, swimming or participation in water activities or provide baby sitting services until you have seen by Primary MD or a Neurologist and advised to do so again.  Follow with Primary MD  in 7 days   Get CBC, CMP, Magnesium , 2 view Chest X ray -  checked next visit with your primary MD   Activity: As tolerated with Full fall precautions use walker/cane & assistance as needed  Disposition Home    Diet: Heart Healthy   Special Instructions: If you have smoked or chewed Tobacco  in the last 2 yrs please stop smoking, stop any regular Alcohol  and or any Recreational drug use.  On your next visit with your primary care physician please Get Medicines reviewed and adjusted.  Please request your Prim.MD to go over all Hospital Tests and Procedure/Radiological results at the follow up, please get all Hospital records sent  to your Prim MD by signing hospital release before you go home.  If you experience worsening of your admission symptoms, develop shortness of breath, life threatening emergency, suicidal or homicidal thoughts you must seek medical attention immediately by calling 911 or calling your MD immediately  if symptoms less severe.  You Must read complete instructions/literature along with all the possible adverse reactions/side effects for all the Medicines you take and that have been prescribed to you. Take any new Medicines after you have completely understood and accpet all the possible adverse reactions/side effects.   Do not drive when taking Pain medications.  Do not take more than prescribed Pain, Sleep and Anxiety Medications  Wear Seat belts while driving.   Do not drive, operate heavy machinery, perform activities at heights, swimming or participation in water activities or provide baby sitting services until you have seen by Primary MD or a Neurologist and advised to do so again.   Increase activity slowly   Complete by: As directed        Discharge Medications   Allergies as of 11/25/2023       Reactions   Ivp Dye [iodinated Contrast Media] Anaphylaxis        Medication List     TAKE these medications    albuterol  108 (90 Base) MCG/ACT inhaler Commonly known as: VENTOLIN  HFA Inhale 1-2 puffs into the lungs every 6 (six) hours as needed for wheezing or shortness of breath.   carvedilol  6.25 MG tablet Commonly known as: COREG  Take 1 tablet (6.25 mg total) by mouth 2 (two) times daily with a meal. What changed: when to take this   Constulose  10 GM/15ML solution Generic drug: lactulose  Take 45 mLs (30 g total) by mouth 3 (three) times daily.   folic acid  1 MG tablet Commonly known as: FOLVITE  Take 1 tablet (1 mg total) by mouth daily.   gabapentin  300 MG capsule Commonly known as: NEURONTIN  Take 1 capsule (300 mg total) by mouth 3 (three) times daily.   hydrOXYzine   25 MG tablet Commonly known as: ATARAX  Take 1 tablet (25 mg total) by mouth 3 (three) times daily as needed for anxiety.   lisinopril  10 MG tablet Commonly known as: ZESTRIL  Take 10 mg by mouth daily.   metFORMIN  1000  MG tablet Commonly known as: GLUCOPHAGE  Take 1,000 mg by mouth.  Take 1,000 mg by mouth in the morning and 1,000 mg before bedtime.   methocarbamol  500 MG tablet Commonly known as: ROBAXIN  Take 500 mg by mouth 3 (three) times daily.   nicotine  14 mg/24hr patch Commonly known as: NICODERM CQ  - dosed in mg/24 hours Place 1 patch (14 mg total) onto the skin daily.   omeprazole  40 MG capsule Commonly known as: PRILOSEC Take 1 capsule (40 mg total) by mouth daily.   oxyCODONE  5 MG immediate release tablet Commonly known as: Oxy IR/ROXICODONE  Take 1 tablet (5 mg total) by mouth every 6 (six) hours as needed for moderate pain (pain score 4-6) or severe pain (pain score 7-10).   thiamine  100 MG tablet Commonly known as: VITAMIN B1 Take 1 tablet (100 mg total) by mouth daily.         Follow-up Information     [facility]. Schedule an appointment as soon as possible for a visit in 1 week(s).   Why: Or your primary care physician within a week Contact information: [address] [employer] [address] [place]  [address] [phone]        [facility]. Schedule an appointment as soon as possible for a visit in 1 week(s).   Why: syncope Contact information: [address]     [address] [place]  [address] [phone]                Major procedures and Radiology Reports - PLEASE review detailed and final reports thoroughly  -       EEG adult Result Date: 11/23/2023 Shelton Arlin KIDD, MD     11/23/2023  9:36 AM Patient Name: Kristopher [name] MRN: [id number] Epilepsy Attending: Arlin KIDD Shelton Referring Physician/Provider: Dennise Lavada MARLA, MD Date: 11/23/2023 Duration:  23.27 mins Patient history: 53yo M with an episode if LOC. EEG to evaluate for seizure Level of alertness: Awake, asleep AEDs during EEG study: Phenobarb Technical aspects: This EEG study was done with scalp electrodes positioned according to the 10-20 International system of electrode placement. Electrical activity was reviewed with band pass filter of 1-70Hz , sensitivity of 7 uV/mm, display speed of 67mm/sec with a 60Hz  notched filter applied as appropriate. EEG data were recorded continuously and digitally stored.  Video monitoring was available and reviewed as appropriate. Description: The posterior dominant rhythm consists of 9-10 Hz activity of moderate voltage (25-35 uV) seen predominantly in posterior head regions, symmetric and reactive to eye opening and eye closing. Sleep was characterized by vertex waves, sleep spindles (12 to 14 Hz), maximal frontocentral region. Hyperventilation and photic stimulation were not performed.   IMPRESSION: This study is within normal limits. No seizures or epileptiform discharges were seen throughout the recording. A normal interictal EEG does not exclude the diagnosis of epilepsy. Arlin KIDD Shelton   CT Cervical Spine Wo Contrast Result Date: 11/19/2023 CLINICAL DATA:  Seizure EXAM: CT CERVICAL SPINE WITHOUT CONTRAST TECHNIQUE: Multidetector CT imaging of the cervical spine was performed without intravenous contrast. Multiplanar CT image reconstructions were also generated. RADIATION DOSE REDUCTION: This exam was performed according to the departmental dose-optimization program which includes automated exposure control, adjustment of the mA and/or kV according to patient size and/or use of iterative reconstruction technique. COMPARISON:  None Available. FINDINGS: No significant alignment abnormality. The craniocervical junction is normal. The atlantoaxial articulation is normal. The dens is normal. There is no fracture identified. Other  comments: There is degenerative  disc disease at C4-5, C5-6 and C6-7. No significant facet disease. IMPRESSION: Degenerative disc disease at C4-C7. No fracture Electronically Signed   By: Nancyann Burns M.D.   On: 11/19/2023 15:27   CT Head Wo Contrast Result Date: 11/19/2023 CLINICAL DATA:  Seizure EXAM: CT HEAD WITHOUT CONTRAST TECHNIQUE: Contiguous axial images were obtained from the base of the skull through the vertex without intravenous contrast. RADIATION DOSE REDUCTION: This exam was performed according to the departmental dose-optimization program which includes automated exposure control, adjustment of the mA and/or kV according to patient size and/or use of iterative reconstruction technique. COMPARISON:  11/06/2023 FINDINGS: CT HEAD: Attenuation in the brain parenchyma is normal. There is no hemorrhage. No acute ischemic changes. No mass lesion. The ventricles are normal. Skull/sinuses/orbits: No significant abnormality. IMPRESSION: Normal Electronically Signed   By: Nancyann Burns M.D.   On: 11/19/2023 15:25   CT ABDOMEN PELVIS WO CONTRAST Result Date: 11/19/2023 CLINICAL DATA:  Abdominal pain.  History of pancreatitis. EXAM: CT ABDOMEN AND PELVIS WITHOUT CONTRAST TECHNIQUE: Multidetector CT imaging of the abdomen and pelvis was performed following the standard protocol without IV contrast. RADIATION DOSE REDUCTION: This exam was performed according to the departmental dose-optimization program which includes automated exposure control, adjustment of the mA and/or kV according to patient size and/or use of iterative reconstruction technique. COMPARISON:  November 06, 2023. FINDINGS: Lower chest: Minimal bilateral posterior basilar subsegmental atelectasis is noted. Hepatobiliary: No cholelithiasis or biliary dilatation is noted. Nodular hepatic contours are noted suggesting hepatic cirrhosis. Pancreas: Unremarkable. No pancreatic ductal dilatation or surrounding inflammatory changes. Spleen: Normal in size without focal abnormality.  Adrenals/Urinary Tract: Adrenal glands are unremarkable. Kidneys are normal, without renal calculi, focal lesion, or hydronephrosis. Bladder is unremarkable. Stomach/Bowel: Stomach is within normal limits. Appendix appears normal. No evidence of bowel wall thickening, distention, or inflammatory changes. Vascular/Lymphatic: Aortic atherosclerosis. No enlarged abdominal or pelvic lymph nodes. Reproductive: Prostate is unremarkable. Other: No abdominal wall hernia or abnormality. No abdominopelvic ascites. Musculoskeletal: No acute or significant osseous findings. IMPRESSION: 1. Nodular hepatic contours are noted suggesting hepatic cirrhosis. 2. No other abnormality seen in the abdomen or pelvis. 3. Aortic atherosclerosis. Aortic Atherosclerosis (ICD10-I70.0). Electronically Signed   By: Lynwood Landy Raddle M.D.   On: 11/19/2023 15:20   VAS US  UPPER EXTREMITY VENOUS DUPLEX Result Date: 11/07/2023 UPPER VENOUS STUDY  Patient Name:  Kristopher [name]  Date of Exam:   11/07/2023 Medical Rec #: [id number]          Accession #:    [id number] Date of Birth: 16-Feb-1971         Patient Gender: M Patient Age:   15 years Exam Location:  [facility] Procedure:      VAS US  UPPER EXTREMITY VENOUS DUPLEX Referring Phys: LAVADA [facility] --------------------------------------------------------------------------------  Indications: Edema, and Numbness of hand, fingers, and forearm Risk Factors: ETOH abuse, withdrawal, pancreatitis. Comparison Study: No prior study on file Performing Technologist: Alberta Lis RVS  Examination Guidelines: A complete evaluation includes B-mode imaging, spectral Doppler, color Doppler, and power Doppler as needed of all accessible portions of each vessel. Bilateral testing is considered an integral part of a complete examination. Limited examinations for reoccurring indications may be performed as noted.  Right Findings: +----------+------------+---------+-----------+----------+-------+ RIGHT      CompressiblePhasicitySpontaneousPropertiesSummary +----------+------------+---------+-----------+----------+-------+ IJV                      Yes  Yes                      +----------+------------+---------+-----------+----------+-------+ Subclavian               Yes       Yes                      +----------+------------+---------+-----------+----------+-------+ Axillary      Full       Yes       Yes                      +----------+------------+---------+-----------+----------+-------+ Brachial      Full       Yes       Yes                      +----------+------------+---------+-----------+----------+-------+ Radial        Full                                          +----------+------------+---------+-----------+----------+-------+ Ulnar         Full                                          +----------+------------+---------+-----------+----------+-------+ Cephalic    Partial      No        No                Acute  +----------+------------+---------+-----------+----------+-------+ Basilic     Partial      No        No                Acute  +----------+------------+---------+-----------+----------+-------+  Left Findings: +----------+------------+---------+-----------+----------+-------+ LEFT      CompressiblePhasicitySpontaneousPropertiesSummary +----------+------------+---------+-----------+----------+-------+ Subclavian               Yes       Yes                      +----------+------------+---------+-----------+----------+-------+ Arterial and venous flow noted to posterior hand and snuff box  Summary:  Right: No evidence of deep vein thrombosis in the upper extremity. Findings consistent with acute superficial vein thrombosis involving the right basilic vein and right cephalic vein.  Left: No evidence of thrombosis in the subclavian.  *See table(s) above for measurements and observations.  Diagnosing physician: Gaile New  MD Electronically signed by Gaile New MD on 11/07/2023 at 7:13:23 PM.    Final    CT Cervical Spine Wo Contrast Result Date: 11/06/2023 CLINICAL DATA:  53 year old male with recent seizure activity. Right upper extremity weakness, cervical radiculopathy. EXAM: CT CERVICAL SPINE WITHOUT CONTRAST TECHNIQUE: Multidetector CT imaging of the cervical spine was performed without intravenous contrast. Multiplanar CT image reconstructions were also generated. RADIATION DOSE REDUCTION: This exam was performed according to the departmental dose-optimization program which includes automated exposure control, adjustment of the mA and/or kV according to patient size and/or use of iterative reconstruction technique. COMPARISON:  Head CT earlier today. Cervical spine CT 12/14/2022. And cervical spine MRI 12/14/2022. FINDINGS: Alignment: Chronic straightening, mildly increased reversal of cervical lordosis since last year. Cervicothoracic junction alignment is within normal limits. Bilateral posterior element alignment is within normal limits. Skull base  and vertebrae: Bone mineralization is within normal limits. Visualized skull base is intact. No atlanto-occipital dissociation. C1 and C2 appear intact and aligned. No acute osseous abnormality identified. Soft tissues and spinal canal: No prevertebral fluid or swelling. No visible canal hematoma. Negative visible noncontrast neck soft tissues. Disc levels:  C2-C3 and C3-C4 appear negative. C4-C5:  Mild disc space loss.  Mostly anterior endplate spurring. C5-C6: Disc space loss with circumferential disc bulge and endplate spurring which is asymmetric to the right. Evidence of mild to moderate spinal stenosis, and moderate to severe right C6 foraminal stenosis. This level appears stable to the MRI last year. C6-C7: Severe disc space loss. Circumferential disc osteophyte complex which is more symmetric. Mild spinal stenosis, moderate to severe bilateral C7 foraminal stenosis.  This level appears stable from the MRI last year. C7-T1:  Negative. Upper chest: Negative visible noncontrast thoracic inlet. IMPRESSION: 1. No acute osseous abnormality in the cervical spine. 2. 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 Associated spinal stenosis and moderate to severe C6 and C7 nerve level foraminal stenosis which appears stable from MRI 12/14/2022. Electronically Signed   By: VEAR Hurst M.D.   On: 11/06/2023 06:20   CT ABDOMEN PELVIS WO CONTRAST Result Date: 11/06/2023 CLINICAL DATA:  Acute nonlocalized abdominal pain EXAM: CT ABDOMEN AND PELVIS WITHOUT CONTRAST TECHNIQUE: Multidetector CT imaging of the abdomen and pelvis was performed following the standard protocol without IV contrast. RADIATION DOSE REDUCTION: This exam was performed according to the departmental dose-optimization program which includes automated exposure control, adjustment of the mA and/or kV according to patient size and/or use of iterative reconstruction technique. COMPARISON:  None Available. FINDINGS: Lower chest: No acute abnormality. Hepatobiliary: Moderate hepatic steatosis. No enhancing intrahepatic mass. No intra or extrahepatic biliary ductal dilation. Gallbladder unremarkable. Pancreas: Unremarkable Spleen: Unremarkable Adrenals/Urinary Tract: Adrenal glands are unremarkable. Kidneys are normal, without renal calculi, focal lesion, or hydronephrosis. Bladder is unremarkable. Stomach/Bowel: Mild descending colonic diverticulosis. Stomach, small bowel, and large bowel are otherwise unremarkable. Appendix normal. No evidence of obstruction or focal inflammation. No free intraperitoneal gas or fluid. Vascular/Lymphatic: Aortic atherosclerosis. No enlarged abdominal or pelvic lymph nodes. Reproductive: Prostate is unremarkable. Other: No abdominal wall hernia or abnormality. No abdominopelvic ascites. Musculoskeletal: Multiple healed right rib fractures noted. No acute or significant osseous  findings. IMPRESSION: 1. No acute intra-abdominal pathology identified. No definite radiographic explanation for the patient's reported symptoms. 2. Moderate hepatic steatosis. 3. Mild descending colonic diverticulosis. 4. Aortic atherosclerosis. Aortic Atherosclerosis (ICD10-I70.0). Electronically Signed   By: Dorethia Molt M.D.   On: 11/06/2023 03:57   CT Head Wo Contrast Result Date: 11/06/2023 CLINICAL DATA:  Recent seizure activity EXAM: CT HEAD WITHOUT CONTRAST TECHNIQUE: Contiguous axial images were obtained from the base of the skull through the vertex without intravenous contrast. RADIATION DOSE REDUCTION: This exam was performed according to the departmental dose-optimization program which includes automated exposure control, adjustment of the mA and/or kV according to patient size and/or use of iterative reconstruction technique. COMPARISON:  12/14/2022 FINDINGS: Brain: No evidence of acute infarction, hemorrhage, hydrocephalus, extra-axial collection or mass lesion/mass effect. Vascular: No hyperdense vessel or unexpected calcification. Skull: Normal. Negative for fracture or focal lesion. Sinuses/Orbits: No acute finding. Other: None. IMPRESSION: No acute intracranial abnormality noted. Electronically Signed   By: Oneil Devonshire M.D.   On: 11/06/2023 03:54    Micro Results     No results found for this or any previous visit (from the past 240 hours).  Today  Subjective    Kristopher [name] today has no headache,no chest abdominal pain,no new weakness tingling or numbness, feels much better wants to go home today.     Objective   Blood pressure 128/70, pulse 71, temperature 98 F (36.7 C), temperature source Oral, resp. rate 15, height 5' 11 (1.803 m), weight 118.4 kg, SpO2 95%.  No intake or output data in the 24 hours ending 11/25/23 0806  Exam  Awake Alert, No new F.N deficits,    [place].AT,PERRAL Supple Neck,   Symmetrical Chest wall movement, Good air movement bilaterally,  CTAB RRR,No Gallops,   +ve B.Sounds, Abd Soft, Non tender,  No Cyanosis, Clubbing or edema    Data Review   Recent Labs  Lab 11/19/23 1135 11/20/23 0627 11/22/23 0534 11/23/23 0620  WBC 8.6 6.8 7.6 7.9  HGB 15.2 14.2 13.0 13.0  HCT 44.5 42.4 39.3 39.3  PLT 353 276 238 229  MCV 87.3 89.1 90.8 91.6  MCH 29.8 29.8 30.0 30.3  MCHC 34.2 33.5 33.1 33.1  RDW 16.3* 16.8* 16.4* 16.5*    Recent Labs  Lab 11/19/23 1135 11/19/23 1136 11/20/23 0627 11/21/23 0622 11/22/23 0534 11/23/23 0620  NA 138  --  137  --  136 137  K 3.7  --  3.8  --  3.6 3.7  CL 100  --  101  --  101 103  CO2 23  --  30  --  24 23  ANIONGAP 15  --  6  --  11 11  GLUCOSE 98  --  134*  --  128* 145*  BUN <5*  --  5*  --  11 11  CREATININE 0.73  --  0.83  --  0.87 0.69  AST 50*  --  42*  --  52* 56*  ALT 56*  --  49*  --  55* 63*  ALKPHOS 69  --  66  --  62 56  BILITOT 0.5  --  0.6  --  0.6 0.2  ALBUMIN 4.0  --  3.5  --  3.2* 3.3*  MG  --  2.1  --  1.9 1.8 1.7  PHOS  --   --   --  3.9  --   --   CALCIUM 9.2  --  9.0  --  8.8* 8.8*    Total Time in preparing paper work, data evaluation and todays exam - 35 minutes  Signature  -    Lavada Stank M.D on 11/25/2023 at 8:06 AM   -  To page go to [email or web address]

## 2023-11-25 NOTE — Plan of Care (Signed)

## 2024-02-16 ENCOUNTER — Emergency Department
Admission: EM | Admit: 2024-02-16 | Discharge: 2024-02-17 | Disposition: A | Discharge status: Home / Self Care | Payer: Self-pay | Attending: Emergency Medicine | Admitting: Emergency Medicine

## 2024-02-16 DIAGNOSIS — E876 Hypokalemia: Secondary | ICD-10-CM | POA: Insufficient documentation

## 2024-02-16 DIAGNOSIS — K2920 Alcoholic gastritis without bleeding: Secondary | ICD-10-CM | POA: Insufficient documentation

## 2024-02-16 DIAGNOSIS — E871 Hypo-osmolality and hyponatremia: Secondary | ICD-10-CM | POA: Insufficient documentation

## 2024-02-17 ENCOUNTER — Emergency Department: Payer: Self-pay

## 2024-02-17 LAB — CBC WITH DIFFERENTIAL/PLATELET
Abs Immature Granulocytes: 0.03 K/uL (ref 0.00–0.07)
Basophils Absolute: 0.1 K/uL (ref 0.0–0.1)
Basophils Relative: 1 %
Eosinophils Absolute: 0 K/uL (ref 0.0–0.5)
Eosinophils Relative: 0 %
HCT: 42.4 % (ref 39.0–52.0)
Hemoglobin: 14.3 g/dL (ref 13.0–17.0)
Immature Granulocytes: 0 %
Lymphocytes Relative: 37 %
Lymphs Abs: 2.6 K/uL (ref 0.7–4.0)
MCH: 30.8 pg (ref 26.0–34.0)
MCHC: 33.7 g/dL (ref 30.0–36.0)
MCV: 91.2 fL (ref 80.0–100.0)
Monocytes Absolute: 0.4 K/uL (ref 0.1–1.0)
Monocytes Relative: 6 %
Neutro Abs: 3.8 K/uL (ref 1.7–7.7)
Neutrophils Relative %: 56 %
Platelets: 198 K/uL (ref 150–400)
RBC: 4.65 MIL/uL (ref 4.22–5.81)
RDW: 18.4 % — ABNORMAL HIGH (ref 11.5–15.5)
WBC: 6.9 K/uL (ref 4.0–10.5)
nRBC: 0 % (ref 0.0–0.2)

## 2024-02-17 LAB — COMPREHENSIVE METABOLIC PANEL WITH GFR
ALT: 74 U/L — ABNORMAL HIGH (ref 0–44)
AST: 102 U/L — ABNORMAL HIGH (ref 15–41)
Albumin: 3.9 g/dL (ref 3.5–5.0)
Alkaline Phosphatase: 118 U/L (ref 38–126)
Anion gap: 12 (ref 5–15)
BUN: <5 mg/dL — ABNORMAL LOW (ref 6–20)
CO2: 23 mmol/L (ref 22–32)
Calcium: 8.4 mg/dL — ABNORMAL LOW (ref 8.9–10.3)
Chloride: 99 mmol/L (ref 98–111)
Creatinine, Ser: 0.75 mg/dL (ref 0.61–1.24)
GFR, Estimated: >60 mL/min (ref >60)
Glucose, Bld: 232 mg/dL — ABNORMAL HIGH (ref 70–99)
Potassium: 3.2 mmol/L — ABNORMAL LOW (ref 3.5–5.1)
Sodium: 134 mmol/L — ABNORMAL LOW (ref 135–145)
Total Bilirubin: 0.5 mg/dL (ref 0.0–1.2)
Total Protein: 6.8 g/dL (ref 6.5–8.1)

## 2024-02-17 LAB — LIPASE, BLOOD: Lipase: 33 U/L (ref 11–51)

## 2024-02-17 LAB — TROPONIN T, HIGH SENSITIVITY
Troponin T High Sensitivity: <15 ng/L (ref 0–19)
Troponin T High Sensitivity: <15 ng/L (ref 0–19)

## 2024-02-17 MED ORDER — SODIUM CHLORIDE 0.9 % IV BOLUS
1000.0000 mL | Freq: Once | INTRAVENOUS | Status: AC
  Administered 2024-02-17: 1000 mL via INTRAVENOUS

## 2024-02-17 MED ORDER — SUCRALFATE 1 G PO TABS: 1.0000 g | ORAL_TABLET | Freq: Three times a day (TID) | ORAL | 0 refills | Status: AC

## 2024-02-17 MED ORDER — PANTOPRAZOLE SODIUM 40 MG IV SOLR
40.0000 mg | Freq: Once | INTRAVENOUS | Status: AC
  Administered 2024-02-17: 40 mg via INTRAVENOUS
  Filled 2024-02-17: qty 10

## 2024-02-17 MED ORDER — ONDANSETRON 4 MG PO TBDP: 4.0000 mg | ORAL_TABLET | Freq: Three times a day (TID) | ORAL | 0 refills | Status: AC | PRN

## 2024-02-17 MED ORDER — DROPERIDOL 2.5 MG/ML IJ SOLN
1.2500 mg | Freq: Once | INTRAMUSCULAR | Status: AC
  Administered 2024-02-17: 1.25 mg via INTRAVENOUS
  Filled 2024-02-17: qty 2

## 2024-02-17 MED ORDER — HYDROMORPHONE HCL 1 MG/ML IJ SOLN
1.0000 mg | Freq: Once | INTRAMUSCULAR | Status: AC
  Administered 2024-02-17: 1 mg via INTRAVENOUS
  Filled 2024-02-17: qty 1

## 2024-02-17 MED ORDER — PANTOPRAZOLE SODIUM 40 MG PO TBEC: 40.0000 mg | DELAYED_RELEASE_TABLET | Freq: Every day | ORAL | 0 refills | Status: AC

## 2024-02-17 NOTE — ED Triage Notes (Addendum)
 Pt to ED BIB [facility] from the [place] with c/o LUQ abdominal pain, nausea and vomiting. Pt with hx of pancreatitis, states this feels the same. Pain began 3 days ago but much worse tonight. Pt with hx of ETOH abuse, reports he relapsed recently. Hx of alcohol withdrawal. Received 100mcg of fentanyl  and 4mg  zofran  with EMS, reports no relief

## 2024-02-17 NOTE — ED Provider Notes (Signed)
 [facility] Provider Note    Event Date/Time   First MD Initiated Contact with Patient 02/17/24 0005     (approximate)   History   Abdominal Pain   HPI  Kristopher [name] is a 53 y.o. male with history of alcohol abuse who comes in with concerns for epigastric pain.  Patient reports epigastric burning sensation in the upper abdomen and the left side.  He does report of the past 2 or 3 days drinking alcohol again.  He denies any chest pain, shortness of breath, falls or hitting his head.  He reports that this feels like his pancreas is flared up.  Denies any urinary symptoms or any other concerns.  Reports normal bowel movements.   I reviewed the prior CT imaging where patient has some nodularity of his liver concerning for cirrhosis but otherwise has had a negative CT imaging on 11/19/2023.  Patient reports that this feels similar to what he has had to come in previously.   Physical Exam   Triage Vital Signs: ED Triage Vitals  Encounter Vitals Group     BP --      Girls Systolic BP Percentile --      Girls Diastolic BP Percentile --      Boys Systolic BP Percentile --      Boys Diastolic BP Percentile --      Pulse --      Resp --      Temp --      Temp src --      SpO2 --      Weight 02/17/24 0004 (!) 300 lb 3.2 oz (136.2 kg)     Height 02/17/24 0004 5' 11 (1.803 m)     Head Circumference --      Peak Flow --      Pain Score 02/17/24 0001 10     Pain Loc --      Pain Education --      Exclude from Growth Chart --     Most recent vital signs: Vitals:   02/17/24 0007  BP: (!) 146/91  Pulse: (!) 102  Resp: 16  Temp: 98.1 F (36.7 C)  SpO2: 99%     General: Awake, no distress.  CV:  Good peripheral perfusion.  Tachycardia Resp:  Normal effort.  Abd:  No distention.  Some reported tenderness in the upper abdomen without any rebound, guarding Other:  No evidence of trauma noted   ED Results / Procedures / Treatments   Labs (all  labs ordered are listed, but only abnormal results are displayed) Labs Reviewed  CBC WITH DIFFERENTIAL/PLATELET - Abnormal; Notable for the following components:      Result Value   RDW 18.4 (*)    All other components within normal limits  COMPREHENSIVE METABOLIC PANEL WITH GFR - Abnormal; Notable for the following components:   Sodium 134 (*)    Potassium 3.2 (*)    Glucose, Bld 232 (*)    BUN <5 (*)    Calcium 8.4 (*)    AST 102 (*)    ALT 74 (*)    All other components within normal limits  LIPASE, BLOOD  URINALYSIS, ROUTINE W REFLEX MICROSCOPIC  TROPONIN T, HIGH SENSITIVITY     EKG  My interpretation of EKG:  Sinus rate of 99 without any ST elevation or T wave inversion, QTc of 448  RADIOLOGY I have reviewed the xray personally and interpreted no evidence of any pneumonia  PROCEDURES:  Critical Care performed: No  .1-3 Lead EKG Interpretation  Performed by: Ernest Ronal BRAVO, MD Authorized by: Ernest Ronal BRAVO, MD     Interpretation: normal     ECG rate:  90   ECG rate assessment: normal     Rhythm: sinus rhythm     Ectopy: none     Conduction: normal      MEDICATIONS ORDERED IN ED: Medications  pantoprazole  (PROTONIX ) injection 40 mg (40 mg Intravenous Given 02/17/24 0058)  sodium chloride  0.9 % bolus 1,000 mL (1,000 mLs Intravenous New Bag/Given 02/17/24 0057)  HYDROmorphone  (DILAUDID ) injection 1 mg (1 mg Intravenous Given 02/17/24 0058)  droperidol  (INAPSINE ) 2.5 MG/ML injection 1.25 mg (1.25 mg Intravenous Given 02/17/24 0211)     IMPRESSION / MDM / ASSESSMENT AND PLAN / ED COURSE  I reviewed the triage vital signs and the nursing notes.   Patient's presentation is most consistent with acute presentation with potential threat to life or bodily function.   Patient comes in with upper abdominal pain in the setting of known alcoholic use.  Patient is been here previously multiple times in the setting of gastritis, pancreatitis.  Patient was given 1 dose  of Dilaudid , Protonix , fluids to help with symptoms.  Will check labs to evaluate for Electra abnormalities, AKI.  Considered CT imaging but he reports this feels similar to his prior so I do not feel like CT imaging is warranted at this time is unlikely to have acute pathology.  Patient's had multiple CTs previously that have overall been reassuring.  He has known hepatic steatosis versus cirrhosis.  We will check labs to evaluate for choledocholithiasis, ACS, UTI, electro abnormalities  Patient's pain treated with Dilaudid , Protonix , fluids  Patient was still having pain so treated with droperidol .  3:08 AM patient feeling much improved requesting discharge home.  Repeat abdominal exam is soft and nontender.  States that he has someone who can come pick him up we discussed decreasing alcohol use and treatments with Protonix , Carafate , Zofran .  Patient expressed understanding and felt comfortable with this plan.  Do not feel a repeat troponin is necessary given symptoms been ongoing for 3 days and this seems less likely related to ACS and more likely related to alcoholic gastritis.  Patient does not want to have urine done and declines urine test.  Patient is tolerating p.o.   The patient is on the cardiac monitor to evaluate for evidence of arrhythmia and/or significant heart rate changes.      FINAL CLINICAL IMPRESSION(S) / ED DIAGNOSES   Final diagnoses:  Acute alcoholic gastritis without hemorrhage     Rx / DC Orders   ED Discharge Orders          Ordered    pantoprazole  (PROTONIX ) 40 MG tablet  Daily        02/17/24 0309    sucralfate  (CARAFATE ) 1 g tablet  3 times daily with meals & bedtime        02/17/24 0309    ondansetron  (ZOFRAN -ODT) 4 MG disintegrating tablet  Every 8 hours PRN        02/17/24 0309             Note:  This document was prepared using Dragon voice recognition software and may include unintentional dictation errors.   Ernest Ronal BRAVO,  MD 02/17/24 [id number]

## 2024-02-17 NOTE — Discharge Instructions (Addendum)
 Cut on your alcohol use as I suspect that this could be contributing to your symptoms.  We are treating you for possible gastritis with acid reducers, Carafate  and Zofran .  Return to the ER if you develop worsening pain or any other concerns.

## 2024-03-08 ENCOUNTER — Emergency Department
Admission: EM | Admit: 2024-03-08 | Discharge: 2024-03-08 | Discharge status: Against Medical Advice | Attending: Emergency Medicine | Admitting: Emergency Medicine

## 2024-03-08 ENCOUNTER — Other Ambulatory Visit: Payer: Self-pay

## 2024-03-08 DIAGNOSIS — R42 Dizziness and giddiness: Secondary | ICD-10-CM | POA: Insufficient documentation

## 2024-03-08 DIAGNOSIS — Z5321 Procedure and treatment not carried out due to patient leaving prior to being seen by health care provider: Secondary | ICD-10-CM | POA: Insufficient documentation

## 2024-03-08 DIAGNOSIS — R0602 Shortness of breath: Secondary | ICD-10-CM | POA: Insufficient documentation

## 2024-03-08 DIAGNOSIS — R1011 Right upper quadrant pain: Secondary | ICD-10-CM | POA: Insufficient documentation

## 2024-03-08 LAB — COMPREHENSIVE METABOLIC PANEL WITH GFR
ALT: 59 U/L — ABNORMAL HIGH (ref 0–44)
AST: 56 U/L — ABNORMAL HIGH (ref 15–41)
Albumin: 4.2 g/dL (ref 3.5–5.0)
Alkaline Phosphatase: 93 U/L (ref 38–126)
Anion gap: 11 (ref 5–15)
BUN: 9 mg/dL (ref 6–20)
CO2: 26 mmol/L (ref 22–32)
Calcium: 9.4 mg/dL (ref 8.9–10.3)
Chloride: 101 mmol/L (ref 98–111)
Creatinine, Ser: 0.77 mg/dL (ref 0.61–1.24)
GFR, Estimated: >60 mL/min (ref >60)
Glucose, Bld: 220 mg/dL — ABNORMAL HIGH (ref 70–99)
Potassium: 3.7 mmol/L (ref 3.5–5.1)
Sodium: 138 mmol/L (ref 135–145)
Total Bilirubin: 0.5 mg/dL (ref 0.0–1.2)
Total Protein: 7.4 g/dL (ref 6.5–8.1)

## 2024-03-08 LAB — CBC
HCT: 44.2 % (ref 39.0–52.0)
Hemoglobin: 14.6 g/dL (ref 13.0–17.0)
MCH: 30.9 pg (ref 26.0–34.0)
MCHC: 33 g/dL (ref 30.0–36.0)
MCV: 93.6 fL (ref 80.0–100.0)
Platelets: 270 K/uL (ref 150–400)
RBC: 4.72 MIL/uL (ref 4.22–5.81)
RDW: 18.1 % — ABNORMAL HIGH (ref 11.5–15.5)
WBC: 9.5 K/uL (ref 4.0–10.5)
nRBC: 0 % (ref 0.0–0.2)

## 2024-03-08 LAB — LIPASE, BLOOD: Lipase: 21 U/L (ref 11–51)

## 2024-03-08 NOTE — ED Triage Notes (Addendum)
 RUQ abd pain today. Hx Pancreatitis.  Arrived aby [facility] who were called to a gas station. Patient out walking and init c/o dizzy and SOB. Now only c/o abd pain.  AAOx3. Skin warm and dry. NAD

## 2024-03-08 NOTE — ED Triage Notes (Signed)
 Pt comes [facility] [place] [facility] with sob after walking. Pt states sharp pain in abdomen and hx of pancreatitis.

## 2024-03-13 ENCOUNTER — Ambulatory Visit: Admission: EM | Admit: 2024-03-13 | Discharge: 2024-03-13 | Disposition: A | Discharge status: Home / Self Care

## 2024-03-13 DIAGNOSIS — R051 Acute cough: Secondary | ICD-10-CM

## 2024-03-13 DIAGNOSIS — R0981 Nasal congestion: Secondary | ICD-10-CM | POA: Diagnosis not present

## 2024-03-13 DIAGNOSIS — J069 Acute upper respiratory infection, unspecified: Secondary | ICD-10-CM | POA: Diagnosis not present

## 2024-03-13 LAB — POC SOFIA SARS ANTIGEN FIA: SARS Coronavirus 2 Ag: NEGATIVE

## 2024-03-13 MED ORDER — PSEUDOEPH-BROMPHEN-DM 30-2-10 MG/5ML PO SYRP: 10.0000 mL | ORAL_SOLUTION | Freq: Four times a day (QID) | ORAL | 0 refills | Status: AC | PRN

## 2024-03-13 NOTE — ED Triage Notes (Signed)
 Pt c/o cough, congestion, body aches x3days  Pt asks for a doctors note.

## 2024-03-13 NOTE — Discharge Instructions (Signed)

## 2024-03-13 NOTE — ED Provider Notes (Signed)
 " [facility]    CSN: [id number] Arrival date & time: 03/13/24  1453      History   Chief Complaint Chief Complaint  Patient presents with   Cough   Generalized Body Aches    HPI ERMA JOUBERT is a 53 y.o. male presenting for 2-day history of fatigue, congestion, cough, body aches, chills.  Denies fever, sore throat, chest pain, wheezing, shortness of breath, abdominal pain, vomiting and diarrhea.  Patient is a naval architect.  Reports he does not feel comfortable working since he has been sick and would like a note for work.  He has taken over-the-counter DayQuil and NyQuil.  HPI  Past Medical History:  Diagnosis Date   Alcohol dependence (HCC)    COPD (chronic obstructive pulmonary disease) (HCC)    Diabetes mellitus without complication (HCC)    GERD (gastroesophageal reflux disease)    Hypertension    Pancreatitis    Pancreatitis     Patient Active Problem List   Diagnosis Date Noted   Alcohol withdrawal (HCC) 11/19/2023   Non-insulin  dependent type 2 diabetes mellitus (HCC) 11/19/2023   Alcohol-induced mood disorder with depressive symptoms (HCC) 12/12/2022   Hyponatremia 08/29/2022   Intractable pain 08/27/2022   Acute on chronic pancreatitis (HCC) 02/09/2020   ETOH abuse 02/09/2020   Hemorrhoids 02/09/2020   Portal hypertension (HCC) 02/09/2020   Alcoholic cirrhosis of liver without ascites (HCC) 02/09/2020   Hypokalemia 02/09/2020   Other emphysema (HCC) 12/17/2019   Abdominal pain 01/21/2019   Intractable nausea and vomiting 01/20/2019   Tobacco use disorder 07/04/2018   Alcohol use disorder, moderate, dependence (HCC) 01/01/2018   Alcohol-induced chronic pancreatitis (HCC) 10/24/2015   Essential hypertension 10/16/2015   Alcohol withdrawal syndrome without complication (HCC) 09/15/2015    Past Surgical History:  Procedure Laterality Date   ABDOMINAL SURGERY     ESOPHAGOGASTRODUODENOSCOPY N/A 02/12/2020   Procedure:  ESOPHAGOGASTRODUODENOSCOPY (EGD);  Surgeon: Toledo, Ladell MARLA, MD;  Location: [facility] ENDOSCOPY;  Service: Gastroenterology;  Laterality: N/A;       Home Medications    Prior to Admission medications  Medication Sig Start Date End Date Taking? Authorizing Provider  amLODipine  (NORVASC ) 10 MG tablet Take 10 mg by mouth daily. 02/24/24  Yes [provider]  brompheniramine-pseudoephedrine-DM 30-2-10 MG/5ML syrup Take 10 mLs by mouth 4 (four) times daily as needed for up to 7 days. 03/13/24 03/20/24 Yes Arvis Jolan NOVAK, PA-C  famotidine  (PEPCID ) 20 MG tablet Take 20 mg by mouth 2 (two) times daily. 02/24/24  Yes [provider]  oxyCODONE  (OXY IR/ROXICODONE ) 5 MG immediate release tablet Take 1 tablet (5 mg total) by mouth every 6 (six) hours as needed for moderate pain (pain score 4-6) or severe pain (pain score 7-10). 10/13/23  Yes Bradler, Evan K, MD  NAPOLEON INHUB 500-50 MCG/ACT AEPB  02/24/24  Yes [provider]  albuterol  (VENTOLIN  HFA) 108 (90 Base) MCG/ACT inhaler Inhale 1-2 puffs into the lungs every 6 (six) hours as needed for wheezing or shortness of breath. Patient not taking: Reported on 09/19/2023 06/25/22   Rising, Asberry, PA-C  carvedilol  (COREG ) 6.25 MG tablet Take 1 tablet (6.25 mg total) by mouth 2 (two) times daily with a meal. Patient taking differently: Take 6.25 mg by mouth daily. 11/11/23   Singh, Prashant K, MD  folic acid  (FOLVITE ) 1 MG tablet Take 1 tablet (1 mg total) by mouth daily. Patient not taking: Reported on 11/19/2023 11/11/23   Singh, Prashant K, MD  gabapentin  (NEURONTIN )  300 MG capsule Take 1 capsule (300 mg total) by mouth 3 (three) times daily. Patient not taking: Reported on 11/06/2023 12/18/22 01/17/23  Mardy Elveria DEL, NP  hydrOXYzine  (ATARAX ) 25 MG tablet Take 1 tablet (25 mg total) by mouth 3 (three) times daily as needed for anxiety. Patient not taking: Reported on 09/19/2023 12/18/22   Mardy Elveria DEL, NP  lactulose  (CHRONULAC ) 10  GM/15ML solution Take 45 mLs (30 g total) by mouth 3 (three) times daily. Patient not taking: Reported on 11/19/2023 11/11/23   Singh, Prashant K, MD  lisinopril  (ZESTRIL ) 10 MG tablet Take 10 mg by mouth daily. Patient not taking: Reported on 11/06/2023 09/12/23   [provider]  metFORMIN  (GLUCOPHAGE ) 1000 MG tablet Take 1,000 mg by mouth.  Take 1,000 mg by mouth in the morning and 1,000 mg before bedtime. Patient not taking: Reported on 09/19/2023 03/24/23   [provider]  methocarbamol  (ROBAXIN ) 500 MG tablet Take 500 mg by mouth 3 (three) times daily. Patient not taking: Reported on 09/19/2023 08/30/23   [provider]  nicotine  (NICODERM CQ  - DOSED IN MG/24 HOURS) 14 mg/24hr patch Place 1 patch (14 mg total) onto the skin daily. Patient not taking: Reported on 11/19/2023 11/11/23   Singh, Prashant K, MD  pantoprazole  (PROTONIX ) 40 MG tablet Take 1 tablet (40 mg total) by mouth daily for 14 days. 02/17/24 03/02/24  Ernest Ronal BRAVO, MD  sucralfate  (CARAFATE ) 1 g tablet Take 1 tablet (1 g total) by mouth 4 (four) times daily -  with meals and at bedtime for 14 days. 02/17/24 03/02/24  Ernest Ronal BRAVO, MD  thiamine  (VITAMIN B1) 100 MG tablet Take 1 tablet (100 mg total) by mouth daily. Patient not taking: Reported on 11/19/2023 11/11/23   Dennise Lavada POUR, MD    Family History Family History  Problem Relation Age of Onset   Diabetes Other     Social History Social History[1]   Allergies   Ivp dye [iodinated contrast media]   Review of Systems Review of Systems  Constitutional:  Positive for chills and fatigue. Negative for fever.  HENT:  Positive for congestion and rhinorrhea. Negative for sinus pain and sore throat.   Respiratory:  Positive for cough. Negative for shortness of breath.   Cardiovascular:  Negative for chest pain.  Gastrointestinal:  Negative for abdominal pain, diarrhea, nausea and vomiting.  Musculoskeletal:  Positive for myalgias.  Neurological:   Negative for weakness, light-headedness and headaches.  Hematological:  Negative for adenopathy.     Physical Exam Triage Vital Signs ED Triage Vitals  Encounter Vitals Group     BP      Girls Systolic BP Percentile      Girls Diastolic BP Percentile      Boys Systolic BP Percentile      Boys Diastolic BP Percentile      Pulse      Resp      Temp      Temp src      SpO2      Weight      Height      Head Circumference      Peak Flow      Pain Score      Pain Loc      Pain Education      Exclude from Growth Chart    No data found.  Updated Vital Signs BP (!) 144/83 (BP Location: Left Arm)   Pulse (!) 102   Temp 98.7 F (  37.1 C) (Oral)   Resp 16   Wt 291 lb 12.8 oz (132.4 kg)   SpO2 96%   BMI 40.70 kg/m      Physical Exam Vitals and nursing note reviewed.  Constitutional:      General: He is not in acute distress.    Appearance: Normal appearance. He is well-developed. He is ill-appearing.  HENT:     Head: Normocephalic and atraumatic.     Nose: Congestion and rhinorrhea present.     Mouth/Throat:     Mouth: Mucous membranes are moist.     Pharynx: Oropharynx is clear.  Eyes:     General: No scleral icterus.    Conjunctiva/sclera: Conjunctivae normal.  Cardiovascular:     Rate and Rhythm: Regular rhythm. Tachycardia present.  Pulmonary:     Effort: Pulmonary effort is normal. No respiratory distress.     Breath sounds: Normal breath sounds.  Abdominal:     Palpations: Abdomen is soft.     Tenderness: There is no abdominal tenderness.  Musculoskeletal:     Cervical back: Neck supple.  Skin:    General: Skin is warm and dry.     Capillary Refill: Capillary refill takes less than 2 seconds.  Neurological:     General: No focal deficit present.     Mental Status: He is alert. Mental status is at baseline.     Motor: No weakness.     Gait: Gait normal.  Psychiatric:        Mood and Affect: Mood normal.        Behavior: Behavior normal.       UC Treatments / Results  Labs (all labs ordered are listed, but only abnormal results are displayed) Labs Reviewed  POC SOFIA SARS ANTIGEN FIA - Normal    EKG   Radiology No results found.  Procedures Procedures (including critical care time)  Medications Ordered in UC Medications - No data to display  Initial Impression / Assessment and Plan / UC Course  I have reviewed the triage vital signs and the nursing notes.  Pertinent labs & imaging results that were available during my care of the patient were reviewed by me and considered in my medical decision making (see chart for details).   53 year old male presents for 2-day history of fatigue, nasal congestion and cough.  No fever, sore throat, chest pain or shortness of breath.  Taking OTC meds.  He is afebrile.  Ill-appearing but nontoxic.  On exam has nasal congestion.  Throat is clear.  Chest clear.  Heart regular rhythm with mild tachycardia.  COVID-negative.  Offered flu testing but he declined.  Viral illness prescribed care encouraged with increasing rest and fluids.  Sent Bromfed-DM.  Advised for him to return if fever, worsening productive cough, chest pain, shortness of breath.  Work note given.   Final Clinical Impressions(s) / UC Diagnoses   Final diagnoses:  Viral upper respiratory tract infection  Acute cough  Nasal congestion     Discharge Instructions      URI/COLD SYMPTOMS: Your exam today is consistent with a viral illness. Antibiotics are not indicated at this time. Use medications as directed, including cough syrup, nasal saline, and decongestants. Your symptoms should improve over the next few days and resolve within 7-10 days. Increase rest and fluids. F/u if symptoms worsen or predominate such as sore throat, ear pain, productive cough, shortness of breath, or if you develop high fevers or worsening fatigue over the next several days.  ED Prescriptions     Medication Sig  Dispense Auth. Provider   brompheniramine-pseudoephedrine-DM 30-2-10 MG/5ML syrup Take 10 mLs by mouth 4 (four) times daily as needed for up to 7 days. 150 mL Arvis Jolan NOVAK, PA-C      PDMP not reviewed this encounter.     [1]  Social History Tobacco Use   Smoking status: Every Day    Current packs/day: 1.00    Types: Cigarettes   Smokeless tobacco: Never  Vaping Use   Vaping status: Never Used  Substance Use Topics   Alcohol use: Yes    Alcohol/week: 18.0 standard drinks of alcohol    Types: 9 Cans of beer, 9 Shots of liquor per week    Comment: 5 hard lemonades a day   Drug use: Not Currently    Types: Marijuana, Cocaine     Arvis Jolan NOVAK, PA-C 03/13/24 1622  "
